# Patient Record
Sex: Male | Born: 1950 | Race: White | Hispanic: No | Marital: Married | Smoking: Former smoker
Health system: Southern US, Community
[De-identification: ages and names within clinical notes are randomized; demographics above are authoritative.]

## PROBLEM LIST (undated history)

## (undated) DIAGNOSIS — I10 Essential (primary) hypertension: Secondary | ICD-10-CM

## (undated) DIAGNOSIS — D509 Iron deficiency anemia, unspecified: Secondary | ICD-10-CM

## (undated) DIAGNOSIS — E039 Hypothyroidism, unspecified: Secondary | ICD-10-CM

## (undated) DIAGNOSIS — M545 Low back pain, unspecified: Secondary | ICD-10-CM

## (undated) DIAGNOSIS — H269 Unspecified cataract: Secondary | ICD-10-CM

## (undated) DIAGNOSIS — M810 Age-related osteoporosis without current pathological fracture: Secondary | ICD-10-CM

## (undated) DIAGNOSIS — G4733 Obstructive sleep apnea (adult) (pediatric): Secondary | ICD-10-CM

## (undated) DIAGNOSIS — E079 Disorder of thyroid, unspecified: Secondary | ICD-10-CM

## (undated) DIAGNOSIS — E669 Obesity, unspecified: Secondary | ICD-10-CM

## (undated) DIAGNOSIS — G43909 Migraine, unspecified, not intractable, without status migrainosus: Secondary | ICD-10-CM

## (undated) DIAGNOSIS — T7840XA Allergy, unspecified, initial encounter: Secondary | ICD-10-CM

## (undated) DIAGNOSIS — E559 Vitamin D deficiency, unspecified: Secondary | ICD-10-CM

## (undated) DIAGNOSIS — E785 Hyperlipidemia, unspecified: Secondary | ICD-10-CM

## (undated) HISTORY — DX: Migraine, unspecified, not intractable, without status migrainosus: G43.909

## (undated) HISTORY — DX: Obesity, unspecified: E66.9

## (undated) HISTORY — DX: Disorder of thyroid, unspecified: E07.9

## (undated) HISTORY — DX: Low back pain, unspecified: M54.50

## (undated) HISTORY — PX: BACK SURGERY: SHX140

## (undated) HISTORY — DX: Vitamin D deficiency, unspecified: E55.9

## (undated) HISTORY — DX: Allergy, unspecified, initial encounter: T78.40XA

## (undated) HISTORY — DX: Low back pain: M54.5

## (undated) HISTORY — DX: Age-related osteoporosis without current pathological fracture: M81.0

## (undated) HISTORY — DX: Unspecified cataract: H26.9

## (undated) HISTORY — DX: Essential (primary) hypertension: I10

## (undated) HISTORY — DX: Hyperlipidemia, unspecified: E78.5

## (undated) HISTORY — DX: Iron deficiency anemia, unspecified: D50.9

---

## 1983-06-19 HISTORY — PX: LAMINECTOMY: SHX219

## 1983-06-19 HISTORY — PX: SPINE SURGERY: SHX786

## 2005-06-14 ENCOUNTER — Ambulatory Visit: Payer: Self-pay | Admitting: Specialist

## 2005-06-14 IMAGING — CR DG ABDOMEN 1V
1 series · 1 of 1 positions shown · non-contrast
Comparison: none

REASON FOR EXAM: Renal calculi-lithotripsy
COMMENTS:

[view not recorded]
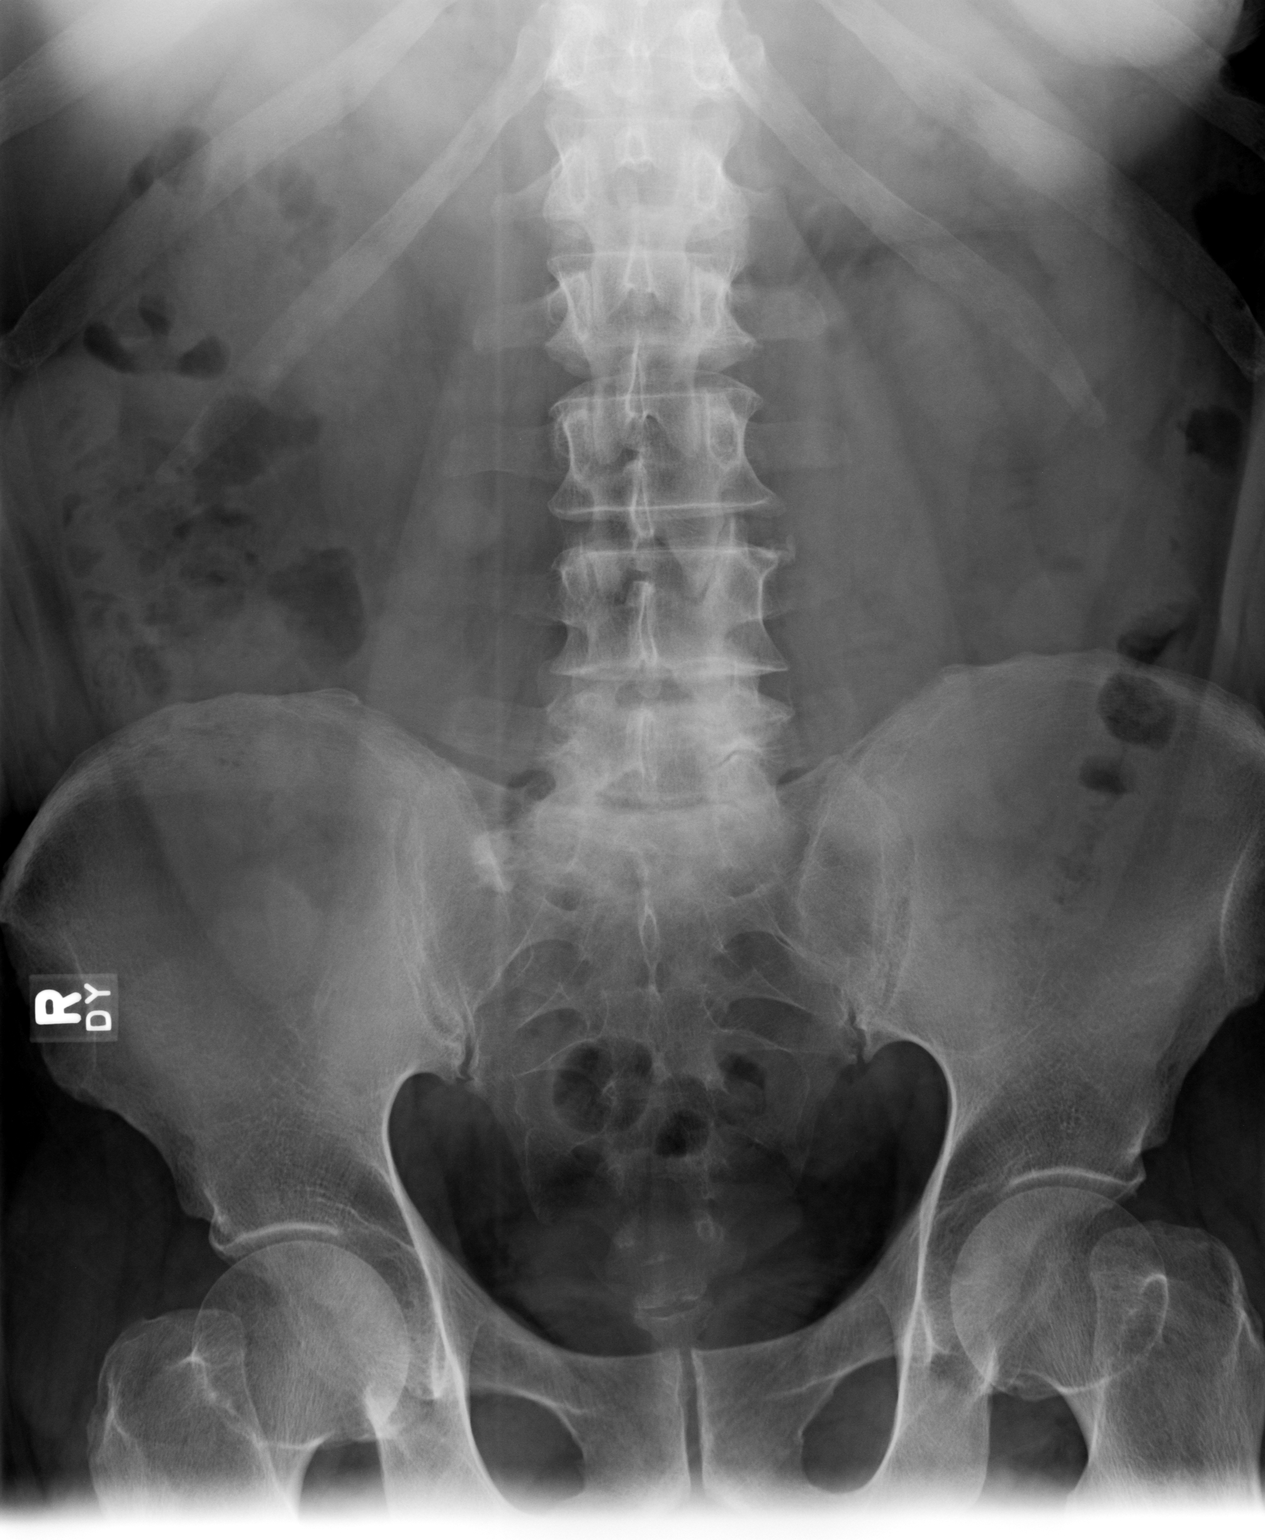

[1 of 1 positions shown; findings below may reference images not displayed]

PROCEDURE:     DXR - DXR KIDNEY URETER BLADDER  - [DATE]  [DATE]

RESULT:     There are no prior radiographs available for comparison.

No definite renal calcifications are seen. There is a 1.6 cm density
projected over the sacrum on the RIGHT. On the basis of this exam alone, it
is uncertain as to whether this represents a distal RIGHT ureteral stone or
a sclerotic density in the sacrum.
IMPRESSION: Possible distal RIGHT ureteral stone versus a sclerotic
density in the RIGHT sacrum.

## 2006-04-12 ENCOUNTER — Ambulatory Visit: Payer: Self-pay | Admitting: Gastroenterology

## 2006-07-22 ENCOUNTER — Ambulatory Visit: Payer: Self-pay | Admitting: Specialist

## 2006-07-22 IMAGING — CT CT STONE STUDY
1 of 2 series · 15 of 32 positions shown, 19 images · non-contrast
Comparison: none

REASON FOR EXAM: history kidney stones f/u lithotripsy  send copy of disc
with patient
COMMENTS:

[Series 2: stone · axial · 0.77mm/px · z∈[-418,+5]mm · 15 of 159 slices shown, 19 images]
[im 12/159  soft-tissue]
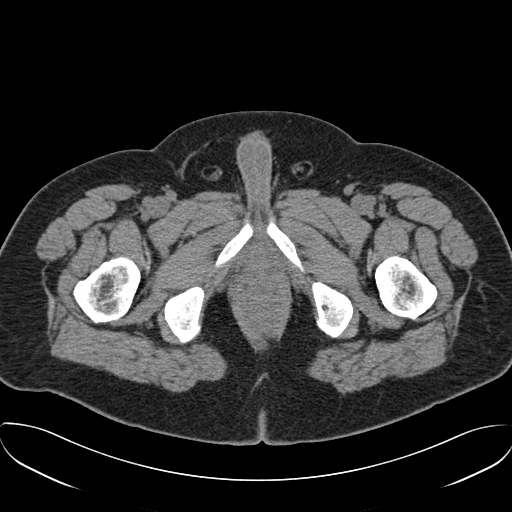
[im 12/159  bone]
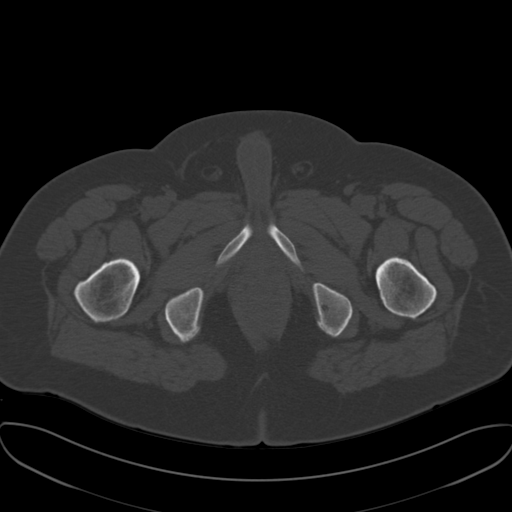
[im 23/159  soft-tissue]
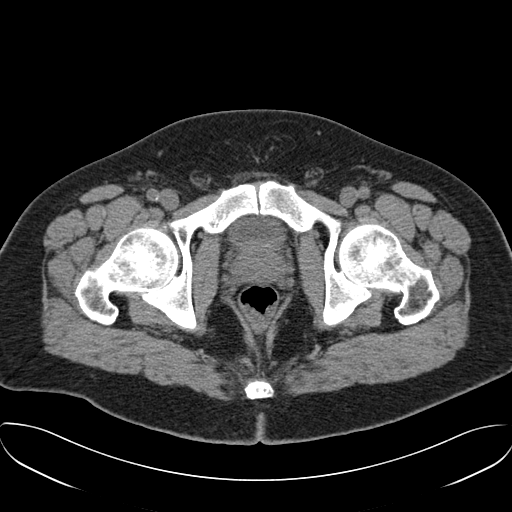
[im 34/159  soft-tissue]
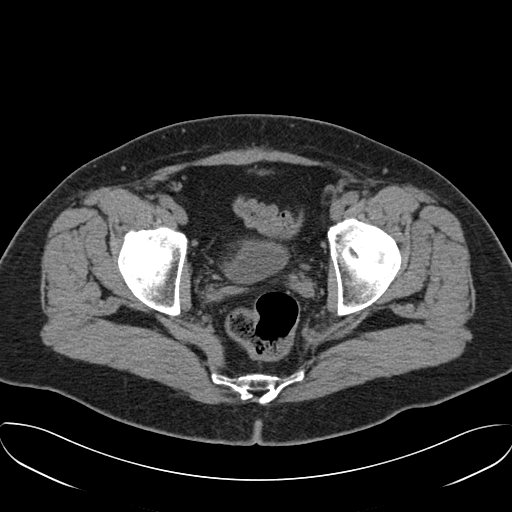
[im 46/159  soft-tissue]
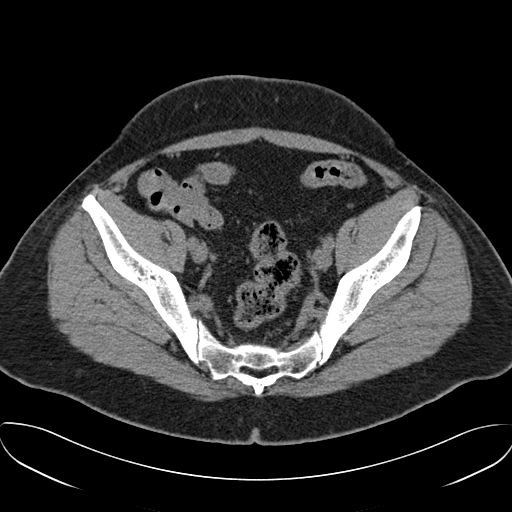
[im 57/159  soft-tissue]
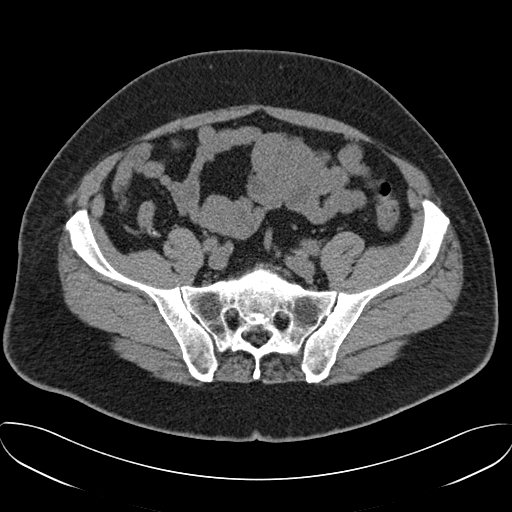
[im 68/159  soft-tissue]
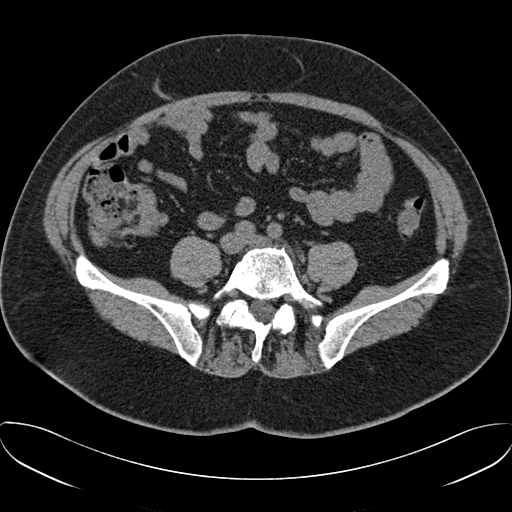
[im 80/159  soft-tissue]
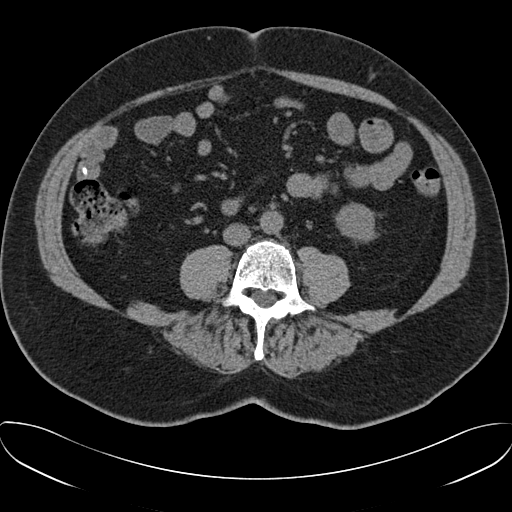
[im 91/159  soft-tissue]
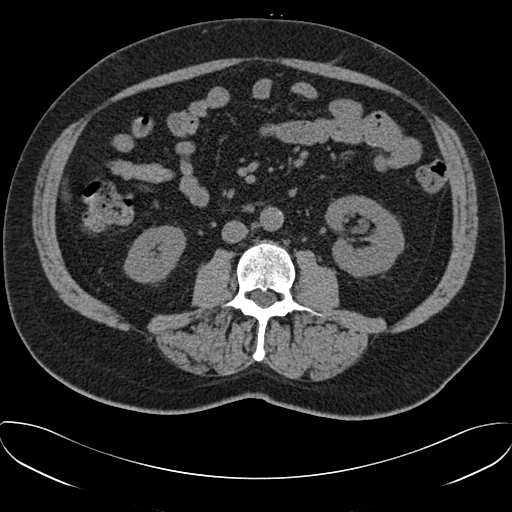
[im 102/159  soft-tissue]
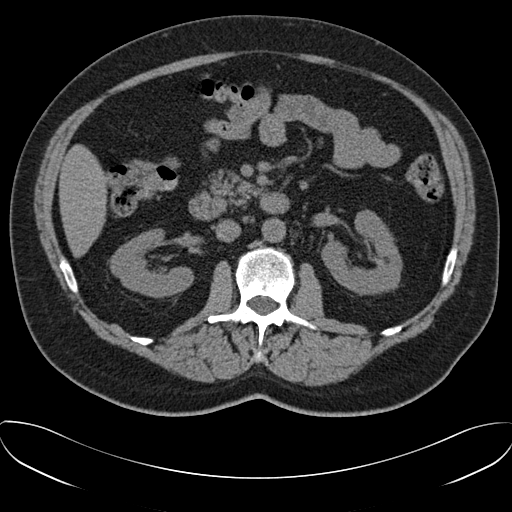
[im 102/159  bone]
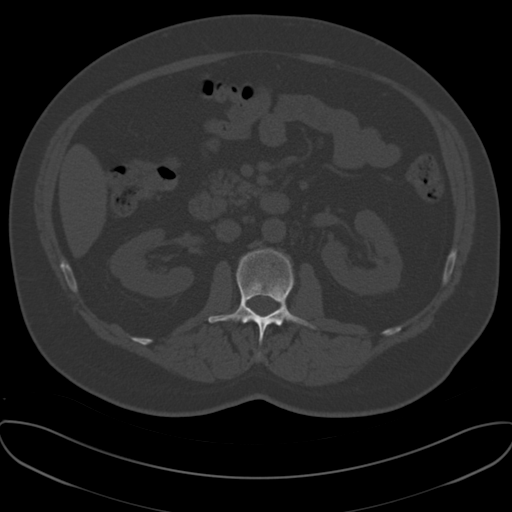
[im 113/159  soft-tissue]
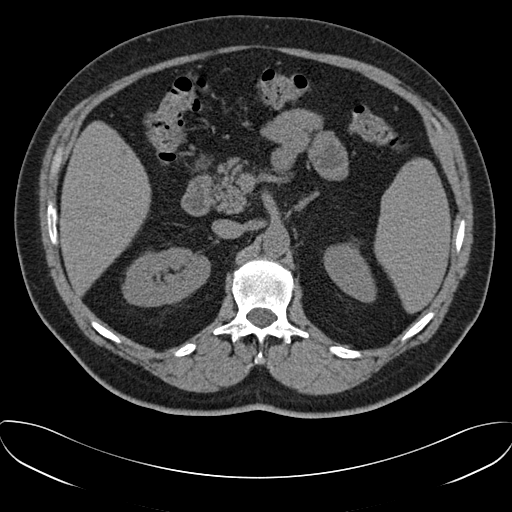
[im 125/159  soft-tissue]
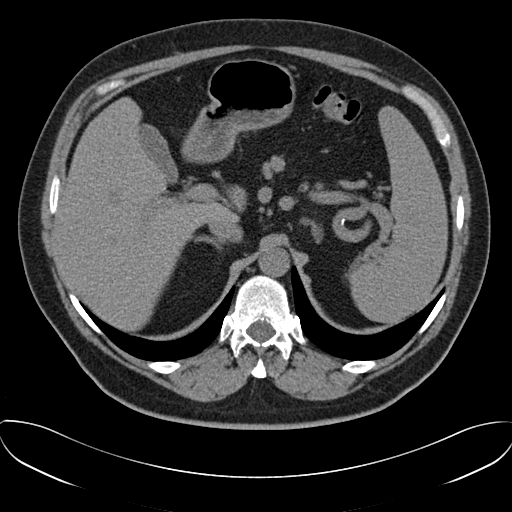
[im 136/159  soft-tissue]
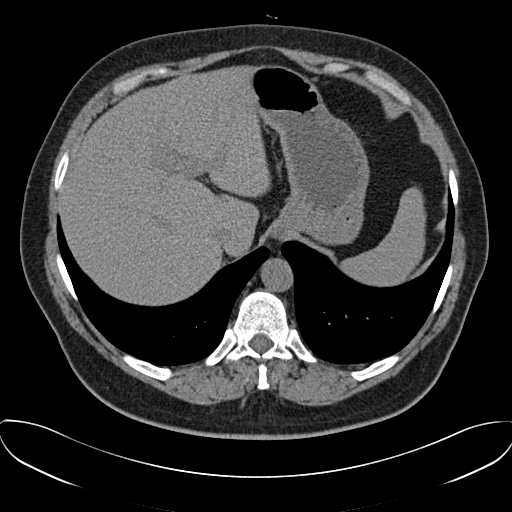
[im 136/159  lung]
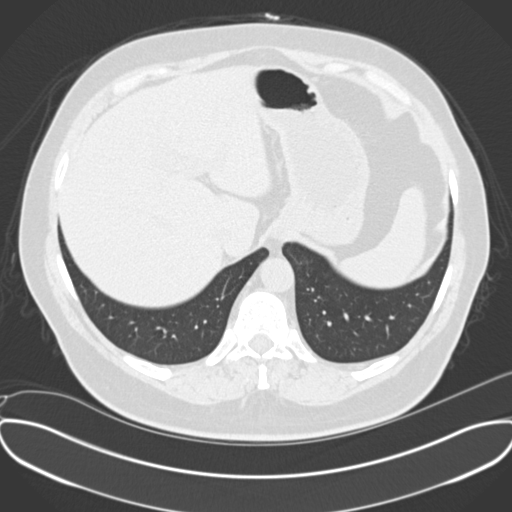
[im 142/159  lung]
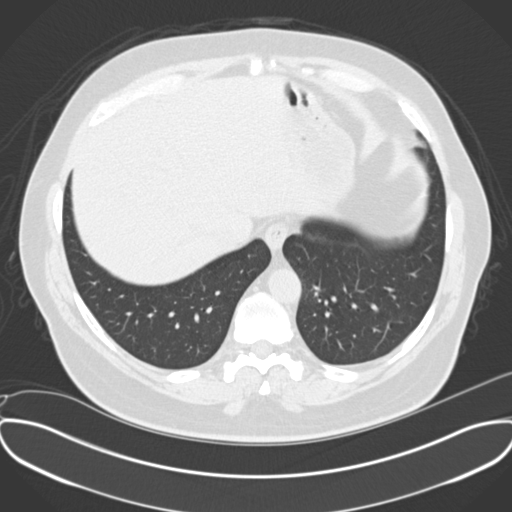
[im 147/159  soft-tissue]
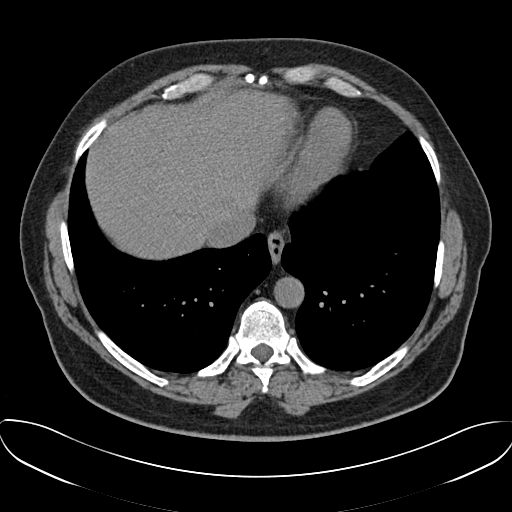
[im 147/159  lung]
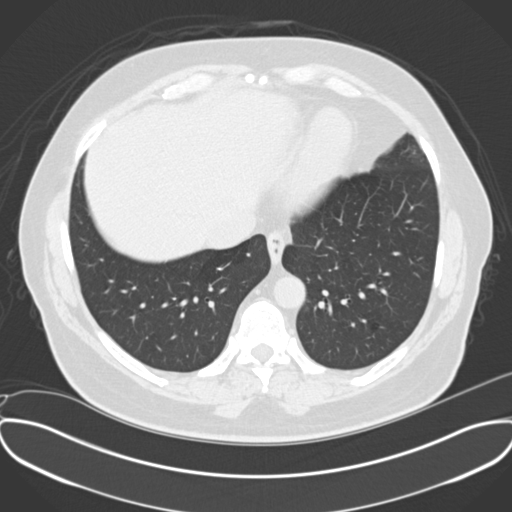
[im 153/159  lung]
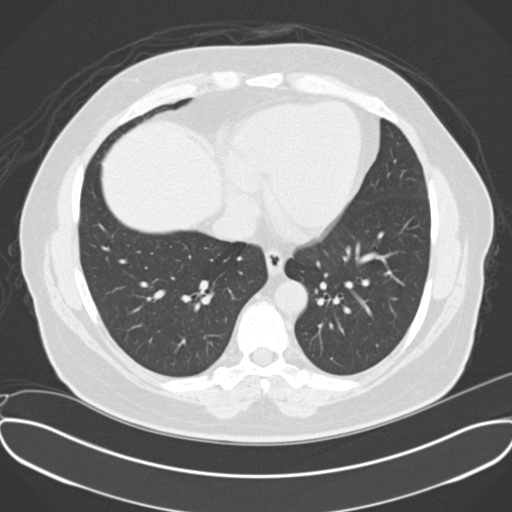

[15 of 32 positions shown; findings below may reference images not displayed]

PROCEDURE:     CT  - CT ABDOMEN /PELVIS WO (STONE)  - [DATE]  [DATE]

RESULT:     The patient has a history of urinary tract stones and has
undergone lithotripsy.

This non-contrast study reveals the kidneys to be normal in density and
contour.  I see no calcified stones.  On the LEFT in the mid pole there is
an approximately 7 mm diameter hypodensity with Hounsfield measurement of
-89 most compatible with an angiomyolipoma.  The perinephric fat is normal
in appearance. I see no calcified stones within either kidney and no
evidence of hydronephrosis.  There is no hydroureter either.  There is a
faint calcification noted at the LEFT ureterovesical junction, which may
reflect Steinstrasse, but no obstruction is identified. The partially
distended urinary bladder is normal in appearance. The RIGHT UVJ appears
normal. There is no free fluid in the pelvis and the sigmoid colon and
seminal vesicles appeared normal.  The prostate gland produces a very
minimal impression upon the urinary bladder base.

The unopacified loops of small and large bowel are normal in appearance. The
caliber of the abdominal aorta is normal.  The adrenal glands, spleen,
stomach, pancreas, and liver are normal in appearance. The gallbladder is
only partially distended.  There is likely a tiny cyst inferoanteriorly in
the RIGHT lobe of the liver measuring approximately 8 mm in diameter.  It
has a Hounsfield measurement of -5.  The lung bases are clear.
IMPRESSION: 1)There is calcification in the region of the LEFT ureterovesical junction
that likely reflects Steinstrasse, or other very tiny sub mm stones.

2)There is no evidence of urinary tract obstruction and I see no stones
within either kidney currently.

3)There is likely a benign fatty containing lesion in the mid pole of the
LEFT kidney consistent with an angiomyolipoma.

4)I see no acute abnormality elsewhere within the abdomen or pelvis.

## 2007-05-05 DIAGNOSIS — M810 Age-related osteoporosis without current pathological fracture: Secondary | ICD-10-CM

## 2007-08-08 ENCOUNTER — Ambulatory Visit: Payer: Self-pay | Admitting: Specialist

## 2007-08-08 IMAGING — CT CT STONE STUDY
1 of 2 series · 16 of 32 positions shown, 20 images · non-contrast
Comparison: none

REASON FOR EXAM: f/u for kidney stones
COMMENTS:

PROCEDURE:     CT  - CT ABDOMEN /PELVIS WO (STONE)  - [DATE]  [DATE]
RESULT:
HISTORY: Stone disease.

[Series 2: stone · axial · 0.77mm/px · z∈[-974,-526]mm · 16 of 161 slices shown, 20 images]
[im 6/161  soft-tissue]
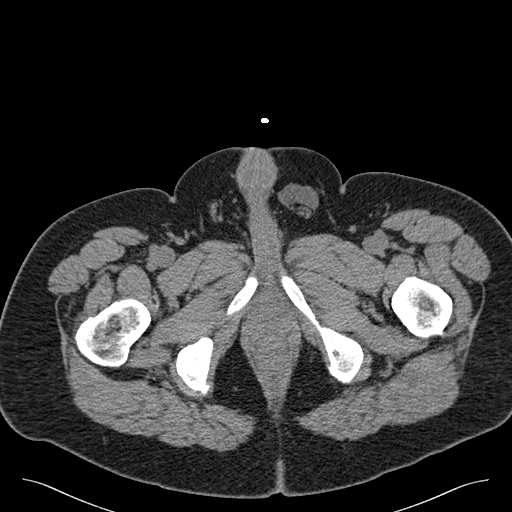
[im 6/161  bone]
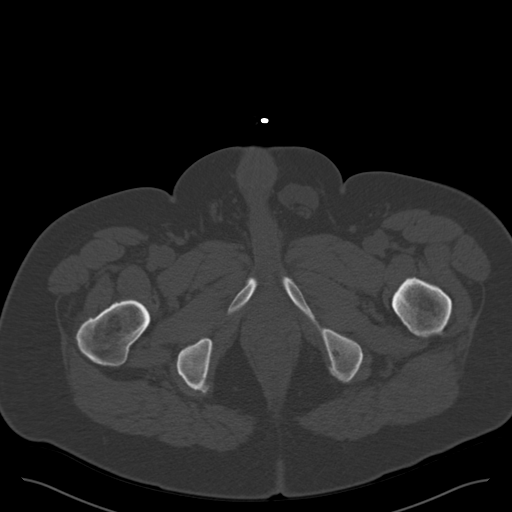
[im 18/161  soft-tissue]
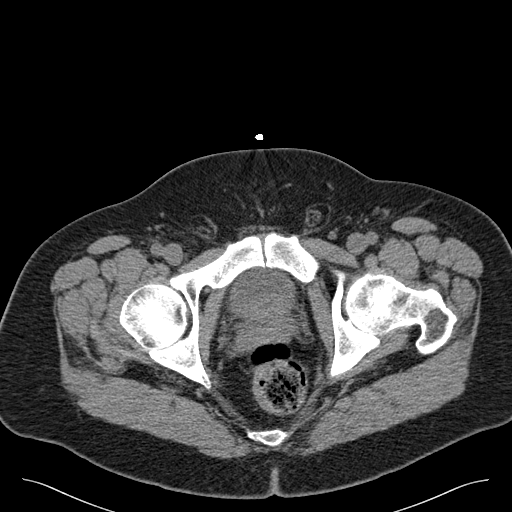
[im 29/161  soft-tissue]
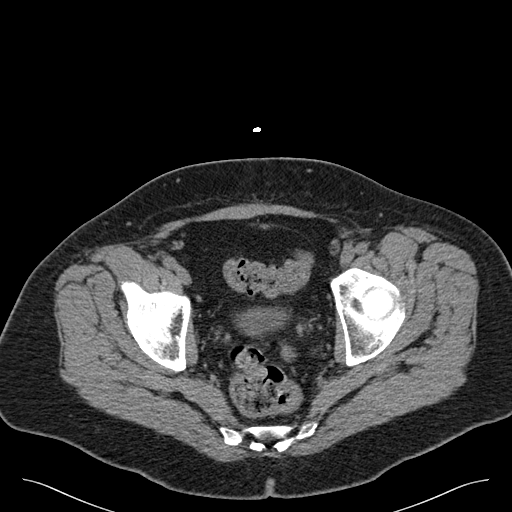
[im 41/161  soft-tissue]
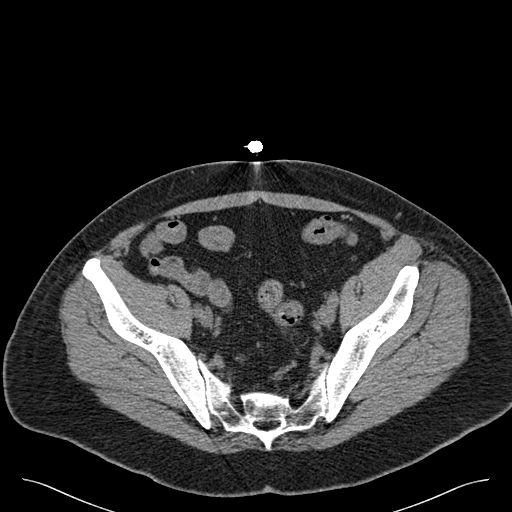
[im 52/161  soft-tissue]
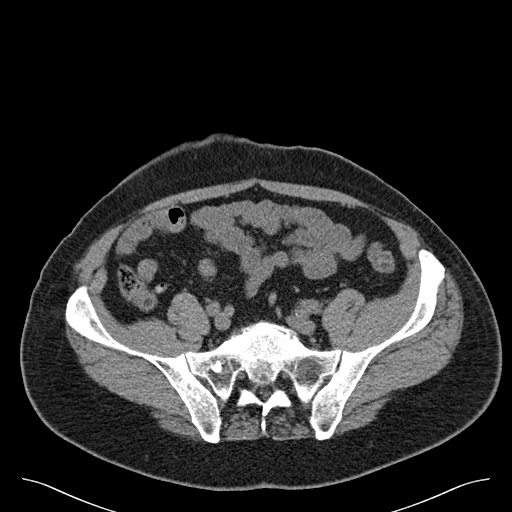
[im 63/161  soft-tissue]
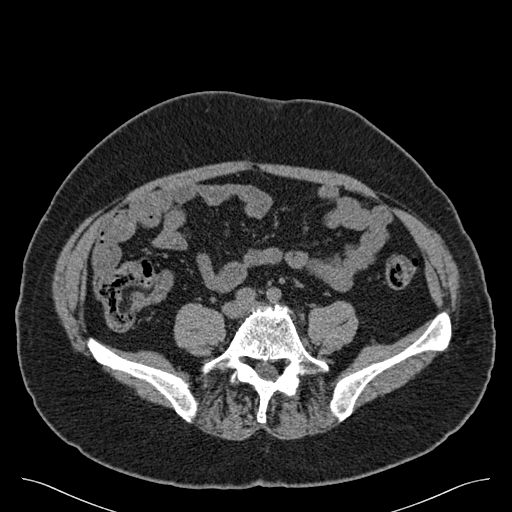
[im 75/161  soft-tissue]
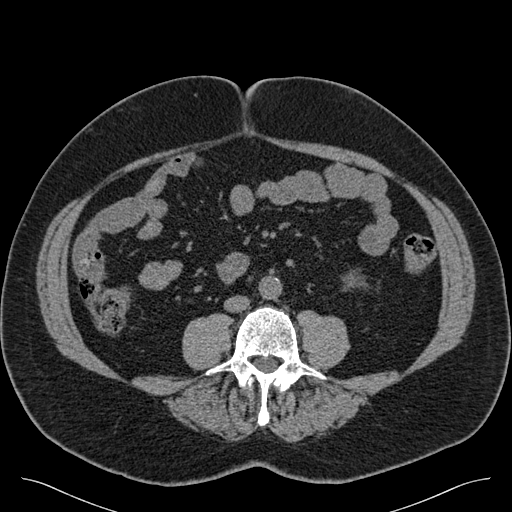
[im 86/161  soft-tissue]
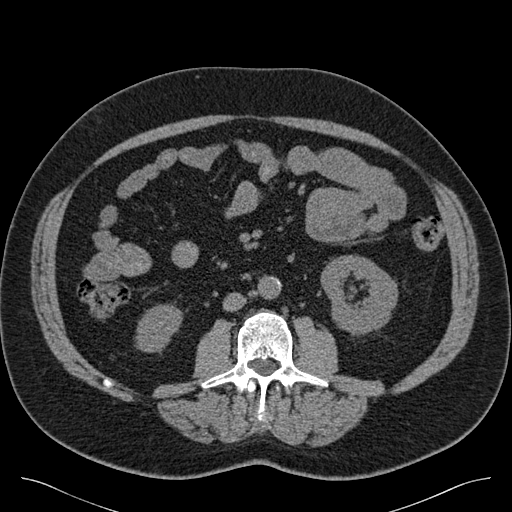
[im 98/161  soft-tissue]
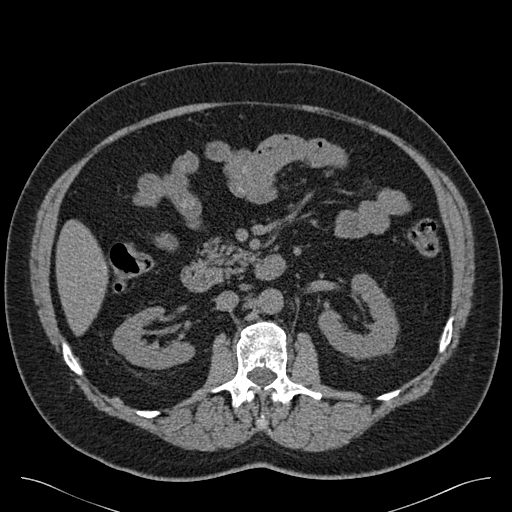
[im 98/161  bone]
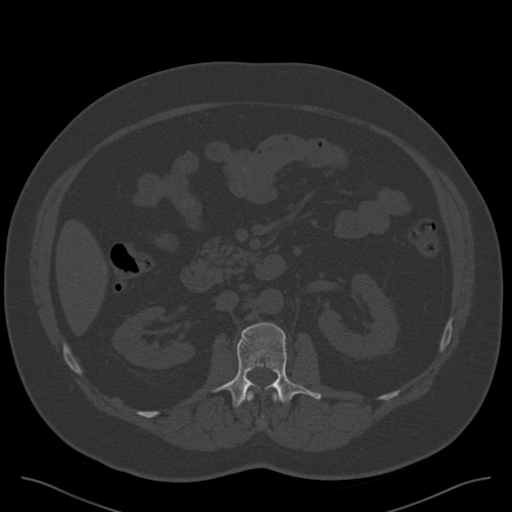
[im 109/161  soft-tissue]
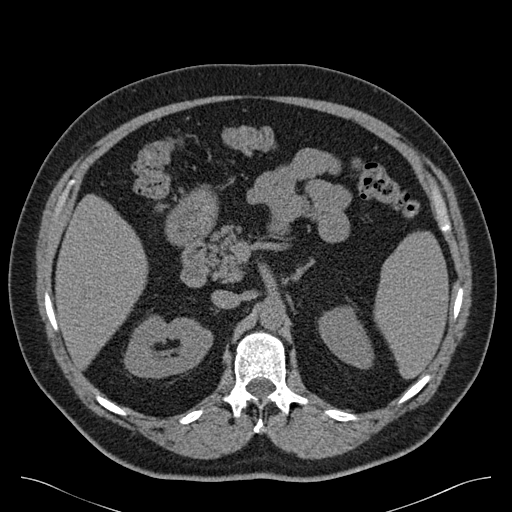
[im 121/161  soft-tissue]
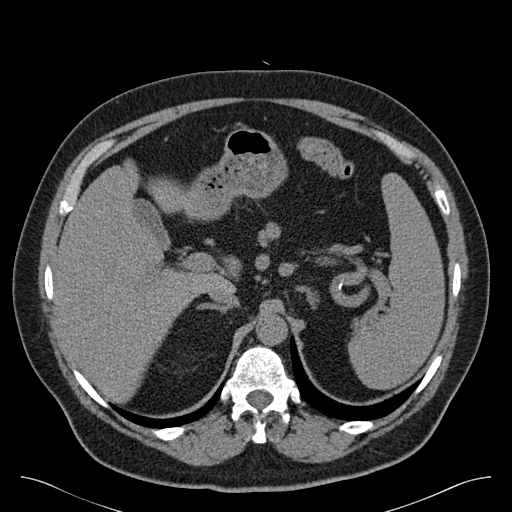
[im 132/161  soft-tissue]
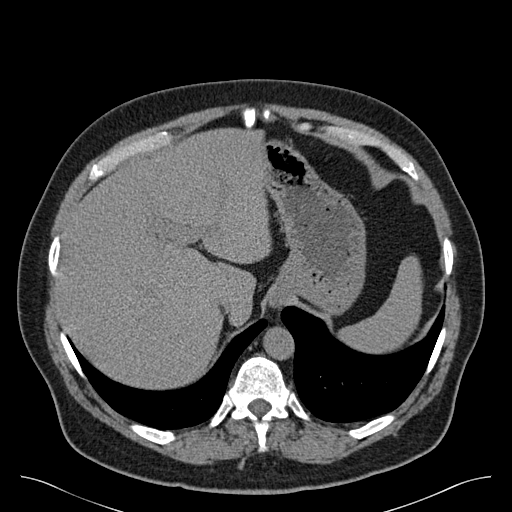
[im 138/161  lung]
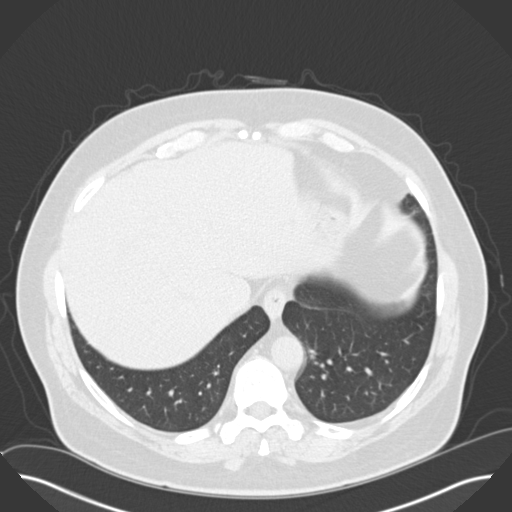
[im 143/161  soft-tissue]
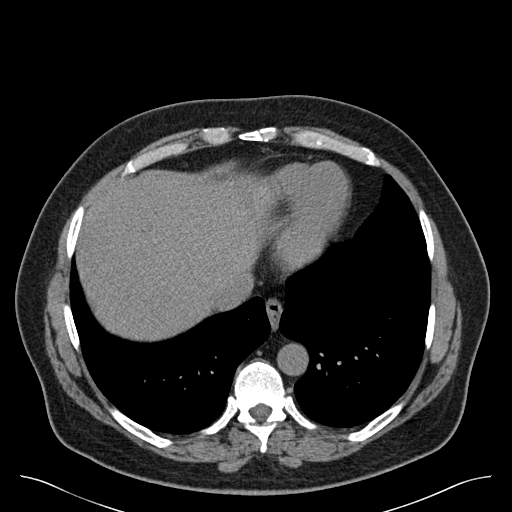
[im 143/161  lung]
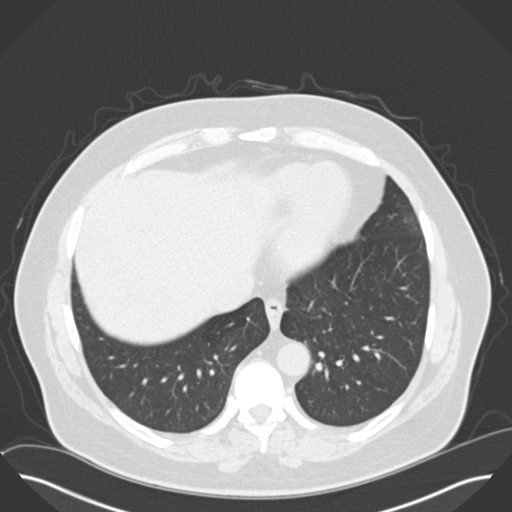
[im 149/161  lung]
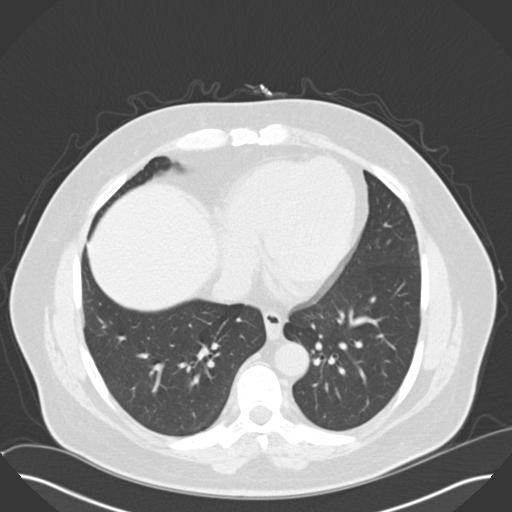
[im 155/161  soft-tissue]
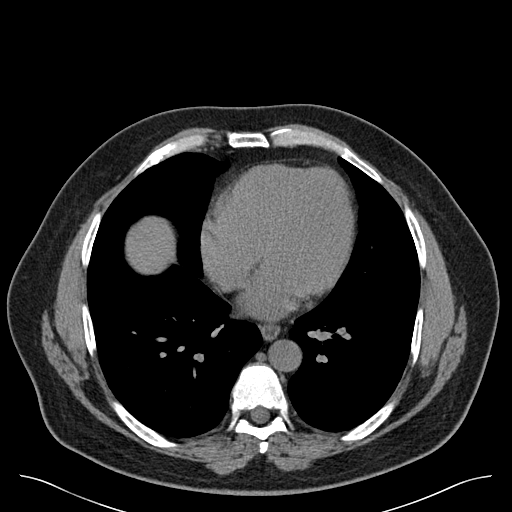
[im 155/161  lung]
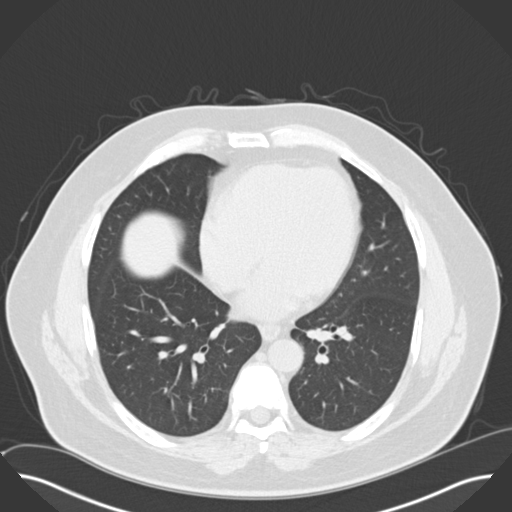

[16 of 32 positions shown; findings below may reference images not displayed]

COMPARISON STUDIES:  CT of [DATE].

Liver and spleen are unremarkable.  A tiny cyst is noted in the LEFT lobe of
the liver.  Pancreas is normal.  The adrenals are normal.  Tiny
angiomyolipoma is noted in the LEFT kidney.  Previously-questioned
steinstrasse in the LEFT distal ureter/ureterovesical junction is less
evident on today's examination.  There is no evidence of ureteral
distention.  Bladder is normal.  No inguinal adenopathy noted.  Prostate is
mildly prominent.
IMPRESSION: 1.     Previously-identified steinstrasse in the distal LEFT ureter is less
evident on today's examination.  Stone fragments may have passed.  There is
no evidence of ureteral obstruction.
2.     Tiny angiomyolipoma, LEFT kidney.

## 2007-09-12 DIAGNOSIS — R809 Proteinuria, unspecified: Secondary | ICD-10-CM | POA: Insufficient documentation

## 2009-04-14 DIAGNOSIS — E559 Vitamin D deficiency, unspecified: Secondary | ICD-10-CM | POA: Insufficient documentation

## 2010-04-03 DIAGNOSIS — M545 Low back pain, unspecified: Secondary | ICD-10-CM | POA: Insufficient documentation

## 2010-04-24 DIAGNOSIS — D239 Other benign neoplasm of skin, unspecified: Secondary | ICD-10-CM

## 2010-04-24 HISTORY — DX: Other benign neoplasm of skin, unspecified: D23.9

## 2011-08-15 ENCOUNTER — Ambulatory Visit: Payer: Self-pay | Admitting: Gastroenterology

## 2011-08-16 LAB — HM COLONOSCOPY: HM Colonoscopy: NORMAL

## 2013-09-14 LAB — HM DEXA SCAN

## 2014-04-06 LAB — LIPID PANEL
CHOLESTEROL: 145 mg/dL (ref 0–200)
HDL: 45 mg/dL (ref 35–70)
LDL Cholesterol: 87 mg/dL
TRIGLYCERIDES: 64 mg/dL (ref 40–160)

## 2014-04-06 LAB — HEMOGLOBIN A1C: Hgb A1c MFr Bld: 5.1 % (ref 4.0–6.0)

## 2014-05-03 LAB — PSA: PSA: 0.7

## 2014-06-18 HISTORY — PX: EYE SURGERY: SHX253

## 2014-12-03 ENCOUNTER — Encounter: Payer: Self-pay | Admitting: Family Medicine

## 2014-12-03 DIAGNOSIS — M222X9 Patellofemoral disorders, unspecified knee: Secondary | ICD-10-CM | POA: Insufficient documentation

## 2014-12-04 ENCOUNTER — Other Ambulatory Visit: Payer: Self-pay | Admitting: Family Medicine

## 2014-12-21 ENCOUNTER — Other Ambulatory Visit: Payer: Self-pay | Admitting: Family Medicine

## 2014-12-30 ENCOUNTER — Encounter: Payer: Self-pay | Admitting: Family Medicine

## 2014-12-30 DIAGNOSIS — G43909 Migraine, unspecified, not intractable, without status migrainosus: Secondary | ICD-10-CM | POA: Insufficient documentation

## 2014-12-30 DIAGNOSIS — R7989 Other specified abnormal findings of blood chemistry: Secondary | ICD-10-CM | POA: Insufficient documentation

## 2014-12-30 DIAGNOSIS — R945 Abnormal results of liver function studies: Secondary | ICD-10-CM | POA: Insufficient documentation

## 2014-12-30 DIAGNOSIS — E291 Testicular hypofunction: Secondary | ICD-10-CM | POA: Insufficient documentation

## 2014-12-30 DIAGNOSIS — J3089 Other allergic rhinitis: Secondary | ICD-10-CM

## 2014-12-30 DIAGNOSIS — E785 Hyperlipidemia, unspecified: Secondary | ICD-10-CM | POA: Insufficient documentation

## 2014-12-30 DIAGNOSIS — I10 Essential (primary) hypertension: Secondary | ICD-10-CM | POA: Insufficient documentation

## 2014-12-30 DIAGNOSIS — M1990 Unspecified osteoarthritis, unspecified site: Secondary | ICD-10-CM | POA: Insufficient documentation

## 2014-12-30 DIAGNOSIS — E039 Hypothyroidism, unspecified: Secondary | ICD-10-CM | POA: Insufficient documentation

## 2014-12-30 DIAGNOSIS — J302 Other seasonal allergic rhinitis: Secondary | ICD-10-CM | POA: Insufficient documentation

## 2014-12-30 DIAGNOSIS — E669 Obesity, unspecified: Secondary | ICD-10-CM | POA: Insufficient documentation

## 2014-12-30 DIAGNOSIS — E8881 Metabolic syndrome: Secondary | ICD-10-CM | POA: Insufficient documentation

## 2014-12-31 ENCOUNTER — Encounter: Payer: Self-pay | Admitting: Family Medicine

## 2014-12-31 ENCOUNTER — Ambulatory Visit (INDEPENDENT_AMBULATORY_CARE_PROVIDER_SITE_OTHER): Payer: Managed Care, Other (non HMO) | Admitting: Family Medicine

## 2014-12-31 VITALS — BP 128/62 | HR 73 | Temp 98.6°F | Resp 18 | Ht 70.5 in | Wt 237.1 lb

## 2014-12-31 DIAGNOSIS — E8881 Metabolic syndrome: Secondary | ICD-10-CM | POA: Diagnosis not present

## 2014-12-31 DIAGNOSIS — E039 Hypothyroidism, unspecified: Secondary | ICD-10-CM | POA: Diagnosis not present

## 2014-12-31 DIAGNOSIS — E291 Testicular hypofunction: Secondary | ICD-10-CM

## 2014-12-31 DIAGNOSIS — E785 Hyperlipidemia, unspecified: Secondary | ICD-10-CM | POA: Diagnosis not present

## 2014-12-31 DIAGNOSIS — I10 Essential (primary) hypertension: Secondary | ICD-10-CM

## 2014-12-31 MED ORDER — METFORMIN HCL 500 MG PO TABS: 500.0000 mg | ORAL_TABLET | Freq: Every day | ORAL | Status: DC

## 2014-12-31 MED ORDER — TESTOSTERONE 20.25 MG/ACT (1.62%) TD GEL: 2.0000 | Freq: Every day | TRANSDERMAL | Status: DC

## 2014-12-31 MED ORDER — TRIAMTERENE-HCTZ 37.5-25 MG PO TABS: 1.0000 | ORAL_TABLET | Freq: Every day | ORAL | Status: DC

## 2014-12-31 MED ORDER — VERAPAMIL HCL ER 300 MG PO CP24: 1.0000 | ORAL_CAPSULE | Freq: Every day | ORAL | Status: DC

## 2014-12-31 MED ORDER — ROSUVASTATIN CALCIUM 20 MG PO TABS: 20.0000 mg | ORAL_TABLET | Freq: Every day | ORAL | Status: DC

## 2014-12-31 MED ORDER — AMLODIPINE-OLMESARTAN 5-40 MG PO TABS: 1.0000 | ORAL_TABLET | Freq: Every day | ORAL | Status: DC

## 2014-12-31 NOTE — Progress Notes (Signed)
Name: Alan Campbell   [name]: [id number]    DOB: 13-Dec-1950   Date:12/31/2014       Progress Note  Subjective  Chief Complaint  Chief Complaint  Patient presents with  . Medication Refill    6 month F/U  . Hypertension  . Hyperlipidemia  . Hypothyroidism  . Metabolic Syndrome    Trying to watch what patient eats    HPI  Hypothyroidism: patient has been taking Synthroid 200 mcg one daily Monday through Saturdays and taking half pill on Sunday. Denies palpitation, constipation, or dry skin  HTN: he is compliant with medication, he continues to have some lower extremity edema.  BP has been at goal, denies chest pain or SOB  Hyperlipidemia: taking Crestor and denies side effects.   Metabolic Syndrome: HYQM5H is at goal, taking only one Metformin daily , had diarrhea with two daily medication  Patient Active Problem List   Diagnosis Date Noted  . Benign essential HTN 12/30/2014  . Dyslipidemia 12/30/2014  . Adult hypothyroidism 12/30/2014  . Dysmetabolic syndrome 84/69/6295  . Headache, migraine 12/30/2014  . Obesity (BMI 30-39.9) 12/30/2014  . Arthritis, degenerative 12/30/2014  . Allergic rhinitis 12/30/2014  . 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  . Patella-femoral syndrome 12/03/2014  . Low back pain 04/03/2010  . Vitamin D deficiency 04/14/2009  . Abnormal presence of protein in urine 09/12/2007  . OP (osteoporosis) 05/05/2007    Past Surgical History  Procedure Laterality Date  . Laminectomy  1985    L-5    Family History  Problem Relation Age of Onset  . Heart disease Father   . Hypothyroidism Sister   . Hypothyroidism Sister   . Migraines Sister     History   Social History  . Marital Status: Married    Spouse Name: N/A  . Number of Children: N/A  . Years of Education: N/A   Occupational History  . Not on file.   Social History Main Topics  . Smoking status: Never Smoker   . Smokeless tobacco: Never Used  . Alcohol Use: 2.4 oz/week    4 Standard  drinks or equivalent per week  . Drug Use: No  . Sexual Activity:    Partners: Female   Other Topics Concern  . Not on file   Social History Narrative     Current outpatient prescriptions:  .  amLODipine-olmesartan (AZOR) 5-40 MG per tablet, Take 1 tablet by mouth daily., Disp: 30 tablet, Rfl: 5 .  aspirin 81 MG chewable tablet, Chew 1 tablet by mouth daily., Disp: , Rfl:  .  metFORMIN (GLUCOPHAGE) 500 MG tablet, Take 1 tablet (500 mg total) by mouth daily., Disp: 30 tablet, Rfl: 5 .  Multiple Vitamins-Iron (MULTIPLE VITAMIN/IRON PO), Take 1 tablet by mouth daily., Disp: , Rfl:  .  OMEGA-3 FATTY ACIDS PO, Take 1 tablet by mouth daily., Disp: , Rfl:  .  rosuvastatin (CRESTOR) 20 MG tablet, Take 1 tablet (20 mg total) by mouth daily., Disp: 30 tablet, Rfl: 5 .  SYNTHROID 200 MCG tablet, TAKE 1 AND 1/2 TABLETS BY MOUTH ON SUNDAY, AND 1 TABLET DAILY ON ALL OTHER DAYS., Disp: 32 tablet, Rfl: 0 .  Testosterone ([name] PUMP) 20.25 MG/ACT (1.62%) GEL, Apply 2 Squirts topically daily., Disp: 150 g, Rfl: 5 .  triamterene-hydrochlorothiazide (MAXZIDE-25) 37.5-25 MG per tablet, Take 1 tablet by mouth daily., Disp: 30 tablet, Rfl: 5 .  Verapamil HCl CR 300 MG CP24, Take 1 capsule (300 mg total) by mouth daily., Disp: 30 each,  Rfl: 5  No Known Allergies   ROS  Constitutional: Negative for fever or significant weight change.  Respiratory: Negative for cough and shortness of breath.   Cardiovascular: Negative for chest pain or palpitations.  Gastrointestinal: Negative for abdominal pain, no bowel changes.  Musculoskeletal: Negative for gait problem or joint swelling.  Skin: Negative for rash.  Neurological: Negative for dizziness or headache.  No other specific complaints in a complete review of systems (except as listed in HPI above).  Objective  Filed Vitals:   12/31/14 1639  BP: 128/62  Pulse: 73  Temp: 98.6 F (37 C)  TempSrc: Oral  Resp: 18  Height: 5' 10.5" (1.791 m)  Weight:  237 lb 1.6 oz (107.548 kg)  SpO2: 96%    Body mass index is 33.53 kg/(m^2).  Physical Exam  Constitutional: Patient appears well-developed and well-nourished. Obese Yes No distress.  Eyes:  No scleral icterus. PERL Neck: Normal range of motion. Neck supple. Cardiovascular: Normal rate, regular rhythm and normal heart sounds.  No murmur heard. He has  BLE edema. Pulmonary/Chest: Effort normal and breath sounds normal. No respiratory distress. Abdominal: Soft.  There is no tenderness. Psychiatric: Patient has a normal mood and affect. behavior is normal. Judgment and thought content normal.    PHQ2/9: Depression screen PHQ 2/9 12/31/2014  Decreased Interest 0  Down, Depressed, Hopeless 0  PHQ - 2 Score 0    Fall Risk: Fall Risk  12/31/2014  Falls in the past year? No     Assessment & Plan  1. Hypothyroidism, adult  - TSH  2. Benign essential HTN  - Verapamil HCl CR 300 MG CP24; Take 1 capsule (300 mg total) by mouth daily.  Dispense: 30 each; Refill: 5 - triamterene-hydrochlorothiazide (MAXZIDE-25) 37.5-25 MG per tablet; Take 1 tablet by mouth daily.  Dispense: 30 tablet; Refill: 5 - amLODipine-olmesartan (AZOR) 5-40 MG per tablet; Take 1 tablet by mouth daily.  Dispense: 30 tablet; Refill: 5  3. Dyslipidemia  - rosuvastatin (CRESTOR) 20 MG tablet; Take 1 tablet (20 mg total) by mouth daily.  Dispense: 30 tablet; Refill: 5  4. Dysmetabolic syndrome  - metFORMIN (GLUCOPHAGE) 500 MG tablet; Take 1 tablet (500 mg total) by mouth daily.  Dispense: 30 tablet; Refill: 5  5. Testicular hypofunction  - Testosterone ([name] PUMP) 20.25 MG/ACT (1.62%) GEL; Apply 2 Squirts topically daily.  Dispense: 150 g; Refill: 5

## 2015-01-14 LAB — TSH: TSH: 1.55 u[IU]/mL (ref 0.450–4.500)

## 2015-01-16 ENCOUNTER — Other Ambulatory Visit: Payer: Self-pay | Admitting: Family Medicine

## 2015-01-17 ENCOUNTER — Other Ambulatory Visit: Payer: Self-pay | Admitting: Family Medicine

## 2015-01-17 MED ORDER — SYNTHROID 200 MCG PO TABS: 200.0000 ug | ORAL_TABLET | Freq: Every day | ORAL | Status: DC

## 2015-04-07 ENCOUNTER — Encounter: Payer: Self-pay | Admitting: Family Medicine

## 2015-05-06 ENCOUNTER — Ambulatory Visit (INDEPENDENT_AMBULATORY_CARE_PROVIDER_SITE_OTHER): Payer: Managed Care, Other (non HMO) | Admitting: Family Medicine

## 2015-05-06 ENCOUNTER — Encounter: Payer: Self-pay | Admitting: Family Medicine

## 2015-05-06 VITALS — BP 126/84 | HR 68 | Temp 97.9°F | Resp 18 | Ht 70.5 in | Wt 233.0 lb

## 2015-05-06 DIAGNOSIS — Z79899 Other long term (current) drug therapy: Secondary | ICD-10-CM

## 2015-05-06 DIAGNOSIS — E039 Hypothyroidism, unspecified: Secondary | ICD-10-CM

## 2015-05-06 DIAGNOSIS — E8881 Metabolic syndrome: Secondary | ICD-10-CM | POA: Diagnosis not present

## 2015-05-06 DIAGNOSIS — E291 Testicular hypofunction: Secondary | ICD-10-CM | POA: Diagnosis not present

## 2015-05-06 DIAGNOSIS — I10 Essential (primary) hypertension: Secondary | ICD-10-CM | POA: Diagnosis not present

## 2015-05-06 DIAGNOSIS — Z23 Encounter for immunization: Secondary | ICD-10-CM

## 2015-05-06 DIAGNOSIS — Z125 Encounter for screening for malignant neoplasm of prostate: Secondary | ICD-10-CM | POA: Diagnosis not present

## 2015-05-06 DIAGNOSIS — Z0000 Encounter for general adult medical examination without abnormal findings: Secondary | ICD-10-CM

## 2015-05-06 DIAGNOSIS — M810 Age-related osteoporosis without current pathological fracture: Secondary | ICD-10-CM | POA: Diagnosis not present

## 2015-05-06 DIAGNOSIS — E785 Hyperlipidemia, unspecified: Secondary | ICD-10-CM | POA: Diagnosis not present

## 2015-05-06 MED ORDER — TESTOSTERONE 20.25 MG/ACT (1.62%) TD GEL: 2.0000 | Freq: Every day | TRANSDERMAL | Status: DC

## 2015-05-06 MED ORDER — ROSUVASTATIN CALCIUM 20 MG PO TABS: 20.0000 mg | ORAL_TABLET | Freq: Every day | ORAL | Status: DC

## 2015-05-06 MED ORDER — AMLODIPINE-OLMESARTAN 5-40 MG PO TABS: 1.0000 | ORAL_TABLET | Freq: Every day | ORAL | Status: DC

## 2015-05-06 MED ORDER — METFORMIN HCL 500 MG PO TABS: 500.0000 mg | ORAL_TABLET | Freq: Every day | ORAL | Status: DC

## 2015-05-06 MED ORDER — SYNTHROID 200 MCG PO TABS: 200.0000 ug | ORAL_TABLET | Freq: Every day | ORAL | Status: DC

## 2015-05-06 MED ORDER — VERAPAMIL HCL ER 300 MG PO CP24: 1.0000 | ORAL_CAPSULE | Freq: Every day | ORAL | Status: DC

## 2015-05-06 MED ORDER — TRIAMTERENE-HCTZ 37.5-25 MG PO TABS: 1.0000 | ORAL_TABLET | Freq: Every day | ORAL | Status: DC

## 2015-05-06 NOTE — Progress Notes (Signed)
Name: Alan Campbell   [name]: [id number]    DOB: Aug 25, 1950   Date:05/06/2015       Progress Note  Subjective  Chief Complaint  Chief Complaint  Patient presents with  . Annual Exam    HPI  Well male exam: he has been feeling well, still has some right knee pain but seen by Ortho and normal x-rays, taking Advil prn.   Hypogonadism: he is on testosterone, he has not been sexually active for a while - since wife had chemo. He is taking for bone and also improves his energy level  HTN: taking medication, no side effects, no chest pain, no palpitation  Hypothyroidism: still has dry skin, no constipation, taking medication as prescribed  Dyslipidemia: taking Crestor daily, no myalgias  Osteoporosis: took Forteo for 2 years, not on any medication, advised to take Vitamin D otc, high dairy intake per day.    Patient Active Problem List   Diagnosis Date Noted  . Benign essential HTN 12/30/2014  . Dyslipidemia 12/30/2014  . Adult hypothyroidism 12/30/2014  . Dysmetabolic syndrome [id number]  . Headache, migraine 12/30/2014  . Obesity (BMI 30-39.9) 12/30/2014  . Arthritis, degenerative 12/30/2014  . Allergic rhinitis 12/30/2014  . 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  . Patella-femoral syndrome 12/03/2014  . Low back pain 04/03/2010  . Vitamin D deficiency 04/14/2009  . Abnormal presence of protein in urine 09/12/2007  . OP (osteoporosis) 05/05/2007    Past Surgical History  Procedure Laterality Date  . Laminectomy  1985    L-5    Family History  Problem Relation Age of Onset  . Heart disease Father   . Hypothyroidism Sister   . Hypothyroidism Sister   . Migraines Sister     Social History   Social History  . Marital Status: Married    Spouse Name: N/A  . Number of Children: N/A  . Years of Education: N/A   Occupational History  . Not on file.   Social History Main Topics  . Smoking status: Never Smoker   . Smokeless tobacco: Never Used  . Alcohol Use: 2.4  oz/week    4 Standard drinks or equivalent per week  . Drug Use: No  . Sexual Activity:    Partners: Female   Other Topics Concern  . Not on file   Social History Narrative     Current outpatient prescriptions:  .  amLODipine-olmesartan (AZOR) 5-40 MG tablet, Take 1 tablet by mouth daily., Disp: 30 tablet, Rfl: 5 .  aspirin 81 MG chewable tablet, Chew 1 tablet by mouth daily., Disp: , Rfl:  .  metFORMIN (GLUCOPHAGE) 500 MG tablet, Take 1 tablet (500 mg total) by mouth daily., Disp: 30 tablet, Rfl: 5 .  Multiple Vitamins-Iron (MULTIPLE VITAMIN/IRON PO), Take 1 tablet by mouth daily., Disp: , Rfl:  .  OMEGA-3 FATTY ACIDS PO, Take 1 tablet by mouth daily., Disp: , Rfl:  .  rosuvastatin (CRESTOR) 20 MG tablet, Take 1 tablet (20 mg total) by mouth daily., Disp: 30 tablet, Rfl: 5 .  SYNTHROID 200 MCG tablet, Take 1 tablet (200 mcg total) by mouth daily before breakfast. And one and a half pill on Sunday, Disp: 32 tablet, Rfl: 5 .  Testosterone ([name] PUMP) 20.25 MG/ACT (1.62%) GEL, Apply 2 Squirts topically daily., Disp: 150 g, Rfl: 5 .  triamterene-hydrochlorothiazide (MAXZIDE-25) 37.5-25 MG tablet, Take 1 tablet by mouth daily., Disp: 30 tablet, Rfl: 5 .  Verapamil HCl CR 300 MG CP24, Take 1 capsule (300 mg total) by  mouth daily., Disp: 30 each, Rfl: 5  No Known Allergies   ROS  Constitutional: Negative for fever or weight change.  Respiratory: Negative for cough and shortness of breath.   Cardiovascular: Negative for chest pain or palpitations.  Gastrointestinal: Negative for abdominal pain, no bowel changes.  Musculoskeletal: Negative for gait problem or joint swelling.  Skin: Negative for rash.  Neurological: Negative for dizziness or headache.  No other specific complaints in a complete review of systems (except as listed in HPI above).  Objective  Filed Vitals:   05/06/15 1209  BP: 126/84  Pulse: 68  Temp: 97.9 F (36.6 C)  TempSrc: Oral  Resp: 18  Height: 5'  10.5" (1.791 m)  Weight: 233 lb (105.688 kg)  SpO2: 98%    Body mass index is 32.95 kg/(m^2).  Physical Exam  Constitutional: Patient appears well-developed and obese. No distress.  HENT: Head: Normocephalic and atraumatic. Ears: B TMs ok, no erythema or effusion; Nose: Nose normal. Mouth/Throat: Oropharynx is clear and moist. No oropharyngeal exudate.  Eyes: Conjunctivae and EOM are normal. Pupils are equal, round, and reactive to light. No scleral icterus.  Neck: Normal range of motion. Neck supple. No JVD present. No thyromegaly present.  Cardiovascular: Normal rate, regular rhythm and normal heart sounds.  No murmur heard.  BLE edema 1 plus. Pulmonary/Chest: Effort normal and breath sounds normal. No respiratory distress. Abdominal: Soft. Bowel sounds are normal, no distension. There is no tenderness. no masses MALE GENITALIA: Normal testes bilaterally - but has but goes up inguinal cannal, no masses palpated, no hernias, no lesions, no discharge RECTAL: normal prostate, some external hemorrhoid Musculoskeletal: Normal range of motion, no joint effusions. No gross deformities Neurological: he is alert and oriented to person, place, and time. No cranial nerve deficit. Coordination, balance, strength, speech and gait are normal.  Skin: Skin is warm and dry. No rash noted. No erythema.  Psychiatric: Patient has a normal mood and affect. behavior is normal. Judgment and thought content normal.  PHQ2/9: Depression screen [facility] 2/9 05/06/2015 12/31/2014  Decreased Interest 0 0  Down, Depressed, Hopeless 0 0  PHQ - 2 Score 0 0    Fall Risk: Fall Risk  05/06/2015 12/31/2014  Falls in the past year? No No     Functional Status Survey: Is the patient deaf or have difficulty hearing?: No Does the patient have difficulty seeing, even when wearing glasses/contacts?: Yes (glasses) Does the patient have difficulty concentrating, remembering, or making decisions?: No Does the patient have  difficulty walking or climbing stairs?: No Does the patient have difficulty dressing or bathing?: No Does the patient have difficulty doing errands alone such as visiting a doctor's office or shopping?: No    Assessment & Plan  1. Encounter for routine history and physical exam for male  Discussed importance of 150 minutes of physical activity weekly, eat two servings of fish weekly, eat one serving of tree nuts ( cashews, pistachios, pecans, almonds.Marland Kitchen) every other day, eat 6 servings of fruit/vegetables daily and drink plenty of water and avoid sweet beverages.  He already had a flu shot and will send me a copy for our records  2. Prostate cancer screening  - PSA  3. Hypothyroidism, adult  - SYNTHROID 200 MCG tablet; Take 1 tablet (200 mcg total) by mouth daily before breakfast. And one and a half pill on Sunday  Dispense: 32 tablet; Refill: 5 - TSH  4. Benign essential HTN  - Verapamil HCl CR 300 MG CP24; Take  1 capsule (300 mg total) by mouth daily.  Dispense: 30 each; Refill: 5 - triamterene-hydrochlorothiazide (MAXZIDE-25) 37.5-25 MG tablet; Take 1 tablet by mouth daily.  Dispense: 30 tablet; Refill: 5 - amLODipine-olmesartan (AZOR) 5-40 MG tablet; Take 1 tablet by mouth daily.  Dispense: 30 tablet; Refill: 5 - Comprehensive metabolic panel  5. Dyslipidemia  - rosuvastatin (CRESTOR) 20 MG tablet; Take 1 tablet (20 mg total) by mouth daily.  Dispense: 30 tablet; Refill: 5 - Lipid panel  6. Need for pneumococcal vaccination  - Pneumococcal polysaccharide vaccine 23-valent greater than or equal to 2yo subcutaneous/IM  7. Testicular hypofunction  - Testosterone ([name] PUMP) 20.25 MG/ACT (1.62%) GEL; Apply 2 Squirts topically daily.  Dispense: 150 g; Refill: 5 - Testosterone  8. Dysmetabolic syndrome  - metFORMIN (GLUCOPHAGE) 500 MG tablet; Take 1 tablet (500 mg total) by mouth daily.  Dispense: 30 tablet; Refill: 5 - Hemoglobin A1c  9. Osteoporosis  - VITAMIN D  25 Hydroxy (Vit-D Deficiency, Fractures)  10. Long-term use of high-risk medication  - CBC with Differential/Platelet

## 2015-06-10 ENCOUNTER — Other Ambulatory Visit: Payer: Self-pay | Admitting: Family Medicine

## 2015-06-10 NOTE — Telephone Encounter (Signed)
Patient requesting refill. 

## 2015-06-17 LAB — COMPREHENSIVE METABOLIC PANEL
A/G RATIO: 2.3 (ref 1.1–2.5)
ALBUMIN: 4.4 g/dL (ref 3.6–4.8)
ALT: 58 IU/L — ABNORMAL HIGH (ref 0–44)
AST: 27 IU/L (ref 0–40)
Alkaline Phosphatase: 94 IU/L (ref 39–117)
BUN / CREAT RATIO: 20 (ref 10–22)
BUN: 20 mg/dL (ref 8–27)
Bilirubin Total: 0.6 mg/dL (ref 0.0–1.2)
CALCIUM: 9.4 mg/dL (ref 8.6–10.2)
CO2: 24 mmol/L (ref 18–29)
CREATININE: 1 mg/dL (ref 0.76–1.27)
Chloride: 104 mmol/L (ref 96–106)
GFR calc Af Amer: 91 mL/min/{1.73_m2} (ref >59)
GFR, EST NON AFRICAN AMERICAN: 79 mL/min/{1.73_m2} (ref >59)
GLOBULIN, TOTAL: 1.9 g/dL (ref 1.5–4.5)
Glucose: 119 mg/dL — ABNORMAL HIGH (ref 65–99)
POTASSIUM: 4.3 mmol/L (ref 3.5–5.2)
SODIUM: 143 mmol/L (ref 134–144)
Total Protein: 6.3 g/dL (ref 6.0–8.5)

## 2015-06-17 LAB — LIPID PANEL
Chol/HDL Ratio: 3.4 ratio units (ref 0.0–5.0)
Cholesterol, Total: 165 mg/dL (ref 100–199)
HDL: 48 mg/dL (ref >39)
LDL Calculated: 96 mg/dL (ref 0–99)
Triglycerides: 106 mg/dL (ref 0–149)
VLDL Cholesterol Cal: 21 mg/dL (ref 5–40)

## 2015-06-17 LAB — CBC WITH DIFFERENTIAL/PLATELET
Basophils Absolute: 0.1 10*3/uL (ref 0.0–0.2)
Basos: 1 %
EOS (ABSOLUTE): 0.2 10*3/uL (ref 0.0–0.4)
Eos: 2 %
Hematocrit: 36.9 % — ABNORMAL LOW (ref 37.5–51.0)
Hemoglobin: 12.9 g/dL (ref 12.6–17.7)
Immature Grans (Abs): 0 10*3/uL (ref 0.0–0.1)
Immature Granulocytes: 0 %
Lymphocytes Absolute: 1.4 10*3/uL (ref 0.7–3.1)
Lymphs: 16 %
MCH: 31.7 pg (ref 26.6–33.0)
MCHC: 35 g/dL (ref 31.5–35.7)
MCV: 91 fL (ref 79–97)
Monocytes Absolute: 0.5 10*3/uL (ref 0.1–0.9)
Monocytes: 6 %
Neutrophils Absolute: 7 10*3/uL (ref 1.4–7.0)
Neutrophils: 75 %
Platelets: 211 10*3/uL (ref 150–379)
RBC: 4.07 x10E6/uL — ABNORMAL LOW (ref 4.14–5.80)
RDW: 14.5 % (ref 12.3–15.4)
WBC: 9.1 10*3/uL (ref 3.4–10.8)

## 2015-06-17 LAB — TESTOSTERONE: Testosterone: 206 ng/dL — ABNORMAL LOW (ref 348–1197)

## 2015-06-17 LAB — HEMOGLOBIN A1C
Est. average glucose Bld gHb Est-mCnc: 105 mg/dL
Hgb A1c MFr Bld: 5.3 % (ref 4.8–5.6)

## 2015-06-17 LAB — TSH: TSH: 3.02 u[IU]/mL (ref 0.450–4.500)

## 2015-06-17 LAB — VITAMIN D 25 HYDROXY (VIT D DEFICIENCY, FRACTURES): Vit D, 25-Hydroxy: 26.6 ng/mL — ABNORMAL LOW (ref 30.0–100.0)

## 2015-06-17 LAB — PSA: Prostate Specific Ag, Serum: 1.2 ng/mL (ref 0.0–4.0)

## 2015-06-22 ENCOUNTER — Telehealth: Payer: Self-pay

## 2015-06-22 NOTE — Telephone Encounter (Signed)
-----   Message from [name], MD sent at 06/22/2015  9:13 AM EST ----- Lipid panel is at goal  Fasting glucose is elevated but he has normal hgbA1C ( still does not have DM), normal kidney function, liver function ALT slightly bumped Vitamin D is okay, keep it above 20 with otc supplementation  Normal PSA CBC : normal WBC and no anemia Testosterone is low. We will discuss it with him during his next visit in one week

## 2015-06-22 NOTE — Telephone Encounter (Signed)
Left vm for patient to return my call for lab results. 

## 2015-07-05 ENCOUNTER — Ambulatory Visit: Payer: Managed Care, Other (non HMO) | Admitting: Family Medicine

## 2015-07-07 ENCOUNTER — Telehealth: Payer: Self-pay

## 2015-07-07 ENCOUNTER — Encounter: Payer: Self-pay | Admitting: Family Medicine

## 2015-07-07 ENCOUNTER — Ambulatory Visit
Admission: RE | Admit: 2015-07-07 | Discharge: 2015-07-07 | Disposition: A | Discharge status: Home / Self Care | Payer: Managed Care, Other (non HMO) | Source: Ambulatory Visit | Attending: Family Medicine | Admitting: Family Medicine

## 2015-07-07 ENCOUNTER — Ambulatory Visit (INDEPENDENT_AMBULATORY_CARE_PROVIDER_SITE_OTHER): Payer: Managed Care, Other (non HMO) | Admitting: Family Medicine

## 2015-07-07 VITALS — BP 122/80 | HR 77 | Temp 98.5°F | Resp 16 | Ht 71.0 in | Wt 230.0 lb

## 2015-07-07 DIAGNOSIS — E785 Hyperlipidemia, unspecified: Secondary | ICD-10-CM | POA: Diagnosis not present

## 2015-07-07 DIAGNOSIS — R05 Cough: Secondary | ICD-10-CM

## 2015-07-07 DIAGNOSIS — E039 Hypothyroidism, unspecified: Secondary | ICD-10-CM

## 2015-07-07 DIAGNOSIS — L821 Other seborrheic keratosis: Secondary | ICD-10-CM | POA: Diagnosis not present

## 2015-07-07 DIAGNOSIS — E291 Testicular hypofunction: Secondary | ICD-10-CM

## 2015-07-07 DIAGNOSIS — R062 Wheezing: Secondary | ICD-10-CM | POA: Insufficient documentation

## 2015-07-07 DIAGNOSIS — J309 Allergic rhinitis, unspecified: Secondary | ICD-10-CM

## 2015-07-07 DIAGNOSIS — I10 Essential (primary) hypertension: Secondary | ICD-10-CM | POA: Diagnosis not present

## 2015-07-07 DIAGNOSIS — R059 Cough, unspecified: Secondary | ICD-10-CM

## 2015-07-07 DIAGNOSIS — E8881 Metabolic syndrome: Secondary | ICD-10-CM

## 2015-07-07 DIAGNOSIS — E559 Vitamin D deficiency, unspecified: Secondary | ICD-10-CM | POA: Diagnosis not present

## 2015-07-07 DIAGNOSIS — J3089 Other allergic rhinitis: Secondary | ICD-10-CM

## 2015-07-07 DIAGNOSIS — J302 Other seasonal allergic rhinitis: Secondary | ICD-10-CM

## 2015-07-07 IMAGING — CR DG CHEST 2V
1 series · 2 of 2 positions shown · non-contrast
Comparison: None.

CLINICAL DATA: Wheezing and nonproductive cough for 3 weeks.

EXAM:
CHEST  2 VIEW

[Series 1: dg chest 2 view · 0.14mm/px · 2 of 2 slices shown]
[im 1/2]
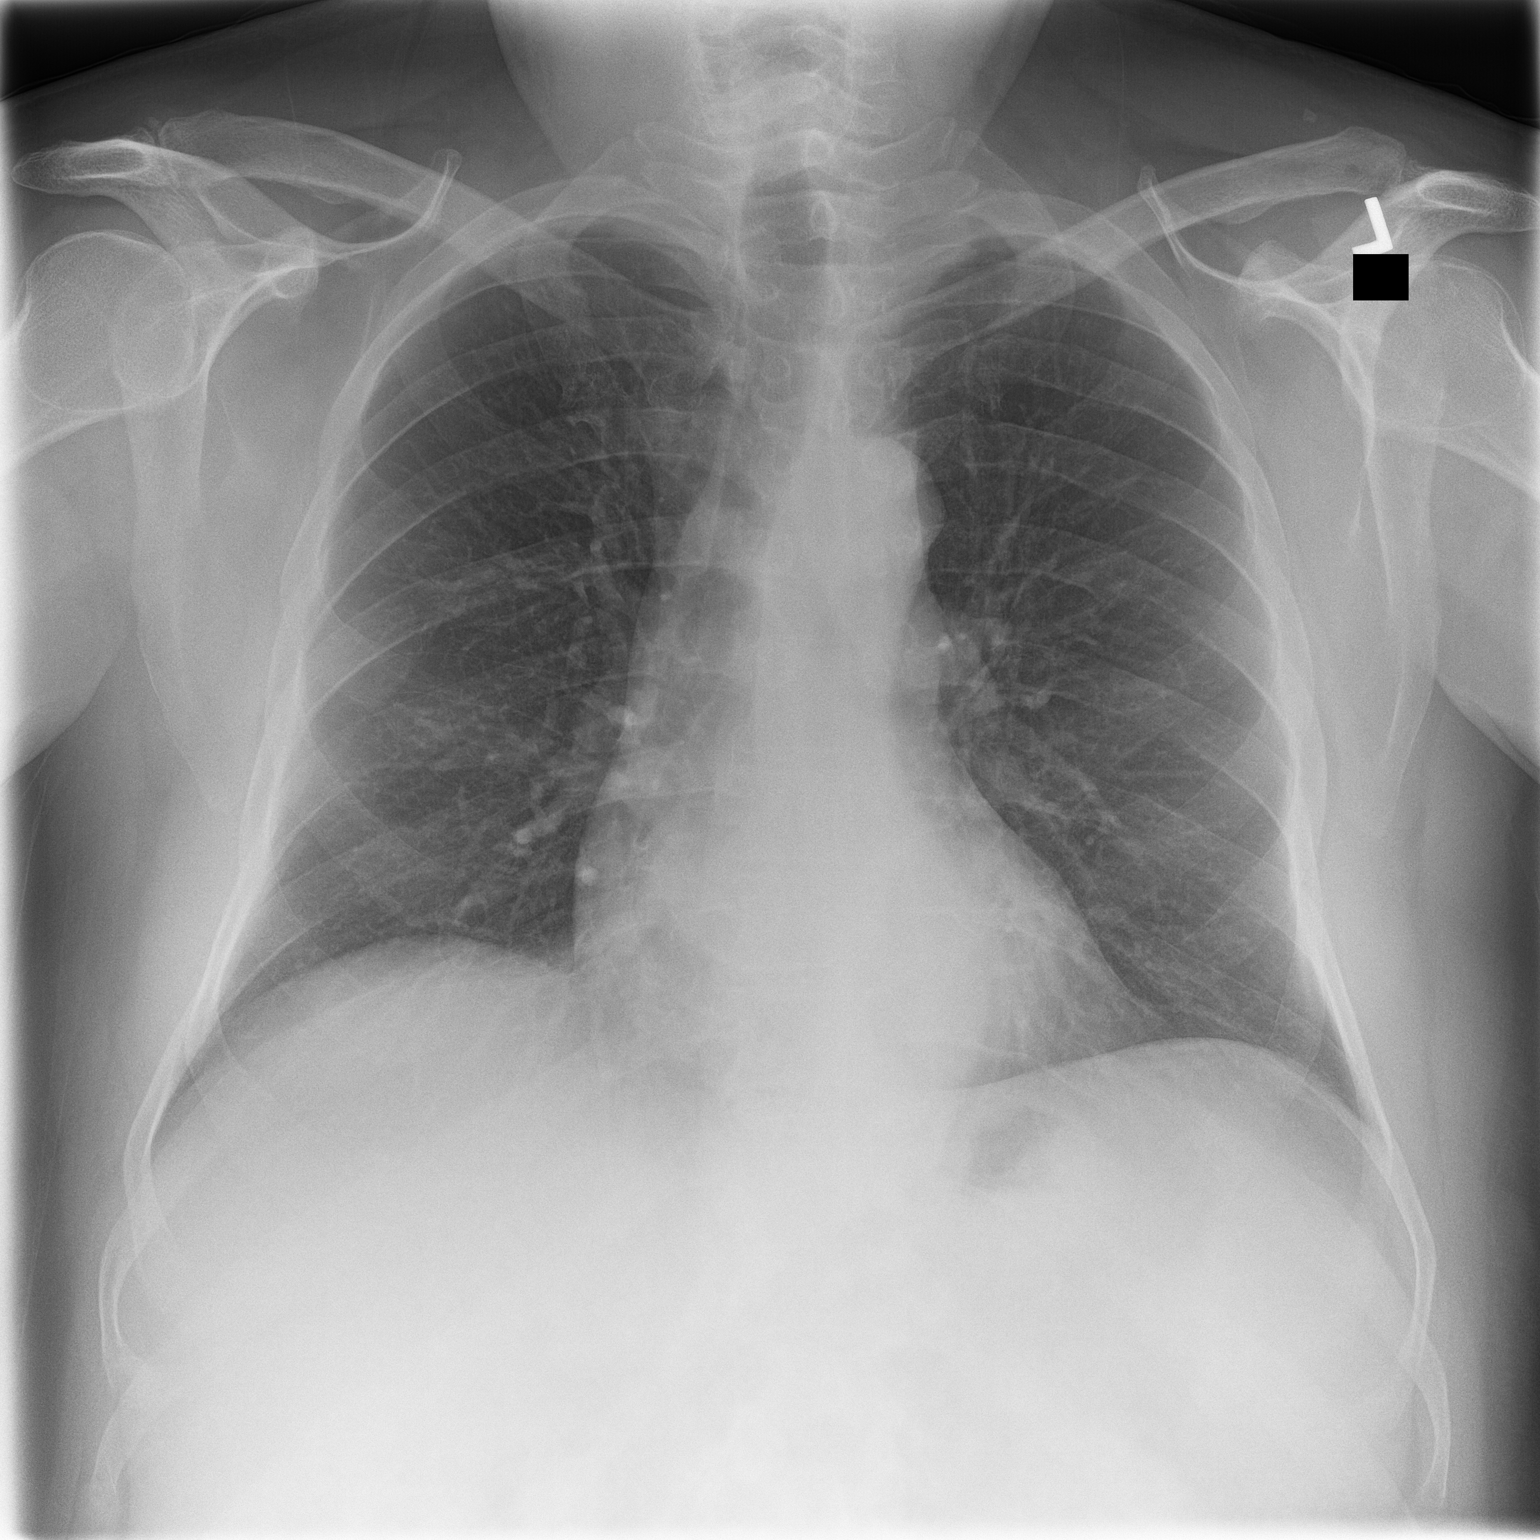
[im 2/2]
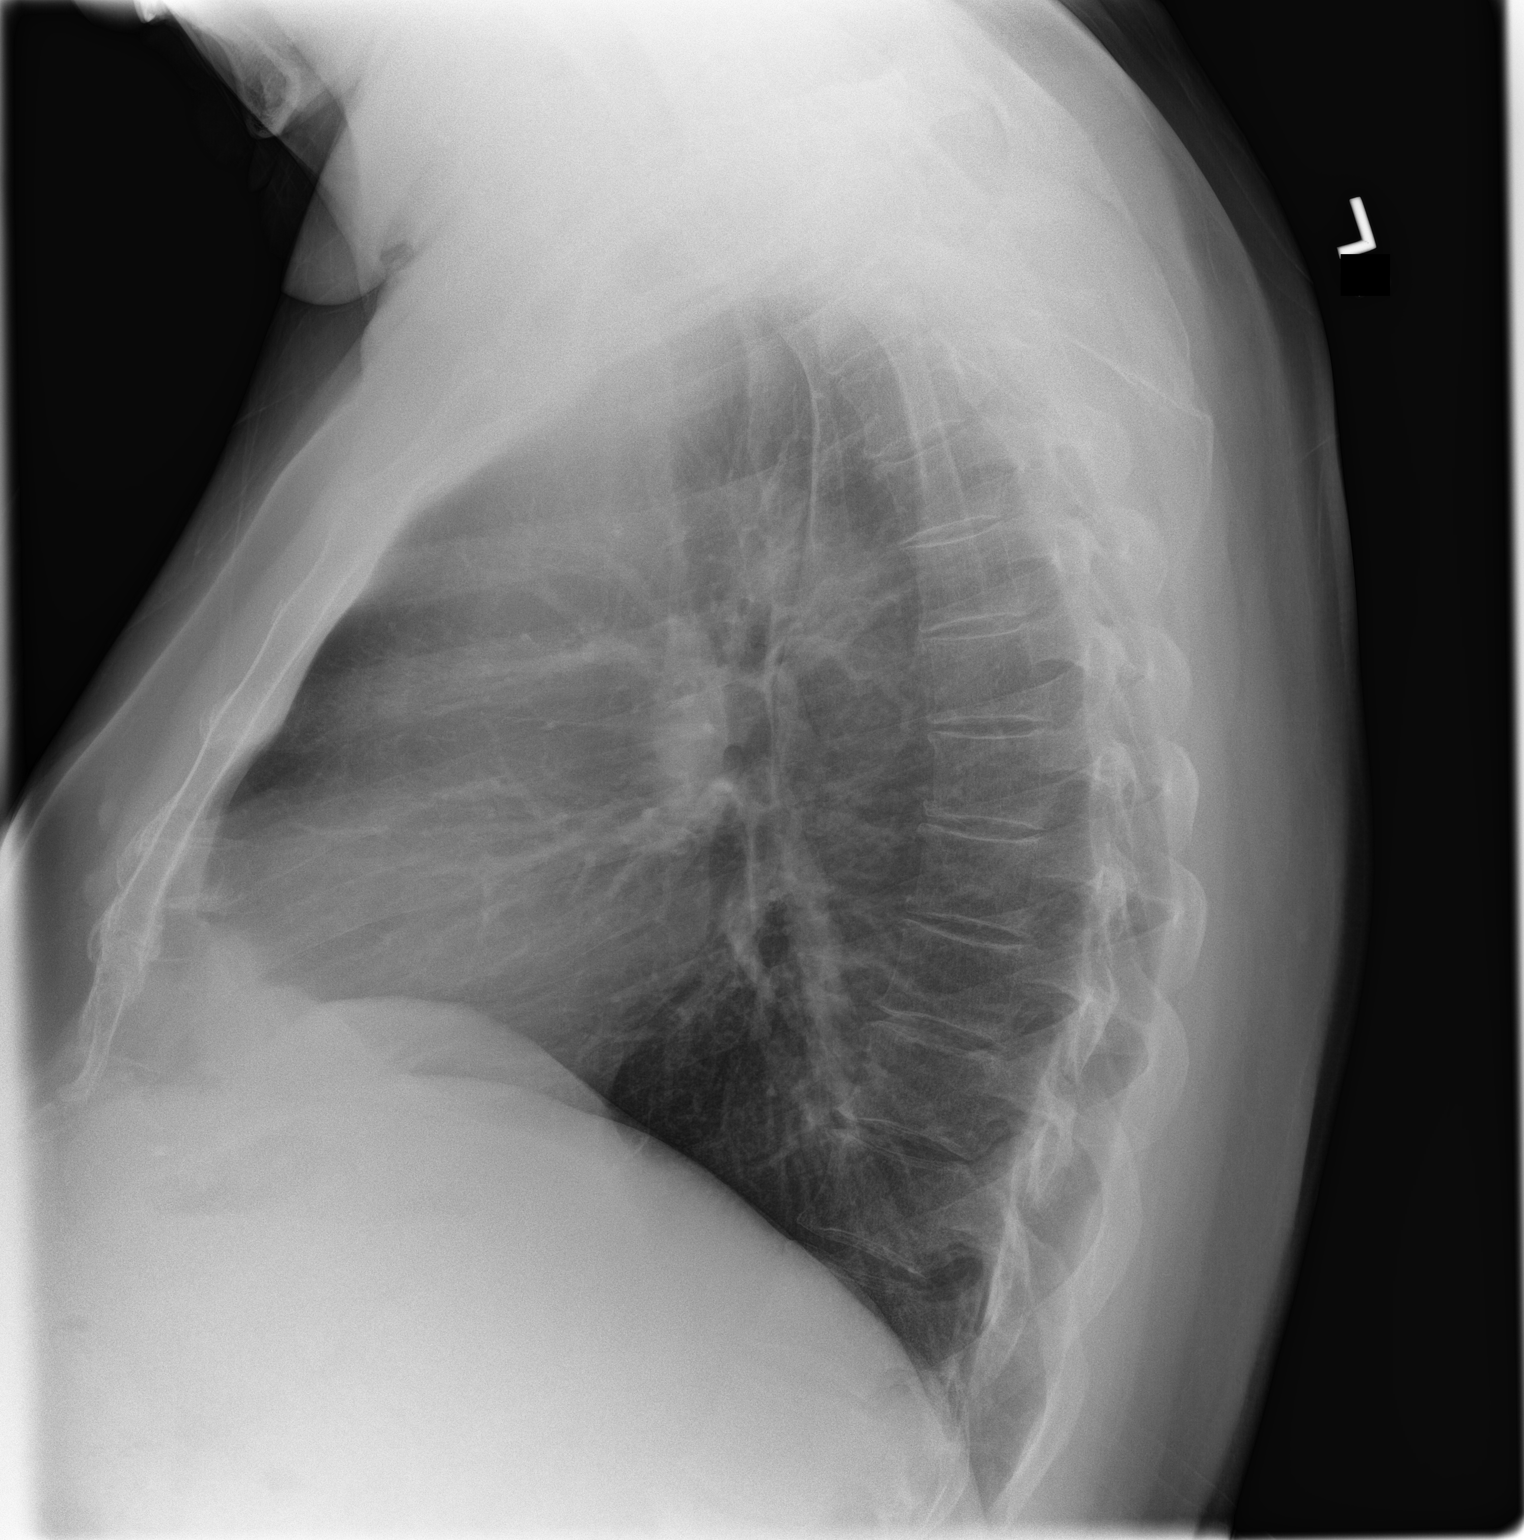

[2 of 2 positions shown; findings below may reference images not displayed]

FINDINGS: Normal heart size and mediastinal contours. No acute infiltrate or
edema. Aeration is symmetric. No effusion or pneumothorax. No acute
osseous findings.
IMPRESSION: No active cardiopulmonary disease.

## 2015-07-07 MED ORDER — VITAMIN D 50 MCG (2000 UT) PO CAPS: 1.0000 | ORAL_CAPSULE | Freq: Every day | ORAL | Status: AC

## 2015-07-07 MED ORDER — TESTOSTERONE 20.25 MG/ACT (1.62%) TD GEL: 50.0000 mg | Freq: Every day | TRANSDERMAL | Status: DC

## 2015-07-07 MED ORDER — FLUTICASONE PROPIONATE 50 MCG/ACT NA SUSP: 2.0000 | Freq: Every day | NASAL | Status: DC

## 2015-07-07 MED ORDER — FLUTICASONE FUROATE-VILANTEROL 100-25 MCG/INH IN AEPB: 1.0000 | INHALATION_SPRAY | Freq: Every day | RESPIRATORY_TRACT | Status: DC

## 2015-07-07 NOTE — Addendum Note (Signed)
Addended by: [name] F on: 07/07/2015 10:15 AM   Modules accepted: Orders

## 2015-07-07 NOTE — Telephone Encounter (Signed)
-----   Message from [name], MD sent at 07/07/2015  1:29 PM EST ----- Normal CXR, he can start inhaler today and should improve his symptoms of coughing and wheezing, likely from recent URI

## 2015-07-07 NOTE — Progress Notes (Addendum)
Name: Alan Campbell   [name]: [id number]    DOB: 08-05-1950   Date:07/07/2015       Progress Note  Subjective  Chief Complaint  Chief Complaint  Patient presents with  . Medication Refill    6 month F/U  . Hypertension    Well controlled  . Hypothyroidism  . Hyperlipidemia  . Metabolic Syndrome    Is going to join the gym, weight is stable  . Testicular hypofunction  . Cough    Hanging on since Christmas, worst at night, clear mucus. Nasal Congestion. Has been taking Delsym DM with some relief.     HPI  Hypogonadism: he is on testosterone but even on two pumps daily his testosterone level was low in December 2016, he has not been sexually active for a while - since wife had chemo. He is taking for bone mass and also improves his energy level  HTN: taking medication, no side effects, no chest pain, no palpitation  Hypothyroidism: still has dry skin, no constipation, taking medication as prescribed  Dyslipidemia: taking Crestor daily, no myalgias  Osteoporosis: took Forteo for 2 years, not on any medication, advised to take Vitamin D otc, high dairy intake per day.   Metabolic Syndrome: fasting glucose was up, but hgbA1C,was normal. Discussed again importance of weight loss, and a lean diet  Vitamin D with a history of osteoporosis: advised him to resume vitamin D otc   URI: he states he had a cold a few weeks ago, doing better on Delsym for a dry cough, he also has noticed nasal congestion, mild rhinorrhea in am  Spot on left inguinal area: not growing, no redness or irritation.      Patient Active Problem List   Diagnosis Date Noted  . Benign essential HTN 12/30/2014  . Dyslipidemia 12/30/2014  . Adult hypothyroidism 12/30/2014  . Dysmetabolic syndrome 123456  . Headache, migraine 12/30/2014  . Obesity (BMI 30-39.9) 12/30/2014  . Arthritis, degenerative 12/30/2014  . Allergic rhinitis 12/30/2014  . 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  . Patella-femoral syndrome  12/03/2014  . Low back pain 04/03/2010  . Vitamin D deficiency 04/14/2009  . Abnormal presence of protein in urine 09/12/2007  . OP (osteoporosis) 05/05/2007    Past Surgical History  Procedure Laterality Date  . Laminectomy  1985    L-5    Family History  Problem Relation Age of Onset  . Heart disease Father   . Hypothyroidism Sister   . Hypothyroidism Sister   . Migraines Sister     Social History   Social History  . Marital Status: Married    Spouse Name: N/A  . Number of Children: N/A  . Years of Education: N/A   Occupational History  . Not on file.   Social History Main Topics  . Smoking status: Never Smoker   . Smokeless tobacco: Never Used  . Alcohol Use: 2.4 oz/week    4 Standard drinks or equivalent per week  . Drug Use: No  . Sexual Activity:    Partners: Female   Other Topics Concern  . Not on file   Social History Narrative     Current outpatient prescriptions:  .  amLODipine-olmesartan (AZOR) 5-40 MG tablet, Take 1 tablet by mouth daily., Disp: 30 tablet, Rfl: 5 .  [name] PUMP 20.25 MG/ACT (1.62%) GEL, APPLY 4 PUMPS TOPICALLY TO UPPER ARMS/ SHOULDERS ONCE DAILY., Disp: 150 g, Rfl: 2 .  aspirin 81 MG chewable tablet, Chew 1 tablet by mouth daily., Disp: ,  Rfl:  .  metFORMIN (GLUCOPHAGE) 500 MG tablet, Take 1 tablet (500 mg total) by mouth daily., Disp: 30 tablet, Rfl: 5 .  Multiple Vitamins-Iron (MULTIPLE VITAMIN/IRON PO), Take 1 tablet by mouth daily., Disp: , Rfl:  .  OMEGA-3 FATTY ACIDS PO, Take 1 tablet by mouth daily., Disp: , Rfl:  .  rosuvastatin (CRESTOR) 20 MG tablet, Take 1 tablet (20 mg total) by mouth daily., Disp: 30 tablet, Rfl: 5 .  SYNTHROID 200 MCG tablet, Take 1 tablet (200 mcg total) by mouth daily before breakfast. And one and a half pill on Sunday, Disp: 32 tablet, Rfl: 5 .  triamterene-hydrochlorothiazide (MAXZIDE-25) 37.5-25 MG tablet, Take 1 tablet by mouth daily., Disp: 30 tablet, Rfl: 5 .  Verapamil HCl CR 300 MG CP24,  Take 1 capsule (300 mg total) by mouth daily., Disp: 30 each, Rfl: 5  No Known Allergies   ROS  Constitutional: Negative for fever or weight change.  Respiratory: Negative for cough and shortness of breath.   Cardiovascular: Negative for chest pain or palpitations.  Gastrointestinal: Negative for abdominal pain, no bowel changes.  Musculoskeletal: Negative for gait problem or joint swelling.  Skin: Negative for rash.  Neurological: Negative for dizziness or headache.  No other specific complaints in a complete review of systems (except as listed in HPI above).  Objective  Filed Vitals:   07/07/15 0923  BP: 122/80  Pulse: 77  Temp: 98.5 F (36.9 C)  TempSrc: Oral  Resp: 16  Height: 5\' 11"  (1.803 m)  Weight: 230 lb (104.327 kg)  SpO2: 95%    Body mass index is 32.09 kg/(m^2).  Physical Exam  Constitutional: Patient appears well-developed and well-nourished. Obese  No distress.  HEENT: head atraumatic, normocephalic, pupils equal and reactive to light, neck supple, throat within normal limits Cardiovascular: Normal rate, regular rhythm and normal heart sounds.  No murmur heard. No BLE edema. Pulmonary/Chest: Effort normal , wheezing on right lung only, no other abnormal sounds. No respiratory distress. Abdominal: Soft.  There is no tenderness. Psychiatric: Patient has a normal mood and affect. behavior is normal. Judgment and thought content normal. Skin: SK on left inguinal area.   Recent Results (from the past 2160 hour(s))  Lipid panel     Status: None   Collection Time: 06/16/15  8:42 AM  Result Value Ref Range   Cholesterol, Total 165 100 - 199 mg/dL   Triglycerides 106 0 - 149 mg/dL   HDL 48 >39 mg/dL   VLDL Cholesterol Cal 21 5 - 40 mg/dL   LDL Calculated 96 0 - 99 mg/dL   Chol/HDL Ratio 3.4 0.0 - 5.0 ratio units    Comment:                                   T. Chol/HDL Ratio                                             Men  Women                                1/2 Avg.Risk  3.4    3.3  Avg.Risk  5.0    4.4                                2X Avg.Risk  9.6    7.1                                3X Avg.Risk 23.4   11.0   Comprehensive metabolic panel     Status: Abnormal   Collection Time: 06/16/15  8:42 AM  Result Value Ref Range   Glucose 119 (H) 65 - 99 mg/dL   BUN 20 8 - 27 mg/dL   Creatinine, Ser 1.00 0.76 - 1.27 mg/dL   GFR calc non Af Amer 79 >59 mL/min/1.73   GFR calc Af Amer 91 >59 mL/min/1.73   BUN/Creatinine Ratio 20 10 - 22   Sodium 143 134 - 144 mmol/L   Potassium 4.3 3.5 - 5.2 mmol/L   Chloride 104 96 - 106 mmol/L   CO2 24 18 - 29 mmol/L   Calcium 9.4 8.6 - 10.2 mg/dL   Total Protein 6.3 6.0 - 8.5 g/dL   Albumin 4.4 3.6 - 4.8 g/dL   Globulin, Total 1.9 1.5 - 4.5 g/dL   Albumin/Globulin Ratio 2.3 1.1 - 2.5   Bilirubin Total 0.6 0.0 - 1.2 mg/dL   Alkaline Phosphatase 94 39 - 117 IU/L   AST 27 0 - 40 IU/L   ALT 58 (H) 0 - 44 IU/L  Hemoglobin A1c     Status: None   Collection Time: 06/16/15  8:42 AM  Result Value Ref Range   Hgb A1c MFr Bld 5.3 4.8 - 5.6 %    Comment:          Pre-diabetes: 5.7 - 6.4          Diabetes: >6.4          Glycemic control for adults with diabetes: <7.0    Est. average glucose Bld gHb Est-mCnc 105 mg/dL  TSH     Status: None   Collection Time: 06/16/15  8:42 AM  Result Value Ref Range   TSH 3.020 0.450 - 4.500 uIU/mL  VITAMIN D 25 Hydroxy (Vit-D Deficiency, Fractures)     Status: Abnormal   Collection Time: 06/16/15  8:42 AM  Result Value Ref Range   Vit D, 25-Hydroxy 26.6 (L) 30.0 - 100.0 ng/mL    Comment: Vitamin D deficiency has been defined by the Institute of Medicine and an Endocrine Society practice guideline as a level of serum 25-OH vitamin D less than 20 ng/mL (1,2). The Endocrine Society went on to further define vitamin D insufficiency as a level between 21 and 29 ng/mL (2). 1. IOM (Institute of Medicine). 2010. Dietary reference    intakes for  calcium and D. [place]: The    [employer]. 2. Holick MF, Binkley [place], Bischoff-Ferrari HA, et al.    Evaluation, treatment, and prevention of vitamin D    deficiency: an Endocrine Society clinical practice    guideline. JCEM. 2011 Jul; 96(7):1911-30.   PSA     Status: None   Collection Time: 06/16/15  8:42 AM  Result Value Ref Range   Prostate Specific Ag, Serum 1.2 0.0 - 4.0 ng/mL    Comment: Roche ECLIA methodology. According to the American Urological Association, Serum PSA should decrease and remain at undetectable levels after radical prostatectomy. The AUA  defines biochemical recurrence as an initial PSA value 0.2 ng/mL or greater followed by a subsequent confirmatory PSA value 0.2 ng/mL or greater. Values obtained with different assay methods or kits cannot be used interchangeably. Results cannot be interpreted as absolute evidence of the presence or absence of malignant disease.   CBC with Differential/Platelet     Status: Abnormal   Collection Time: 06/16/15  8:42 AM  Result Value Ref Range   WBC 9.1 3.4 - 10.8 x10E3/uL   RBC 4.07 (L) 4.14 - 5.80 x10E6/uL   Hemoglobin 12.9 12.6 - 17.7 g/dL   Hematocrit 36.9 (L) 37.5 - 51.0 %   MCV 91 79 - 97 fL   MCH 31.7 26.6 - 33.0 pg   MCHC 35.0 31.5 - 35.7 g/dL   RDW 14.5 12.3 - 15.4 %   Platelets 211 150 - 379 x10E3/uL   Neutrophils 75 %   Lymphs 16 %   Monocytes 6 %   Eos 2 %   Basos 1 %   Neutrophils Absolute 7.0 1.4 - 7.0 x10E3/uL   Lymphocytes Absolute 1.4 0.7 - 3.1 x10E3/uL   Monocytes Absolute 0.5 0.1 - 0.9 x10E3/uL   EOS (ABSOLUTE) 0.2 0.0 - 0.4 x10E3/uL   Basophils Absolute 0.1 0.0 - 0.2 x10E3/uL   Immature Granulocytes 0 %   Immature Grans (Abs) 0.0 0.0 - 0.1 x10E3/uL  Testosterone     Status: Abnormal   Collection Time: 06/16/15  8:42 AM  Result Value Ref Range   Testosterone 206 (L) 348 - 1197 ng/dL   Comment, Testosterone Comment     Comment: Adult male reference interval is based on a  population of lean males up to 66 years old.     PHQ2/9: Depression screen [facility] 2/9 07/07/2015 05/06/2015 12/31/2014  Decreased Interest 0 0 0  Down, Depressed, Hopeless 0 0 0  PHQ - 2 Score 0 0 0     Fall Risk: Fall Risk  07/07/2015 05/06/2015 12/31/2014  Falls in the past year? No No No     Functional Status Survey: Is the patient deaf or have difficulty hearing?: No Does the patient have difficulty seeing, even when wearing glasses/contacts?: Yes (glasses) Does the patient have difficulty concentrating, remembering, or making decisions?: No Does the patient have difficulty walking or climbing stairs?: No Does the patient have difficulty dressing or bathing?: No Does the patient have difficulty doing errands alone such as visiting a doctor's office or shopping?: No    Assessment & Plan  1. Hypothyroidism, adult  TSH was at goal  2. Benign essential HTN  bp is at goal, continue current medication   3. Dyslipidemia  Discussed diet and also continue medication   4. Dysmetabolic syndrome  Discussed life style modification   5. Vitamin D deficiency  Resume otc vitamin D   6. Perennial allergic rhinitis with seasonal variation  - fluticasone (FLONASE) 50 MCG/ACT nasal spray; Place 2 sprays into both nostrils daily.  Dispense: 16 g; Refill: 6  7. Testicular hypofunction  - Testosterone ([name] PUMP) 20.25 MG/ACT (1.62%) GEL; Place 50 mg onto the skin daily.  Dispense: 150 g; Refill: 2   8. Seborrheic keratoses  Reassurance for now  9. Wheezing  - DG Chest 2 View; Future  10. Cough  - DG Chest 2 View; Future - Fluticasone Furoate-Vilanterol (BREO ELLIPTA) 100-25 MCG/INH AEPB; Inhale 1 puff into the lungs daily.  Dispense: 60 each; Refill: 0 - hold to get it until CXR report is back

## 2015-07-07 NOTE — Telephone Encounter (Signed)
Called and left a message.

## 2015-07-08 ENCOUNTER — Telehealth: Payer: Self-pay | Admitting: Family Medicine

## 2015-07-08 DIAGNOSIS — E291 Testicular hypofunction: Secondary | ICD-10-CM

## 2015-07-08 MED ORDER — TESTOSTERONE 20.25 MG/ACT (1.62%) TD GEL: 60.0000 mg | Freq: Every day | TRANSDERMAL | Status: DC

## 2015-07-08 NOTE — Telephone Encounter (Signed)
[name] said it has to be 60.75 to be exact, but he took care of it.

## 2015-07-08 NOTE — Telephone Encounter (Signed)
Alan Campbell from [facility] has concern about [name] pump. States that you want him to have 50mg  per day, however they can only do 20, 40, or 60mg . [phone]

## 2015-07-08 NOTE — Telephone Encounter (Signed)
Sent for 60 mg. Thank you

## 2015-07-09 ENCOUNTER — Other Ambulatory Visit: Payer: Self-pay | Admitting: Family Medicine

## 2015-07-14 ENCOUNTER — Telehealth: Payer: Self-pay

## 2015-07-14 DIAGNOSIS — E291 Testicular hypofunction: Secondary | ICD-10-CM

## 2015-07-14 MED ORDER — TESTOSTERONE 20.25 MG/ACT (1.62%) TD GEL: TRANSDERMAL | Status: DC

## 2015-07-14 NOTE — Telephone Encounter (Signed)
Patient states for Insurance to approve his increase of dose of [name] [employer] company needs the prescription to state 3 pumps a day instead of dosage by mgs per day. Patient would prescription only comes in 1 size bottle, so would need 1 bottle a month and the next month 2 bottles. Each bottle only comes in 60 pumps a bottle.

## 2015-07-25 ENCOUNTER — Other Ambulatory Visit: Payer: Self-pay | Admitting: Family Medicine

## 2015-07-26 NOTE — Telephone Encounter (Signed)
Patient requesting refill. 

## 2015-07-31 ENCOUNTER — Other Ambulatory Visit: Payer: Self-pay | Admitting: Family Medicine

## 2015-08-11 ENCOUNTER — Telehealth: Payer: Self-pay | Admitting: Family Medicine

## 2015-08-11 MED ORDER — OSELTAMIVIR PHOSPHATE 75 MG PO CAPS: 75.0000 mg | ORAL_CAPSULE | Freq: Every day | ORAL | Status: DC

## 2015-08-11 NOTE — Telephone Encounter (Signed)
Wife was dx with flu and patient would like to know if you would prescribe him tamiflu as a preventative. At this present moment he does not have any symptoms. Please send to [facility]

## 2015-08-11 NOTE — Telephone Encounter (Signed)
Sent prescription to his pharmacy. He needs to take one daily for 10 days if he does not have symptoms

## 2015-08-11 NOTE — Telephone Encounter (Signed)
Pt would like a call back about a few prescriptions.

## 2015-08-12 NOTE — Telephone Encounter (Signed)
LVM to inform pt.

## 2015-09-04 ENCOUNTER — Other Ambulatory Visit: Payer: Self-pay | Admitting: Family Medicine

## 2015-10-13 ENCOUNTER — Other Ambulatory Visit: Payer: Self-pay | Admitting: Family Medicine

## 2015-11-04 ENCOUNTER — Ambulatory Visit (INDEPENDENT_AMBULATORY_CARE_PROVIDER_SITE_OTHER): Payer: Managed Care, Other (non HMO) | Admitting: Family Medicine

## 2015-11-04 ENCOUNTER — Encounter: Payer: Self-pay | Admitting: Family Medicine

## 2015-11-04 VITALS — BP 142/86 | HR 74 | Temp 98.2°F | Resp 18 | Ht 71.0 in | Wt 236.0 lb

## 2015-11-04 DIAGNOSIS — J309 Allergic rhinitis, unspecified: Secondary | ICD-10-CM

## 2015-11-04 DIAGNOSIS — I10 Essential (primary) hypertension: Secondary | ICD-10-CM | POA: Diagnosis not present

## 2015-11-04 DIAGNOSIS — Z79899 Other long term (current) drug therapy: Secondary | ICD-10-CM | POA: Diagnosis not present

## 2015-11-04 DIAGNOSIS — E559 Vitamin D deficiency, unspecified: Secondary | ICD-10-CM | POA: Diagnosis not present

## 2015-11-04 DIAGNOSIS — J302 Other seasonal allergic rhinitis: Secondary | ICD-10-CM

## 2015-11-04 DIAGNOSIS — E785 Hyperlipidemia, unspecified: Secondary | ICD-10-CM

## 2015-11-04 DIAGNOSIS — E291 Testicular hypofunction: Secondary | ICD-10-CM

## 2015-11-04 DIAGNOSIS — E8881 Metabolic syndrome: Secondary | ICD-10-CM | POA: Diagnosis not present

## 2015-11-04 DIAGNOSIS — E039 Hypothyroidism, unspecified: Secondary | ICD-10-CM | POA: Diagnosis not present

## 2015-11-04 DIAGNOSIS — J3089 Other allergic rhinitis: Secondary | ICD-10-CM

## 2015-11-04 MED ORDER — METFORMIN HCL 500 MG PO TABS: 500.0000 mg | ORAL_TABLET | Freq: Every day | ORAL | Status: DC

## 2015-11-04 MED ORDER — AMLODIPINE BESYLATE-VALSARTAN 10-320 MG PO TABS: 1.0000 | ORAL_TABLET | Freq: Every day | ORAL | Status: DC

## 2015-11-04 MED ORDER — TRIAMTERENE-HCTZ 37.5-25 MG PO TABS: 1.0000 | ORAL_TABLET | Freq: Every day | ORAL | Status: DC

## 2015-11-04 MED ORDER — ROSUVASTATIN CALCIUM 20 MG PO TABS: 20.0000 mg | ORAL_TABLET | Freq: Every day | ORAL | Status: DC

## 2015-11-04 MED ORDER — METOPROLOL SUCCINATE ER 50 MG PO TB24: 50.0000 mg | ORAL_TABLET | Freq: Every day | ORAL | Status: DC

## 2015-11-04 NOTE — Progress Notes (Signed)
Name: Alan Campbell   [name]: [id number]    DOB: 03-10-1951   Date:11/04/2015       Progress Note  Subjective  Chief Complaint  Chief Complaint  Patient presents with  . Medication Refill    4 month F/U  . Hypothyroidism  . Hypertension  . Hyperlipidemia  . Metabolic Syndrome    Patient is taking one tablet a day due to side effects.  . Testicular Hypofunction    Patient insurance will cover 2 bottles a month with 4 pumps per day.   . Allergic Rhinitis     Doing well on Flonase as needed due to pollen    HPI  Hypogonadism: he is on testosterone but even on two pumps daily his testosterone level was low in December 2016, he has not been sexually active for a while - since wife had chemo. He is taking for bone mass and also improves his energy level. He is now on 3 pumps daily and we will recheck labs today  HTN: taking medication, no side effects, no chest pain, no palpitation  Hypothyroidism: still has dry skin, no constipation, taking medication as prescribed, last TSH at goal in December  Dyslipidemia: taking Crestor daily, no myalgias, last labs was reviewed  Osteoporosis: took Forteo for 2 years, not on any medication, advised to take Vitamin D otc, high dairy intake per day.   Metabolic Syndrome: fasting glucose was up, but hgbA1C,was normal. Discussed again importance of weight loss, and a lean diet  Vitamin D with a history of osteoporosis: advised him to resume vitamin D otc    Patient Active Problem List   Diagnosis Date Noted  . Benign essential HTN 12/30/2014  . Dyslipidemia 12/30/2014  . Adult hypothyroidism 12/30/2014  . Dysmetabolic syndrome 123456  . Headache, migraine 12/30/2014  . Obesity (BMI 30-39.9) 12/30/2014  . Arthritis, degenerative 12/30/2014  . Perennial allergic rhinitis with seasonal variation 12/30/2014  . 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  . Patella-femoral syndrome 12/03/2014  . Low back pain 04/03/2010  . Vitamin D deficiency  04/14/2009  . Abnormal presence of protein in urine 09/12/2007  . OP (osteoporosis) 05/05/2007    Past Surgical History  Procedure Laterality Date  . Laminectomy  1985    L-5    Family History  Problem Relation Age of Onset  . Heart disease Father   . Hypothyroidism Sister   . Hypothyroidism Sister   . Migraines Sister     Social History   Social History  . Marital Status: Married    Spouse Name: N/A  . Number of Children: N/A  . Years of Education: N/A   Occupational History  . Not on file.   Social History Main Topics  . Smoking status: Never Smoker   . Smokeless tobacco: Never Used  . Alcohol Use: 2.4 oz/week    4 Standard drinks or equivalent per week  . Drug Use: No  . Sexual Activity:    Partners: Female   Other Topics Concern  . Not on file   Social History Narrative     Current outpatient prescriptions:  .  [name] PUMP 20.25 MG/ACT (1.62%) GEL, APPLY 4 PUMPS TOPICALLY TO SKIN DAILY ASDIRECTED., Disp: 150 g, Rfl: 2 .  aspirin 81 MG chewable tablet, Chew 1 tablet by mouth daily., Disp: , Rfl:  .  AZOR 5-40 MG tablet, TAKE 1 TABLET BY MOUTH ONCE DAILY., Disp: 30 tablet, Rfl: 5 .  Cholecalciferol (VITAMIN D) 2000 units CAPS, Take 1 capsule (2,000 Units  total) by mouth daily., Disp: 30 capsule, Rfl: 0 .  fluticasone (FLONASE) 50 MCG/ACT nasal spray, Place 2 sprays into both nostrils daily., Disp: 16 g, Rfl: 6 .  metFORMIN (GLUCOPHAGE) 500 MG tablet, TAKE 1 TABLET BY MOUTH ONCE DAILY, Disp: 30 tablet, Rfl: 5 .  Multiple Vitamins-Iron (MULTIPLE VITAMIN/IRON PO), Take 1 tablet by mouth daily., Disp: , Rfl:  .  OMEGA-3 FATTY ACIDS PO, Take 1 tablet by mouth daily., Disp: , Rfl:  .  rosuvastatin (CRESTOR) 20 MG tablet, TAKE 1 TABLET BY MOUTH ONCE DAILY., Disp: 30 tablet, Rfl: 5 .  SYNTHROID 200 MCG tablet, TAKE 1 AND 1/2 TABLETS BY MOUTH ON SUNDAY, AND 1 TABLET DAILY ON ALL OTHER DAYS. (Patient taking differently: 1/2 TABLETS BY MOUTH ON SUNDAY, AND 1 TABLET  DAILY ON ALL OTHER DAYS.), Disp: 32 tablet, Rfl: 1 .  triamterene-hydrochlorothiazide (MAXZIDE-25) 37.5-25 MG tablet, TAKE 1 TABLET BY MOUTH ONCE DAILY., Disp: 30 tablet, Rfl: 5 .  Verapamil HCl CR 300 MG CP24, TAKE 1 CAPSULE BY MOUTH EVERY EVENING., Disp: 30 each, Rfl: 5  No Known Allergies   ROS  Ten systems reviewed and is negative except as mentioned in HPI   Objective  Filed Vitals:   11/04/15 0855  BP: 142/86  Pulse: 74  Temp: 98.2 F (36.8 C)  TempSrc: Oral  Resp: 18  Height: 5\' 11"  (1.803 m)  Weight: 236 lb (107.049 kg)  SpO2: 97%    Body mass index is 32.93 kg/(m^2).  Physical Exam  Constitutional: Patient appears well-developed and well-nourished. Obese  No distress.  HEENT: head atraumatic, normocephalic, pupils equal and reactive to light,  neck supple, throat within normal limits Cardiovascular: Normal rate, regular rhythm and normal heart sounds.  No murmur heard. 1 plus  BLE edema on ankles Pulmonary/Chest: Effort normal and breath sounds normal. No respiratory distress. Abdominal: Soft.  There is no tenderness. Psychiatric: Patient has a normal mood and affect. behavior is normal. Judgment and thought content normal.  PHQ2/9: Depression screen [facility] 2/9 11/04/2015 07/07/2015 05/06/2015 12/31/2014  Decreased Interest 0 0 0 0  Down, Depressed, Hopeless 0 0 0 0  PHQ - 2 Score 0 0 0 0    Fall Risk: Fall Risk  11/04/2015 07/07/2015 05/06/2015 12/31/2014  Falls in the past year? No No No No    Functional Status Survey: Is the patient deaf or have difficulty hearing?: No Does the patient have difficulty seeing, even when wearing glasses/contacts?: No Does the patient have difficulty concentrating, remembering, or making decisions?: No Does the patient have difficulty walking or climbing stairs?: No Does the patient have difficulty dressing or bathing?: No Does the patient have difficulty doing errands alone such as visiting a doctor's office or shopping?:  No   Assessment & Plan  1. Hypothyroidism, adult  - TSH  2. Benign essential HTN  We will switch from Cardizem to Toprol and increase amlodipine - triamterene-hydrochlorothiazide (MAXZIDE-25) 37.5-25 MG tablet; Take 1 tablet by mouth daily.  Dispense: 30 tablet; Refill: 3 - metoprolol succinate (TOPROL-XL) 50 MG 24 hr tablet; Take 1-2 tablets (50-100 mg total) by mouth daily. Take with or immediately following a meal. In place of Verapamil  Dispense: 60 tablet; Refill: 0 - amLODipine-valsartan (EXFORGE) 10-320 MG tablet; Take 1 tablet by mouth daily.  Dispense: 30 tablet; Refill: 0  3. Dyslipidemia  - rosuvastatin (CRESTOR) 20 MG tablet; Take 1 tablet (20 mg total) by mouth daily.  Dispense: 30 tablet; Refill: 3 - Lipid panel  4.  Dysmetabolic syndrome  - metFORMIN (GLUCOPHAGE) 500 MG tablet; Take 1 tablet (500 mg total) by mouth daily.  Dispense: 30 tablet; Refill: 3 - Hemoglobin A1c  5. Perennial allergic rhinitis with seasonal variation  stable  6. Testicular hypofunction  Using 3 pumps daily, but insurance only pays for 2 or 4 pumps - Testosterone Total,Free,Bio, Males  7. Long-term use of high-risk medication  - Comprehensive metabolic panel - CBC with Differential/Platelet  8. Vitamin D deficiency  - VITAMIN D 25 Hydroxy (Vit-D Deficiency, Fractures)

## 2015-11-05 ENCOUNTER — Other Ambulatory Visit: Payer: Self-pay | Admitting: Family Medicine

## 2015-11-07 NOTE — Telephone Encounter (Signed)
Patient requesting refill. 

## 2015-11-30 LAB — CBC WITH DIFFERENTIAL/PLATELET
BASOS ABS: 0 10*3/uL (ref 0.0–0.2)
Basos: 1 %
EOS (ABSOLUTE): 0.2 10*3/uL (ref 0.0–0.4)
Eos: 3 %
HEMATOCRIT: 36.5 % — AB (ref 37.5–51.0)
Hemoglobin: 12.4 g/dL — ABNORMAL LOW (ref 12.6–17.7)
IMMATURE GRANULOCYTES: 0 %
Immature Grans (Abs): 0 10*3/uL (ref 0.0–0.1)
LYMPHS ABS: 1.6 10*3/uL (ref 0.7–3.1)
Lymphs: 29 %
MCH: 31.2 pg (ref 26.6–33.0)
MCHC: 34 g/dL (ref 31.5–35.7)
MCV: 92 fL (ref 79–97)
MONOS ABS: 0.3 10*3/uL (ref 0.1–0.9)
Monocytes: 5 %
NEUTROS PCT: 62 %
Neutrophils Absolute: 3.5 10*3/uL (ref 1.4–7.0)
PLATELETS: 240 10*3/uL (ref 150–379)
RBC: 3.97 x10E6/uL — AB (ref 4.14–5.80)
RDW: 14 % (ref 12.3–15.4)
WBC: 5.6 10*3/uL (ref 3.4–10.8)

## 2015-11-30 LAB — COMPREHENSIVE METABOLIC PANEL
A/G RATIO: 2.1 (ref 1.2–2.2)
ALBUMIN: 4.6 g/dL (ref 3.6–4.8)
ALK PHOS: 72 IU/L (ref 39–117)
ALT: 63 IU/L — ABNORMAL HIGH (ref 0–44)
AST: 36 IU/L (ref 0–40)
BILIRUBIN TOTAL: 0.5 mg/dL (ref 0.0–1.2)
BUN / CREAT RATIO: 22 (ref 10–24)
BUN: 23 mg/dL (ref 8–27)
CHLORIDE: 104 mmol/L (ref 96–106)
CO2: 23 mmol/L (ref 18–29)
CREATININE: 1.05 mg/dL (ref 0.76–1.27)
Calcium: 9.5 mg/dL (ref 8.6–10.2)
GFR calc Af Amer: 86 mL/min/{1.73_m2} (ref >59)
GFR calc non Af Amer: 74 mL/min/{1.73_m2} (ref >59)
GLOBULIN, TOTAL: 2.2 g/dL (ref 1.5–4.5)
Glucose: 109 mg/dL — ABNORMAL HIGH (ref 65–99)
POTASSIUM: 4.3 mmol/L (ref 3.5–5.2)
SODIUM: 146 mmol/L — AB (ref 134–144)
Total Protein: 6.8 g/dL (ref 6.0–8.5)

## 2015-11-30 LAB — LIPID PANEL
CHOL/HDL RATIO: 3.1 ratio (ref 0.0–5.0)
Cholesterol, Total: 141 mg/dL (ref 100–199)
HDL: 46 mg/dL (ref >39)
LDL CALC: 79 mg/dL (ref 0–99)
TRIGLYCERIDES: 80 mg/dL (ref 0–149)
VLDL Cholesterol Cal: 16 mg/dL (ref 5–40)

## 2015-11-30 LAB — HEMOGLOBIN A1C
Est. average glucose Bld gHb Est-mCnc: 91 mg/dL
HEMOGLOBIN A1C: 4.8 % (ref 4.8–5.6)

## 2015-11-30 LAB — TSH: TSH: 1.36 u[IU]/mL (ref 0.450–4.500)

## 2015-11-30 LAB — VITAMIN D 25 HYDROXY (VIT D DEFICIENCY, FRACTURES): VIT D 25 HYDROXY: 39.4 ng/mL (ref 30.0–100.0)

## 2015-12-01 ENCOUNTER — Ambulatory Visit (INDEPENDENT_AMBULATORY_CARE_PROVIDER_SITE_OTHER): Payer: Managed Care, Other (non HMO) | Admitting: Family Medicine

## 2015-12-01 ENCOUNTER — Encounter: Payer: Self-pay | Admitting: Family Medicine

## 2015-12-01 VITALS — BP 122/70 | HR 68 | Temp 97.7°F | Resp 18 | Ht 71.0 in | Wt 238.0 lb

## 2015-12-01 DIAGNOSIS — E291 Testicular hypofunction: Secondary | ICD-10-CM

## 2015-12-01 DIAGNOSIS — I10 Essential (primary) hypertension: Secondary | ICD-10-CM

## 2015-12-01 DIAGNOSIS — D649 Anemia, unspecified: Secondary | ICD-10-CM

## 2015-12-01 DIAGNOSIS — E669 Obesity, unspecified: Secondary | ICD-10-CM | POA: Diagnosis not present

## 2015-12-01 DIAGNOSIS — E039 Hypothyroidism, unspecified: Secondary | ICD-10-CM

## 2015-12-01 NOTE — Progress Notes (Signed)
Name: Alan Campbell   [name]: [id number]    DOB: 10-28-1950   Date:12/01/2015       Progress Note  Subjective  Chief Complaint  Chief Complaint  Patient presents with  . Follow-up    1 month F/U  . Hypertension    Patient was still using his old BP Medication until he finished it due to just filling it on his last visit and has not switched over to the new medication yet. But last visit changed Azor to Amlodipine and Verapamil to Metoprolol.  Patient is still having edema in bilateral ankles.   . Testicular Hypofunction    Since patient turned 56 he is unable to use coupons cards anymore even though he has commercial insurance     HPI  Hypogonadism: he is on testosterone but even on two pumps daily his testosterone level was low in December 2016, he has not been sexually active for a while - since wife had chemo. He is taking for bone mass and also improves his energy level. He is now on 3 pumps daily and we rechecked labs, labs is still pending  HTN: taking medication - still Cardizem and Lotrel, he had too many of the old medications. 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 No longer drinking sweet beverages, also cutting down on beer - at most one glass with dinner when travelling.   Anemia unspecified: he donates blood , also avoid iron rich food and supplements because of history of kidney stones. He donated blood in January and again 2 weeks, no SOB or fatigue  Patient Active Problem List   Diagnosis Date Noted  . Benign essential HTN 12/30/2014  . Dyslipidemia  12/30/2014  . Adult hypothyroidism 12/30/2014  . Dysmetabolic syndrome 123456  . Headache, migraine 12/30/2014  . Obesity (BMI 30-39.9) 12/30/2014  . Arthritis, degenerative 12/30/2014  . Perennial allergic rhinitis with seasonal variation 12/30/2014  . 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  . Patella-femoral syndrome 12/03/2014  . Low back pain 04/03/2010  . Vitamin D deficiency 04/14/2009  . Abnormal presence of protein in urine 09/12/2007  . OP (osteoporosis) 05/05/2007    Past Surgical History  Procedure Laterality Date  . Laminectomy  1985    L-5    Family History  Problem Relation Age of Onset  . Heart disease Father   . Hypothyroidism Sister   . Hypothyroidism Sister   . Migraines Sister     Social History   Social History  . Marital Status: Married    Spouse Name: N/A  . Number of Children: N/A  . Years of Education: N/A   Occupational History  . Not on file.   Social History Main Topics  . Smoking status: Never Smoker   . Smokeless tobacco: Never Used  . Alcohol Use: 2.4 oz/week    4 Standard drinks or equivalent per week  . Drug Use: No  . Sexual Activity:    Partners: Female   Other Topics Concern  . Not on file   Social History Narrative  Current outpatient prescriptions:  .  amLODipine-valsartan (EXFORGE) 10-320 MG tablet, Take 1 tablet by mouth daily., Disp: 30 tablet, Rfl: 0 .  [name] PUMP 20.25 MG/ACT (1.62%) GEL, APPLY 4 PUMPS TOPICALLY TO SKIN DAILY ASDIRECTED., Disp: 150 g, Rfl: 2 .  aspirin 81 MG chewable tablet, Chew 1 tablet by mouth daily., Disp: , Rfl:  .  Cholecalciferol (VITAMIN D) 2000 units CAPS, Take 1 capsule (2,000 Units total) by mouth daily., Disp: 30 capsule, Rfl: 0 .  fluticasone (FLONASE) 50 MCG/ACT nasal spray, Place 2 sprays into both nostrils daily., Disp: 16 g, Rfl: 6 .  metFORMIN (GLUCOPHAGE) 500 MG tablet, Take 1 tablet (500 mg total) by mouth daily., Disp: 30 tablet, Rfl: 3 .  metoprolol succinate  (TOPROL-XL) 50 MG 24 hr tablet, Take 1-2 tablets (50-100 mg total) by mouth daily. Take with or immediately following a meal. In place of Verapamil, Disp: 60 tablet, Rfl: 0 .  Multiple Vitamins-Minerals (CENTRUM SILVER 50+MEN) TABS, Take 1 tablet by mouth daily., Disp: , Rfl:  .  OMEGA-3 FATTY ACIDS PO, Take 1 tablet by mouth daily., Disp: , Rfl:  .  rosuvastatin (CRESTOR) 20 MG tablet, Take 1 tablet (20 mg total) by mouth daily., Disp: 30 tablet, Rfl: 3 .  SYNTHROID 200 MCG tablet, TAKE 1 AND 1/2 TABLETS BY MOUTH ON SUNDAY, AND 1 TABLET DAILY ON ALL OTHER DAYS. (Patient taking differently: 1/2 TABLETS BY MOUTH ON SUNDAY, AND 1 TABLET DAILY ON ALL OTHER DAYS.), Disp: 32 tablet, Rfl: 1 .  triamterene-hydrochlorothiazide (MAXZIDE-25) 37.5-25 MG tablet, Take 1 tablet by mouth daily., Disp: 30 tablet, Rfl: 3  No Known Allergies   ROS  Ten systems reviewed and is negative except as mentioned in HPI  Objective  Filed Vitals:   12/01/15 1125  BP: 122/70  Pulse: 68  Temp: 97.7 F (36.5 C)  TempSrc: Oral  Resp: 18  Height: 5\' 11"  (1.803 m)  Weight: 238 lb (107.956 kg)  SpO2: 97%    Body mass index is 33.21 kg/(m^2).  Physical Exam  Constitutional: Patient appears well-developed and well-nourished. Obese  No distress.  HEENT: head atraumatic, normocephalic, pupils equal and reactive to light,  neck supple, throat within normal limits Cardiovascular: Normal rate, regular rhythm and normal heart sounds.  No murmur heard. No BLE edema. Pulmonary/Chest: Effort normal and breath sounds normal. No respiratory distress. Abdominal: Soft.  There is no tenderness. Psychiatric: Patient has a normal mood and affect. behavior is normal. Judgment and thought content normal.  Recent Results (from the past 2160 hour(s))  VITAMIN D 25 Hydroxy (Vit-D Deficiency, Fractures)     Status: None   Collection Time: 11/29/15  8:42 AM  Result Value Ref Range   Vit D, 25-Hydroxy 39.4 30.0 - 100.0 ng/mL     Comment: Vitamin D deficiency has been defined by the [place] practice guideline as a level of serum 25-OH vitamin D less than 20 ng/mL (1,2). The Endocrine Society went on to further define vitamin D insufficiency as a level between 21 and 29 ng/mL (2). 1. IOM (Institute of Medicine). 2010. Dietary reference    intakes for calcium and D. [place]: The    [employer]. 2. Holick MF, Binkley [place], Bischoff-Ferrari HA, et al.    Evaluation, treatment, and prevention of vitamin D    deficiency: an Endocrine Society clinical practice    guideline. JCEM. 2011 Jul; 96(7):1911-30.   Hemoglobin A1c     Status: None  Collection Time: 11/29/15  8:42 AM  Result Value Ref Range   Hgb A1c MFr Bld 4.8 4.8 - 5.6 %    Comment:          Pre-diabetes: 5.7 - 6.4          Diabetes: >6.4          Glycemic control for adults with diabetes: <7.0    Est. average glucose Bld gHb Est-mCnc 91 mg/dL  Comprehensive metabolic panel     Status: Abnormal   Collection Time: 11/29/15  8:42 AM  Result Value Ref Range   Glucose 109 (H) 65 - 99 mg/dL   BUN 23 8 - 27 mg/dL   Creatinine, Ser 1.05 0.76 - 1.27 mg/dL   GFR calc non Af Amer 74 >59 mL/min/1.73   GFR calc Af Amer 86 >59 mL/min/1.73   BUN/Creatinine Ratio 22 10 - 24   Sodium 146 (H) 134 - 144 mmol/L   Potassium 4.3 3.5 - 5.2 mmol/L   Chloride 104 96 - 106 mmol/L   CO2 23 18 - 29 mmol/L   Calcium 9.5 8.6 - 10.2 mg/dL   Total Protein 6.8 6.0 - 8.5 g/dL   Albumin 4.6 3.6 - 4.8 g/dL   Globulin, Total 2.2 1.5 - 4.5 g/dL   Albumin/Globulin Ratio 2.1 1.2 - 2.2   Bilirubin Total 0.5 0.0 - 1.2 mg/dL   Alkaline Phosphatase 72 39 - 117 IU/L   AST 36 0 - 40 IU/L   ALT 63 (H) 0 - 44 IU/L  CBC with Differential/Platelet     Status: Abnormal   Collection Time: 11/29/15  8:42 AM  Result Value Ref Range   WBC 5.6 3.4 - 10.8 x10E3/uL   RBC 3.97 (L) 4.14 - 5.80 x10E6/uL   Hemoglobin 12.4 (L) 12.6 - 17.7 g/dL    Hematocrit 36.5 (L) 37.5 - 51.0 %   MCV 92 79 - 97 fL   MCH 31.2 26.6 - 33.0 pg   MCHC 34.0 31.5 - 35.7 g/dL   RDW 14.0 12.3 - 15.4 %   Platelets 240 150 - 379 x10E3/uL   Neutrophils 62 %   Lymphs 29 %   Monocytes 5 %   Eos 3 %   Basos 1 %   Neutrophils Absolute 3.5 1.4 - 7.0 x10E3/uL   Lymphocytes Absolute 1.6 0.7 - 3.1 x10E3/uL   Monocytes Absolute 0.3 0.1 - 0.9 x10E3/uL   EOS (ABSOLUTE) 0.2 0.0 - 0.4 x10E3/uL   Basophils Absolute 0.0 0.0 - 0.2 x10E3/uL   Immature Granulocytes 0 %   Immature Grans (Abs) 0.0 0.0 - 0.1 x10E3/uL  Lipid panel     Status: None   Collection Time: 11/29/15  8:42 AM  Result Value Ref Range   Cholesterol, Total 141 100 - 199 mg/dL   Triglycerides 80 0 - 149 mg/dL   HDL 46 >39 mg/dL   VLDL Cholesterol Cal 16 5 - 40 mg/dL   LDL Calculated 79 0 - 99 mg/dL   Chol/HDL Ratio 3.1 0.0 - 5.0 ratio units    Comment:                                   T. Chol/HDL Ratio  Men  Women                               1/2 Avg.Risk  3.4    3.3                                   Avg.Risk  5.0    4.4                                2X Avg.Risk  9.6    7.1                                3X Avg.Risk 23.4   11.0   TSH     Status: None   Collection Time: 11/29/15  8:45 AM  Result Value Ref Range   TSH 1.360 0.450 - 4.500 uIU/mL      PHQ2/9: Depression screen [facility] 2/9 11/04/2015 07/07/2015 05/06/2015 12/31/2014  Decreased Interest 0 0 0 0  Down, Depressed, Hopeless 0 0 0 0  PHQ - 2 Score 0 0 0 0    Fall Risk: Fall Risk  11/04/2015 07/07/2015 05/06/2015 12/31/2014  Falls in the past year? No No No No      Assessment & Plan  1. Hypothyroidism, adult  Continue current dose   2. Benign essential HTN  At goal   3. Anemia, unspecified anemia type  Check labs, try taking iron supplements the week he donates blood or a few times weekly as tolerated - Iron and TIBC - Ferritin  4. Testicular hypofunction  Labs results  pending  5. Obesity (BMI 30.0-34.9)  Discussed with the patient the risk posed by an increased BMI. Discussed importance of portion control, calorie counting and at least 150 minutes of physical activity weekly. Avoid sweet beverages and drink more water. Eat at least 6 servings of fruit and vegetables daily

## 2015-12-01 NOTE — Patient Instructions (Addendum)
Myfitness pal ap for your phone Try to stay between 1800-2000 calorie Exercise - 30 minutes daily Pack lunch when possible   Discussed medications for weight loss:  -Belviq -Saxenda -Contrave  Follow up in 6 weeks with a goal 1 lbs per week.

## 2015-12-02 ENCOUNTER — Ambulatory Visit: Payer: Managed Care, Other (non HMO) | Admitting: Family Medicine

## 2015-12-05 ENCOUNTER — Ambulatory Visit: Payer: Managed Care, Other (non HMO) | Admitting: Family Medicine

## 2015-12-07 LAB — IRON AND TIBC
IRON: 70 ug/dL (ref 38–169)
Iron Saturation: 18 % (ref 15–55)
TIBC: 386 ug/dL (ref 250–450)
UIBC: 316 ug/dL (ref 111–343)

## 2015-12-07 LAB — SPECIMEN STATUS REPORT

## 2015-12-07 LAB — FERRITIN: Ferritin: 36 ng/mL (ref 30–400)

## 2015-12-09 ENCOUNTER — Other Ambulatory Visit: Payer: Self-pay | Admitting: Emergency Medicine

## 2015-12-09 ENCOUNTER — Other Ambulatory Visit: Payer: Self-pay | Admitting: Family Medicine

## 2015-12-09 NOTE — Telephone Encounter (Signed)
Patient requesting refill. 

## 2015-12-17 ENCOUNTER — Other Ambulatory Visit: Payer: Self-pay | Admitting: Family Medicine

## 2016-01-16 ENCOUNTER — Ambulatory Visit (INDEPENDENT_AMBULATORY_CARE_PROVIDER_SITE_OTHER): Payer: Managed Care, Other (non HMO) | Admitting: Family Medicine

## 2016-01-16 ENCOUNTER — Encounter: Payer: Self-pay | Admitting: Family Medicine

## 2016-01-16 VITALS — BP 146/88 | HR 60 | Temp 97.9°F | Resp 16 | Ht 71.0 in | Wt 252.6 lb

## 2016-01-16 DIAGNOSIS — I10 Essential (primary) hypertension: Secondary | ICD-10-CM | POA: Diagnosis not present

## 2016-01-16 DIAGNOSIS — D509 Iron deficiency anemia, unspecified: Secondary | ICD-10-CM

## 2016-01-16 DIAGNOSIS — E669 Obesity, unspecified: Secondary | ICD-10-CM | POA: Diagnosis not present

## 2016-01-16 MED ORDER — VERAPAMIL HCL ER 300 MG PO CP24: 1.0000 | ORAL_CAPSULE | Freq: Every day | ORAL | 0 refills | Status: DC

## 2016-01-16 NOTE — Progress Notes (Signed)
Name: Alan Campbell   [name]: [id number]    DOB: December 17, 1950   Date:01/16/2016       Progress Note  Subjective  Chief Complaint  Chief Complaint  Patient presents with  . Follow-up    6 week F/U  . Hypertension    Last visit changed BP Medication due to edema in bilateral ankles and does not have a BP cuff to check his BP at home. Patient states with change of BP he can not tell a difference in a edema   . Obesity    Patient was wrong about weight, thinks his last scale was wrong and bought a new one but feels like he has lost 6 pounds due to being able to fit in clothes he was unable to before hand.     HPI  HTN: taking medication , he was on Cardizem and Azor for many years, but because of insurance coverage and swelling we changed to Metoprolol and Exforge, the swelling is going down, but bp today is very high, he also states Metoprolol caused him diarrhea and wants to go back to previous regiment. No chest pain or palpitation  Obesity: he states the scale at home was broken and the weight in June was not correct. He has actually lost 5 lbs since last visit ( based on his scale). Using Myfitness pal and logging his calories, trying to exercise - walking daily. 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    Patient Active Problem List   Diagnosis Date Noted  . Anemia, iron deficiency 01/16/2016  . Benign essential HTN 12/30/2014  . Dyslipidemia 12/30/2014  . Adult hypothyroidism 12/30/2014  . Dysmetabolic syndrome 123456  . Headache, migraine 12/30/2014  . Obesity (BMI 30-39.9) 12/30/2014  . Arthritis, degenerative 12/30/2014  . Perennial allergic rhinitis with seasonal variation 12/30/2014  . 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  . Patella-femoral syndrome 12/03/2014  . Low back pain 04/03/2010  . Vitamin D deficiency 04/14/2009  . Abnormal presence of protein in urine 09/12/2007  . OP (osteoporosis) 05/05/2007    Past Surgical History:   Procedure Laterality Date  . LAMINECTOMY  1985   L-5    Family History  Problem Relation Age of Onset  . Heart disease Father   . Hypothyroidism Sister   . Hypothyroidism Sister   . Migraines Sister     Social History   Social History  . Marital status: Married    Spouse name: N/A  . Number of children: N/A  . Years of education: N/A   Occupational History  . Not on file.   Social History Main Topics  . Smoking status: Never Smoker  . Smokeless tobacco: Never Used  . Alcohol use 2.4 oz/week    4 Standard drinks or equivalent per week  . Drug use: No  . Sexual activity: Yes    Partners: Female   Other Topics Concern  . Not on file   Social History Narrative  . No narrative on file     Current Outpatient Prescriptions:  .  amLODipine-valsartan (EXFORGE) 10-320 MG tablet, Take 1 tablet by mouth daily., Disp: 30 tablet, Rfl: 0 .  [name] PUMP 20.25 MG/ACT (1.62%) GEL, APPLY 4 PUMPS TOPICALLY TO SKIN DAILY ASDIRECTED., Disp: 150 g, Rfl: 2 .  aspirin 81 MG chewable tablet, Chew 1 tablet by mouth daily., Disp: , Rfl:  .  Cholecalciferol (VITAMIN D) 2000 units CAPS, Take 1 capsule (2,000 Units total) by mouth daily., Disp: 30  capsule, Rfl: 0 .  fluticasone (FLONASE) 50 MCG/ACT nasal spray, Place 2 sprays into both nostrils daily., Disp: 16 g, Rfl: 6 .  metFORMIN (GLUCOPHAGE) 500 MG tablet, Take 1 tablet (500 mg total) by mouth daily., Disp: 30 tablet, Rfl: 3 .  Multiple Vitamins-Minerals (CENTRUM SILVER 50+MEN) TABS, Take 1 tablet by mouth daily., Disp: , Rfl:  .  OMEGA-3 FATTY ACIDS PO, Take 1 tablet by mouth daily., Disp: , Rfl:  .  rosuvastatin (CRESTOR) 20 MG tablet, Take 1 tablet (20 mg total) by mouth daily., Disp: 30 tablet, Rfl: 3 .  SYNTHROID 200 MCG tablet, TAKE 1 AND 1/2 TABLETS BY MOUTH ON SUNDAY, AND 1 TABLET DAILY ON ALL OTHER DAYS., Disp: 32 tablet, Rfl: 0 .  triamterene-hydrochlorothiazide (MAXZIDE-25) 37.5-25 MG tablet, Take 1 tablet by mouth daily.,  Disp: 30 tablet, Rfl: 3 .  Verapamil HCl CR 300 MG CP24, Take 1 capsule (300 mg total) by mouth daily., Disp: 30 each, Rfl: 0  No Known Allergies   ROS  Ten systems reviewed and is negative except as mentioned in HPI   Objective  Vitals:   01/16/16 0850 01/16/16 0955  BP: (!) 162/94 (!) 146/88  Pulse: 60   Resp: 16   Temp: 97.9 F (36.6 C)   TempSrc: Oral   SpO2: 96%   Weight: 252 lb 9.6 oz (114.6 kg)   Height: 5\' 11"  (1.803 m)     Body mass index is 35.23 kg/m.  Physical Exam  Constitutional: Patient appears well-developed and well-nourished. Obese  No distress.  HEENT: head atraumatic, normocephalic, pupils equal and reactive to light,  neck supple, throat within normal limits Cardiovascular: Normal rate, regular rhythm and normal heart sounds.  No murmur heard. Trace BLE edema. Pulmonary/Chest: Effort normal and breath sounds normal. No respiratory distress. Abdominal: Soft.  There is no tenderness. Psychiatric: Patient has a normal mood and affect. behavior is normal. Judgment and thought content normal.  Recent Results (from the past 2160 hour(s))  VITAMIN D 25 Hydroxy (Vit-D Deficiency, Fractures)     Status: None   Collection Time: 11/29/15  8:42 AM  Result Value Ref Range   Vit D, 25-Hydroxy 39.4 30.0 - 100.0 ng/mL    Comment: Vitamin D deficiency has been defined by the [place] practice guideline as a level of serum 25-OH vitamin D less than 20 ng/mL (1,2). The Endocrine Society went on to further define vitamin D insufficiency as a level between 21 and 29 ng/mL (2). 1. IOM (Institute of Medicine). 2010. Dietary reference    intakes for calcium and D. [place]: The    [employer]. 2. Holick MF, Binkley [place], Bischoff-Ferrari HA, et al.    Evaluation, treatment, and prevention of vitamin D    deficiency: an Endocrine Society clinical practice    guideline. JCEM. 2011 Jul; 96(7):1911-30.   Hemoglobin  A1c     Status: None   Collection Time: 11/29/15  8:42 AM  Result Value Ref Range   Hgb A1c MFr Bld 4.8 4.8 - 5.6 %    Comment:          Pre-diabetes: 5.7 - 6.4          Diabetes: >6.4          Glycemic control for adults with diabetes: <7.0    Est. average glucose Bld gHb Est-mCnc 91 mg/dL  Comprehensive metabolic panel     Status: Abnormal   Collection Time: 11/29/15  8:42  AM  Result Value Ref Range   Glucose 109 (H) 65 - 99 mg/dL   BUN 23 8 - 27 mg/dL   Creatinine, Ser 1.05 0.76 - 1.27 mg/dL   GFR calc non Af Amer 74 >59 mL/min/1.73   GFR calc Af Amer 86 >59 mL/min/1.73   BUN/Creatinine Ratio 22 10 - 24   Sodium 146 (H) 134 - 144 mmol/L   Potassium 4.3 3.5 - 5.2 mmol/L   Chloride 104 96 - 106 mmol/L   CO2 23 18 - 29 mmol/L   Calcium 9.5 8.6 - 10.2 mg/dL   Total Protein 6.8 6.0 - 8.5 g/dL   Albumin 4.6 3.6 - 4.8 g/dL   Globulin, Total 2.2 1.5 - 4.5 g/dL   Albumin/Globulin Ratio 2.1 1.2 - 2.2   Bilirubin Total 0.5 0.0 - 1.2 mg/dL   Alkaline Phosphatase 72 39 - 117 IU/L   AST 36 0 - 40 IU/L   ALT 63 (H) 0 - 44 IU/L  CBC with Differential/Platelet     Status: Abnormal   Collection Time: 11/29/15  8:42 AM  Result Value Ref Range   WBC 5.6 3.4 - 10.8 x10E3/uL   RBC 3.97 (L) 4.14 - 5.80 x10E6/uL   Hemoglobin 12.4 (L) 12.6 - 17.7 g/dL   Hematocrit 36.5 (L) 37.5 - 51.0 %   MCV 92 79 - 97 fL   MCH 31.2 26.6 - 33.0 pg   MCHC 34.0 31.5 - 35.7 g/dL   RDW 14.0 12.3 - 15.4 %   Platelets 240 150 - 379 x10E3/uL   Neutrophils 62 %   Lymphs 29 %   Monocytes 5 %   Eos 3 %   Basos 1 %   Neutrophils Absolute 3.5 1.4 - 7.0 x10E3/uL   Lymphocytes Absolute 1.6 0.7 - 3.1 x10E3/uL   Monocytes Absolute 0.3 0.1 - 0.9 x10E3/uL   EOS (ABSOLUTE) 0.2 0.0 - 0.4 x10E3/uL   Basophils Absolute 0.0 0.0 - 0.2 x10E3/uL   Immature Granulocytes 0 %   Immature Grans (Abs) 0.0 0.0 - 0.1 x10E3/uL  Lipid panel     Status: None   Collection Time: 11/29/15  8:42 AM  Result Value Ref Range   Cholesterol,  Total 141 100 - 199 mg/dL   Triglycerides 80 0 - 149 mg/dL   HDL 46 >39 mg/dL   VLDL Cholesterol Cal 16 5 - 40 mg/dL   LDL Calculated 79 0 - 99 mg/dL   Chol/HDL Ratio 3.1 0.0 - 5.0 ratio units    Comment:                                   T. Chol/HDL Ratio                                             Men  Women                               1/2 Avg.Risk  3.4    3.3                                   Avg.Risk  5.0    4.4  2X Avg.Risk  9.6    7.1                                3X Avg.Risk 23.4   11.0   TSH     Status: None   Collection Time: 11/29/15  8:45 AM  Result Value Ref Range   TSH 1.360 0.450 - 4.500 uIU/mL  Iron and TIBC     Status: None   Collection Time: 11/29/15  8:45 AM  Result Value Ref Range   Total Iron Binding Capacity 386 250 - 450 ug/dL   UIBC 316 111 - 343 ug/dL   Iron 70 38 - 169 ug/dL   Iron Saturation 18 15 - 55 %  Ferritin     Status: None   Collection Time: 11/29/15  8:45 AM  Result Value Ref Range   Ferritin 36 30 - 400 ng/mL  Specimen status report     Status: None   Collection Time: 11/29/15  8:45 AM  Result Value Ref Range   specimen status report Comment     Comment: Written Authorization Written Authorization Written Authorization Received. Authorization received from [place] 12-07-2015 Logged by [facility]       PHQ2/9: Depression screen [facility] 2/9 01/16/2016 11/04/2015 07/07/2015 05/06/2015 12/31/2014  Decreased Interest 0 0 0 0 0  Down, Depressed, Hopeless 0 0 0 0 0  PHQ - 2 Score 0 0 0 0 0     Fall Risk: Fall Risk  01/16/2016 11/04/2015 07/07/2015 05/06/2015 12/31/2014  Falls in the past year? No No No No No     Assessment & Plan  1. Benign essential HTN  - Verapamil HCl CR 300 MG CP24; Take 1 capsule (300 mg total) by mouth daily.  Dispense: 30 each; Refill: 0  2. Obesity (BMI 30-39.9)  Discussed with the patient the risk posed by an increased BMI. Discussed importance of portion control,  calorie counting and at least 150 minutes of physical activity weekly. Avoid sweet beverages and drink more water. Eat at least 6 servings of fruit and vegetables daily   3. Anemia, iron deficiency  He donates blood every 3 months, but sometimes they don't take him, colonoscopy is up to date, advised iron supplementation, but he is worried about kidney stones.

## 2016-01-27 ENCOUNTER — Other Ambulatory Visit: Payer: Self-pay | Admitting: Family Medicine

## 2016-02-18 ENCOUNTER — Other Ambulatory Visit: Payer: Self-pay | Admitting: Family Medicine

## 2016-02-18 DIAGNOSIS — E8881 Metabolic syndrome: Secondary | ICD-10-CM

## 2016-02-27 ENCOUNTER — Encounter: Payer: Self-pay | Admitting: Family Medicine

## 2016-02-27 ENCOUNTER — Ambulatory Visit (INDEPENDENT_AMBULATORY_CARE_PROVIDER_SITE_OTHER): Payer: Managed Care, Other (non HMO) | Admitting: Family Medicine

## 2016-02-27 VITALS — BP 132/78 | HR 76 | Temp 97.7°F | Resp 18 | Ht 71.0 in | Wt 255.0 lb

## 2016-02-27 DIAGNOSIS — E039 Hypothyroidism, unspecified: Secondary | ICD-10-CM

## 2016-02-27 DIAGNOSIS — E8881 Metabolic syndrome: Secondary | ICD-10-CM

## 2016-02-27 DIAGNOSIS — Z125 Encounter for screening for malignant neoplasm of prostate: Secondary | ICD-10-CM

## 2016-02-27 DIAGNOSIS — E291 Testicular hypofunction: Secondary | ICD-10-CM | POA: Diagnosis not present

## 2016-02-27 DIAGNOSIS — I10 Essential (primary) hypertension: Secondary | ICD-10-CM

## 2016-02-27 DIAGNOSIS — E785 Hyperlipidemia, unspecified: Secondary | ICD-10-CM | POA: Diagnosis not present

## 2016-02-27 DIAGNOSIS — E669 Obesity, unspecified: Secondary | ICD-10-CM

## 2016-02-27 DIAGNOSIS — D509 Iron deficiency anemia, unspecified: Secondary | ICD-10-CM

## 2016-02-27 DIAGNOSIS — E66811 Obesity, class 1: Secondary | ICD-10-CM

## 2016-02-27 MED ORDER — ROSUVASTATIN CALCIUM 20 MG PO TABS: 20.0000 mg | ORAL_TABLET | Freq: Every day | ORAL | 3 refills | Status: DC

## 2016-02-27 MED ORDER — VERAPAMIL HCL ER 300 MG PO CP24: 1.0000 | ORAL_CAPSULE | Freq: Every day | ORAL | 1 refills | Status: DC

## 2016-02-27 MED ORDER — METFORMIN HCL 500 MG PO TABS: 500.0000 mg | ORAL_TABLET | Freq: Every day | ORAL | 1 refills | Status: DC

## 2016-02-27 MED ORDER — TRIAMTERENE-HCTZ 37.5-25 MG PO TABS: 1.0000 | ORAL_TABLET | Freq: Every day | ORAL | 1 refills | Status: DC

## 2016-02-27 MED ORDER — AMLODIPINE BESYLATE-VALSARTAN 10-320 MG PO TABS: 1.0000 | ORAL_TABLET | Freq: Every day | ORAL | 1 refills | Status: DC

## 2016-02-27 NOTE — Progress Notes (Signed)
Name: Alan Campbell   [name]: [id number]    DOB: 1951/05/10   Date:02/27/2016       Progress Note  Subjective  Chief Complaint  Chief Complaint  Patient presents with  . Hypertension    6 week follow up    HPI  Hypogonadism: he is on testosterone but even on two pumps daily his testosterone level was low in December 2016. He is on 3 pumps daily now and has no side effects.  He has not been sexually active for a while - since wife had chemo. He is taking for bone mass and also improves his energy level. He is now on 3 pumps daily and we rechecked it before next visit   HTN: taking medication - still Cardizem and Exforge, since he could not tolerate Metoprolol. 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 No longer drinking sweet beverages, also cutting down on beer - at most one glass with dinner when travelling. He still gained 2 lbs since last visit   Anemia unspecified: he donates blood , also avoid iron rich food and supplements because of history of kidney stones. Iron studies were normal, likely from chronic disease.   Metabolic syndrome: he denies polyphagia, polyuria or polydipsia. Last hgbA1C was normal    Patient Active Problem List   Diagnosis Date Noted  . Anemia, iron deficiency 01/16/2016  . Benign essential HTN 12/30/2014  . Dyslipidemia 12/30/2014  . Adult hypothyroidism 12/30/2014  . Dysmetabolic syndrome 123456  . Headache, migraine 12/30/2014  . Obesity (BMI 30-39.9) 12/30/2014  . Arthritis, degenerative 12/30/2014  . Perennial allergic rhinitis with seasonal variation  12/30/2014  . 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  . Patella-femoral syndrome 12/03/2014  . Low back pain 04/03/2010  . Vitamin D deficiency 04/14/2009  . Abnormal presence of protein in urine 09/12/2007  . OP (osteoporosis) 05/05/2007    Past Surgical History:  Procedure Laterality Date  . LAMINECTOMY  1985   L-5    Family History  Problem Relation Age of Onset  . Heart disease Father   . Hypothyroidism Sister   . Hypothyroidism Sister   . Migraines Sister     Social History   Social History  . Marital status: Married    Spouse name: N/A  . Number of children: N/A  . Years of education: N/A   Occupational History  . Not on file.   Social History Main Topics  . Smoking status: Never Smoker  . Smokeless tobacco: Never Used  . Alcohol use 2.4 oz/week    4 Standard drinks or equivalent per week  . Drug use: No  . Sexual activity: Yes    Partners: Female   Other Topics Concern  . Not on file   Social History Narrative  . No narrative on file     Current Outpatient Prescriptions:  .  amLODipine-valsartan (EXFORGE) 10-320 MG tablet, Take 1 tablet by mouth daily., Disp: 90 tablet, Rfl: 1 .  [name] PUMP 20.25 MG/ACT (1.62%) GEL, APPLY 4 PUMPS TOPICALLY TO SKIN DAILY ASDIRECTED., Disp: 150 g, Rfl: 2 .  aspirin  81 MG chewable tablet, Chew 1 tablet by mouth daily., Disp: , Rfl:  .  Cholecalciferol (VITAMIN D) 2000 units CAPS, Take 1 capsule (2,000 Units total) by mouth daily., Disp: 30 capsule, Rfl: 0 .  fluticasone (FLONASE) 50 MCG/ACT nasal spray, Place 2 sprays into both nostrils daily., Disp: 16 g, Rfl: 6 .  metFORMIN (GLUCOPHAGE) 500 MG tablet, Take 1 tablet (500 mg total) by mouth daily., Disp: 90 tablet, Rfl: 1 .  Multiple Vitamins-Minerals (CENTRUM SILVER 50+MEN) TABS, Take 1 tablet by mouth daily., Disp: , Rfl:  .  OMEGA-3 FATTY ACIDS PO, Take 1 tablet by mouth daily., Disp: , Rfl:  .  rosuvastatin (CRESTOR) 20 MG tablet, Take 1 tablet (20 mg total) by mouth  daily., Disp: 30 tablet, Rfl: 3 .  SYNTHROID 200 MCG tablet, TAKE 1 & 1/2 BY MOUTH ON SUNDAY AND 1 TABLET DAILY ON ALL OTHER DAYS, Disp: 32 tablet, Rfl: 2 .  triamterene-hydrochlorothiazide (MAXZIDE-25) 37.5-25 MG tablet, Take 1 tablet by mouth daily., Disp: 90 tablet, Rfl: 1 .  Verapamil HCl CR 300 MG CP24, Take 1 capsule (300 mg total) by mouth daily., Disp: 90 each, Rfl: 1  No Known Allergies   ROS  Constitutional: Negative for fever or significant weight change.  Respiratory: Negative for cough and shortness of breath.   Cardiovascular: Negative for chest pain or palpitations.  Gastrointestinal: Negative for abdominal pain, no bowel changes.  Musculoskeletal: Negative for gait problem or joint swelling.  Skin: Negative for rash.  Neurological: Negative for dizziness or headache.  No other specific complaints in a complete review of systems (except as listed in HPI above).  Objective  Vitals:   02/27/16 0932  BP: 132/78  Pulse: 76  Resp: 18  Temp: 97.7 F (36.5 C)  SpO2: 96%  Weight: 255 lb (115.7 kg)  Height: 5\' 11"  (1.803 m)    Body mass index is 35.57 kg/m.  Physical Exam  Constitutional: Patient appears well-developed and well-nourished. Obese  No distress.  HEENT: head atraumatic, normocephalic, pupils equal and reactive to light, neck supple, throat within normal limits Cardiovascular: Normal rate, regular rhythm and normal heart sounds.  No murmur heard. 1 plus  BLE edema. Pulmonary/Chest: Effort normal and breath sounds normal. No respiratory distress. Abdominal: Soft.  There is no tenderness. Psychiatric: Patient has a normal mood and affect. behavior is normal. Judgment and thought content normal.  PHQ2/9: Depression screen [facility] 2/9 02/27/2016 01/16/2016 11/04/2015 07/07/2015 05/06/2015  Decreased Interest 0 0 0 0 0  Down, Depressed, Hopeless 0 0 0 0 0  PHQ - 2 Score 0 0 0 0 0    Fall Risk: Fall Risk  02/27/2016 01/16/2016 11/04/2015 07/07/2015 05/06/2015   Falls in the past year? No No No No No      Assessment & Plan  1. Benign essential HTN  - Verapamil HCl CR 300 MG CP24; Take 1 capsule (300 mg total) by mouth daily.  Dispense: 90 each; Refill: 1 - triamterene-hydrochlorothiazide (MAXZIDE-25) 37.5-25 MG tablet; Take 1 tablet by mouth daily.  Dispense: 90 tablet; Refill: 1 - amLODipine-valsartan (EXFORGE) 10-320 MG tablet; Take 1 tablet by mouth daily.  Dispense: 90 tablet; Refill: 1 - COMPLETE METABOLIC PANEL WITH GFR  2. Anemia, iron deficiency  Donating blood, last iron studies normal   3. Hypothyroidism, adult  - TSH  4. Obesity (BMI 30.0-34.9)  Discussed with the patient the risk posed by an increased BMI. Discussed importance of portion control, calorie counting and at least 150 minutes  of physical activity weekly. Avoid sweet beverages and drink more water. Eat at least 6 servings of fruit and vegetables daily   5. Dyslipidemia  - rosuvastatin (CRESTOR) 20 MG tablet; Take 1 tablet (20 mg total) by mouth daily.  Dispense: 30 tablet; Refill: 3  6. Dysmetabolic syndrome  - metFORMIN (GLUCOPHAGE) 500 MG tablet; Take 1 tablet (500 mg total) by mouth daily.  Dispense: 90 tablet; Refill: 1  7. Prostate cancer screening  - PSA

## 2016-03-31 ENCOUNTER — Other Ambulatory Visit: Payer: Self-pay | Admitting: Family Medicine

## 2016-05-01 ENCOUNTER — Other Ambulatory Visit: Payer: Self-pay

## 2016-05-01 MED ORDER — ANDROGEL PUMP 20.25 MG/ACT (1.62%) TD GEL: TRANSDERMAL | 2 refills | Status: DC

## 2016-05-01 NOTE — Telephone Encounter (Signed)
Patient requesting refill of [name] be sent to [facility].

## 2016-05-21 ENCOUNTER — Other Ambulatory Visit: Payer: Self-pay

## 2016-05-21 DIAGNOSIS — Z0000 Encounter for general adult medical examination without abnormal findings: Secondary | ICD-10-CM

## 2016-05-30 ENCOUNTER — Encounter: Payer: Self-pay | Admitting: Family Medicine

## 2016-05-30 ENCOUNTER — Ambulatory Visit (INDEPENDENT_AMBULATORY_CARE_PROVIDER_SITE_OTHER): Payer: Managed Care, Other (non HMO) | Admitting: Family Medicine

## 2016-05-30 VITALS — BP 138/78 | HR 73 | Temp 98.2°F | Resp 16 | Ht 70.0 in | Wt 257.1 lb

## 2016-05-30 DIAGNOSIS — Z23 Encounter for immunization: Secondary | ICD-10-CM

## 2016-05-30 DIAGNOSIS — Z0000 Encounter for general adult medical examination without abnormal findings: Secondary | ICD-10-CM

## 2016-05-30 DIAGNOSIS — E8881 Metabolic syndrome: Secondary | ICD-10-CM

## 2016-05-30 DIAGNOSIS — E291 Testicular hypofunction: Secondary | ICD-10-CM

## 2016-05-30 DIAGNOSIS — Z0001 Encounter for general adult medical examination with abnormal findings: Secondary | ICD-10-CM | POA: Diagnosis not present

## 2016-05-30 DIAGNOSIS — Q5310 Unspecified undescended testicle, unilateral: Secondary | ICD-10-CM

## 2016-05-30 NOTE — Progress Notes (Signed)
Name: Alan Campbell   [name]: [id number]    DOB: [birth year]   Date:05/30/2016       Progress Note  Subjective  Chief Complaint  Chief Complaint  Patient presents with  . Annual Exam    HPI  Male exam: he is not sexually active at this time, wife is not interested since she had breast cancer. He denies bowel changes. He has good exercise tolerance, able to go up a flight of stairs without sob. He has been more active, walking more.   Metabolic syndrome: his fasting glucose was 115, we will try to add insulin level to his next  labs, he denies polyphagia, polyuria or polydipsia . He has gained a few pounds since last visit. He is compliant with Metformin    IPSS Questionnaire (AUA-7): Over the past month.   1)  How often have you had a sensation of not emptying your bladder completely after you finish urinating?  1 - Less than 1 time in 5  2)  How often have you had to urinate again less than two hours after you finished urinating? 1 - Less than 1 time in 5  3)  How often have you found you stopped and started again several times when you urinated?  0 - Not at all  4) How difficult have you found it to postpone urination?  0 - Not at all  5) How often have you had a weak urinary stream?  0 - Not at all  6) How often have you had to push or strain to begin urination?  0 - Not at all  7) How many times did you most typically get up to urinate from the time you went to bed until the time you got up in the morning?  1 - 1 time  Total score:  0-7 mildly symptomatic   8-19 moderately symptomatic   20-35 severely symptomatic    Patient Active Problem List   Diagnosis Date Noted  . Anemia, iron deficiency 01/16/2016  . Benign essential HTN 12/30/2014  . Dyslipidemia 12/30/2014  . Adult hypothyroidism 12/30/2014  . Dysmetabolic syndrome 123456  . Headache, migraine 12/30/2014  . Obesity (BMI 30-39.9) 12/30/2014  . Arthritis, degenerative 12/30/2014  . Perennial allergic rhinitis with  seasonal variation 12/30/2014  . 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  . Patella-femoral syndrome 12/03/2014  . Low back pain 04/03/2010  . Vitamin D deficiency 04/14/2009  . Abnormal presence of protein in urine 09/12/2007  . OP (osteoporosis) 05/05/2007    Past Surgical History:  Procedure Laterality Date  . LAMINECTOMY  1985   L-5    Family History  Problem Relation Age of Onset  . Heart disease Father   . Hypothyroidism Sister   . Hypothyroidism Sister   . Migraines Sister     Social History   Social History  . Marital status: Married    Spouse name: N/A  . Number of children: N/A  . Years of education: N/A   Occupational History  . Not on file.   Social History Main Topics  . Smoking status: Never Smoker  . Smokeless tobacco: Never Used  . Alcohol use 2.4 oz/week    4 Standard drinks or equivalent per week  . Drug use: No  . Sexual activity: Yes    Partners: Female   Other Topics Concern  . Not on file   Social History Narrative  . No narrative on file     Current Outpatient Prescriptions:  .  amLODipine-valsartan (  EXFORGE) 10-320 MG tablet, Take 1 tablet by mouth daily., Disp: 90 tablet, Rfl: 1 .  [name] PUMP 20.25 MG/ACT (1.62%) GEL, APPLY 4 PUMPS TOPICALLY TO SKIN DAILY ASDIRECTED., Disp: 150 g, Rfl: 2 .  aspirin 81 MG chewable tablet, Chew 1 tablet by mouth daily., Disp: , Rfl:  .  BESIVANCE 0.6 % SUSP, , Disp: , Rfl:  .  Cholecalciferol (VITAMIN D) 2000 units CAPS, Take 1 capsule (2,000 Units total) by mouth daily., Disp: 30 capsule, Rfl: 0 .  DUREZOL 0.05 % EMUL, , Disp: , Rfl:  .  fluticasone (FLONASE) 50 MCG/ACT nasal spray, Place 2 sprays into both nostrils daily., Disp: 16 g, Rfl: 6 .  metFORMIN (GLUCOPHAGE) 500 MG tablet, Take 1 tablet (500 mg total) by mouth daily., Disp: 90 tablet, Rfl: 1 .  Multiple Vitamins-Minerals (CENTRUM SILVER 50+MEN) TABS, Take 1 tablet by mouth daily., Disp: , Rfl:  .  OMEGA-3 FATTY ACIDS PO, Take 1 tablet by  mouth daily., Disp: , Rfl:  .  PROLENSA 0.07 % SOLN, , Disp: , Rfl:  .  rosuvastatin (CRESTOR) 20 MG tablet, Take 1 tablet (20 mg total) by mouth daily., Disp: 30 tablet, Rfl: 3 .  SYNTHROID 200 MCG tablet, TAKE 1 AND 1/2 TABLET BY MOUTH ON SUNDAYAND TAKE 1 TABLET ONCE DAILY ON OTHER DAYS, Disp: 32 tablet, Rfl: 2 .  triamterene-hydrochlorothiazide (MAXZIDE-25) 37.5-25 MG tablet, Take 1 tablet by mouth daily., Disp: 90 tablet, Rfl: 1 .  Verapamil HCl CR 300 MG CP24, Take 1 capsule (300 mg total) by mouth daily., Disp: 90 each, Rfl: 1  No Known Allergies   ROS  Constitutional: Negative for fever or weight change.  Respiratory: Negative for cough and shortness of breath.   Cardiovascular: Negative for chest pain or palpitations.  Gastrointestinal: Negative for abdominal pain, no bowel changes.  Musculoskeletal: Negative for gait problem or joint swelling.  Skin: Negative for rash.   Neurological: Negative for dizziness or headache.  No other specific complaints in a complete review of systems (except as listed in HPI above).  Objective  Vitals:   05/30/16 0831  BP: 138/78  Pulse: 73  Resp: 16  Temp: 98.2 F (36.8 C)  TempSrc: Oral  SpO2: 96%  Weight: 257 lb 2 oz (116.6 kg)  Height: 5\' 10"  (1.778 m)    Body mass index is 36.89 kg/m.  Physical Exam  Constitutional: Patient appears well-developed and well-nourished.Obese.  No distress.  HENT: Head: Normocephalic and atraumatic. Ears: B TMs ok, no erythema or effusion; Nose: Nose normal. Mouth/Throat: Oropharynx is clear and moist. No oropharyngeal exudate.  Eyes: Conjunctivae and EOM are normal. Pupils are equal, round, and reactive to light. No scleral icterus.  Neck: Normal range of motion. Neck supple. No JVD present. No thyromegaly present.  Cardiovascular: Normal rate, regular rhythm and normal heart sounds.  No murmur heard. Trace non pitting BLE edema. Pulmonary/Chest: Effort normal and breath sounds normal. No  respiratory distress. Abdominal: Soft. Bowel sounds are normal, no distension. There is no tenderness. no masses, he has lipomas on abdominal wall-present for years and non-tender MALE GENITALIA: right testicle is undescended no masses palpated, no hernias, no lesions, no discharge RECTAL: Prostate normal size and consistency, no rectal masses or hemorrhoids Musculoskeletal: Normal range of motion, no joint effusions. No gross deformities Neurological: he is alert and oriented to person, place, and time. No cranial nerve deficit. Coordination, balance, strength, speech and gait are normal.  Skin: Skin is warm and dry. No rash  noted. No erythema.  Psychiatric: Patient has a normal mood and affect. behavior is normal. Judgment and thought content normal.  PHQ2/9: Depression screen [facility] 2/9 05/30/2016 02/27/2016 01/16/2016 11/04/2015 07/07/2015  Decreased Interest 0 0 0 0 0  Down, Depressed, Hopeless 0 0 0 0 0  PHQ - 2 Score 0 0 0 0 0     Fall Risk: Fall Risk  05/30/2016 02/27/2016 01/16/2016 11/04/2015 07/07/2015  Falls in the past year? No No No No No     Functional Status Survey: Is the patient deaf or have difficulty hearing?: No Does the patient have difficulty seeing, even when wearing glasses/contacts?: Yes (having surgery on 06/19/16 on right eye) Does the patient have difficulty concentrating, remembering, or making decisions?: No Does the patient have difficulty walking or climbing stairs?: No Does the patient have difficulty dressing or bathing?: No Does the patient have difficulty doing errands alone such as visiting a doctor's office or shopping?: No    Assessment & Plan  1. Encounter for routine history and physical exam for male  Discussed importance of 150 minutes of physical activity weekly, eat two servings of fish weekly, eat one serving of tree nuts ( cashews, pistachios, pecans, almonds.Marland Kitchen) every other day, eat 6 servings of fruit/vegetables daily and drink plenty of water  and avoid sweet beverages. Continue aspirin 81 mg daily   2. Need for pneumococcal vaccination  - Pneumococcal conjugate vaccine 13-valent IM  3. Testicular hypofunction  Reviewed labs, Testosterone is at goal, continue current dose of supplementation  4. Dysmetabolic syndrome  Needs to follow a strict diet, we will order insulin on his next visit  5. Undescended right testicle  - Ambulatory referral to Urology

## 2016-06-01 ENCOUNTER — Encounter: Payer: Self-pay | Admitting: Family Medicine

## 2016-06-22 ENCOUNTER — Encounter: Payer: Self-pay | Admitting: Urology

## 2016-06-22 ENCOUNTER — Ambulatory Visit (INDEPENDENT_AMBULATORY_CARE_PROVIDER_SITE_OTHER): Payer: Managed Care, Other (non HMO) | Admitting: Urology

## 2016-06-22 VITALS — BP 153/84 | HR 65 | Ht 70.0 in

## 2016-06-22 DIAGNOSIS — N503 Cyst of epididymis: Secondary | ICD-10-CM

## 2016-06-22 DIAGNOSIS — Q5522 Retractile testis: Secondary | ICD-10-CM | POA: Diagnosis not present

## 2016-06-22 DIAGNOSIS — E291 Testicular hypofunction: Secondary | ICD-10-CM | POA: Diagnosis not present

## 2016-06-22 NOTE — Progress Notes (Signed)
06/22/2016 3:52 PM   Alan Campbell Jun 27, 1950 [id number]  Referring provider: [name], MD [address] [place], [place] [address]  Chief Complaint  Patient presents with  . New Patient (Initial Visit)    testicle    HPI: 66 year old male referred for further evaluation of possible right undescended testicle. He notes that on his most recent physical exam by Dr. Ancil Boozer, his right testicle was nonpalpable. He does not ever recall having this issue in the past. He's never been told that he has an undescended testicle. He has some difficulty with self palpation exam.  He denies any scrotal pain, discomfort, or trauma.  He denies any urinary symptoms including urinary frequency, urgency, gross hematuria, urinary tract infections.   Most recent PSA 1.2 on 05/23/16.  Rectal exam has not performed this year.  He does also have a history of hypogonadism and has been on testosterone replacement therapy in the form of [name] for many years. His most recent testosterone levels have been therapeutic.      PMH: Past Medical History:  Diagnosis Date  . Allergy   . Hyperlipidemia   . Hypertension   . Low back pain   . Migraine   . Obesity   . Thyroid disease   . Vitamin D deficiency     Surgical History: Past Surgical History:  Procedure Laterality Date  . LAMINECTOMY  1985   L-5    Home Medications:  Allergies as of 06/22/2016   No Known Allergies     Medication List       Accurate as of 06/22/16  3:52 PM. Always use your most recent med list.          amLODipine-valsartan 10-320 MG tablet Commonly known as:  EXFORGE Take 1 tablet by mouth daily.   [name] PUMP 20.25 MG/ACT (1.62%) Gel Generic drug:  Testosterone APPLY 4 PUMPS TOPICALLY TO SKIN DAILY ASDIRECTED.   aspirin 81 MG chewable tablet Chew 1 tablet by mouth daily.   BESIVANCE 0.6 % Susp Generic drug:  Besifloxacin HCl   CENTRUM SILVER 50+MEN Tabs Take 1 tablet by mouth daily.   DUREZOL 0.05 % Emul Generic drug:  Difluprednate   fluticasone 50 MCG/ACT nasal spray Commonly known as:  FLONASE Place 2 sprays into both nostrils daily.   metFORMIN 500 MG tablet Commonly known as:  GLUCOPHAGE Take 1 tablet (500 mg total) by mouth daily.   OMEGA-3 FATTY ACIDS PO Take 1 tablet by mouth daily.   PROLENSA 0.07 % Soln Generic drug:  Bromfenac Sodium   rosuvastatin 20 MG tablet Commonly known as:  CRESTOR Take 1 tablet (20 mg total) by mouth daily.   SYNTHROID 200 MCG tablet Generic drug:  levothyroxine TAKE 1 AND 1/2 TABLET BY MOUTH ON SUNDAYAND TAKE 1 TABLET ONCE DAILY ON OTHER DAYS   triamterene-hydrochlorothiazide 37.5-25 MG tablet Commonly known as:  MAXZIDE-25 Take 1 tablet by mouth daily.   Verapamil HCl CR 300 MG Cp24 Take 1 capsule (300 mg total) by mouth daily.   Vitamin D 2000 units Caps Take 1 capsule (2,000 Units total) by mouth daily.       Allergies: No Known Allergies  Family History: Family History  Problem Relation Age of Onset  . Heart disease Father   . Hypothyroidism Sister   . Hypothyroidism Sister   . Migraines Sister   . Prostate cancer Neg Hx   . Bladder Cancer Neg Hx   . Kidney cancer Neg Hx  Social History:  reports that he has never smoked. He has never used smokeless tobacco. He reports that he drinks about 2.4 oz of alcohol per week . He reports that he does not use drugs.  ROS: UROLOGY Frequent Urination?: No Hard to postpone urination?: No Burning/pain with urination?: No Get up at night to urinate?: No Leakage of urine?: No Urine stream starts and stops?: No Trouble starting stream?: No Do you have to strain to urinate?: No Blood in urine?: No Urinary tract infection?: No Sexually transmitted disease?: No Injury to kidneys or bladder?: No Painful intercourse?: No Weak stream?: No Erection problems?: No Penile pain?: No  Gastrointestinal Nausea?: No Vomiting?: No Indigestion/heartburn?:  No Diarrhea?: No Constipation?: No  Constitutional Fever: No Night sweats?: No Weight loss?: No Fatigue?: No  Skin Skin rash/lesions?: No Itching?: No  Eyes Blurred vision?: No Double vision?: No  Ears/Nose/Throat Sore throat?: No Sinus problems?: No  Hematologic/Lymphatic Swollen glands?: No Easy bruising?: No  Cardiovascular Leg swelling?: No Chest pain?: No  Respiratory Cough?: No Shortness of breath?: No  Endocrine Excessive thirst?: No  Musculoskeletal Back pain?: No Joint pain?: No  Neurological Headaches?: No Dizziness?: No  Psychologic Depression?: No Anxiety?: No  Physical Exam: BP (!) 153/84 (BP Location: Right Arm, Patient Position: Sitting, Cuff Size: Large)   Pulse 65   Ht 5\' 10"  (1.778 m)   Constitutional:  Alert and oriented, No acute distress. HEENT: [place] AT, moist mucus membranes.  Trachea midline, no masses. Cardiovascular: No clubbing, cyanosis, or edema. Respiratory: Normal respiratory effort, no increased work of breathing. GI: Abdomen is soft, nontender, nondistended, no abdominal masses.  Obese, sizable suprapubic fat pad. GU: Buried phallus. Bilateral descended testicles, palpable on each side. No testicular tenderness or tumors. 1.5 cm right epididymal cyst appreciated, nontender. Otherwise scrotal exam is unremarkable today. Rectal exam: Normal sphincter tone. Mildly enlarged prostate, nontender, no nodules. Skin: No rashes, bruises or suspicious lesions. Lymph: No cervical or inguinal adenopathy. Neurologic: Grossly intact, no focal deficits, moving all 4 extremities. Psychiatric: Normal mood and affect.  Laboratory Data: Lab Results  Component Value Date   WBC 5.6 11/29/2015   HCT 36.5 (L) 11/29/2015   MCV 92 11/29/2015   PLT 240 11/29/2015   Most recent PSA as above.  Pertinent Imaging: n/a  Assessment & Plan:    1. Retractile testis Testicle easily palpable today within the dependent scrotum, suspect possible  retractile testes. This is non-bothersome to him. This does not increase his risk for testicular cancer therefore no indication for orchiectomy.  2. Hypogonadism in male Appropriately managed by Dr. Ancil Boozer. Recommend annual rectal exam given testosterone replacement therapy PSA stable, low  3. Epididymal cyst Right asymptomatic epididymal cyst on exam, nontender, no intervention recommended.  F/u prn  Hollice Espy, MD  [facility] [address], [place], [place] [address] [phone]

## 2016-06-24 ENCOUNTER — Encounter: Payer: Self-pay | Admitting: Family Medicine

## 2016-06-24 DIAGNOSIS — N503 Cyst of epididymis: Secondary | ICD-10-CM | POA: Insufficient documentation

## 2016-07-21 ENCOUNTER — Other Ambulatory Visit: Payer: Self-pay | Admitting: Family Medicine

## 2016-07-23 NOTE — Telephone Encounter (Signed)
Patient requesting refill of Synthroid to [facility].  

## 2016-08-22 ENCOUNTER — Other Ambulatory Visit: Payer: Self-pay | Admitting: Family Medicine

## 2016-08-22 DIAGNOSIS — E785 Hyperlipidemia, unspecified: Secondary | ICD-10-CM

## 2016-08-29 ENCOUNTER — Ambulatory Visit: Payer: Managed Care, Other (non HMO) | Admitting: Family Medicine

## 2016-09-07 ENCOUNTER — Encounter: Payer: Self-pay | Admitting: Family Medicine

## 2016-09-07 ENCOUNTER — Ambulatory Visit (INDEPENDENT_AMBULATORY_CARE_PROVIDER_SITE_OTHER): Payer: Managed Care, Other (non HMO) | Admitting: Family Medicine

## 2016-09-07 VITALS — BP 118/74 | HR 63 | Temp 97.5°F | Resp 18 | Ht 70.0 in | Wt 256.0 lb

## 2016-09-07 DIAGNOSIS — E8881 Metabolic syndrome: Secondary | ICD-10-CM | POA: Diagnosis not present

## 2016-09-07 DIAGNOSIS — E785 Hyperlipidemia, unspecified: Secondary | ICD-10-CM | POA: Diagnosis not present

## 2016-09-07 DIAGNOSIS — J302 Other seasonal allergic rhinitis: Secondary | ICD-10-CM

## 2016-09-07 DIAGNOSIS — D509 Iron deficiency anemia, unspecified: Secondary | ICD-10-CM

## 2016-09-07 DIAGNOSIS — E039 Hypothyroidism, unspecified: Secondary | ICD-10-CM

## 2016-09-07 DIAGNOSIS — E669 Obesity, unspecified: Secondary | ICD-10-CM

## 2016-09-07 DIAGNOSIS — I10 Essential (primary) hypertension: Secondary | ICD-10-CM | POA: Diagnosis not present

## 2016-09-07 DIAGNOSIS — J3089 Other allergic rhinitis: Secondary | ICD-10-CM

## 2016-09-07 MED ORDER — VERAPAMIL HCL ER 300 MG PO CP24: 1.0000 | ORAL_CAPSULE | Freq: Every day | ORAL | 1 refills | Status: DC

## 2016-09-07 MED ORDER — SEMAGLUTIDE (1 MG/DOSE) 2 MG/1.5ML ~~LOC~~ SOPN: 1.0000 mg | PEN_INJECTOR | SUBCUTANEOUS | 2 refills | Status: DC

## 2016-09-07 MED ORDER — AMLODIPINE BESYLATE-VALSARTAN 10-320 MG PO TABS: 1.0000 | ORAL_TABLET | Freq: Every day | ORAL | 1 refills | Status: DC

## 2016-09-07 MED ORDER — TRIAMTERENE-HCTZ 37.5-25 MG PO TABS: 1.0000 | ORAL_TABLET | Freq: Every day | ORAL | 1 refills | Status: DC

## 2016-09-07 NOTE — Progress Notes (Signed)
Name: Alan Campbell   [name]: [id number]    DOB: [birth year]   Date:09/07/2016       Progress Note  Subjective  Chief Complaint  Chief Complaint  Patient presents with  . Medication Refill    3 month F/U  . Hypertension    Denies any symptoms  . Hypothyroidism  . Obesity    Stable  . Anemia    Has not had donate blood recently    HPI   HTN: taking medication, no side effects, no chest pain, no palpitation. BP is slightly low , but no dizziness.   Hypothyroidism: still has dry skin, no constipation, taking medication as prescribed, last TSH was at goal  Dyslipidemia: taking Crestor daily, no myalgias chest pain or palpitation   Osteoporosis: took Forteo for 2 years, not on any medication, advised to take Vitamin D otc, high dairy intake per day.   Metabolic Syndrome: he has been taking Metformin for years, but has diarrhea and is down to one pill daily, we will recheck labs, discussed changing therapy, we will try Ozempic   Obesity: problems started around his 87's. He avoid sweet beverages, decreased beer intake, avoid carbohydrates but is getting frustrated because he gained almost 26 lbs in the past year. Discussed options and we will try GLP-1 agonist.   Anemia: he donates blood and sometimes not allowed to donate because of low HCT, we will recheck labs.   Patient Active Problem List   Diagnosis Date Noted  . Epididymal cyst 06/24/2016  . Anemia, iron deficiency 01/16/2016  . Benign essential HTN 12/30/2014  . Dyslipidemia 12/30/2014  . Adult hypothyroidism 12/30/2014  . Dysmetabolic syndrome 46/27/0350  . Headache, migraine 12/30/2014  . Obesity (BMI 30-39.9) 12/30/2014  . Arthritis, degenerative 12/30/2014  . Perennial allergic rhinitis with seasonal variation 12/30/2014  . 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  . Patella-femoral syndrome 12/03/2014  . Low back pain 04/03/2010  . Vitamin D deficiency 04/14/2009  . Abnormal presence of protein in urine 09/12/2007   . OP (osteoporosis) 05/05/2007    Past Surgical History:  Procedure Laterality Date  . LAMINECTOMY  1985   L-5    Family History  Problem Relation Age of Onset  . Heart disease Father   . Hypothyroidism Sister   . Hypothyroidism Sister   . Migraines Sister   . Prostate cancer Neg Hx   . Bladder Cancer Neg Hx   . Kidney cancer Neg Hx     Social History   Social History  . Marital status: Married    Spouse name: N/A  . Number of children: N/A  . Years of education: N/A   Occupational History  . Not on file.   Social History Main Topics  . Smoking status: Never Smoker  . Smokeless tobacco: Never Used  . Alcohol use 2.4 oz/week    4 Standard drinks or equivalent per week  . Drug use: No  . Sexual activity: Yes    Partners: Female   Other Topics Concern  . Not on file   Social History Narrative  . No narrative on file     Current Outpatient Prescriptions:  .  amLODipine-valsartan (EXFORGE) 10-320 MG tablet, Take 1 tablet by mouth daily., Disp: 90 tablet, Rfl: 1 .  [name] PUMP 20.25 MG/ACT (1.62%) GEL, APPLY 4 PUMPS TOPICALLY TO SKIN DAILY ASDIRECTED., Disp: 150 g, Rfl: 2 .  aspirin 81 MG chewable tablet, Chew 1 tablet by mouth daily., Disp: , Rfl:  .  Cholecalciferol (VITAMIN D)  2000 units CAPS, Take 1 capsule (2,000 Units total) by mouth daily., Disp: 30 capsule, Rfl: 0 .  Multiple Vitamins-Minerals (CENTRUM SILVER 50+MEN) TABS, Take 1 tablet by mouth daily., Disp: , Rfl:  .  OMEGA-3 FATTY ACIDS PO, Take 1 tablet by mouth daily., Disp: , Rfl:  .  rosuvastatin (CRESTOR) 20 MG tablet, TAKE 1 TABLET BY MOUTH DAILY., Disp: 30 tablet, Rfl: 5 .  SYNTHROID 200 MCG tablet, TAKE 1 AND 1/2 TABLET BY MOUTH ON SUNDAYAND TAKE 1 TABLET ONCE DAILY ON OTHER DAYS, Disp: 32 tablet, Rfl: 2 .  triamterene-hydrochlorothiazide (MAXZIDE-25) 37.5-25 MG tablet, Take 1 tablet by mouth daily., Disp: 90 tablet, Rfl: 1 .  Verapamil HCl CR 300 MG CP24, Take 1 capsule (300 mg total) by  mouth daily., Disp: 90 each, Rfl: 1 .  Semaglutide (OZEMPIC) 1 MG/DOSE SOPN, Inject 1 mg into the skin once a week., Disp: 3 mL, Rfl: 2  No Known Allergies   ROS  Constitutional: Negative for fever or weight change.  Respiratory: Negative for cough and shortness of breath.   Cardiovascular: Negative for chest pain or palpitations.  Gastrointestinal: Negative for abdominal pain, no bowel changes.  Musculoskeletal: Negative for gait problem or joint swelling.  Skin: Negative for rash.  Neurological: Negative for dizziness or headache.  No other specific complaints in a complete review of systems (except as listed in HPI above).  Objective  Vitals:   09/07/16 1532  BP: 118/74  Pulse: 63  Resp: 18  Temp: 97.5 F (36.4 C)  TempSrc: Oral  SpO2: 97%  Weight: 256 lb (116.1 kg)  Height: 5\' 10"  (1.778 m)    Body mass index is 36.73 kg/m.  Physical Exam  Constitutional: Patient appears well-developed and well-nourished. Obese  No distress.  HEENT: head atraumatic, normocephalic, pupils equal and reactive to light,  neck supple, throat within normal limits Cardiovascular: Normal rate, regular rhythm and normal heart sounds.  No murmur heard. Trace BLE edema. Pulmonary/Chest: Effort normal and breath sounds normal. No respiratory distress. Abdominal: Soft.  There is no tenderness. Psychiatric: Patient has a normal mood and affect. behavior is normal. Judgment and thought content normal.  PHQ2/9: Depression screen [facility] 2/9 09/07/2016 05/30/2016 02/27/2016 01/16/2016 11/04/2015  Decreased Interest 0 0 0 0 0  Down, Depressed, Hopeless 0 0 0 0 0  PHQ - 2 Score 0 0 0 0 0     Fall Risk: Fall Risk  09/07/2016 05/30/2016 02/27/2016 01/16/2016 11/04/2015  Falls in the past year? No No No No No     Functional Status Survey: Is the patient deaf or have difficulty hearing?: No Does the patient have difficulty seeing, even when wearing glasses/contacts?: No Does the patient have difficulty  concentrating, remembering, or making decisions?: No Does the patient have difficulty walking or climbing stairs?: No Does the patient have difficulty dressing or bathing?: No Does the patient have difficulty doing errands alone such as visiting a doctor's office or shopping?: No    Assessment & Plan  1. Benign essential HTN  bp is towards low end of normal , he denies dizziness.  - amLODipine-valsartan (EXFORGE) 10-320 MG tablet; Take 1 tablet by mouth daily.  Dispense: 90 tablet; Refill: 1 - Verapamil HCl CR 300 MG CP24; Take 1 capsule (300 mg total) by mouth daily.  Dispense: 90 each; Refill: 1 - triamterene-hydrochlorothiazide (MAXZIDE-25) 37.5-25 MG tablet; Take 1 tablet by mouth daily.  Dispense: 90 tablet; Refill: 1  2. Dysmetabolic syndrome  - Hemoglobin A1c - Insulin,  fasting - COMPLETE METABOLIC PANEL WITH GFR - Semaglutide (OZEMPIC) 1 MG/DOSE SOPN; Inject 1 mg into the skin once a week.  Dispense: 3 mL; Refill: 2 He has diarrhea with Metformin we will try Ozempic, discussed possible side effects. Negative family history for thyroid cancer  3. Hypothyroidism, adult  Continue supplementation   4. Dyslipidemia  Last labs 11/2015  5. Perennial allergic rhinitis with seasonal variation  Stable  6. Obesity (BMI 30-39.9)  We will try Ozempic - Insulin, fasting

## 2016-09-14 ENCOUNTER — Telehealth: Payer: Self-pay

## 2016-09-14 LAB — CBC WITH DIFFERENTIAL/PLATELET
BASOS ABS: 55 {cells}/uL (ref 0–200)
Basophils Relative: 1 %
EOS PCT: 3 %
Eosinophils Absolute: 165 cells/uL (ref 15–500)
HCT: 38.8 % (ref 38.5–50.0)
Hemoglobin: 13.4 g/dL (ref 13.2–17.1)
Lymphocytes Relative: 22 %
Lymphs Abs: 1210 cells/uL (ref 850–3900)
MCH: 31.2 pg (ref 27.0–33.0)
MCHC: 34.5 g/dL (ref 32.0–36.0)
MCV: 90.4 fL (ref 80.0–100.0)
MONOS PCT: 7 %
MPV: 8.9 fL (ref 7.5–12.5)
Monocytes Absolute: 385 cells/uL (ref 200–950)
NEUTROS PCT: 67 %
Neutro Abs: 3685 cells/uL (ref 1500–7800)
PLATELETS: 203 10*3/uL (ref 140–400)
RBC: 4.29 MIL/uL (ref 4.20–5.80)
RDW: 14.1 % (ref 11.0–15.0)
WBC: 5.5 10*3/uL (ref 3.8–10.8)

## 2016-09-14 NOTE — Telephone Encounter (Signed)
Patient came in during his labs to inform us Ozempic is not covered by his Insurance. Kristeen Miss performed a prior authorization but this medication was denied due to the patient having to have a trial and failure of Bydureon, Byetta and Trulicity. Patient has already started the Ozempic sample given while in the office and his wife agreed this would be a good medication for him. But due to coverage please switch to preferred medication.

## 2016-09-15 LAB — COMPLETE METABOLIC PANEL WITHOUT GFR
ALT: 60 U/L — ABNORMAL HIGH (ref 9–46)
AST: 32 U/L (ref 10–35)
Albumin: 4.1 g/dL (ref 3.6–5.1)
Alkaline Phosphatase: 65 U/L (ref 40–115)
BUN: 28 mg/dL — ABNORMAL HIGH (ref 7–25)
CO2: 26 mmol/L (ref 20–31)
Calcium: 9.7 mg/dL (ref 8.6–10.3)
Chloride: 107 mmol/L (ref 98–110)
Creat: 1.04 mg/dL (ref 0.70–1.25)
GFR, Est African American: 86 mL/min (ref >=60)
GFR, Est Non African American: 74 mL/min (ref >=60)
Glucose, Bld: 126 mg/dL — ABNORMAL HIGH (ref 65–99)
Potassium: 4.2 mmol/L (ref 3.5–5.3)
Sodium: 143 mmol/L (ref 135–146)
Total Bilirubin: 0.8 mg/dL (ref 0.2–1.2)
Total Protein: 6.1 g/dL (ref 6.1–8.1)

## 2016-09-15 LAB — HEMOGLOBIN A1C
Hgb A1c MFr Bld: 4.7 % (ref <5.7)
MEAN PLASMA GLUCOSE: 88 mg/dL

## 2016-09-15 LAB — IRON,TIBC AND FERRITIN PANEL
%SAT: 23 % (ref 15–60)
Ferritin: 50 ng/mL (ref 20–380)
Iron: 85 ug/dL (ref 50–180)
TIBC: 371 ug/dL (ref 250–425)

## 2016-09-17 LAB — INSULIN, FASTING: Insulin fasting, serum: 30.5 u[IU]/mL — ABNORMAL HIGH (ref 2.0–19.6)

## 2016-09-22 ENCOUNTER — Other Ambulatory Visit: Payer: Self-pay | Admitting: Family Medicine

## 2016-09-24 ENCOUNTER — Other Ambulatory Visit: Payer: Self-pay

## 2016-09-24 MED ORDER — EXENATIDE ER 2 MG ~~LOC~~ PEN: 2.0000 mg | PEN_INJECTOR | SUBCUTANEOUS | 3 refills | Status: DC

## 2016-09-24 NOTE — Telephone Encounter (Signed)
Patient requesting refill of Synthroid to [facility].

## 2016-11-22 ENCOUNTER — Telehealth: Payer: Self-pay

## 2016-11-22 DIAGNOSIS — E785 Hyperlipidemia, unspecified: Secondary | ICD-10-CM

## 2016-11-22 NOTE — Telephone Encounter (Signed)
Patient wife states he was prescribed Ozempic for weight loss and loss 10 pounds. But since Insurance would not cover this medication and Dr. Ancil Boozer changed to [facility]. He has gained 3 pounds back and would like to know since his Insurance covers Trulicity could you switch to this medication.

## 2016-11-23 NOTE — Telephone Encounter (Signed)
Please let pt know that I want to have him come in for a fasting lipid panel prior to ordering Trulicity to ensure he does not have hypertriglyceridemia - this combination has the potential to cause pancreatitis, and I want to avoid this.  Thank you!

## 2016-11-23 NOTE — Telephone Encounter (Signed)
Patient notified and will be here Monday to have labs done.

## 2016-11-27 LAB — LIPID PANEL
CHOL/HDL RATIO: 3.7 ratio (ref <5.0)
Cholesterol: 123 mg/dL (ref <200)
HDL: 33 mg/dL — AB (ref >40)
LDL CALC: 73 mg/dL (ref <100)
TRIGLYCERIDES: 85 mg/dL (ref <150)
VLDL: 17 mg/dL (ref <30)

## 2016-11-28 ENCOUNTER — Telehealth: Payer: Self-pay | Admitting: Family Medicine

## 2016-11-28 DIAGNOSIS — E669 Obesity, unspecified: Secondary | ICD-10-CM

## 2016-11-28 DIAGNOSIS — E8881 Metabolic syndrome: Secondary | ICD-10-CM

## 2016-11-28 MED ORDER — DULAGLUTIDE 0.75 MG/0.5ML ~~LOC~~ SOAJ: 0.7500 mg | SUBCUTANEOUS | 0 refills | Status: DC

## 2016-11-28 NOTE — Telephone Encounter (Signed)
Hello Mr. Elenes, I am writing to let you know that I have reviewed your lab results: Your cholesterol panel looks excellent including your triglycerides. Your HDL ("Good" cholesterol) is a little low, and this can be helped by eating tree nuts a few times a week, eating fish 2-3 times a week, and increasing your exercise. I will place the order for Trulicity for you to pick up at your pharmacy. Thank you!  Please feel free to message me back or call the clinic with any questions you may have. Thank you so much, Raelyn Ensign NP-C

## 2016-11-28 NOTE — Telephone Encounter (Signed)
Please call patient and let him know I have ordered Trulicity to [facility]. I am starting him at the lower dose of 0.75.   He has an appointment on June 29th, so I will order enough to get him to this appointment, and then Dr. Ancil Boozer can increase the dose if he is tolerating it.  Thank you!

## 2016-11-29 NOTE — Telephone Encounter (Signed)
Patient notified

## 2016-12-14 ENCOUNTER — Other Ambulatory Visit: Payer: Self-pay | Admitting: Family Medicine

## 2016-12-14 ENCOUNTER — Encounter: Payer: Self-pay | Admitting: Family Medicine

## 2016-12-14 ENCOUNTER — Ambulatory Visit (INDEPENDENT_AMBULATORY_CARE_PROVIDER_SITE_OTHER): Payer: Managed Care, Other (non HMO) | Admitting: Family Medicine

## 2016-12-14 VITALS — BP 126/68 | HR 71 | Temp 98.1°F | Resp 18 | Ht 70.0 in | Wt 250.0 lb

## 2016-12-14 DIAGNOSIS — I10 Essential (primary) hypertension: Secondary | ICD-10-CM

## 2016-12-14 DIAGNOSIS — E8881 Metabolic syndrome: Secondary | ICD-10-CM | POA: Diagnosis not present

## 2016-12-14 DIAGNOSIS — E785 Hyperlipidemia, unspecified: Secondary | ICD-10-CM | POA: Diagnosis not present

## 2016-12-14 DIAGNOSIS — E669 Obesity, unspecified: Secondary | ICD-10-CM

## 2016-12-14 DIAGNOSIS — E039 Hypothyroidism, unspecified: Secondary | ICD-10-CM

## 2016-12-14 LAB — TSH: TSH: 0.22 mIU/L — ABNORMAL LOW (ref 0.40–4.50)

## 2016-12-14 MED ORDER — DULAGLUTIDE 1.5 MG/0.5ML ~~LOC~~ SOAJ: 1.5000 mg | SUBCUTANEOUS | 2 refills | Status: DC

## 2016-12-14 MED ORDER — ROSUVASTATIN CALCIUM 20 MG PO TABS: 20.0000 mg | ORAL_TABLET | Freq: Every day | ORAL | 1 refills | Status: DC

## 2016-12-14 NOTE — Progress Notes (Signed)
Name: Alan Campbell   [name]: [id number]    DOB: June 08, 1951   Date:12/14/2016       Progress Note  Subjective  Chief Complaint  Chief Complaint  Patient presents with  . Hypertension    3 month follow up  . Hyperlipidemia  . Obesity    has been on the Trulicity for 2 weeks stated that the ozempic worked well but insurance would not cover it  . Hypothyroidism    HPI  HTN: taking medication, no side effects, no chest pain, no palpitation. BP is at goal, denies dizziness  Hypothyroidism: still has dry skin, no constipation, taking medication as prescribed, last TSH was at goal, we will recheck TSH  Dyslipidemia: taking Crestor daily, no myalgias chest pain or palpitation   Osteoporosis: took Forteo for 2 years, not on any medication, advised to take Vitamin D otc, high dairy intake per day.   Metabolic Syndrome: he has been taking Metformin for years, but has diarrhea and is down to one pill daily, fasting insulin was high in March, he is now on Trulicity 5.17 mg but is not as efficacious as Ozempic, but he will try higher dose of Trulicity first. He denies nausea or vomiting.   Obesity: problems started around his 51's. He avoid sweet beverages, decreased beer intake, avoid carbohydrates but he was getting frustrated because he gained almost 26 lbs in the past year. He has been responding to GLP-1 and is doing well. Lost 5 lbs since last visit.     Patient Active Problem List   Diagnosis Date Noted  . Epididymal cyst 06/24/2016  . Anemia, iron deficiency 01/16/2016  . Benign essential HTN 12/30/2014  . Dyslipidemia 12/30/2014  . Adult hypothyroidism 12/30/2014  . Dysmetabolic syndrome 00/17/4944  . Headache, migraine 12/30/2014  . Obesity (BMI 30-39.9) 12/30/2014  . Arthritis, degenerative 12/30/2014  . Perennial allergic rhinitis with seasonal variation 12/30/2014  . 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  . Patella-femoral syndrome 12/03/2014  . Low back pain 04/03/2010  .  Vitamin D deficiency 04/14/2009  . Abnormal presence of protein in urine 09/12/2007  . OP (osteoporosis) 05/05/2007    Past Surgical History:  Procedure Laterality Date  . LAMINECTOMY  1985   L-5    Family History  Problem Relation Age of Onset  . Heart disease Father   . Hypothyroidism Sister   . Hypothyroidism Sister   . Migraines Sister   . Prostate cancer Neg Hx   . Bladder Cancer Neg Hx   . Kidney cancer Neg Hx     Social History   Social History  . Marital status: Married    Spouse name: N/A  . Number of children: N/A  . Years of education: N/A   Occupational History  . Not on file.   Social History Main Topics  . Smoking status: Never Smoker  . Smokeless tobacco: Never Used  . Alcohol use 2.4 oz/week    4 Standard drinks or equivalent per week  . Drug use: No  . Sexual activity: Yes    Partners: Female   Other Topics Concern  . Not on file   Social History Narrative  . No narrative on file     Current Outpatient Prescriptions:  .  amLODipine-valsartan (EXFORGE) 10-320 MG tablet, Take 1 tablet by mouth daily., Disp: 90 tablet, Rfl: 1 .  [name] PUMP 20.25 MG/ACT (1.62%) GEL, APPLY 4 PUMPS TOPICALLY TO SKIN DAILY ASDIRECTED., Disp: 150 g, Rfl: 2 .  aspirin 81 MG chewable tablet,  Chew 1 tablet by mouth daily., Disp: , Rfl:  .  Cholecalciferol (VITAMIN D) 2000 units CAPS, Take 1 capsule (2,000 Units total) by mouth daily., Disp: 30 capsule, Rfl: 0 .  Dulaglutide (TRULICITY) 1.5 YJ/8.5UD SOPN, Inject 1.5 mg into the skin once a week., Disp: 4 pen, Rfl: 2 .  Multiple Vitamins-Minerals (CENTRUM SILVER 50+MEN) TABS, Take 1 tablet by mouth daily., Disp: , Rfl:  .  OMEGA-3 FATTY ACIDS PO, Take 1 tablet by mouth daily., Disp: , Rfl:  .  rosuvastatin (CRESTOR) 20 MG tablet, Take 1 tablet (20 mg total) by mouth daily., Disp: 90 tablet, Rfl: 1 .  SYNTHROID 200 MCG tablet, TAKE 1 1/2 TABLET BY MOUTH ON SUNDAY THEN TAKE 1 TABLET ONCE DAILY ONOTHER DAYS, Disp: 32  tablet, Rfl: 2 .  triamterene-hydrochlorothiazide (MAXZIDE-25) 37.5-25 MG tablet, Take 1 tablet by mouth daily., Disp: 90 tablet, Rfl: 1 .  Verapamil HCl CR 300 MG CP24, Take 1 capsule (300 mg total) by mouth daily., Disp: 90 each, Rfl: 1  No Known Allergies   ROS  Constitutional: Negative for fever, positive for  weight change.  Respiratory: Negative for cough and shortness of breath.   Cardiovascular: Negative for chest pain or palpitations.  Gastrointestinal: Negative for abdominal pain, no bowel changes.  Musculoskeletal: Negative for gait problem or joint swelling.  Skin: Negative for rash.  Neurological: Negative for dizziness or headache.  No other specific complaints in a complete review of systems (except as listed in HPI above).  Objective  Vitals:   12/14/16 0853  BP: 126/68  Pulse: 71  Resp: 18  Temp: 98.1 F (36.7 C)  SpO2: 98%  Weight: 250 lb (113.4 kg)  Height: 5\' 10"  (1.778 m)    Body mass index is 35.87 kg/m.  Physical Exam  Constitutional: Patient appears well-developed and well-nourished. Obese  No distress.  HEENT: head atraumatic, normocephalic, pupils equal and reactive to light,  neck supple, throat within normal limits Cardiovascular: Normal rate, regular rhythm and normal heart sounds.  No murmur heard. No BLE edema. Pulmonary/Chest: Effort normal and breath sounds normal. No respiratory distress. Abdominal: Soft.  There is no tenderness. Psychiatric: Patient has a normal mood and affect. behavior is normal. Judgment and thought content normal.  Recent Results (from the past 2160 hour(s))  Lipid panel     Status: Abnormal   Collection Time: 11/27/16  8:32 AM  Result Value Ref Range   Cholesterol 123 <200 mg/dL   Triglycerides 85 <150 mg/dL   HDL 33 (L) >40 mg/dL   Total CHOL/HDL Ratio 3.7 <5.0 Ratio   VLDL 17 <30 mg/dL   LDL Cholesterol 73 <100 mg/dL      PHQ2/9: Depression screen [facility] 2/9 09/07/2016 05/30/2016 02/27/2016 01/16/2016  11/04/2015  Decreased Interest 0 0 0 0 0  Down, Depressed, Hopeless 0 0 0 0 0  PHQ - 2 Score 0 0 0 0 0     Fall Risk: Fall Risk  09/07/2016 05/30/2016 02/27/2016 01/16/2016 11/04/2015  Falls in the past year? No No No No No    Assessment & Plan  1. Obesity (BMI 30-39.9)  He did well on Ozempic, lost 12 lbs, but gained some back when he had to switch to Bydureon and is now on Trulicity, and we will increase to 1.5, the needle bothers him a little and he prefers Ozempic but he will give a trial on higher dose of Trulicity  Abdominal girth 46.5 inch  2. Dysmetabolic syndrome  - Dulaglutide (TRULICITY)  1.5 MG/0.5ML SOPN; Inject 1.5 mg into the skin once a week.  Dispense: 4 pen; Refill: 2  3. Dyslipidemia  - rosuvastatin (CRESTOR) 20 MG tablet; Take 1 tablet (20 mg total) by mouth daily.  Dispense: 90 tablet; Refill: 1  4. Benign essential HTN  Well controlled  5. Hypothyroidism, adult  - TSH

## 2017-01-26 ENCOUNTER — Other Ambulatory Visit: Payer: Self-pay | Admitting: Family Medicine

## 2017-01-28 NOTE — Telephone Encounter (Signed)
Patient requesting refill of Synthroid to [facility].

## 2017-01-29 ENCOUNTER — Other Ambulatory Visit: Payer: Self-pay

## 2017-01-29 DIAGNOSIS — E039 Hypothyroidism, unspecified: Secondary | ICD-10-CM

## 2017-01-29 LAB — TSH: TSH: 0.16 m[IU]/L — AB (ref 0.40–4.50)

## 2017-01-30 ENCOUNTER — Other Ambulatory Visit: Payer: Self-pay | Admitting: Family Medicine

## 2017-01-30 MED ORDER — SYNTHROID 200 MCG PO TABS: 200.0000 ug | ORAL_TABLET | Freq: Every day | ORAL | 1 refills | Status: DC

## 2017-03-18 ENCOUNTER — Ambulatory Visit (INDEPENDENT_AMBULATORY_CARE_PROVIDER_SITE_OTHER): Payer: Managed Care, Other (non HMO) | Admitting: Family Medicine

## 2017-03-18 ENCOUNTER — Encounter: Payer: Self-pay | Admitting: Family Medicine

## 2017-03-18 VITALS — BP 130/70 | HR 67 | Resp 12 | Wt 255.2 lb

## 2017-03-18 DIAGNOSIS — E669 Obesity, unspecified: Secondary | ICD-10-CM

## 2017-03-18 DIAGNOSIS — E785 Hyperlipidemia, unspecified: Secondary | ICD-10-CM | POA: Diagnosis not present

## 2017-03-18 DIAGNOSIS — I10 Essential (primary) hypertension: Secondary | ICD-10-CM

## 2017-03-18 DIAGNOSIS — E039 Hypothyroidism, unspecified: Secondary | ICD-10-CM

## 2017-03-18 DIAGNOSIS — Z23 Encounter for immunization: Secondary | ICD-10-CM | POA: Diagnosis not present

## 2017-03-18 DIAGNOSIS — E8881 Metabolic syndrome: Secondary | ICD-10-CM | POA: Diagnosis not present

## 2017-03-18 DIAGNOSIS — Z862 Personal history of diseases of the blood and blood-forming organs and certain disorders involving the immune mechanism: Secondary | ICD-10-CM | POA: Diagnosis not present

## 2017-03-18 DIAGNOSIS — E559 Vitamin D deficiency, unspecified: Secondary | ICD-10-CM | POA: Diagnosis not present

## 2017-03-18 DIAGNOSIS — E291 Testicular hypofunction: Secondary | ICD-10-CM | POA: Diagnosis not present

## 2017-03-18 MED ORDER — AMLODIPINE BESYLATE-VALSARTAN 10-320 MG PO TABS: 1.0000 | ORAL_TABLET | Freq: Every day | ORAL | 1 refills | Status: DC

## 2017-03-18 MED ORDER — TRIAMTERENE-HCTZ 37.5-25 MG PO TABS: 1.0000 | ORAL_TABLET | Freq: Every day | ORAL | 1 refills | Status: DC

## 2017-03-18 MED ORDER — SEMAGLUTIDE(0.25 OR 0.5MG/DOS) 2 MG/1.5ML ~~LOC~~ SOPN: 0.5000 mg | PEN_INJECTOR | SUBCUTANEOUS | 2 refills | Status: DC

## 2017-03-18 MED ORDER — VERAPAMIL HCL ER 300 MG PO CP24: 1.0000 | ORAL_CAPSULE | Freq: Every day | ORAL | 1 refills | Status: DC

## 2017-03-18 NOTE — Progress Notes (Signed)
Name: Alan Campbell   [name]: [id number]    DOB: 1950/10/18   Date:03/18/2017       Progress Note  Subjective  Chief Complaint  No chief complaint on file.   HPI  HTN: taking medication, no side effects, no chest pain, no palpitation. BP is at goal, denies dizziness, bp is at goal today  Hypothyroidism: still has dry skin, no constipation, taking medication as prescribed, last TSH was suppressed last visit,  we will recheck TSH  Dyslipidemia: taking Crestor daily, no myalgias chest pain or palpitation Recheck labs today  Osteoporosis: took Forteo for 2 years, not on any medication, advised to take Vitamin D otc, high dairy intake per day.   Metabolic Syndrome: he has been taking Metformin for years, but has diarrhea and is down to one pill daily, fasting insulin was high in March, he is now on Trulicity 1.5  mg but is not as efficacious as Ozempic, he would like to see if we can try PA for Ozempic since it worked best for him.   Obesity: problems started around his 62's. He avoid sweet beverages, decreased beer intake, avoid carbohydrates but he was getting frustrated because he gained almost 26 lbs in 2017 He has been responding to GLP-1 he had lost weight on Ozempic but weight is going up again. He denies nausea , no previous history of pancreatitis, no family history of thyroid cancer   Patient Active Problem List   Diagnosis Date Noted  . Epididymal cyst 06/24/2016  . Anemia, iron deficiency 01/16/2016  . Benign essential HTN 12/30/2014  . Dyslipidemia 12/30/2014  . Adult hypothyroidism 12/30/2014  . Dysmetabolic syndrome 50/02/3817  . Headache, migraine 12/30/2014  . Obesity (BMI 30-39.9) 12/30/2014  . Arthritis, degenerative 12/30/2014  . Perennial allergic rhinitis with seasonal variation 12/30/2014  . 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  . Patella-femoral syndrome 12/03/2014  . Low back pain 04/03/2010  . Vitamin D deficiency 04/14/2009  . Abnormal presence of protein  in urine 09/12/2007  . OP (osteoporosis) 05/05/2007    Past Surgical History:  Procedure Laterality Date  . LAMINECTOMY  1985   L-5    Family History  Problem Relation Age of Onset  . Heart disease Father   . Hypothyroidism Sister   . Hypothyroidism Sister   . Migraines Sister   . Prostate cancer Neg Hx   . Bladder Cancer Neg Hx   . Kidney cancer Neg Hx     Social History   Social History  . Marital status: Married    Spouse name: N/A  . Number of children: N/A  . Years of education: N/A   Occupational History  . Not on file.   Social History Main Topics  . Smoking status: Never Smoker  . Smokeless tobacco: Never Used  . Alcohol use 2.4 oz/week    4 Standard drinks or equivalent per week  . Drug use: No  . Sexual activity: Yes    Partners: Female   Other Topics Concern  . Not on file   Social History Narrative  . No narrative on file     Current Outpatient Prescriptions:  .  amLODipine-valsartan (EXFORGE) 10-320 MG tablet, Take 1 tablet by mouth daily., Disp: 90 tablet, Rfl: 1 .  [name] PUMP 20.25 MG/ACT (1.62%) GEL, APPLY 4 PUMPS TOPICALLY TO SKIN DAILY ASDIRECTED., Disp: 150 g, Rfl: 2 .  aspirin 81 MG chewable tablet, Chew 1 tablet by mouth daily., Disp: , Rfl:  .  Cholecalciferol (VITAMIN D) 2000 units  CAPS, Take 1 capsule (2,000 Units total) by mouth daily., Disp: 30 capsule, Rfl: 0 .  Multiple Vitamins-Minerals (CENTRUM SILVER 50+MEN) TABS, Take 1 tablet by mouth daily., Disp: , Rfl:  .  OMEGA-3 FATTY ACIDS PO, Take 1 tablet by mouth daily., Disp: , Rfl:  .  rosuvastatin (CRESTOR) 20 MG tablet, Take 1 tablet (20 mg total) by mouth daily., Disp: 90 tablet, Rfl: 1 .  SYNTHROID 200 MCG tablet, Take 1 tablet (200 mcg total) by mouth daily before breakfast., Disp: 30 tablet, Rfl: 1 .  Semaglutide (OZEMPIC) 0.25 or 0.5 MG/DOSE SOPN, Inject 0.5 mg into the skin once a week., Disp: 2 pen, Rfl: 2 .  triamterene-hydrochlorothiazide (MAXZIDE-25) 37.5-25 MG  tablet, Take 1 tablet by mouth daily., Disp: 90 tablet, Rfl: 1 .  Verapamil HCl CR 300 MG CP24, Take 1 capsule (300 mg total) by mouth daily., Disp: 90 each, Rfl: 1  No Known Allergies   ROS  Constitutional: Patient appears well-developed and well-nourished. Obese  No distress.  HEENT: head atraumatic, normocephalic, pupils equal and reactive to light, neck supple, throat within normal limits Cardiovascular: Normal rate, regular rhythm and normal heart sounds.  No murmur heard. No BLE edema. Pulmonary/Chest: Effort normal and breath sounds normal. No respiratory distress. Abdominal: Soft.  There is no tenderness. Psychiatric: Patient has a normal mood and affect. behavior is normal. Judgment and thought content normal.  Objective  Vitals:   03/18/17 0935  BP: 130/70  Pulse: 67  Resp: 12  SpO2: 98%  Weight: 255 lb 3.2 oz (115.8 kg)    Body mass index is 36.62 kg/m.  Physical Exam  Constitutional: Patient appears well-developed and well-nourished. Obese  No distress.  HEENT: head atraumatic, normocephalic, pupils equal and reactive to light,  neck supple, throat within normal limits Cardiovascular: Normal rate, regular rhythm and normal heart sounds.  No murmur heard. 1 plus  BLE edema. Pulmonary/Chest: Effort normal and breath sounds normal. No respiratory distress. Abdominal: Soft.  There is no tenderness. Psychiatric: Patient has a normal mood and affect. behavior is normal. Judgment and thought content normal.  Recent Results (from the past 2160 hour(s))  TSH     Status: Abnormal   Collection Time: 01/29/17  9:00 AM  Result Value Ref Range   TSH 0.16 (L) 0.40 - 4.50 mIU/L     PHQ2/9: Depression screen [facility] 2/9 09/07/2016 05/30/2016 02/27/2016 01/16/2016 11/04/2015  Decreased Interest 0 0 0 0 0  Down, Depressed, Hopeless 0 0 0 0 0  PHQ - 2 Score 0 0 0 0 0     Fall Risk: Fall Risk  09/07/2016 05/30/2016 02/27/2016 01/16/2016 11/04/2015  Falls in the past year? No No No  No No      Assessment & Plan  1. Benign essential HTN  - triamterene-hydrochlorothiazide (MAXZIDE-25) 37.5-25 MG tablet; Take 1 tablet by mouth daily.  Dispense: 90 tablet; Refill: 1 - Verapamil HCl CR 300 MG CP24; Take 1 capsule (300 mg total) by mouth daily.  Dispense: 90 each; Refill: 1 - COMPLETE METABOLIC PANEL WITH GFR - amLODipine-valsartan (EXFORGE) 10-320 MG tablet; Take 1 tablet by mouth daily.  Dispense: 90 tablet; Refill: 1  2. Obesity (BMI 30-39.9)  Trulicity is not curbing his appetite, he has been gaining weight again, he really liked Ozempic and would like to go back on it - Hemoglobin A1c - Insulin, fasting - Semaglutide (OZEMPIC) 0.25 or 0.5 MG/DOSE SOPN; Inject 0.5 mg into the skin once a week.  Dispense: 2 pen;  Refill: 2  3. Dysmetabolic syndrome  - Hemoglobin A1c - Semaglutide (OZEMPIC) 0.25 or 0.5 MG/DOSE SOPN; Inject 0.5 mg into the skin once a week.  Dispense: 2 pen; Refill: 2  4. Dyslipidemia  - Lipid panel  5. Hypothyroidism, adult  Last level was suppressed, we will recheck TSH today  Taking one pill daily 200 mcg   6. History of iron deficiency anemia  - CBC with Differential/Platelet  7. Testicular hypofunction   he would like to hold off on getting labs, not using pump lately  8. Vitamin D deficiency  - VITAMIN D 25 Hydroxy (Vit-D Deficiency, Fractures)  9. Needs flu shot  - Flu Vaccine QUAD 36+ mos IM

## 2017-03-19 LAB — COMPLETE METABOLIC PANEL WITH GFR
AG RATIO: 2 (calc) (ref 1.0–2.5)
ALT: 50 U/L — ABNORMAL HIGH (ref 9–46)
AST: 30 U/L (ref 10–35)
Albumin: 4.3 g/dL (ref 3.6–5.1)
Alkaline phosphatase (APISO): 75 U/L (ref 40–115)
BUN: 17 mg/dL (ref 7–25)
CALCIUM: 9 mg/dL (ref 8.6–10.3)
CO2: 29 mmol/L (ref 20–32)
CREATININE: 0.95 mg/dL (ref 0.70–1.25)
Chloride: 108 mmol/L (ref 98–110)
GFR, EST AFRICAN AMERICAN: 96 mL/min/{1.73_m2} (ref >=60)
GFR, EST NON AFRICAN AMERICAN: 83 mL/min/{1.73_m2} (ref >=60)
GLOBULIN: 2.1 g/dL (ref 1.9–3.7)
Glucose, Bld: 99 mg/dL (ref 65–99)
POTASSIUM: 4.1 mmol/L (ref 3.5–5.3)
SODIUM: 143 mmol/L (ref 135–146)
TOTAL PROTEIN: 6.4 g/dL (ref 6.1–8.1)
Total Bilirubin: 0.6 mg/dL (ref 0.2–1.2)

## 2017-03-19 LAB — HEMOGLOBIN A1C
EAG (MMOL/L): 4.7 (calc)
HEMOGLOBIN A1C: 4.6 %{Hb} (ref <5.7)
Mean Plasma Glucose: 85 (calc)

## 2017-03-19 LAB — LIPID PANEL
CHOLESTEROL: 157 mg/dL (ref <200)
HDL: 51 mg/dL (ref >40)
LDL Cholesterol (Calc): 84 mg/dL (calc)
Non-HDL Cholesterol (Calc): 106 mg/dL (calc) (ref <130)
Total CHOL/HDL Ratio: 3.1 (calc) (ref <5.0)
Triglycerides: 120 mg/dL (ref <150)

## 2017-03-19 LAB — VITAMIN D 25 HYDROXY (VIT D DEFICIENCY, FRACTURES): VIT D 25 HYDROXY: 41 ng/mL (ref 30–100)

## 2017-03-19 LAB — CBC WITH DIFFERENTIAL/PLATELET
BASOS ABS: 50 {cells}/uL (ref 0–200)
BASOS PCT: 0.8 %
EOS ABS: 130 {cells}/uL (ref 15–500)
Eosinophils Relative: 2.1 %
HCT: 38.4 % — ABNORMAL LOW (ref 38.5–50.0)
HEMOGLOBIN: 13.2 g/dL (ref 13.2–17.1)
Lymphs Abs: 1370 cells/uL (ref 850–3900)
MCH: 30.6 pg (ref 27.0–33.0)
MCHC: 34.4 g/dL (ref 32.0–36.0)
MCV: 89.1 fL (ref 80.0–100.0)
MONOS PCT: 5.6 %
MPV: 9.3 fL (ref 7.5–12.5)
NEUTROS ABS: 4303 {cells}/uL (ref 1500–7800)
Neutrophils Relative %: 69.4 %
Platelets: 233 10*3/uL (ref 140–400)
RBC: 4.31 10*6/uL (ref 4.20–5.80)
RDW: 13.3 % (ref 11.0–15.0)
TOTAL LYMPHOCYTE: 22.1 %
WBC: 6.2 10*3/uL (ref 3.8–10.8)
WBCMIX: 347 {cells}/uL (ref 200–950)

## 2017-03-19 LAB — EXTRA LAV TOP TUBE

## 2017-03-19 LAB — INSULIN, RANDOM: INSULIN: 16.2 u[IU]/mL (ref 2.0–19.6)

## 2017-03-19 LAB — TSH: TSH: 2.54 mIU/L (ref 0.40–4.50)

## 2017-04-08 ENCOUNTER — Other Ambulatory Visit: Payer: Self-pay | Admitting: Family Medicine

## 2017-04-08 NOTE — Telephone Encounter (Signed)
Refill request for thyroid medication to [facility].   Last Visit: 03/18/2017  Lab Results  Component Value Date   TSH 2.54 03/18/2017    Next visit: 06/19/17.

## 2017-05-30 ENCOUNTER — Other Ambulatory Visit: Payer: Self-pay | Admitting: Family Medicine

## 2017-06-01 ENCOUNTER — Other Ambulatory Visit: Payer: Self-pay | Admitting: Family Medicine

## 2017-06-03 ENCOUNTER — Ambulatory Visit (INDEPENDENT_AMBULATORY_CARE_PROVIDER_SITE_OTHER): Payer: Managed Care, Other (non HMO) | Admitting: Family Medicine

## 2017-06-03 ENCOUNTER — Encounter: Payer: Self-pay | Admitting: Family Medicine

## 2017-06-03 VITALS — BP 130/80 | HR 76 | Temp 98.6°F | Resp 14 | Ht 70.47 in | Wt 253.0 lb

## 2017-06-03 DIAGNOSIS — I10 Essential (primary) hypertension: Secondary | ICD-10-CM | POA: Diagnosis not present

## 2017-06-03 DIAGNOSIS — E039 Hypothyroidism, unspecified: Secondary | ICD-10-CM | POA: Diagnosis not present

## 2017-06-03 DIAGNOSIS — I4510 Unspecified right bundle-branch block: Secondary | ICD-10-CM

## 2017-06-03 DIAGNOSIS — E785 Hyperlipidemia, unspecified: Secondary | ICD-10-CM

## 2017-06-03 DIAGNOSIS — Z79899 Other long term (current) drug therapy: Secondary | ICD-10-CM

## 2017-06-03 DIAGNOSIS — Z0000 Encounter for general adult medical examination without abnormal findings: Secondary | ICD-10-CM

## 2017-06-03 DIAGNOSIS — E8881 Metabolic syndrome: Secondary | ICD-10-CM | POA: Diagnosis not present

## 2017-06-03 DIAGNOSIS — Z125 Encounter for screening for malignant neoplasm of prostate: Secondary | ICD-10-CM

## 2017-06-03 DIAGNOSIS — E291 Testicular hypofunction: Secondary | ICD-10-CM

## 2017-06-03 MED ORDER — TESTOSTERONE 20.25 MG/ACT (1.62%) TD GEL: 4.0000 | Freq: Every day | TRANSDERMAL | 1 refills | Status: DC

## 2017-06-03 MED ORDER — ROSUVASTATIN CALCIUM 20 MG PO TABS: 20.0000 mg | ORAL_TABLET | Freq: Every day | ORAL | 1 refills | Status: DC

## 2017-06-03 MED ORDER — SEMAGLUTIDE (1 MG/DOSE) 2 MG/1.5ML ~~LOC~~ SOPN: 1.0000 mg | PEN_INJECTOR | SUBCUTANEOUS | 2 refills | Status: DC

## 2017-06-03 NOTE — Progress Notes (Signed)
Name: Alan Campbell   [name]: [id number]    DOB: October 12, 1950   Date:06/03/2017       Progress Note  Subjective  Chief Complaint  Chief Complaint  Patient presents with  . Annual Exam    HPI  Patient presents for annual CPE and follow up  Metabolic syndrome: he was on Metformin for many years, but still had insulin resistance and weight was trending up, he has been on GLP agonists this past Spring. His weight has not changed much, but he prefers than taking Metformin because of side effect bloating and loose stools  HTN: taking medication daily and denies side effects, due for EKG today. No chest pain or palpitatoin  Dyslipidemia: reviewed labs . HDL improved and LDL is at goal   Hypogonadism history of osteoporosis: he takes testosterone supplementation, he denies side effects. He states it seems to help with libido and energy level  Hypothyroidism: taking synthroid once daily and last TSH was normal we will recheck level and monitor   USPSTF grade A and B recommendations:  Diet: not enough calcium intake Exercise: he will increase, not enough at this time  IPSS Questionnaire (AUA-7): Over the past month.   1)  How often have you had a sensation of not emptying your bladder completely after you finish urinating?  0 - Not at all  2)  How often have you had to urinate again less than two hours after you finished urinating? 1 - Less than 1 time in 5  3)  How often have you found you stopped and started again several times when you urinated?  0 - Not at all  4) How difficult have you found it to postpone urination?  0 - Not at all  5) How often have you had a weak urinary stream?  0 - Not at all  6) How often have you had to push or strain to begin urination?  0 - Not at all  7) How many times did you most typically get up to urinate from the time you went to bed until the time you got up in the morning?  1 - 1 time  Total score:  0-7 mildly symptomatic   8-19 moderately symptomatic    20-35 severely symptomatic   Depression:  Depression screen [facility] 2/9 06/03/2017 09/07/2016 05/30/2016 02/27/2016 01/16/2016  Decreased Interest 0 0 0 0 0  Down, Depressed, Hopeless 0 0 0 0 0  PHQ - 2 Score 0 0 0 0 0    Hypertension: BP Readings from Last 3 Encounters:  06/03/17 130/80  03/18/17 130/70  12/14/16 126/68    Obesity: Wt Readings from Last 3 Encounters:  06/03/17 253 lb (114.8 kg)  03/18/17 255 lb 3.2 oz (115.8 kg)  12/14/16 250 lb (113.4 kg)   BMI Readings from Last 3 Encounters:  06/03/17 35.82 kg/m  03/18/17 36.62 kg/m  12/14/16 35.87 kg/m     Lipids:  Lab Results  Component Value Date   CHOL 157 03/18/2017   CHOL 123 11/27/2016   CHOL 141 11/29/2015   Lab Results  Component Value Date   HDL 51 03/18/2017   HDL 33 (L) 11/27/2016   HDL 46 11/29/2015   Lab Results  Component Value Date   LDLCALC 73 11/27/2016   LDLCALC 79 11/29/2015   LDLCALC 96 06/16/2015   Lab Results  Component Value Date   TRIG 120 03/18/2017   TRIG 85 11/27/2016   TRIG 80 11/29/2015   Lab Results  Component Value Date   CHOLHDL 3.1 03/18/2017   CHOLHDL 3.7 11/27/2016   CHOLHDL 3.1 11/29/2015   No results found for: LDLDIRECT Glucose:  Glucose  Date Value Ref Range Status  11/29/2015 109 (H) 65 - 99 mg/dL Final  06/16/2015 119 (H) 65 - 99 mg/dL Final   Glucose, Bld  Date Value Ref Range Status  03/18/2017 99 65 - 99 mg/dL Final    Comment:    .            Fasting reference interval .   09/14/2016 126 (H) 65 - 99 mg/dL Final    Alcohol:  Tobacco use: quit at age 70  Married STD testing and prevention (chl/gon/syphilis): not interested HIV, hep C: up to date  Skin cancer: no changes - still seeing Dr. Nehemiah Massed  Colorectal cancer: up to date Prostate cancer:  Lab Results  Component Value Date   PSA 0.7 05/03/2014   Lung cancer:  Low Dose CT Chest recommended if Age 12-80 years, 30 pack-year currently smoking OR have quit w/in 15years. Patient  does not qualify.   AAA:  The USPSTF recommends one-time screening with ultrasonography in men ages 64 to 16 years who have ever smoked He will wait until he enrolls into Medicare  Aspirin: daily  ECG:  Today   Advanced Care Planning:  Patient does have a living will at present time. If patient does have living will, I have requested they bring this to the clinic to be scanned in to their chart.  Patient Active Problem List   Diagnosis Date Noted  . Epididymal cyst 06/24/2016  . Anemia, iron deficiency 01/16/2016  . Benign essential HTN 12/30/2014  . Dyslipidemia 12/30/2014  . Adult hypothyroidism 12/30/2014  . Dysmetabolic syndrome 04/03/5101  . Headache, migraine 12/30/2014  . Obesity (BMI 30-39.9) 12/30/2014  . Arthritis, degenerative 12/30/2014  . Perennial allergic rhinitis with seasonal variation 12/30/2014  . 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  . Patella-femoral syndrome 12/03/2014  . Low back pain 04/03/2010  . Vitamin D deficiency 04/14/2009  . Abnormal presence of protein in urine 09/12/2007  . OP (osteoporosis) 05/05/2007    Past Surgical History:  Procedure Laterality Date  . LAMINECTOMY  1985   L-5    Family History  Problem Relation Age of Onset  . Heart disease Father   . Hypothyroidism Sister   . Thyroid disease Sister   . Hypothyroidism Sister   . Migraines Sister   . Prostate cancer Neg Hx   . Bladder Cancer Neg Hx   . Kidney cancer Neg Hx     Social History   Socioeconomic History  . Marital status: Married    Spouse name: donna  . Number of children: 0  . Years of education: Not on file  . Highest education level: Not on file  Social Needs  . Financial resource strain: Not hard at all  . Food insecurity - worry: Never true  . Food insecurity - inability: Never true  . Transportation needs - medical: No  . Transportation needs - non-medical: No  Occupational History  . Occupation: managing/training   Tobacco Use  . Smoking status:  Former Smoker    Packs/day: 0.50    Years: 5.00    Pack years: 2.50    Types: Cigarettes    Last attempt to quit: 10/03/1970    Years since quitting: 46.6  . Smokeless tobacco: Never Used  Substance and Sexual Activity  . Alcohol use: Yes    Alcohol/week: 2.4 oz  Types: 4 Standard drinks or equivalent per week  . Drug use: No  . Sexual activity: Yes    Partners: Female    Birth control/protection: Post-menopausal  Other Topics Concern  . Not on file  Social History Narrative   Married since 1974, no children but they helped raise Tretha Sciara ( wife's niece), also helped raise their God-daughter Lyndee Leo     Current Outpatient Medications:  .  amLODipine-valsartan (EXFORGE) 10-320 MG tablet, Take 1 tablet by mouth daily., Disp: 90 tablet, Rfl: 1 .  [name] PUMP 20.25 MG/ACT (1.62%) GEL, APPLY 4 PUMPS TOPICALLY TO SKIN ONCE DAILY AS DIRECTED, Disp: 150 g, Rfl: 1 .  aspirin 81 MG chewable tablet, Chew 1 tablet by mouth daily., Disp: , Rfl:  .  Cholecalciferol (VITAMIN D) 2000 units CAPS, Take 1 capsule (2,000 Units total) by mouth daily., Disp: 30 capsule, Rfl: 0 .  Multiple Vitamins-Minerals (CENTRUM SILVER 50+MEN) TABS, Take 1 tablet by mouth daily., Disp: , Rfl:  .  OMEGA-3 FATTY ACIDS PO, Take 1 tablet by mouth daily., Disp: , Rfl:  .  rosuvastatin (CRESTOR) 20 MG tablet, Take 1 tablet (20 mg total) by mouth daily., Disp: 90 tablet, Rfl: 1 .  Semaglutide (OZEMPIC) 0.25 or 0.5 MG/DOSE SOPN, Inject 0.5 mg into the skin once a week., Disp: 2 pen, Rfl: 2 .  SYNTHROID 200 MCG tablet, TAKE 1 TABLET BY MOUTH ONCE DAILY BEFOREBREAKFAST, Disp: 90 tablet, Rfl: 0 .  triamterene-hydrochlorothiazide (MAXZIDE-25) 37.5-25 MG tablet, Take 1 tablet by mouth daily., Disp: 90 tablet, Rfl: 1 .  Verapamil HCl CR 300 MG CP24, Take 1 capsule (300 mg total) by mouth daily., Disp: 90 each, Rfl: 1  No Known Allergies   ROS   Constitutional: Negative for fever or weight change.  Respiratory: Negative  for cough and shortness of breath.   Cardiovascular: Negative for chest pain or palpitations.  Gastrointestinal: Negative for abdominal pain, no bowel changes.  Musculoskeletal: Negative for gait problem or joint swelling.  Skin: Negative for rash.  Neurological: Negative for dizziness or headache.  No other specific complaints in a complete review of systems (except as listed in HPI above).   Objective  Vitals:   06/03/17 1029  BP: 130/80  Pulse: 76  Resp: 14  Temp: 98.6 F (37 C)  TempSrc: Oral  SpO2: 99%  Weight: 253 lb (114.8 kg)  Height: 5' 10.47" (1.79 m)    Body mass index is 35.82 kg/m.  Physical Exam   Constitutional: Patient appears well-developed and well-nourished. No distress.  HENT: Head: Normocephalic and atraumatic. Ears: B TMs ok, no erythema or effusion; Nose: Nose normal. Mouth/Throat: Oropharynx is clear and moist. No oropharyngeal exudate.  Eyes: Conjunctivae and EOM are normal. Pupils are equal, round, and reactive to light. No scleral icterus.  Neck: Normal range of motion. Neck supple. No JVD present. No thyromegaly present.  Cardiovascular: Normal rate, regular rhythm and normal heart sounds.  No murmur heard. No BLE edema. Pulmonary/Chest: Effort normal and breath sounds normal. No respiratory distress. Abdominal: Soft. Bowel sounds are normal, no distension. There is no tenderness. no masses MALE GENITALIA: Normal descended testes bilaterally, no masses palpated, no hernias, no lesions, no discharge RECTAL: Prostate normal size and consistency, no rectal masses or hemorrhoids Musculoskeletal: Normal range of motion, no joint effusions. No gross deformities Neurological: he is alert and oriented to person, place, and time. No cranial nerve deficit. Coordination, balance, strength, speech and gait are normal.  Skin: Skin is warm and dry.  No rash noted. No erythema.  Psychiatric: Patient has a normal mood and affect. behavior is normal. Judgment and  thought content normal.  Recent Results (from the past 2160 hour(s))  TSH     Status: None   Collection Time: 03/18/17 10:28 AM  Result Value Ref Range   TSH 2.54 0.40 - 4.50 mIU/L  EXTRA LAV TOP TUBE     Status: None   Collection Time: 03/18/17 10:28 AM  Result Value Ref Range   EXTRA LAVENDER-TOP TUBE      Comment: We received an extra specimen with no test requested. If any test is desired for this specimen please call client services and advise.   COMPLETE METABOLIC PANEL WITH GFR     Status: Abnormal   Collection Time: 03/18/17 12:32 PM  Result Value Ref Range   Glucose, Bld 99 65 - 99 mg/dL    Comment: .            Fasting reference interval .    BUN 17 7 - 25 mg/dL   Creat 0.95 0.70 - 1.25 mg/dL    Comment: For patients >110 years of age, the reference limit for Creatinine is approximately 13% higher for people identified as African-American. .    GFR, Est Non African American 83 > OR = 60 mL/min/1.61m2   GFR, Est African American 96 > OR = 60 mL/min/1.34m2   BUN/Creatinine Ratio NOT APPLICABLE 6 - 22 (calc)   Sodium 143 135 - 146 mmol/L   Potassium 4.1 3.5 - 5.3 mmol/L   Chloride 108 98 - 110 mmol/L   CO2 29 20 - 32 mmol/L   Calcium 9.0 8.6 - 10.3 mg/dL   Total Protein 6.4 6.1 - 8.1 g/dL   Albumin 4.3 3.6 - 5.1 g/dL   Globulin 2.1 1.9 - 3.7 g/dL (calc)   AG Ratio 2.0 1.0 - 2.5 (calc)   Total Bilirubin 0.6 0.2 - 1.2 mg/dL   Alkaline phosphatase (APISO) 75 40 - 115 U/L   AST 30 10 - 35 U/L   ALT 50 (H) 9 - 46 U/L  CBC with Differential/Platelet     Status: Abnormal   Collection Time: 03/18/17 12:32 PM  Result Value Ref Range   WBC 6.2 3.8 - 10.8 Thousand/uL   RBC 4.31 4.20 - 5.80 Million/uL   Hemoglobin 13.2 13.2 - 17.1 g/dL   HCT 38.4 (L) 38.5 - 50.0 %   MCV 89.1 80.0 - 100.0 fL   MCH 30.6 27.0 - 33.0 pg   MCHC 34.4 32.0 - 36.0 g/dL   RDW 13.3 11.0 - 15.0 %   Platelets 233 140 - 400 Thousand/uL   MPV 9.3 7.5 - 12.5 fL   Neutro Abs 4,303 1,500 - 7,800  cells/uL   Lymphs Abs 1,370 850 - 3,900 cells/uL   WBC mixed population 347 200 - 950 cells/uL   Eosinophils Absolute 130 15 - 500 cells/uL   Basophils Absolute 50 0 - 200 cells/uL   Neutrophils Relative % 69.4 %   Total Lymphocyte 22.1 %   Monocytes Relative 5.6 %   Eosinophils Relative 2.1 %   Basophils Relative 0.8 %  Lipid panel     Status: None   Collection Time: 03/18/17 12:32 PM  Result Value Ref Range   Cholesterol 157 <200 mg/dL   HDL 51 >40 mg/dL   Triglycerides 120 <150 mg/dL   LDL Cholesterol (Calc) 84 mg/dL (calc)    Comment: Reference range: <100 . Desirable range <100 mg/dL for  primary prevention;   <70 mg/dL for patients with CHD or diabetic patients  with > or = 2 CHD risk factors. Marland Kitchen LDL-C is now calculated using the Martin-Hopkins  calculation, which is a validated novel method providing  better accuracy than the Friedewald equation in the  estimation of LDL-C.  Cresenciano Genre et al. Annamaria Helling. 7322;025(42): 2061-2068  (http://education.[facility].com/faq/FAQ164)    Total CHOL/HDL Ratio 3.1 <5.0 (calc)   Non-HDL Cholesterol (Calc) 106 <130 mg/dL (calc)    Comment: For patients with diabetes plus 1 major ASCVD risk  factor, treating to a non-HDL-C goal of <100 mg/dL  (LDL-C of <70 mg/dL) is considered a therapeutic  option.   Hemoglobin A1c     Status: None   Collection Time: 03/18/17 12:32 PM  Result Value Ref Range   Hgb A1c MFr Bld 4.6 <5.7 % of total Hgb    Comment: For the purpose of screening for the presence of diabetes: . <5.7%       Consistent with the absence of diabetes 5.7-6.4%    Consistent with increased risk for diabetes             (prediabetes) > or =6.5%  Consistent with diabetes . This assay result is consistent with a decreased risk of diabetes. . Currently, no consensus exists regarding use of hemoglobin A1c for diagnosis of diabetes in children. . According to American Diabetes Association (ADA) guidelines, hemoglobin A1c  <7.0% represents optimal control in non-pregnant diabetic patients. Different metrics may apply to specific patient populations.  Standards of Medical Care in Diabetes(ADA). .    Mean Plasma Glucose 85 (calc)   eAG (mmol/L) 4.7 (calc)  VITAMIN D 25 Hydroxy (Vit-D Deficiency, Fractures)     Status: None   Collection Time: 03/18/17 12:32 PM  Result Value Ref Range   Vit D, 25-Hydroxy 41 30 - 100 ng/mL    Comment: Vitamin D Status         25-OH Vitamin D: . Deficiency:                    <20 ng/mL Insufficiency:             20 - 29 ng/mL Optimal:                 > or = 30 ng/mL . For 25-OH Vitamin D testing on patients on  D2-supplementation and patients for whom quantitation  of D2 and D3 fractions is required, the [facility](TM) 25-OH VIT D, (D2,D3), LC/MS/MS is recommended: order  code [id number] (patients >23yrs). . For more information on this test, go to: http://education.[facility].com/faq/FAQ163 (This link is being provided for  informational/educational purposes only.)   Insulin, random     Status: None   Collection Time: 03/18/17 12:32 PM  Result Value Ref Range   Insulin 16.2 2.0 - 19.6 uIU/mL    Comment: This insulin assay shows strong cross-reactivity for some insulin analogs (lispro, aspart, and glargine) and much lower cross-reactivity with others (detemir, glulisine).       PHQ2/9: Depression screen [facility] 2/9 06/03/2017 09/07/2016 05/30/2016 02/27/2016 01/16/2016  Decreased Interest 0 0 0 0 0  Down, Depressed, Hopeless 0 0 0 0 0  PHQ - 2 Score 0 0 0 0 0     Fall Risk: Fall Risk  06/03/2017 09/07/2016 05/30/2016 02/27/2016 01/16/2016  Falls in the past year? No No No No No    Functional Status Survey: Is the patient deaf or have difficulty hearing?: No Does the patient  have difficulty seeing, even when wearing glasses/contacts?: No Does the patient have difficulty concentrating, remembering, or making decisions?: No Does the patient have difficulty  walking or climbing stairs?: No Does the patient have difficulty dressing or bathing?: No Does the patient have difficulty doing errands alone such as visiting a doctor's office or shopping?: No 6CIT Screen 06/03/2017  What Year? 0 points  What month? 0 points  What time? 0 points  Count back from 20 0 points  Months in reverse 0 points  Repeat phrase 2 points  Total Score 2       Assessment & Plan   1. Encounter for routine history and physical exam for male  Discussed importance of 150 minutes of physical activity weekly, eat two servings of fish weekly, eat one serving of tree nuts ( cashews, pistachios, pecans, almonds.Marland Kitchen) every other day, eat 6 servings of fruit/vegetables daily and drink plenty of water and avoid sweet beverages.   2. Screening for prostate cancer  - PSA  3. Testicular hypofunction  - Testosterone ([name] PUMP) 20.25 MG/ACT (1.62%) GEL; Apply 4 Pump topically daily.  Dispense: 150 g; Refill: 1  4. Hypothyroidism, adult  - TSH  5. Long-term use of high-risk medication  - PSA  6. Dyslipidemia  - rosuvastatin (CRESTOR) 20 MG tablet; Take 1 tablet (20 mg total) by mouth daily.  Dispense: 90 tablet; Refill: 1  7. Insulin resistance syndrome  - Semaglutide (OZEMPIC) 1 MG/DOSE SOPN; Inject 1 mg into the skin once a week.  Dispense: 3 mL; Refill: 2  8. Benign essential HTN  - EKG 12-Lead   9. Right bundle branch block (RBBB)  On EKG - monitor, he does not want to see cardiologist at this time

## 2017-06-04 LAB — PSA: PSA: 1.3 ng/mL (ref <=4.0)

## 2017-06-04 LAB — TSH: TSH: 0.72 m[IU]/L (ref 0.40–4.50)

## 2017-06-19 ENCOUNTER — Ambulatory Visit: Payer: Managed Care, Other (non HMO) | Admitting: Family Medicine

## 2017-06-19 ENCOUNTER — Encounter: Payer: Self-pay | Admitting: Family Medicine

## 2017-06-19 VITALS — BP 128/74 | HR 82 | Temp 98.8°F | Resp 16 | Ht 70.0 in | Wt 248.9 lb

## 2017-06-19 DIAGNOSIS — E8881 Metabolic syndrome: Secondary | ICD-10-CM

## 2017-06-19 DIAGNOSIS — E291 Testicular hypofunction: Secondary | ICD-10-CM | POA: Diagnosis not present

## 2017-06-19 DIAGNOSIS — E039 Hypothyroidism, unspecified: Secondary | ICD-10-CM

## 2017-06-19 DIAGNOSIS — I4510 Unspecified right bundle-branch block: Secondary | ICD-10-CM | POA: Diagnosis not present

## 2017-06-19 DIAGNOSIS — Z8739 Personal history of other diseases of the musculoskeletal system and connective tissue: Secondary | ICD-10-CM | POA: Diagnosis not present

## 2017-06-19 NOTE — Progress Notes (Signed)
Name: Alan Campbell   [name]: [id number]    DOB: 04-24-1951   Date:06/19/2017       Progress Note  Subjective  Chief Complaint  Chief Complaint  Patient presents with  . Medication Refill  . Hypertension    Denies any symptoms  . Hypothyroidism    Stable  . Dyslipidemia  . Hypogonadism history of osteoporosis  . Metabolic Syndrome  . Obesity    Patient has losted 5 pounds since last visit    HPI  Metabolic syndrome: he was on Metformin for many years, but still had insulin resistance and weight was trending up, he has been on GLP agonists since Spring 2018. He is on higher dose of Ozempic 1 mg since Dec 2018 and has lost weight since.   HTN: taking medication daily and denies side effects, EKG was abnormal and patient would like to see cardiologist, seen by DR. Callwood in the past . . No chest pain or palpitatoin  Dyslipidemia: reviewed labs . HDL improved and LDL is at goal   Hypogonadism history of osteoporosis: he takes testosterone supplementation, he denies side effects. He states it seems to help with libido and energy level. He is due for bone density test.   Hypothyroidism: taking synthroid once daily and last TSH was normal. Denies change in bowel movements or dysphagia.   Patient Active Problem List   Diagnosis Date Noted  . Epididymal cyst 06/24/2016  . Anemia, iron deficiency 01/16/2016  . Benign essential HTN 12/30/2014  . Dyslipidemia 12/30/2014  . Adult hypothyroidism 12/30/2014  . Dysmetabolic syndrome 85/88/5027  . Headache, migraine 12/30/2014  . Obesity (BMI 30-39.9) 12/30/2014  . Arthritis, degenerative 12/30/2014  . Perennial allergic rhinitis with seasonal variation 12/30/2014  . 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  . Patella-femoral syndrome 12/03/2014  . Low back pain 04/03/2010  . Vitamin D deficiency 04/14/2009  . Abnormal presence of protein in urine 09/12/2007  . OP (osteoporosis) 05/05/2007    Past Surgical History:  Procedure  Laterality Date  . LAMINECTOMY  1985   L-5    Family History  Problem Relation Age of Onset  . Heart disease Father   . Hypothyroidism Sister   . Thyroid disease Sister   . Hypothyroidism Sister   . Migraines Sister   . Prostate cancer Neg Hx   . Bladder Cancer Neg Hx   . Kidney cancer Neg Hx     Social History   Socioeconomic History  . Marital status: Married    Spouse name: donna  . Number of children: 0  . Years of education: Not on file  . Highest education level: Not on file  Social Needs  . Financial resource strain: Not hard at all  . Food insecurity - worry: Never true  . Food insecurity - inability: Never true  . Transportation needs - medical: No  . Transportation needs - non-medical: No  Occupational History  . Occupation: managing/training   Tobacco Use  . Smoking status: Former Smoker    Packs/day: 0.50    Years: 5.00    Pack years: 2.50    Types: Cigarettes    Last attempt to quit: 10/03/1970    Years since quitting: 46.7  . Smokeless tobacco: Never Used  Substance and Sexual Activity  . Alcohol use: Yes    Alcohol/week: 2.4 oz    Types: 4 Standard drinks or equivalent per week  . Drug use: No  . Sexual activity: Yes    Partners: Female    Birth  control/protection: Post-menopausal  Other Topics Concern  . Not on file  Social History Narrative   Married since 1974, no children but they helped raise Tretha Sciara ( wife's niece), also helped raise their God-daughter Lyndee Leo     Current Outpatient Medications:  .  amLODipine-valsartan (EXFORGE) 10-320 MG tablet, Take 1 tablet by mouth daily., Disp: 90 tablet, Rfl: 1 .  aspirin 81 MG chewable tablet, Chew 1 tablet by mouth daily., Disp: , Rfl:  .  Cholecalciferol (VITAMIN D) 2000 units CAPS, Take 1 capsule (2,000 Units total) by mouth daily., Disp: 30 capsule, Rfl: 0 .  Multiple Vitamins-Minerals (CENTRUM SILVER 50+MEN) TABS, Take 1 tablet by mouth daily., Disp: , Rfl:  .  OMEGA-3 FATTY ACIDS PO, Take  1 tablet by mouth daily., Disp: , Rfl:  .  rosuvastatin (CRESTOR) 20 MG tablet, Take 1 tablet (20 mg total) by mouth daily., Disp: 90 tablet, Rfl: 1 .  Semaglutide (OZEMPIC) 1 MG/DOSE SOPN, Inject 1 mg into the skin once a week., Disp: 3 mL, Rfl: 2 .  SYNTHROID 200 MCG tablet, TAKE 1 TABLET BY MOUTH ONCE DAILY BEFOREBREAKFAST, Disp: 90 tablet, Rfl: 0 .  Testosterone ([name] PUMP) 20.25 MG/ACT (1.62%) GEL, Apply 4 Pump topically daily., Disp: 150 g, Rfl: 1 .  triamterene-hydrochlorothiazide (MAXZIDE-25) 37.5-25 MG tablet, Take 1 tablet by mouth daily., Disp: 90 tablet, Rfl: 1 .  Verapamil HCl CR 300 MG CP24, Take 1 capsule (300 mg total) by mouth daily., Disp: 90 each, Rfl: 1  No Known Allergies   ROS  Constitutional: Negative for fever or significant  weight change.  Respiratory: Negative for cough and shortness of breath.   Cardiovascular: Negative for chest pain or palpitations.  Gastrointestinal: Negative for abdominal pain, no bowel changes.  Musculoskeletal: Negative for gait problem or joint swelling.  Skin: Negative for rash.  Neurological: Negative for dizziness or headache.  No other specific complaints in a complete review of systems (except as listed in HPI above).  Objective  Vitals:   06/19/17 0858  BP: 128/74  Pulse: 82  Resp: 16  Temp: 98.8 F (37.1 C)  TempSrc: Oral  SpO2: 95%  Weight: 248 lb 14.4 oz (112.9 kg)  Height: 5\' 10"  (1.778 m)    Body mass index is 35.71 kg/m.  Physical Exam  Constitutional: Patient appears well-developed and well-nourished. Obese No distress.  HEENT: head atraumatic, normocephalic, pupils equal and reactive to light,  neck supple, throat within normal limits Cardiovascular: Normal rate, regular rhythm and normal heart sounds.  No murmur heard. No  BLE edema. Pulmonary/Chest: Effort normal and breath sounds normal. No respiratory distress. Abdominal: Soft.  There is no tenderness. Psychiatric: Patient has a normal mood and  affect. behavior is normal. Judgment and thought content normal.  Recent Results (from the past 2160 hour(s))  TSH     Status: None   Collection Time: 06/03/17 12:17 PM  Result Value Ref Range   TSH 0.72 0.40 - 4.50 mIU/L  PSA     Status: None   Collection Time: 06/03/17 12:17 PM  Result Value Ref Range   PSA 1.3 < OR = 4.0 ng/mL    Comment: The total PSA value from this assay system is  standardized against the WHO standard. The test  result will be approximately 20% lower when compared  to the equimolar-standardized total PSA (Beckman  Coulter). Comparison of serial PSA results should be  interpreted with this fact in mind. . This test was performed using the Siemens  chemiluminescent  method. Values obtained from  different assay methods cannot be used interchangeably. PSA levels, regardless of value, should not be interpreted as absolute evidence of the presence or absence of disease.      PHQ2/9: Depression screen [facility] 2/9 06/03/2017 09/07/2016 05/30/2016 02/27/2016 01/16/2016  Decreased Interest 0 0 0 0 0  Down, Depressed, Hopeless 0 0 0 0 0  PHQ - 2 Score 0 0 0 0 0     Fall Risk: Fall Risk  06/03/2017 09/07/2016 05/30/2016 02/27/2016 01/16/2016  Falls in the past year? No No No No No     Assessment & Plan  1. Testicular hypofunction  - DG Bone Density; Future  2. Hypothyroidism, adult  - DG Bone Density; Future  3. Right bundle branch block (RBBB)  - Ambulatory referral to Cardiology  4. History of osteoporosis  - DG Bone Density; Future  5. Dysmetabolic syndrome  Doing well on Ozempic

## 2017-07-01 ENCOUNTER — Ambulatory Visit
Admission: RE | Admit: 2017-07-01 | Discharge: 2017-07-01 | Disposition: A | Discharge status: Home / Self Care | Payer: Managed Care, Other (non HMO) | Source: Ambulatory Visit | Attending: Family Medicine | Admitting: Family Medicine

## 2017-07-01 DIAGNOSIS — Z8739 Personal history of other diseases of the musculoskeletal system and connective tissue: Secondary | ICD-10-CM | POA: Insufficient documentation

## 2017-07-01 DIAGNOSIS — E039 Hypothyroidism, unspecified: Secondary | ICD-10-CM | POA: Insufficient documentation

## 2017-07-01 DIAGNOSIS — E291 Testicular hypofunction: Secondary | ICD-10-CM | POA: Insufficient documentation

## 2017-07-12 ENCOUNTER — Other Ambulatory Visit: Payer: Self-pay | Admitting: Family Medicine

## 2017-07-12 NOTE — Telephone Encounter (Signed)
Refill request for thyroid medication. Synthroid   Last visit: 06/19/17   Lab Results  Component Value Date   TSH 0.72 06/03/2017     Follow up on 10/18/2017

## 2017-08-03 ENCOUNTER — Other Ambulatory Visit: Payer: Self-pay | Admitting: Family Medicine

## 2017-08-03 DIAGNOSIS — E291 Testicular hypofunction: Secondary | ICD-10-CM

## 2017-09-15 ENCOUNTER — Other Ambulatory Visit: Payer: Self-pay | Admitting: Family Medicine

## 2017-09-15 DIAGNOSIS — E8881 Metabolic syndrome: Secondary | ICD-10-CM

## 2017-10-14 ENCOUNTER — Other Ambulatory Visit: Payer: Self-pay | Admitting: Family Medicine

## 2017-10-14 DIAGNOSIS — E8881 Metabolic syndrome: Secondary | ICD-10-CM

## 2017-10-14 NOTE — Telephone Encounter (Signed)
Refill request for general medication. Ozempic to [facility]  Last office visit: 06/19/2017   Follow up on 10/18/2017

## 2017-10-18 ENCOUNTER — Ambulatory Visit: Payer: Managed Care, Other (non HMO) | Admitting: Family Medicine

## 2017-10-18 ENCOUNTER — Encounter: Payer: Self-pay | Admitting: Family Medicine

## 2017-10-18 VITALS — BP 126/70 | HR 67 | Resp 14 | Ht 70.0 in | Wt 242.1 lb

## 2017-10-18 DIAGNOSIS — E559 Vitamin D deficiency, unspecified: Secondary | ICD-10-CM

## 2017-10-18 DIAGNOSIS — E039 Hypothyroidism, unspecified: Secondary | ICD-10-CM

## 2017-10-18 DIAGNOSIS — E291 Testicular hypofunction: Secondary | ICD-10-CM | POA: Diagnosis not present

## 2017-10-18 DIAGNOSIS — I517 Cardiomegaly: Secondary | ICD-10-CM | POA: Diagnosis not present

## 2017-10-18 DIAGNOSIS — Z862 Personal history of diseases of the blood and blood-forming organs and certain disorders involving the immune mechanism: Secondary | ICD-10-CM | POA: Diagnosis not present

## 2017-10-18 DIAGNOSIS — E8881 Metabolic syndrome: Secondary | ICD-10-CM

## 2017-10-18 DIAGNOSIS — E785 Hyperlipidemia, unspecified: Secondary | ICD-10-CM

## 2017-10-18 DIAGNOSIS — I10 Essential (primary) hypertension: Secondary | ICD-10-CM | POA: Diagnosis not present

## 2017-10-18 DIAGNOSIS — E669 Obesity, unspecified: Secondary | ICD-10-CM

## 2017-10-18 MED ORDER — TESTOSTERONE 20.25 MG/ACT (1.62%) TD GEL: TRANSDERMAL | 2 refills | Status: DC

## 2017-10-18 MED ORDER — TRIAMTERENE-HCTZ 37.5-25 MG PO TABS: 1.0000 | ORAL_TABLET | Freq: Every day | ORAL | 1 refills | Status: DC

## 2017-10-18 MED ORDER — SEMAGLUTIDE (1 MG/DOSE) 2 MG/1.5ML ~~LOC~~ SOPN: 1.0000 mg | PEN_INJECTOR | SUBCUTANEOUS | 1 refills | Status: DC

## 2017-10-18 MED ORDER — ROSUVASTATIN CALCIUM 20 MG PO TABS: 20.0000 mg | ORAL_TABLET | Freq: Every day | ORAL | 1 refills | Status: DC

## 2017-10-18 MED ORDER — VERAPAMIL HCL ER 300 MG PO CP24: 1.0000 | ORAL_CAPSULE | Freq: Every day | ORAL | 1 refills | Status: DC

## 2017-10-18 MED ORDER — SYNTHROID 200 MCG PO TABS: ORAL_TABLET | ORAL | 1 refills | Status: DC

## 2017-10-18 NOTE — Progress Notes (Signed)
Name: Alan Campbell   [name]: [id number]    DOB: 1951-05-20   Date:10/18/2017       Progress Note  Subjective  Chief Complaint  Chief Complaint  Patient presents with  . Medication Refill  . Hypertension  . Hypothyroidism    HPI   Metabolic syndrome: he was on Metformin for many years, but still had insulin resistance and weight was trending up, he has been on GLP agonists since Spring 2018. He is on higher dose of Ozempic 1 mg since Dec 2018 and has lost weight since, weight was 253 lbs and is down 11 lbs since   HTN: taking medication daily and denies side effects, EKG was abnormal seen by Dr. Clayborn Bigness and had stress test and echo done 07/2017, mild right ventricular enlargement otherwise normal, he occasionally snores, denies feeling tired during the day, only takes naps on Sundays  Dyslipidemia: reviewed labs . HDL improved and LDL is at goal , recheck labs  Hypogonadism history of osteoporosis: he takes testosterone supplementation, he denies side effects. He states it seems to help with libido and energy level. He took [place] and is on testosterone supplementation, last bone density 06/2017 normal  Hypothyroidism: taking synthroid once daily and last TSH was normal, we will recheck level. Denies change in bowel movements or dysphagia. No dry skin     Patient Active Problem List   Diagnosis Date Noted  . Epididymal cyst 06/24/2016  . Anemia, iron deficiency 01/16/2016  . Benign essential HTN 12/30/2014  . Dyslipidemia 12/30/2014  . Adult hypothyroidism 12/30/2014  . Dysmetabolic syndrome 06/26/3233  . Headache, migraine 12/30/2014  . Obesity (BMI 30-39.9) 12/30/2014  . Arthritis, degenerative 12/30/2014  . Perennial allergic rhinitis with seasonal variation 12/30/2014  . 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  . Patella-femoral syndrome 12/03/2014  . Low back pain 04/03/2010  . Vitamin D deficiency 04/14/2009  . Abnormal presence of protein in urine 09/12/2007  . OP  (osteoporosis) 05/05/2007    Past Surgical History:  Procedure Laterality Date  . LAMINECTOMY  1985   L-5    Family History  Problem Relation Age of Onset  . Heart disease Father   . Hypothyroidism Sister   . Thyroid disease Sister   . Hypothyroidism Sister   . Migraines Sister   . Prostate cancer Neg Hx   . Bladder Cancer Neg Hx   . Kidney cancer Neg Hx     Social History   Socioeconomic History  . Marital status: Married    Spouse name: donna  . Number of children: 0  . Years of education: Not on file  . Highest education level: Not on file  Occupational History  . Occupation: Investment banker, corporate   Social Needs  . Financial resource strain: Not hard at all  . Food insecurity:    Worry: Never true    Inability: Never true  . Transportation needs:    Medical: No    Non-medical: No  Tobacco Use  . Smoking status: Former Smoker    Packs/day: 0.50    Years: 5.00    Pack years: 2.50    Types: Cigarettes    Last attempt to quit: 10/03/1970    Years since quitting: 47.0  . Smokeless tobacco: Never Used  Substance and Sexual Activity  . Alcohol use: Yes    Alcohol/week: 2.4 oz    Types: 4 Standard drinks or equivalent per week  . Drug use: No  . Sexual activity: Yes    Partners: Female  Birth control/protection: Post-menopausal  Lifestyle  . Physical activity:    Days per week: 2 days    Minutes per session: 30 min  . Stress: Not at all  Relationships  . Social connections:    Talks on phone: More than three times a week    Gets together: Once a week    Attends religious service: More than 4 times per year    Active member of club or organization: Yes    Attends meetings of clubs or organizations: More than 4 times per year    Relationship status: Married  . Intimate partner violence:    Fear of current or ex partner: No    Emotionally abused: No    Physically abused: No    Forced sexual activity: No  Other Topics Concern  . Not on file  Social  History Narrative   Married since 1974, no children but they helped raise Tretha Sciara ( wife's niece), also helped raise their God-daughter Lyndee Leo     Current Outpatient Medications:  .  amLODipine-valsartan (EXFORGE) 10-320 MG tablet, Take 1 tablet by mouth daily., Disp: 90 tablet, Rfl: 1 .  Cholecalciferol (VITAMIN D) 2000 units CAPS, Take 1 capsule (2,000 Units total) by mouth daily., Disp: 30 capsule, Rfl: 0 .  Multiple Vitamins-Minerals (CENTRUM SILVER 50+MEN) TABS, Take 1 tablet by mouth daily., Disp: , Rfl:  .  OMEGA-3 FATTY ACIDS PO, Take 1 tablet by mouth daily., Disp: , Rfl:  .  rosuvastatin (CRESTOR) 20 MG tablet, Take 1 tablet (20 mg total) by mouth daily., Disp: 90 tablet, Rfl: 1 .  Semaglutide (OZEMPIC) 1 MG/DOSE SOPN, Inject 1 mg into the skin once a week., Disp: 9 mL, Rfl: 1 .  SYNTHROID 200 MCG tablet, TAKE 1 TABLET BY MOUTH ONCE DAILY ON AN EMPTY STOMACH. WAIT 30 MINUTES BEFORE TAKING OTHER MEDS., Disp: 90 tablet, Rfl: 1 .  Testosterone 20.25 MG/ACT (1.62%) GEL, APPLY 4 PUMPS AS DIRECTED DAILY, Disp: 150 g, Rfl: 2 .  triamterene-hydrochlorothiazide (MAXZIDE-25) 37.5-25 MG tablet, Take 1 tablet by mouth daily., Disp: 90 tablet, Rfl: 1 .  Verapamil HCl CR 300 MG CP24, Take 1 capsule (300 mg total) by mouth daily., Disp: 90 each, Rfl: 1  No Known Allergies   ROS  Constitutional: Negative for fever or significant  weight change.  Respiratory: Negative for cough and shortness of breath.   Cardiovascular: Negative for chest pain or palpitations.  Gastrointestinal: Negative for abdominal pain, no bowel changes.  Musculoskeletal: Negative for gait problem or joint swelling.  Skin: Negative for rash.  Neurological: Negative for dizziness or headache.  No other specific complaints in a complete review of systems (except as listed in HPI above).  Objective  Vitals:   10/18/17 0751  BP: 126/70  Pulse: 67  Resp: 14  SpO2: 96%  Weight: 242 lb 1.6 oz (109.8 kg)  Height: 5\' 10"   (1.778 m)    Body mass index is 34.74 kg/m.  Physical Exam  Constitutional: Patient appears well-developed and well-nourished. Obese  No distress.  HEENT: head atraumatic, normocephalic, pupils equal and reactive to light,  neck supple, throat within normal limits Cardiovascular: Normal rate, regular rhythm and normal heart sounds.  No murmur heard. Trace  BLE edema. Pulmonary/Chest: Effort normal and breath sounds normal. No respiratory distress. Abdominal: Soft.  There is no tenderness. Psychiatric: Patient has a normal mood and affect. behavior is normal. Judgment and thought content normal.  PHQ2/9: Depression screen [facility] 2/9 06/03/2017 09/07/2016 05/30/2016 02/27/2016 01/16/2016  Decreased Interest 0 0 0 0 0  Down, Depressed, Hopeless 0 0 0 0 0  PHQ - 2 Score 0 0 0 0 0    Fall Risk: Fall Risk  10/18/2017 06/03/2017 09/07/2016 05/30/2016 02/27/2016  Falls in the past year? No No No No No    Functional Status Survey: Is the patient deaf or have difficulty hearing?: No Does the patient have difficulty seeing, even when wearing glasses/contacts?: No Does the patient have difficulty concentrating, remembering, or making decisions?: No Does the patient have difficulty walking or climbing stairs?: No Does the patient have difficulty dressing or bathing?: No Does the patient have difficulty doing errands alone such as visiting a doctor's office or shopping?: No   Assessment & Plan  1. Benign essential HTN  - Verapamil HCl CR 300 MG CP24; Take 1 capsule (300 mg total) by mouth daily.  Dispense: 90 each; Refill: 1 - triamterene-hydrochlorothiazide (MAXZIDE-25) 37.5-25 MG tablet; Take 1 tablet by mouth daily.  Dispense: 90 tablet; Refill: 1 - COMPLETE METABOLIC PANEL WITH GFR  2. Hypothyroidism, adult  - SYNTHROID 200 MCG tablet; TAKE 1 TABLET BY MOUTH ONCE DAILY ON AN EMPTY STOMACH. WAIT 30 MINUTES BEFORE TAKING OTHER MEDS.  Dispense: 90 tablet; Refill: 1 - TSH  3. Dysmetabolic  syndrome  - Insulin, random  4. Testicular hypofunction  - Testosterone 20.25 MG/ACT (1.62%) GEL; APPLY 4 PUMPS AS DIRECTED DAILY  Dispense: 150 g; Refill: 2  5. Dyslipidemia  - rosuvastatin (CRESTOR) 20 MG tablet; Take 1 tablet (20 mg total) by mouth daily.  Dispense: 90 tablet; Refill: 1 - Lipid panel  6. Insulin resistance syndrome  - Semaglutide (OZEMPIC) 1 MG/DOSE SOPN; Inject 1 mg into the skin once a week.  Dispense: 9 mL; Refill: 1  7. History of iron deficiency anemia  Last labs were normal   8. Vitamin D deficiency  Continue Vitamin D otc   9. Obesity (BMI 30-39.9)  - Insulin, random   10. Mild right ventricular hypertrophy  ESS of 4 We will hold off on sleep study at this time

## 2017-10-21 LAB — COMPLETE METABOLIC PANEL WITH GFR
AG Ratio: 2.3 (calc) (ref 1.0–2.5)
ALBUMIN MSPROF: 4.5 g/dL (ref 3.6–5.1)
ALKALINE PHOSPHATASE (APISO): 76 U/L (ref 40–115)
ALT: 35 U/L (ref 9–46)
AST: 22 U/L (ref 10–35)
BUN: 24 mg/dL (ref 7–25)
CALCIUM: 9.1 mg/dL (ref 8.6–10.3)
CHLORIDE: 109 mmol/L (ref 98–110)
CO2: 27 mmol/L (ref 20–32)
Creat: 1.01 mg/dL (ref 0.70–1.25)
GFR, Est African American: 89 mL/min/{1.73_m2} (ref >=60)
GFR, Est Non African American: 77 mL/min/{1.73_m2} (ref >=60)
GLOBULIN: 2 g/dL (ref 1.9–3.7)
Glucose, Bld: 102 mg/dL — ABNORMAL HIGH (ref 65–99)
POTASSIUM: 3.9 mmol/L (ref 3.5–5.3)
Sodium: 144 mmol/L (ref 135–146)
Total Bilirubin: 0.7 mg/dL (ref 0.2–1.2)
Total Protein: 6.5 g/dL (ref 6.1–8.1)

## 2017-10-21 LAB — LIPID PANEL
CHOLESTEROL: 150 mg/dL (ref <200)
HDL: 43 mg/dL (ref >40)
LDL Cholesterol (Calc): 89 mg/dL (calc)
Non-HDL Cholesterol (Calc): 107 mg/dL (calc) (ref <130)
Total CHOL/HDL Ratio: 3.5 (calc) (ref <5.0)
Triglycerides: 86 mg/dL (ref <150)

## 2017-10-21 LAB — TSH: TSH: 0.09 mIU/L — ABNORMAL LOW (ref 0.40–4.50)

## 2017-10-21 LAB — INSULIN, RANDOM: Insulin: 20.6 u[IU]/mL — ABNORMAL HIGH (ref 2.0–19.6)

## 2017-11-14 ENCOUNTER — Other Ambulatory Visit: Payer: Self-pay | Admitting: Family Medicine

## 2017-11-14 DIAGNOSIS — I10 Essential (primary) hypertension: Secondary | ICD-10-CM

## 2018-02-21 ENCOUNTER — Encounter: Payer: Self-pay | Admitting: Family Medicine

## 2018-02-21 ENCOUNTER — Ambulatory Visit: Payer: Managed Care, Other (non HMO) | Admitting: Family Medicine

## 2018-02-21 VITALS — BP 118/70 | HR 73 | Temp 98.6°F | Resp 16 | Ht 70.0 in | Wt 244.4 lb

## 2018-02-21 DIAGNOSIS — E785 Hyperlipidemia, unspecified: Secondary | ICD-10-CM

## 2018-02-21 DIAGNOSIS — Z8739 Personal history of other diseases of the musculoskeletal system and connective tissue: Secondary | ICD-10-CM

## 2018-02-21 DIAGNOSIS — I10 Essential (primary) hypertension: Secondary | ICD-10-CM

## 2018-02-21 DIAGNOSIS — E039 Hypothyroidism, unspecified: Secondary | ICD-10-CM

## 2018-02-21 DIAGNOSIS — E559 Vitamin D deficiency, unspecified: Secondary | ICD-10-CM

## 2018-02-21 DIAGNOSIS — E8881 Metabolic syndrome: Secondary | ICD-10-CM | POA: Diagnosis not present

## 2018-02-21 DIAGNOSIS — E291 Testicular hypofunction: Secondary | ICD-10-CM

## 2018-02-21 DIAGNOSIS — Z23 Encounter for immunization: Secondary | ICD-10-CM | POA: Diagnosis not present

## 2018-02-21 LAB — TSH: TSH: 0.19 mIU/L — ABNORMAL LOW (ref 0.40–4.50)

## 2018-02-21 MED ORDER — TRIAMTERENE-HCTZ 37.5-25 MG PO TABS: 1.0000 | ORAL_TABLET | Freq: Every day | ORAL | 1 refills | Status: DC

## 2018-02-21 MED ORDER — VERAPAMIL HCL ER 300 MG PO CP24: 1.0000 | ORAL_CAPSULE | Freq: Every day | ORAL | 1 refills | Status: DC

## 2018-02-21 MED ORDER — AMLODIPINE BESYLATE-VALSARTAN 10-320 MG PO TABS: 1.0000 | ORAL_TABLET | Freq: Every day | ORAL | 1 refills | Status: DC

## 2018-02-21 MED ORDER — SEMAGLUTIDE (1 MG/DOSE) 2 MG/1.5ML ~~LOC~~ SOPN: 1.0000 mg | PEN_INJECTOR | SUBCUTANEOUS | 1 refills | Status: DC

## 2018-02-21 MED ORDER — ROSUVASTATIN CALCIUM 20 MG PO TABS: 20.0000 mg | ORAL_TABLET | Freq: Every day | ORAL | 1 refills | Status: DC

## 2018-02-21 MED ORDER — TESTOSTERONE 20.25 MG/ACT (1.62%) TD GEL: TRANSDERMAL | 2 refills | Status: DC

## 2018-02-21 NOTE — Progress Notes (Signed)
Name: Alan Campbell   [name]: [id number]    DOB: 1950/07/30   Date:02/21/2018       Progress Note  Subjective  Chief Complaint  Chief Complaint  Patient presents with  . Follow-up    patient is here for his 4 month f/u  . Hypertension  . Metabolic Syndrome  . Dyslipidemia  . Hypogonadism  . Hypothyroidism    HPI  Metabolic syndrome: he was on Metformin for many years, but still had insulin resistance and weight was trending up, he has been on GLP agonistssince Spring 2018. He is on higher dose of Ozempic 1 mg since Dec 2018 and has lost weight since, weight was 253 lbs and was down 11 lbs , but gained 2 lbs since last visit. He states medication helps control his appetite   HTN: taking medication daily and denies side effects,EKG was abnormal seen by Dr. Clayborn Bigness and had stress test and echo done 07/2017, mild right ventricular enlargement otherwise normal, he occasionally snores, denies feeling tired during the day. BP has been at goal, no dizziness.   Dyslipidemia: reviewed labs . HDL improved and LDL is at goal , reviewed labs and is doing well.   Hypogonadism history of osteoporosis: he takes testosterone supplementation, he denies side effects. He states it seems to help with libido and energy level. He took [place] and is on testosterone supplementation, last bone density 06/2017 normal. We will recheck PSA and testosterone in Dec during his CPE  Hypothyroidism: taking synthroid once daily and last TSHwas suppressed and we adjusted dose to 200 mcg daily and half on Sundays. Weight has been stable. No change in bowel movements   Patient Active Problem List   Diagnosis Date Noted  . Epididymal cyst 06/24/2016  . Anemia, iron deficiency 01/16/2016  . Benign essential HTN 12/30/2014  . Dyslipidemia 12/30/2014  . Adult hypothyroidism 12/30/2014  . Dysmetabolic syndrome 65/78/4696  . Headache, migraine 12/30/2014  . Obesity (BMI 30-39.9) 12/30/2014  . Arthritis, degenerative  12/30/2014  . Perennial allergic rhinitis with seasonal variation 12/30/2014  . 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  . Patella-femoral syndrome 12/03/2014  . Low back pain 04/03/2010  . Vitamin D deficiency 04/14/2009  . Abnormal presence of protein in urine 09/12/2007  . OP (osteoporosis) 05/05/2007    Past Surgical History:  Procedure Laterality Date  . LAMINECTOMY  1985   L-5    Family History  Problem Relation Age of Onset  . Heart disease Father   . Hypothyroidism Sister   . Thyroid disease Sister   . Hypothyroidism Sister   . Migraines Sister   . Prostate cancer Neg Hx   . Bladder Cancer Neg Hx   . Kidney cancer Neg Hx     Social History   Socioeconomic History  . Marital status: Married    Spouse name: donna  . Number of children: 0  . Years of education: Not on file  . Highest education level: Not on file  Occupational History  . Occupation: Investment banker, corporate   Social Needs  . Financial resource strain: Not hard at all  . Food insecurity:    Worry: Never true    Inability: Never true  . Transportation needs:    Medical: No    Non-medical: No  Tobacco Use  . Smoking status: Former Smoker    Packs/day: 0.50    Years: 5.00    Pack years: 2.50    Types: Cigarettes    Last attempt to quit: 10/03/1970    Years  since quitting: 47.4  . Smokeless tobacco: Never Used  Substance and Sexual Activity  . Alcohol use: Yes    Alcohol/week: 4.0 standard drinks    Types: 4 Standard drinks or equivalent per week  . Drug use: No  . Sexual activity: Yes    Partners: Female    Birth control/protection: Post-menopausal  Lifestyle  . Physical activity:    Days per week: 2 days    Minutes per session: 30 min  . Stress: Not at all  Relationships  . Social connections:    Talks on phone: More than three times a week    Gets together: Once a week    Attends religious service: More than 4 times per year    Active member of club or organization: Yes    Attends  meetings of clubs or organizations: More than 4 times per year    Relationship status: Married  . Intimate partner violence:    Fear of current or ex partner: No    Emotionally abused: No    Physically abused: No    Forced sexual activity: No  Other Topics Concern  . Not on file  Social History Narrative   Married since 1974, no children but they helped raise Tretha Sciara ( wife's niece), also helped raise their God-daughter Lyndee Leo     Current Outpatient Medications:  .  amLODipine-valsartan (EXFORGE) 10-320 MG tablet, Take 1 tablet by mouth daily., Disp: 90 tablet, Rfl: 1 .  Cholecalciferol (VITAMIN D) 2000 units CAPS, Take 1 capsule (2,000 Units total) by mouth daily., Disp: 30 capsule, Rfl: 0 .  Multiple Vitamins-Minerals (CENTRUM SILVER 50+MEN) TABS, Take 1 tablet by mouth daily., Disp: , Rfl:  .  OMEGA-3 FATTY ACIDS PO, Take 1 tablet by mouth daily., Disp: , Rfl:  .  rosuvastatin (CRESTOR) 20 MG tablet, Take 1 tablet (20 mg total) by mouth daily., Disp: 90 tablet, Rfl: 1 .  Semaglutide (OZEMPIC) 1 MG/DOSE SOPN, Inject 1 mg into the skin once a week., Disp: 9 mL, Rfl: 1 .  SYNTHROID 200 MCG tablet, TAKE 1 TABLET BY MOUTH ONCE DAILY ON AN EMPTY STOMACH. WAIT 30 MINUTES BEFORE TAKING OTHER MEDS., Disp: 90 tablet, Rfl: 1 .  Testosterone 20.25 MG/ACT (1.62%) GEL, APPLY 4 PUMPS AS DIRECTED DAILY, Disp: 150 g, Rfl: 2 .  triamterene-hydrochlorothiazide (MAXZIDE-25) 37.5-25 MG tablet, Take 1 tablet by mouth daily., Disp: 90 tablet, Rfl: 1 .  Verapamil HCl CR 300 MG CP24, Take 1 capsule (300 mg total) by mouth daily., Disp: 90 each, Rfl: 1  No Known Allergies   ROS  Constitutional: Negative for fever or weight change.  Respiratory: Negative for cough and shortness of breath.   Cardiovascular: Negative for chest pain or palpitations.  Gastrointestinal: Negative for abdominal pain, no bowel changes.  Musculoskeletal: Negative for gait problem or joint swelling.  Skin: Negative for rash.   Neurological: Negative for dizziness or headache.  No other specific complaints in a complete review of systems (except as listed in HPI above).  Objective  Vitals:   02/21/18 0748  BP: 118/70  Pulse: 73  Resp: 16  Temp: 98.6 F (37 C)  TempSrc: Oral  SpO2: 99%  Weight: 244 lb 6.4 oz (110.9 kg)  Height: 5\' 10"  (1.778 m)    Body mass index is 35.07 kg/m.  Physical Exam  Constitutional: Patient appears well-developed and well-nourished. Obese No distress.  HEENT: head atraumatic, normocephalic, pupils equal and reactive to light, neck supple, throat within normal limits Cardiovascular: Normal  rate, regular rhythm and normal heart sounds.  No murmur heard. Trace  BLE edema. Pulmonary/Chest: Effort normal and breath sounds normal. No respiratory distress. Abdominal: Soft.  There is no tenderness. Psychiatric: Patient has a normal mood and affect. behavior is normal. Judgment and thought content normal.  PHQ2/9: Depression screen [facility] 2/9 02/21/2018 06/03/2017 09/07/2016 05/30/2016 02/27/2016  Decreased Interest 0 0 0 0 0  Down, Depressed, Hopeless 0 0 0 0 0  PHQ - 2 Score 0 0 0 0 0  Altered sleeping 0 - - - -  Tired, decreased energy 0 - - - -  Change in appetite 0 - - - -  Feeling bad or failure about yourself  0 - - - -  Trouble concentrating 0 - - - -  Moving slowly or fidgety/restless 0 - - - -  Suicidal thoughts 0 - - - -  PHQ-9 Score 0 - - - -  Difficult doing work/chores Not difficult at all - - - -     Fall Risk: Fall Risk  02/21/2018 10/18/2017 06/03/2017 09/07/2016 05/30/2016  Falls in the past year? No No No No No     Functional Status Survey: Is the patient deaf or have difficulty hearing?: No Does the patient have difficulty seeing, even when wearing glasses/contacts?: No Does the patient have difficulty concentrating, remembering, or making decisions?: No Does the patient have difficulty walking or climbing stairs?: No Does the patient have difficulty  dressing or bathing?: No Does the patient have difficulty doing errands alone such as visiting a doctor's office or shopping?: No    Assessment & Plan  1. Benign essential HTN  - amLODipine-valsartan (EXFORGE) 10-320 MG tablet; Take 1 tablet by mouth daily.  Dispense: 90 tablet; Refill: 1 - triamterene-hydrochlorothiazide (MAXZIDE-25) 37.5-25 MG tablet; Take 1 tablet by mouth daily.  Dispense: 90 tablet; Refill: 1 - Verapamil HCl CR 300 MG CP24; Take 1 capsule (300 mg total) by mouth daily.  Dispense: 90 each; Refill: 1  2. Dyslipidemia  - rosuvastatin (CRESTOR) 20 MG tablet; Take 1 tablet (20 mg total) by mouth daily.  Dispense: 90 tablet; Refill: 1  3. Insulin resistance syndrome  - Semaglutide (OZEMPIC) 1 MG/DOSE SOPN; Inject 1 mg into the skin once a week.  Dispense: 9 mL; Refill: 1  4. Hypothyroidism, adult  - TSH  5. Testicular hypofunction  - Testosterone 20.25 MG/ACT (1.62%) GEL; APPLY 4 PUMPS AS DIRECTED DAILY  Dispense: 150 g; Refill: 2  6. Dysmetabolic syndrome  On Ozempic  7. Morbid obesity ([place])  Discussed with the patient the risk posed by an increased BMI. Discussed importance of portion control, calorie counting and at least 150 minutes of physical activity weekly. Avoid sweet beverages and drink more water. Eat at least 6 servings of fruit and vegetables daily   8. Vitamin D deficiency  Continue vitamin D supplementation   9. History of osteoporosis   10. Needs flu shot  High dose flu today

## 2018-02-23 ENCOUNTER — Other Ambulatory Visit: Payer: Self-pay | Admitting: Family Medicine

## 2018-02-23 MED ORDER — LEVOTHYROXINE SODIUM 175 MCG PO TABS: 175.0000 ug | ORAL_TABLET | Freq: Every day | ORAL | 0 refills | Status: DC

## 2018-02-26 ENCOUNTER — Other Ambulatory Visit: Payer: Self-pay | Admitting: Family Medicine

## 2018-05-28 ENCOUNTER — Encounter: Payer: Self-pay | Admitting: Family Medicine

## 2018-05-28 ENCOUNTER — Ambulatory Visit (INDEPENDENT_AMBULATORY_CARE_PROVIDER_SITE_OTHER): Payer: Commercial Managed Care - PPO | Admitting: Family Medicine

## 2018-05-28 VITALS — BP 130/80 | HR 68 | Temp 98.1°F | Resp 16 | Ht 70.0 in | Wt 245.0 lb

## 2018-05-28 DIAGNOSIS — Z0000 Encounter for general adult medical examination without abnormal findings: Secondary | ICD-10-CM | POA: Diagnosis not present

## 2018-05-28 DIAGNOSIS — E559 Vitamin D deficiency, unspecified: Secondary | ICD-10-CM | POA: Diagnosis not present

## 2018-05-28 DIAGNOSIS — E785 Hyperlipidemia, unspecified: Secondary | ICD-10-CM

## 2018-05-28 DIAGNOSIS — Z125 Encounter for screening for malignant neoplasm of prostate: Secondary | ICD-10-CM

## 2018-05-28 DIAGNOSIS — I10 Essential (primary) hypertension: Secondary | ICD-10-CM

## 2018-05-28 DIAGNOSIS — E8881 Metabolic syndrome: Secondary | ICD-10-CM

## 2018-05-28 DIAGNOSIS — E038 Other specified hypothyroidism: Secondary | ICD-10-CM

## 2018-05-28 DIAGNOSIS — E291 Testicular hypofunction: Secondary | ICD-10-CM

## 2018-05-28 NOTE — Patient Instructions (Signed)
Preventive Care 65 Years and Older, Male Preventive care refers to lifestyle choices and visits with your health care provider that can promote health and wellness. What does preventive care include?  A yearly physical exam. This is also called an annual well check.  Dental exams once or twice a year.  Routine eye exams. Ask your health care provider how often you should have your eyes checked.  Personal lifestyle choices, including: ? Daily care of your teeth and gums. ? Regular physical activity. ? Eating a healthy diet. ? Avoiding tobacco and drug use. ? Limiting alcohol use. ? Practicing safe sex. ? Taking low doses of aspirin every day. ? Taking vitamin and mineral supplements as recommended by your health care provider. What happens during an annual well check? The services and screenings done by your health care provider during your annual well check will depend on your age, overall health, lifestyle risk factors, and family history of disease. Counseling Your health care provider may ask you questions about your:  Alcohol use.  Tobacco use.  Drug use.  Emotional well-being.  Home and relationship well-being.  Sexual activity.  Eating habits.  History of falls.  Memory and ability to understand (cognition).  Work and work environment.  Screening You may have the following tests or measurements:  Height, weight, and BMI.  Blood pressure.  Lipid and cholesterol levels. These may be checked every 5 years, or more frequently if you are over 50 years old.  Skin check.  Lung cancer screening. You may have this screening every year starting at age 55 if you have a 30-pack-year history of smoking and currently smoke or have quit within the past 15 years.  Fecal occult blood test (FOBT) of the stool. You may have this test every year starting at age 50.  Flexible sigmoidoscopy or colonoscopy. You may have a sigmoidoscopy every 5 years or a colonoscopy every 10  years starting at age 50.  Prostate cancer screening. Recommendations will vary depending on your family history and other risks.  Hepatitis C blood test.  Hepatitis B blood test.  Sexually transmitted disease (STD) testing.  Diabetes screening. This is done by checking your blood sugar (glucose) after you have not eaten for a while (fasting). You may have this done every 1-3 years.  Abdominal aortic aneurysm (AAA) screening. You may need this if you are a current or former smoker.  Osteoporosis. You may be screened starting at age 70 if you are at high risk.  Talk with your health care provider about your test results, treatment options, and if necessary, the need for more tests. Vaccines Your health care provider may recommend certain vaccines, such as:  Influenza vaccine. This is recommended every year.  Tetanus, diphtheria, and acellular pertussis (Tdap, Td) vaccine. You may need a Td booster every 10 years.  Varicella vaccine. You may need this if you have not been vaccinated.  Zoster vaccine. You may need this after age 60.  Measles, mumps, and rubella (MMR) vaccine. You may need at least one dose of MMR if you were born in 1957 or later. You may also need a second dose.  Pneumococcal 13-valent conjugate (PCV13) vaccine. One dose is recommended after age 68.  Pneumococcal polysaccharide (PPSV23) vaccine. One dose is recommended after age 68.  Meningococcal vaccine. You may need this if you have certain conditions.  Hepatitis A vaccine. You may need this if you have certain conditions or if you travel or work in places where you   may be exposed to hepatitis A.  Hepatitis B vaccine. You may need this if you have certain conditions or if you travel or work in places where you may be exposed to hepatitis B.  Haemophilus influenzae type b (Hib) vaccine. You may need this if you have certain risk factors.  Talk to your health care provider about which screenings and vaccines  you need and how often you need them. This information is not intended to replace advice given to you by your health care provider. Make sure you discuss any questions you have with your health care provider. Document Released: 07/01/2015 Document Revised: 02/22/2016 Document Reviewed: 04/05/2015 Elsevier Interactive Patient Education  2018 [employer]  

## 2018-05-28 NOTE — Progress Notes (Signed)
Name: Alan Campbell   [name]: [id number]    DOB: June 23, 1950   Date:05/28/2018       Progress Note  Subjective  Chief Complaint  Chief Complaint  Patient presents with  . Annual Exam    HPI  Patient presents for annual CPE and follow up  Hypothyroidism: he is on lower dose of synthroid, now on 175 mcg daily and is due for recheck level, TSH has been suppressed for a little while. No weight changes, no palpitation or heat/cold intolerance.   Hypogonadism: he has been forgetting to use testosterone on a regular basis, but he has used daily for the past week. He does not noticed any difference in symptoms No side effects.   USPSTF grade A and B recommendations:  Diet: she is eating more salads, more vegetables and avoiding a lot of starches and fast food Exercise: about twice a week, for about 30 minutes   Depression:  Depression screen [facility] 2/9 05/28/2018 02/21/2018 06/03/2017 09/07/2016 05/30/2016  Decreased Interest 0 0 0 0 0  Down, Depressed, Hopeless 0 0 0 0 0  PHQ - 2 Score 0 0 0 0 0  Altered sleeping - 0 - - -  Tired, decreased energy - 0 - - -  Change in appetite - 0 - - -  Feeling bad or failure about yourself  - 0 - - -  Trouble concentrating - 0 - - -  Moving slowly or fidgety/restless - 0 - - -  Suicidal thoughts - 0 - - -  PHQ-9 Score - 0 - - -  Difficult doing work/chores - Not difficult at all - - -    Hypertension:  BP Readings from Last 3 Encounters:  05/28/18 130/80  02/21/18 118/70  10/18/17 126/70    Obesity: Wt Readings from Last 3 Encounters:  05/28/18 245 lb (111.1 kg)  02/21/18 244 lb 6.4 oz (110.9 kg)  10/18/17 242 lb 1.6 oz (109.8 kg)   BMI Readings from Last 3 Encounters:  05/28/18 35.15 kg/m  02/21/18 35.07 kg/m  10/18/17 34.74 kg/m     Lipids:  Lab Results  Component Value Date   CHOL 150 10/18/2017   CHOL 157 03/18/2017   CHOL 123 11/27/2016   Lab Results  Component Value Date   HDL 43 10/18/2017   HDL 51 03/18/2017   HDL 33  (L) 11/27/2016   Lab Results  Component Value Date   LDLCALC 89 10/18/2017   LDLCALC 84 03/18/2017   LDLCALC 73 11/27/2016   Lab Results  Component Value Date   TRIG 86 10/18/2017   TRIG 120 03/18/2017   TRIG 85 11/27/2016   Lab Results  Component Value Date   CHOLHDL 3.5 10/18/2017   CHOLHDL 3.1 03/18/2017   CHOLHDL 3.7 11/27/2016   No results found for: LDLDIRECT Glucose:  Glucose, Bld  Date Value Ref Range Status  10/18/2017 102 (H) 65 - 99 mg/dL Final    Comment:    .            Fasting reference interval . For someone without known diabetes, a glucose value between 100 and 125 mg/dL is consistent with prediabetes and should be confirmed with a follow-up test. .   03/18/2017 99 65 - 99 mg/dL Final    Comment:    .            Fasting reference interval .   09/14/2016 126 (H) 65 - 99 mg/dL Final      Office Visit  from 05/28/2018 in [facility]  AUDIT-C Score  4     Married STD testing and prevention (HIV/chl/gon/syphilis): N/A Hep C: negative screen 2016  Skin cancer: discussed atypical lesions  Colorectal cancer: up to date  Prostate cancer: today  Lab Results  Component Value Date   PSA 1.3 06/03/2017   PSA 0.7 05/03/2014    IPSS Questionnaire (AUA-7): Over the past month.   1)  How often have you had a sensation of not emptying your bladder completely after you finish urinating?  0 - Not at all  2)  How often have you had to urinate again less than two hours after you finished urinating? 1 - Less than 1 time in 5  3)  How often have you found you stopped and started again several times when you urinated?  0 - Not at all  4) How difficult have you found it to postpone urination?  0 - Not at all  5) How often have you had a weak urinary stream?  0 - Not at all  6) How often have you had to push or strain to begin urination?  0 - Not at all  7) How many times did you most typically get up to urinate from the time you went to  bed until the time you got up in the morning?  1 - 1 time  Total score:  0-7 mildly symptomatic   8-19 moderately symptomatic   20-35 severely symptomatic    Lung cancer:  Low Dose CT Chest recommended if Age 77-80 years, 30 pack-year currently smoking OR have quit w/in 15years. Patient does not qualify.   AAA:  The USPSTF recommends one-time screening with ultrasonography in men ages 39 to 77 years who have ever smoked  - negative CT back in 2008 ECG:  2018  Advanced Care Planning: A voluntary discussion about advance care planning including the explanation and discussion of advance directives.  Discussed health care proxy and Living will, and the patient was able to identify a health care proxy as wife - Butch Penny.  Patient does have a living will at present time.   Patient Active Problem List   Diagnosis Date Noted  . Epididymal cyst 06/24/2016  . Anemia, iron deficiency 01/16/2016  . Benign essential HTN 12/30/2014  . Dyslipidemia 12/30/2014  . Adult hypothyroidism 12/30/2014  . Dysmetabolic syndrome 70/96/2836  . Headache, migraine 12/30/2014  . Obesity (BMI 30-39.9) 12/30/2014  . Arthritis, degenerative 12/30/2014  . Perennial allergic rhinitis with seasonal variation 12/30/2014  . 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  . Patella-femoral syndrome 12/03/2014  . Low back pain 04/03/2010  . Vitamin D deficiency 04/14/2009  . Abnormal presence of protein in urine 09/12/2007  . OP (osteoporosis) 05/05/2007    Past Surgical History:  Procedure Laterality Date  . LAMINECTOMY  1985   L-5    Family History  Problem Relation Age of Onset  . Heart disease Father   . Hypothyroidism Sister   . Thyroid disease Sister   . Hypothyroidism Sister   . Migraines Sister   . Prostate cancer Neg Hx   . Bladder Cancer Neg Hx   . Kidney cancer Neg Hx     Social History   Socioeconomic History  . Marital status: Married    Spouse name: donna  . Number of children: 0  . Years of  education: Not on file  . Highest education level: Not on file  Occupational History  . Occupation: Customer service manager  Needs  . Financial resource strain: Not hard at all  . Food insecurity:    Worry: Never true    Inability: Never true  . Transportation needs:    Medical: No    Non-medical: No  Tobacco Use  . Smoking status: Former Smoker    Packs/day: 0.50    Years: 5.00    Pack years: 2.50    Types: Cigarettes    Last attempt to quit: 10/03/1970    Years since quitting: 47.6  . Smokeless tobacco: Never Used  Substance and Sexual Activity  . Alcohol use: Yes    Alcohol/week: 4.0 standard drinks    Types: 4 Standard drinks or equivalent per week  . Drug use: No  . Sexual activity: Yes    Partners: Female    Birth control/protection: Post-menopausal  Lifestyle  . Physical activity:    Days per week: 2 days    Minutes per session: 30 min  . Stress: Not at all  Relationships  . Social connections:    Talks on phone: More than three times a week    Gets together: Once a week    Attends religious service: More than 4 times per year    Active member of club or organization: Yes    Attends meetings of clubs or organizations: More than 4 times per year    Relationship status: Married  . Intimate partner violence:    Fear of current or ex partner: No    Emotionally abused: No    Physically abused: No    Forced sexual activity: No  Other Topics Concern  . Not on file  Social History Narrative   Married since 1974, no children but they helped raise Tretha Sciara ( wife's niece), also helped raise their God-daughter Lyndee Leo     Current Outpatient Medications:  .  amLODipine-valsartan (EXFORGE) 10-320 MG tablet, Take 1 tablet by mouth daily., Disp: 90 tablet, Rfl: 1 .  Cholecalciferol (VITAMIN D) 2000 units CAPS, Take 1 capsule (2,000 Units total) by mouth daily., Disp: 30 capsule, Rfl: 0 .  Multiple Vitamins-Minerals (CENTRUM SILVER 50+MEN) TABS, Take 1 tablet by mouth  daily., Disp: , Rfl:  .  OMEGA-3 FATTY ACIDS PO, Take 1 tablet by mouth daily., Disp: , Rfl:  .  rosuvastatin (CRESTOR) 20 MG tablet, Take 1 tablet (20 mg total) by mouth daily., Disp: 90 tablet, Rfl: 1 .  Semaglutide (OZEMPIC) 1 MG/DOSE SOPN, Inject 1 mg into the skin once a week., Disp: 9 mL, Rfl: 1 .  SYNTHROID 175 MCG tablet, TAKE 1 TABLET BY MOUTH ONCE DAILY ON AN EMPTY STOMACH. WAIT 30 MINUTES BEFORE TAKING OTHER MEDS., Disp: 90 tablet, Rfl: 0 .  Testosterone 20.25 MG/ACT (1.62%) GEL, APPLY 4 PUMPS AS DIRECTED DAILY, Disp: 150 g, Rfl: 2 .  triamterene-hydrochlorothiazide (MAXZIDE-25) 37.5-25 MG tablet, Take 1 tablet by mouth daily., Disp: 90 tablet, Rfl: 1 .  Verapamil HCl CR 300 MG CP24, Take 1 capsule (300 mg total) by mouth daily., Disp: 90 each, Rfl: 1  No Known Allergies   ROS  Constitutional: Negative for fever or weight change.  Respiratory: Negative for cough and shortness of breath.   Cardiovascular: Negative for chest pain or palpitations.  Gastrointestinal: Negative for abdominal pain, no bowel changes.  Musculoskeletal: Negative for gait problem or joint swelling.  Skin: Negative for rash.  Neurological: Negative for dizziness or headache.  No other specific complaints in a complete review of systems (except as listed in HPI above).  Objective  Vitals:   05/28/18 0906  BP: 130/80  Pulse: 68  Resp: 16  Temp: 98.1 F (36.7 C)  TempSrc: Oral  SpO2: 98%  Weight: 245 lb (111.1 kg)  Height: 5\' 10"  (1.778 m)    Body mass index is 35.15 kg/m.  Physical Exam  Constitutional: Patient appears well-developed and well-nourished. Obese  No distress.  HENT: Head: Normocephalic and atraumatic. Ears: B TMs ok, no erythema or effusion; Nose: Nose normal. Mouth/Throat: Oropharynx is clear and moist. No oropharyngeal exudate.  Eyes: Conjunctivae and EOM are normal. Pupils are equal, round, and reactive to light. No scleral icterus.  Neck: Normal range of motion. Neck  supple. No JVD present. No thyromegaly present.  Cardiovascular: Normal rate, regular rhythm and normal heart sounds.  No murmur heard. Trace  BLE edema. Pulmonary/Chest: Effort normal and breath sounds normal. No respiratory distress. Abdominal: Soft. Bowel sounds are normal, no distension. There is no tenderness. no masses MALE GENITALIA: Normal descended testes bilaterally, no masses palpated, no hernias, no lesions, no discharge RECTAL: Prostate normal size and consistency, anal skin tags present Musculoskeletal: Normal range of motion, no joint effusions. No gross deformities Neurological: he is alert and oriented to person, place, and time. No cranial nerve deficit. Coordination, balance, strength, speech and gait are normal.  Skin: Skin is warm and dry. No rash noted. No erythema.  Psychiatric: Patient has a normal mood and affect. behavior is normal. Judgment and thought content normal.  PHQ2/9: Depression screen [facility] 2/9 05/28/2018 02/21/2018 06/03/2017 09/07/2016 05/30/2016  Decreased Interest 0 0 0 0 0  Down, Depressed, Hopeless 0 0 0 0 0  PHQ - 2 Score 0 0 0 0 0  Altered sleeping - 0 - - -  Tired, decreased energy - 0 - - -  Change in appetite - 0 - - -  Feeling bad or failure about yourself  - 0 - - -  Trouble concentrating - 0 - - -  Moving slowly or fidgety/restless - 0 - - -  Suicidal thoughts - 0 - - -  PHQ-9 Score - 0 - - -  Difficult doing work/chores - Not difficult at all - - -     Fall Risk: Fall Risk  05/28/2018 02/21/2018 10/18/2017 06/03/2017 09/07/2016  Falls in the past year? 0 No No No No      Functional Status Survey: Is the patient deaf or have difficulty hearing?: No Does the patient have difficulty seeing, even when wearing glasses/contacts?: No Does the patient have difficulty concentrating, remembering, or making decisions?: No Does the patient have difficulty walking or climbing stairs?: No Does the patient have difficulty dressing or bathing?:  No Does the patient have difficulty doing errands alone such as visiting a doctor's office or shopping?: No    Assessment & Plan  1. Encounter for routine history and physical exam for male  - COMPLETE METABOLIC PANEL WITH GFR - CBC with Differential/Platelet - Hemoglobin A1c - Lipid panel - PSA - TSH  2. Morbid obesity ([place])  Discussed with the patient the risk posed by an increased BMI. Discussed importance of portion control, calorie counting and at least 150 minutes of physical activity weekly. Avoid sweet beverages and drink more water. Eat at least 6 servings of fruit and vegetables daily   3. Vitamin D deficiency  Taking vitamin d otc   4. Benign essential HTN  - COMPLETE METABOLIC PANEL WITH GFR - CBC with Differential/Platelet  5. Dyslipidemia  - Lipid panel  6. Other  specified hypothyroidism  - TSH  7. Dysmetabolic syndrome  - Hemoglobin A1c  8. Hypogonadism in male  - PSA  History of osteoporosis, last bone density was normal, previously used Forteo   9. Prostate cancer screening  - PSA    -Prostate cancer screening and PSA options (with potential risks and benefits of testing vs not testing) were discussed along with recent recs/guidelines. -USPSTF grade A and B recommendations reviewed with patient; age-appropriate recommendations, preventive care, screening tests, etc discussed and encouraged; healthy living encouraged; see AVS for patient education given to patient -Discussed importance of 150 minutes of physical activity weekly, eat two servings of fish weekly, eat one serving of tree nuts ( cashews, pistachios, pecans, almonds.Marland Kitchen) every other day, eat 6 servings of fruit/vegetables daily and drink plenty of water and avoid sweet beverages.  -Reviewed Health Maintenance

## 2018-05-29 LAB — CBC WITH DIFFERENTIAL/PLATELET
BASOS ABS: 67 {cells}/uL (ref 0–200)
Basophils Relative: 0.9 %
EOS PCT: 2.3 %
Eosinophils Absolute: 170 cells/uL (ref 15–500)
HEMATOCRIT: 38.3 % — AB (ref 38.5–50.0)
HEMOGLOBIN: 13.4 g/dL (ref 13.2–17.1)
LYMPHS ABS: 1791 {cells}/uL (ref 850–3900)
MCH: 31.2 pg (ref 27.0–33.0)
MCHC: 35 g/dL (ref 32.0–36.0)
MCV: 89.1 fL (ref 80.0–100.0)
MONOS PCT: 6.5 %
MPV: 9.4 fL (ref 7.5–12.5)
NEUTROS PCT: 66.1 %
Neutro Abs: 4891 cells/uL (ref 1500–7800)
PLATELETS: 240 10*3/uL (ref 140–400)
RBC: 4.3 10*6/uL (ref 4.20–5.80)
RDW: 13.3 % (ref 11.0–15.0)
TOTAL LYMPHOCYTE: 24.2 %
WBC mixed population: 481 cells/uL (ref 200–950)
WBC: 7.4 10*3/uL (ref 3.8–10.8)

## 2018-05-29 LAB — COMPLETE METABOLIC PANEL WITH GFR
AG Ratio: 2 (calc) (ref 1.0–2.5)
ALKALINE PHOSPHATASE (APISO): 84 U/L (ref 40–115)
ALT: 36 U/L (ref 9–46)
AST: 24 U/L (ref 10–35)
Albumin: 4.7 g/dL (ref 3.6–5.1)
BUN / CREAT RATIO: 27 (calc) — AB (ref 6–22)
BUN: 31 mg/dL — AB (ref 7–25)
CALCIUM: 9.6 mg/dL (ref 8.6–10.3)
CO2: 29 mmol/L (ref 20–32)
Chloride: 105 mmol/L (ref 98–110)
Creat: 1.16 mg/dL (ref 0.70–1.25)
GFR, EST AFRICAN AMERICAN: 75 mL/min/{1.73_m2} (ref >=60)
GFR, EST NON AFRICAN AMERICAN: 64 mL/min/{1.73_m2} (ref >=60)
GLOBULIN: 2.3 g/dL (ref 1.9–3.7)
Glucose, Bld: 99 mg/dL (ref 65–99)
Potassium: 3.8 mmol/L (ref 3.5–5.3)
SODIUM: 144 mmol/L (ref 135–146)
Total Bilirubin: 0.6 mg/dL (ref 0.2–1.2)
Total Protein: 7 g/dL (ref 6.1–8.1)

## 2018-05-29 LAB — PSA: PSA: 1.9 ng/mL (ref <=4.0)

## 2018-05-29 LAB — LIPID PANEL
CHOLESTEROL: 168 mg/dL (ref <200)
HDL: 56 mg/dL (ref >40)
LDL Cholesterol (Calc): 91 mg/dL (calc)
Non-HDL Cholesterol (Calc): 112 mg/dL (calc) (ref <130)
TRIGLYCERIDES: 111 mg/dL (ref <150)
Total CHOL/HDL Ratio: 3 (calc) (ref <5.0)

## 2018-05-29 LAB — TSH: TSH: 1.23 mIU/L (ref 0.40–4.50)

## 2018-05-29 LAB — HEMOGLOBIN A1C
Hgb A1c MFr Bld: 4.7 % of total Hgb (ref <5.7)
MEAN PLASMA GLUCOSE: 88 (calc)
eAG (mmol/L): 4.9 (calc)

## 2018-06-22 ENCOUNTER — Other Ambulatory Visit: Payer: Self-pay | Admitting: Family Medicine

## 2018-06-22 DIAGNOSIS — I10 Essential (primary) hypertension: Secondary | ICD-10-CM

## 2018-06-27 ENCOUNTER — Other Ambulatory Visit: Payer: Self-pay | Admitting: Family Medicine

## 2018-06-27 DIAGNOSIS — I10 Essential (primary) hypertension: Secondary | ICD-10-CM

## 2018-07-03 ENCOUNTER — Encounter: Payer: Self-pay | Admitting: Family Medicine

## 2018-07-07 ENCOUNTER — Other Ambulatory Visit: Payer: Self-pay

## 2018-07-07 MED ORDER — VALSARTAN 320 MG PO TABS: 320.0000 mg | ORAL_TABLET | Freq: Every day | ORAL | 0 refills | Status: DC

## 2018-07-07 MED ORDER — AMLODIPINE BESYLATE 10 MG PO TABS: 10.0000 mg | ORAL_TABLET | Freq: Every day | ORAL | 0 refills | Status: DC

## 2018-07-07 NOTE — Telephone Encounter (Signed)
Patient came by to inform us there is a Producer, television/film/video of the combination of Amlodipine-Valsartan. [facility] has [facility] to [facility] and [facility] to see if anyone had the combination in stock but was unable to find it. [facility] advised patient to ask his doctor office to split the two medications and resend it a new prescription. Patient ask we send them in as 90 day supplies and to [place].

## 2018-07-09 ENCOUNTER — Telehealth: Payer: Self-pay

## 2018-07-09 NOTE — Telephone Encounter (Signed)
Prior Authorization was initiated for Synthroid and it was denied. The requested medication and or diagnosis are not a covered benefit and excluded from coverage in accordance with the term and conditions of his plan benefit.

## 2018-07-10 NOTE — Telephone Encounter (Signed)
Patient was notified and will call his HR and insurance company to see what he is able to do from his side. Also his BP is not covered at [employer] so he asked that we switch the prescription from [facility] to [facility] (Amlodipine and Valsartan).

## 2018-08-11 ENCOUNTER — Other Ambulatory Visit: Payer: Self-pay | Admitting: Family Medicine

## 2018-08-20 ENCOUNTER — Telehealth: Payer: Self-pay

## 2018-08-20 NOTE — Telephone Encounter (Signed)
Called patient insurance plan [employer]. Appeal telephone number at [phone]. The representative informed me since Synthroid is a plan exclusion we would have to write a letter of medical necessity, written documentation of him trying and failing the generic thyroid medications and why is he is unable to tolerate them versus the brand. Then fax all the documentation along with the letter toe Expedited Appeal Fax# [phone]. Patient was notified and just picked his prescription up for Synthroid that he paid cash for last week. Once he runs out he will call our office to get a refill and get the generic Synthroid for trial for insurance. Patient has always been on brand and has been taking it at least since 2016.

## 2018-08-25 ENCOUNTER — Other Ambulatory Visit: Payer: Self-pay | Admitting: Family Medicine

## 2018-08-25 ENCOUNTER — Telehealth: Payer: Self-pay | Admitting: Family Medicine

## 2018-08-25 NOTE — Telephone Encounter (Signed)
Please advise 

## 2018-08-25 NOTE — Telephone Encounter (Signed)
Copied from [place] [id number]. Topic: Quick Communication - See Telephone Encounter >> Aug 25, 2018  9:40 AM Sheran Luz wrote: CRM for notification. See Telephone encounter for: 08/25/18.  [name], Pharmacist with [facility], calling on behalf of patient stating that insurance will not currently cover SYNTHROID 175 MCG tablet and inquired if generic can be sent instead. Please advise.    [place], [place] - [place]  [phone] (Phone) [phone] (Fax)

## 2018-08-26 MED ORDER — LEVOTHYROXINE SODIUM 175 MCG PO TABS: 175.0000 ug | ORAL_TABLET | Freq: Every day | ORAL | 0 refills | Status: DC

## 2018-08-26 NOTE — Progress Notes (Signed)
Please try again

## 2018-08-27 ENCOUNTER — Ambulatory Visit: Payer: Commercial Managed Care - PPO | Admitting: Family Medicine

## 2018-09-17 ENCOUNTER — Other Ambulatory Visit: Payer: Self-pay | Admitting: Family Medicine

## 2018-09-17 DIAGNOSIS — E038 Other specified hypothyroidism: Secondary | ICD-10-CM

## 2018-09-17 LAB — TSH: TSH: 0.14 mIU/L — ABNORMAL LOW (ref 0.40–4.50)

## 2018-09-18 ENCOUNTER — Encounter: Payer: Self-pay | Admitting: Family Medicine

## 2018-09-18 ENCOUNTER — Ambulatory Visit (INDEPENDENT_AMBULATORY_CARE_PROVIDER_SITE_OTHER): Payer: Commercial Managed Care - PPO | Admitting: Family Medicine

## 2018-09-18 VITALS — Ht 70.0 in

## 2018-09-18 DIAGNOSIS — R972 Elevated prostate specific antigen [PSA]: Secondary | ICD-10-CM

## 2018-09-18 DIAGNOSIS — E291 Testicular hypofunction: Secondary | ICD-10-CM

## 2018-09-18 DIAGNOSIS — E038 Other specified hypothyroidism: Secondary | ICD-10-CM | POA: Diagnosis not present

## 2018-09-18 DIAGNOSIS — E785 Hyperlipidemia, unspecified: Secondary | ICD-10-CM

## 2018-09-18 DIAGNOSIS — E8881 Metabolic syndrome: Secondary | ICD-10-CM

## 2018-09-18 DIAGNOSIS — I10 Essential (primary) hypertension: Secondary | ICD-10-CM

## 2018-09-18 MED ORDER — VALSARTAN 320 MG PO TABS: 320.0000 mg | ORAL_TABLET | Freq: Every day | ORAL | 0 refills | Status: DC

## 2018-09-18 MED ORDER — TRIAMTERENE-HCTZ 37.5-25 MG PO TABS: 1.0000 | ORAL_TABLET | Freq: Every day | ORAL | 1 refills | Status: DC

## 2018-09-18 MED ORDER — ROSUVASTATIN CALCIUM 20 MG PO TABS: 20.0000 mg | ORAL_TABLET | Freq: Every day | ORAL | 1 refills | Status: DC

## 2018-09-18 MED ORDER — SEMAGLUTIDE (1 MG/DOSE) 2 MG/1.5ML ~~LOC~~ SOPN: 1.0000 mg | PEN_INJECTOR | SUBCUTANEOUS | 1 refills | Status: DC

## 2018-09-18 MED ORDER — AMLODIPINE BESYLATE 10 MG PO TABS: 10.0000 mg | ORAL_TABLET | Freq: Every day | ORAL | 0 refills | Status: DC

## 2018-09-18 NOTE — Progress Notes (Signed)
Name: Alan Campbell   [name]: [id number]    DOB: May 24, 1951   Date:09/18/2018       Progress Note  Subjective  Chief Complaint  Chief Complaint  Patient presents with  . Hypertension  . Hypothyroidism  . Dyslipidemia    I connected with@ on 09/18/18 at  8:00 AM EDT by a video enabled telemedicine application and verified that I am speaking with the correct person using two identifiers.  I discussed the limitations of evaluation and management by telemedicine and the availability of in person appointments. The patient expressed understanding and agreed to proceed. Staff also discussed with the patient that there may be a patient responsible charge related to this service. Patient Location: at home  Provider Location: [facility] Additional Individuals present: his wife for a short period of the visit   HPI  Hypothyroidism: he is on lower dose of synthroid, now on 175 mcg generic thyroid medication, TSH is suppressed, but just switched medication  about 3 weeks ago. No weight changes, no palpitation or heat/cold intolerance. We will continue current dose and recheck in 4-6 weeks.   Hypogonadism: he has been forgetting to use testosterone on a regular basis, he states he resumed medication about one month ago and PSA went up a little, we will recheck testosterone and PSA with next labs  Metabolic syndrome: he was on Metformin for many years, but still had insulin resistance and weight was trending up, he has been on GLP agonistssince Spring 2018. He is on higher dose of Ozempic 1 mg since Dec 2018 and has lost weight since, weight was 253 lbs and was down 11 lbs , but he does not have a good scale at home and not sure of his weight right now.   HTN: taking medication daily and denies side effects,EKG was abnormalseen by Dr. Clayborn Bigness and had stress test and echo done 07/2017, mild right ventricular enlargement otherwise normal, he occasionally snores, denies decreased in  exercise tolerance. No chest pain or palpitation   Dyslipidemia: reviewed labs . HDL improved and LDL is at goal, continue medication   Patient Active Problem List   Diagnosis Date Noted  . Epididymal cyst 06/24/2016  . Anemia, iron deficiency 01/16/2016  . Benign essential HTN 12/30/2014  . Dyslipidemia 12/30/2014  . Adult hypothyroidism 12/30/2014  . Dysmetabolic syndrome 01/74/9449  . Headache, migraine 12/30/2014  . Obesity (BMI 30-39.9) 12/30/2014  . Arthritis, degenerative 12/30/2014  . Perennial allergic rhinitis with seasonal variation 12/30/2014  . 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  . Patella-femoral syndrome 12/03/2014  . Low back pain 04/03/2010  . Vitamin D deficiency 04/14/2009  . Abnormal presence of protein in urine 09/12/2007  . OP (osteoporosis) 05/05/2007    Past Surgical History:  Procedure Laterality Date  . LAMINECTOMY  1985   L-5    Family History  Problem Relation Age of Onset  . Heart disease Father   . Hypothyroidism Sister   . Thyroid disease Sister   . Hypothyroidism Sister   . Migraines Sister   . Prostate cancer Neg Hx   . Bladder Cancer Neg Hx   . Kidney cancer Neg Hx     Social History   Socioeconomic History  . Marital status: Married    Spouse name: donna  . Number of children: 0  . Years of education: Not on file  . Highest education level: Not on file  Occupational History  . Occupation: Investment banker, corporate   Social Needs  . Financial resource strain:  Not hard at all  . Food insecurity:    Worry: Never true    Inability: Never true  . Transportation needs:    Medical: No    Non-medical: No  Tobacco Use  . Smoking status: Former Smoker    Packs/day: 0.50    Years: 5.00    Pack years: 2.50    Types: Cigarettes    Last attempt to quit: 10/03/1970    Years since quitting: 47.9  . Smokeless tobacco: Never Used  Substance and Sexual Activity  . Alcohol use: Yes    Alcohol/week: 4.0 standard drinks    Types: 4  Standard drinks or equivalent per week  . Drug use: No  . Sexual activity: Yes    Partners: Female    Birth control/protection: Post-menopausal  Lifestyle  . Physical activity:    Days per week: 2 days    Minutes per session: 30 min  . Stress: Not at all  Relationships  . Social connections:    Talks on phone: More than three times a week    Gets together: Once a week    Attends religious service: More than 4 times per year    Active member of club or organization: Yes    Attends meetings of clubs or organizations: More than 4 times per year    Relationship status: Married  . Intimate partner violence:    Fear of current or ex partner: No    Emotionally abused: No    Physically abused: No    Forced sexual activity: No  Other Topics Concern  . Not on file  Social History Narrative   Married since 1974, no children but they helped raise Tretha Sciara ( wife's niece), also helped raise their God-daughter Lyndee Leo     Current Outpatient Medications:  .  amLODipine (NORVASC) 10 MG tablet, Take 1 tablet (10 mg total) by mouth daily., Disp: 90 tablet, Rfl: 0 .  Cholecalciferol (VITAMIN D) 2000 units CAPS, Take 1 capsule (2,000 Units total) by mouth daily., Disp: 30 capsule, Rfl: 0 .  levothyroxine (SYNTHROID) 175 MCG tablet, Take 1 tablet (175 mcg total) by mouth daily before breakfast., Disp: 90 tablet, Rfl: 0 .  Multiple Vitamins-Minerals (CENTRUM SILVER 50+MEN) TABS, Take 1 tablet by mouth daily., Disp: , Rfl:  .  OMEGA-3 FATTY ACIDS PO, Take 1 tablet by mouth daily., Disp: , Rfl:  .  rosuvastatin (CRESTOR) 20 MG tablet, Take 1 tablet (20 mg total) by mouth daily., Disp: 90 tablet, Rfl: 1 .  Semaglutide (OZEMPIC) 1 MG/DOSE SOPN, Inject 1 mg into the skin once a week., Disp: 9 mL, Rfl: 1 .  Testosterone 20.25 MG/ACT (1.62%) GEL, APPLY 4 PUMPS AS DIRECTED DAILY, Disp: 150 g, Rfl: 2 .  triamterene-hydrochlorothiazide (MAXZIDE-25) 37.5-25 MG tablet, Take 1 tablet by mouth daily., Disp: 90  tablet, Rfl: 1 .  valsartan (DIOVAN) 320 MG tablet, TAKE 1 TABLET BY MOUTH EVERY DAY, Disp: 90 tablet, Rfl: 0 .  Verapamil HCl CR 300 MG CP24, TAKE 1 CAPSULE(300 MG) BY MOUTH DAILY, Disp: 90 capsule, Rfl: 1  No Known Allergies  I personally reviewed active problem list, medication list, allergies, family history, social history with the patient/caregiver today.   ROS  Constitutional: Negative for fever or weight change.  Respiratory: Negative for cough and shortness of breath.   Cardiovascular: Negative for chest pain or palpitations.  Gastrointestinal: Negative for abdominal pain, no bowel changes.  Musculoskeletal: Negative for gait problem or joint swelling.  Skin: Negative for rash.  Neurological: Negative for dizziness or headache.  No other specific complaints in a complete review of systems (except as listed in HPI above).   Objective  Virtual encounter, vitals not obtained.  Body mass index is 35.15 kg/m.  Physical Exam  Awake, alert and oriented , no distress and well groomed   Results for orders placed or performed in visit on 09/17/18 (from the past 6 hour(s))  TSH     Status: Abnormal   Collection Time: 09/17/18  3:24 PM  Result Value Ref Range   TSH 0.14 (L) 0.40 - 4.50 mIU/L     PHQ2/9: Depression screen [facility] 2/9 09/18/2018 05/28/2018 02/21/2018 06/03/2017 09/07/2016  Decreased Interest 0 0 0 0 0  Down, Depressed, Hopeless 0 0 0 0 0  PHQ - 2 Score 0 0 0 0 0  Altered sleeping 0 - 0 - -  Tired, decreased energy 0 - 0 - -  Change in appetite 0 - 0 - -  Feeling bad or failure about yourself  0 - 0 - -  Trouble concentrating 0 - 0 - -  Moving slowly or fidgety/restless 0 - 0 - -  Suicidal thoughts 0 - 0 - -  PHQ-9 Score 0 - 0 - -  Difficult doing work/chores - - Not difficult at all - -   PHQ-2/9 Result is negative.    Fall Risk: Fall Risk  05/28/2018 02/21/2018 10/18/2017 06/03/2017 09/07/2016  Falls in the past year? 0 No No No No    Assessment & Plan   1. Dyslipidemia  - rosuvastatin (CRESTOR) 20 MG tablet; Take 1 tablet (20 mg total) by mouth daily.  Dispense: 90 tablet; Refill: 1  2. Other specified hypothyroidism  - TSH  3. Benign essential HTN  - amLODipine (NORVASC) 10 MG tablet; Take 1 tablet (10 mg total) by mouth daily.  Dispense: 90 tablet; Refill: 0 - triamterene-hydrochlorothiazide (MAXZIDE-25) 37.5-25 MG tablet; Take 1 tablet by mouth daily.  Dispense: 90 tablet; Refill: 1 - valsartan (DIOVAN) 320 MG tablet; Take 1 tablet (320 mg total) by mouth daily.  Dispense: 90 tablet; Refill: 0  4. Hypogonadism in male  - Testosterone,Free and Total  5. Increased prostate specific antigen (PSA) velocity  - PSA  6. Insulin resistance syndrome  - Semaglutide, 1 MG/DOSE, (OZEMPIC, 1 MG/DOSE,) 2 MG/1.5ML SOPN; Inject 1 mg into the skin once a week.  Dispense: 9 mL; Refill: 1  I discussed the assessment and treatment plan with the patient. The patient was provided an opportunity to ask questions and all were answered. The patient agreed with the plan and demonstrated an understanding of the instructions.  The patient was advised to call back or seek an in-person evaluation if the symptoms worsen or if the condition fails to improve as anticipated.  I provided 26  minutes of non-face-to-face time during this encounter.

## 2018-11-06 LAB — PSA: PSA: 1 ng/mL (ref <=4.0)

## 2018-11-06 LAB — TSH: TSH: 0.11 mIU/L — ABNORMAL LOW (ref 0.40–4.50)

## 2018-11-06 LAB — TESTOSTERONE,TOTAL,MALES,(ADULT),IMMUNOASSAY(REFL): Testosterone: 272 ng/dL (ref 250–827)

## 2018-11-09 ENCOUNTER — Encounter: Payer: Self-pay | Admitting: Family Medicine

## 2018-11-21 ENCOUNTER — Other Ambulatory Visit: Payer: Self-pay | Admitting: Family Medicine

## 2018-11-21 NOTE — Telephone Encounter (Signed)
Refill request for thyroid medication. Levothyroxine to [facility]   Last Visit: 09/18/2018  Lab Results  Component Value Date   TSH 0.11 (L) 11/05/2018     Follow up on 12/22/2018

## 2018-12-22 ENCOUNTER — Ambulatory Visit: Payer: Commercial Managed Care - PPO | Admitting: Family Medicine

## 2018-12-23 ENCOUNTER — Other Ambulatory Visit: Payer: Self-pay | Admitting: Family Medicine

## 2018-12-23 DIAGNOSIS — I10 Essential (primary) hypertension: Secondary | ICD-10-CM

## 2018-12-28 ENCOUNTER — Other Ambulatory Visit: Payer: Self-pay | Admitting: Family Medicine

## 2018-12-28 DIAGNOSIS — I10 Essential (primary) hypertension: Secondary | ICD-10-CM

## 2019-01-17 ENCOUNTER — Other Ambulatory Visit: Payer: Self-pay | Admitting: Family Medicine

## 2019-01-17 DIAGNOSIS — E038 Other specified hypothyroidism: Secondary | ICD-10-CM

## 2019-01-19 NOTE — Telephone Encounter (Signed)
Patient notified and states he will be by tomorrow to recheck his TSH. Please sign order and I will put his lab slip upfront.

## 2019-01-21 ENCOUNTER — Other Ambulatory Visit: Payer: Self-pay | Admitting: Family Medicine

## 2019-01-21 LAB — TSH: TSH: 3.12 mIU/L (ref 0.40–4.50)

## 2019-01-21 MED ORDER — LEVOTHYROXINE SODIUM 150 MCG PO TABS: 150.0000 ug | ORAL_TABLET | Freq: Every day | ORAL | 1 refills | Status: DC

## 2019-02-02 ENCOUNTER — Ambulatory Visit: Payer: Commercial Managed Care - PPO | Admitting: Family Medicine

## 2019-02-12 ENCOUNTER — Other Ambulatory Visit: Payer: Self-pay | Admitting: Family Medicine

## 2019-02-12 DIAGNOSIS — I10 Essential (primary) hypertension: Secondary | ICD-10-CM

## 2019-02-12 NOTE — Telephone Encounter (Signed)
Requested medication (s) are due for refill today: yes Requested medication (s) are on the active medication list: yes  Last refill:  09/18/2018  Future visit scheduled: yes  Notes to clinic:  Review for refill  Requested Prescriptions  Pending Prescriptions Disp Refills   triamterene-hydrochlorothiazide (MAXZIDE-25) 37.5-25 MG tablet [Pharmacy Med Name: TRIAMTERENE-HCTZ 37.5-25 MG TAB] 90 tablet 1    Sig: TAKE 1 TABLET BY MOUTH ONCE DAILY     Cardiovascular: Diuretic Combos Passed - 02/12/2019  8:35 AM      Passed - K in normal range and within 360 days    Potassium  Date Value Ref Range Status  05/28/2018 3.8 3.5 - 5.3 mmol/L Final         Passed - Na in normal range and within 360 days    Sodium  Date Value Ref Range Status  05/28/2018 144 135 - 146 mmol/L Final  11/29/2015 146 (H) 134 - 144 mmol/L Final         Passed - Cr in normal range and within 360 days    Creat  Date Value Ref Range Status  05/28/2018 1.16 0.70 - 1.25 mg/dL Final    Comment:    For patients >74 years of age, the reference limit for Creatinine is approximately 13% higher for people identified as African-American. .          Passed - Ca in normal range and within 360 days    Calcium  Date Value Ref Range Status  05/28/2018 9.6 8.6 - 10.3 mg/dL Final         Passed - Last BP in normal range    BP Readings from Last 1 Encounters:  05/28/18 130/80         Passed - Valid encounter within last 6 months    Recent Outpatient Visits          4 months ago Dyslipidemia   [facility] [name], MD   8 months ago Encounter for routine history and physical exam for male   [facility] [name], MD   11 months ago Benign essential HTN   [facility] [name], MD   1 year ago Benign essential HTN   [facility] [name], MD   1 year ago Testicular hypofunction   [facility]  [name], MD      Future Appointments            In 1 week [name], MD [facility], [facility]

## 2019-02-25 ENCOUNTER — Ambulatory Visit: Payer: Commercial Managed Care - PPO | Admitting: Family Medicine

## 2019-02-25 ENCOUNTER — Other Ambulatory Visit: Payer: Self-pay

## 2019-02-25 ENCOUNTER — Encounter: Payer: Self-pay | Admitting: Family Medicine

## 2019-02-25 VITALS — BP 116/64 | HR 70 | Temp 97.5°F | Resp 16 | Ht 70.0 in | Wt 241.3 lb

## 2019-02-25 DIAGNOSIS — E291 Testicular hypofunction: Secondary | ICD-10-CM

## 2019-02-25 DIAGNOSIS — I10 Essential (primary) hypertension: Secondary | ICD-10-CM

## 2019-02-25 DIAGNOSIS — Z23 Encounter for immunization: Secondary | ICD-10-CM

## 2019-02-25 DIAGNOSIS — E8881 Metabolic syndrome: Secondary | ICD-10-CM

## 2019-02-25 DIAGNOSIS — E039 Hypothyroidism, unspecified: Secondary | ICD-10-CM

## 2019-02-25 DIAGNOSIS — E785 Hyperlipidemia, unspecified: Secondary | ICD-10-CM

## 2019-02-25 DIAGNOSIS — Z8739 Personal history of other diseases of the musculoskeletal system and connective tissue: Secondary | ICD-10-CM

## 2019-02-25 MED ORDER — TESTOSTERONE 20.25 MG/ACT (1.62%) TD GEL: TRANSDERMAL | 2 refills | Status: DC

## 2019-02-25 MED ORDER — ROSUVASTATIN CALCIUM 20 MG PO TABS: 20.0000 mg | ORAL_TABLET | Freq: Every day | ORAL | 1 refills | Status: DC

## 2019-02-25 MED ORDER — AMLODIPINE BESYLATE 10 MG PO TABS: 10.0000 mg | ORAL_TABLET | Freq: Every day | ORAL | 0 refills | Status: DC

## 2019-02-25 MED ORDER — OZEMPIC (1 MG/DOSE) 2 MG/1.5ML ~~LOC~~ SOPN: 1.0000 mg | PEN_INJECTOR | SUBCUTANEOUS | 1 refills | Status: DC

## 2019-02-25 MED ORDER — VALSARTAN 320 MG PO TABS: 320.0000 mg | ORAL_TABLET | Freq: Every day | ORAL | 0 refills | Status: DC

## 2019-02-25 NOTE — Progress Notes (Signed)
Name: Alan Campbell   [name]: [id number]    DOB: Nov 11, 1950   Date:02/25/2019       Progress Note  Subjective  Chief Complaint  Chief Complaint  Patient presents with  . Medication Refill  . Hypertension    Denies any symptoms  . Dyslipidemia  . Metabolic Syndrome  . Hypothyroidism  . Hypogonadism    HPI  Hypothyroidism: he is on lower dose of synthroid, currently on 175 mcg daily and skipping Sundays, we will switch to 150 mcg daily , he is doing well and will return to recheck labs. No change in bowel movements or dry skin   Hypogonadism: last PSA was normal, recheck labs before her CPE in December. Last level was at goal   Metabolic syndrome: he was on Metformin for many years, but still had insulin resistance and weight was trending up, he has been on GLP agonistssince Spring 2018. He is on higher dose of Ozempic 1 mg since Dec 2018 and has lost weight since. He is doing well on current regiment. Denies polyphagia, polydipsia or polyuria   HTN: taking medication daily and denies side effects,EKG was abnormalseen by Dr. Clayborn Bigness and had stress test and echo done 07/2017, mild right ventricular enlargement otherwise normal, he occasionally snores, denies decreased in exercise tolerance. He denies dizziness, chest pain or palpitation  Dyslipidemia: reviewed labs . HDL improved and LDL is at goal, recheck labs next visit   Patient Active Problem List   Diagnosis Date Noted  . Epididymal cyst 06/24/2016  . Anemia, iron deficiency 01/16/2016  . Benign essential HTN 12/30/2014  . Dyslipidemia 12/30/2014  . Adult hypothyroidism 12/30/2014  . Dysmetabolic syndrome [id number]  . Headache, migraine 12/30/2014  . Obesity (BMI 30-39.9) 12/30/2014  . Arthritis, degenerative 12/30/2014  . Perennial allergic rhinitis with seasonal variation 12/30/2014  . 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  . Patella-femoral syndrome 12/03/2014  . Low back pain 04/03/2010  . Vitamin D deficiency  04/14/2009  . Abnormal presence of protein in urine 09/12/2007  . OP (osteoporosis) 05/05/2007    Past Surgical History:  Procedure Laterality Date  . LAMINECTOMY  1985   L-5    Family History  Problem Relation Age of Onset  . Heart disease Father   . Hypothyroidism Sister   . Thyroid disease Sister   . Hypothyroidism Sister   . Migraines Sister   . Prostate cancer Neg Hx   . Bladder Cancer Neg Hx   . Kidney cancer Neg Hx     Social History   Socioeconomic History  . Marital status: Married    Spouse name: donna  . Number of children: 0  . Years of education: Not on file  . Highest education level: Not on file  Occupational History  . Occupation: Investment banker, corporate   Social Needs  . Financial resource strain: Not hard at all  . Food insecurity    Worry: Never true    Inability: Never true  . Transportation needs    Medical: No    Non-medical: No  Tobacco Use  . Smoking status: Former Smoker    Packs/day: 0.50    Years: 5.00    Pack years: 2.50    Types: Cigarettes    Quit date: 10/03/1970    Years since quitting: 48.4  . Smokeless tobacco: Never Used  Substance and Sexual Activity  . Alcohol use: Yes    Alcohol/week: 4.0 standard drinks    Types: 4 Standard drinks or equivalent per week  . Drug use:  No  . Sexual activity: Yes    Partners: Female    Birth control/protection: Post-menopausal  Lifestyle  . Physical activity    Days per week: 2 days    Minutes per session: 30 min  . Stress: Not at all  Relationships  . Social connections    Talks on phone: More than three times a week    Gets together: Once a week    Attends religious service: More than 4 times per year    Active member of club or organization: Yes    Attends meetings of clubs or organizations: More than 4 times per year    Relationship status: Married  . Intimate partner violence    Fear of current or ex partner: No    Emotionally abused: No    Physically abused: No    Forced  sexual activity: No  Other Topics Concern  . Not on file  Social History Narrative   Married since 1974, no children but they helped raise Tretha Sciara ( wife's niece), also helped raise their God-daughter Lyndee Leo     Current Outpatient Medications:  .  amLODipine (NORVASC) 10 MG tablet, TAKE 1 TABLET(10 MG) BY MOUTH DAILY, Disp: 90 tablet, Rfl: 0 .  Cholecalciferol (VITAMIN D) 2000 units CAPS, Take 1 capsule (2,000 Units total) by mouth daily., Disp: 30 capsule, Rfl: 0 .  levothyroxine (SYNTHROID) 150 MCG tablet, Take 1 tablet (150 mcg total) by mouth daily., Disp: 90 tablet, Rfl: 1 .  Multiple Vitamins-Minerals (CENTRUM SILVER 50+MEN) TABS, Take 1 tablet by mouth daily., Disp: , Rfl:  .  OMEGA-3 FATTY ACIDS PO, Take 1 tablet by mouth daily., Disp: , Rfl:  .  rosuvastatin (CRESTOR) 20 MG tablet, Take 1 tablet (20 mg total) by mouth daily., Disp: 90 tablet, Rfl: 1 .  Semaglutide, 1 MG/DOSE, (OZEMPIC, 1 MG/DOSE,) 2 MG/1.5ML SOPN, Inject 1 mg into the skin once a week., Disp: 9 mL, Rfl: 1 .  Testosterone 20.25 MG/ACT (1.62%) GEL, APPLY 4 PUMPS AS DIRECTED DAILY, Disp: 150 g, Rfl: 2 .  triamterene-hydrochlorothiazide (MAXZIDE-25) 37.5-25 MG tablet, TAKE 1 TABLET BY MOUTH ONCE DAILY, Disp: 90 tablet, Rfl: 1 .  valsartan (DIOVAN) 320 MG tablet, TAKE 1 TABLET(320 MG) BY MOUTH DAILY, Disp: 90 tablet, Rfl: 0 .  Verapamil HCl CR 300 MG CP24, TAKE 1 CAPSULE(300 MG) BY MOUTH DAILY, Disp: 90 capsule, Rfl: 1  No Known Allergies  I personally reviewed active problem list, medication list, allergies, family history, social history, health maintenance with the patient/caregiver today.   ROS  Constitutional: Negative for fever, positive for mild  weight change.  Respiratory: Negative for cough and shortness of breath.   Cardiovascular: Negative for chest pain or palpitations.  Gastrointestinal: Negative for abdominal pain, no bowel changes.  Musculoskeletal: Negative for gait problem or joint swelling.   Skin: Negative for rash.  Neurological: Negative for dizziness or headache.  No other specific complaints in a complete review of systems (except as listed in HPI above).   Objective  Vitals:   02/25/19 1102  BP: 116/64  Pulse: 70  Resp: 16  Temp: (!) 97.5 F (36.4 C)  TempSrc: Temporal  SpO2: 98%  Weight: 241 lb 4.8 oz (109.5 kg)  Height: 5\' 10"  (1.778 m)    Body mass index is 34.62 kg/m.  Physical Exam  Constitutional: Patient appears well-developed and well-nourished. Obese No distress.  HEENT: head atraumatic, normocephalic, pupils equal and reactive to light Cardiovascular: Normal rate, regular rhythm and normal heart sounds.  No murmur heard. No BLE edema. Pulmonary/Chest: Effort normal and breath sounds normal. No respiratory distress. Abdominal: Soft.  There is no tenderness. Psychiatric: Patient has a normal mood and affect. behavior is normal. Judgment and thought content normal. Muscular Skeletal: no crepitus with extension of both knees  Recent Results (from the past 2160 hour(s))  TSH     Status: None   Collection Time: 01/20/19  8:43 AM  Result Value Ref Range   TSH 3.12 0.40 - 4.50 mIU/L     PHQ2/9: Depression screen [facility] 2/9 02/25/2019 09/18/2018 05/28/2018 02/21/2018 06/03/2017  Decreased Interest 0 0 0 0 0  Down, Depressed, Hopeless 0 0 0 0 0  PHQ - 2 Score 0 0 0 0 0  Altered sleeping 0 0 - 0 -  Tired, decreased energy 0 0 - 0 -  Change in appetite 0 0 - 0 -  Feeling bad or failure about yourself  0 0 - 0 -  Trouble concentrating 0 0 - 0 -  Moving slowly or fidgety/restless 0 0 - 0 -  Suicidal thoughts 0 0 - 0 -  PHQ-9 Score 0 0 - 0 -  Difficult doing work/chores Not difficult at all - - Not difficult at all -    phq 9 is negative   Fall Risk: Fall Risk  02/25/2019 05/28/2018 02/21/2018 10/18/2017 06/03/2017  Falls in the past year? 0 0 No No No  Number falls in past yr: 0 - - - -  Injury with Fall? 0 - - - -    Functional Status Survey: Is  the patient deaf or have difficulty hearing?: No Does the patient have difficulty seeing, even when wearing glasses/contacts?: Yes Does the patient have difficulty concentrating, remembering, or making decisions?: No Does the patient have difficulty walking or climbing stairs?: No Does the patient have difficulty dressing or bathing?: No Does the patient have difficulty doing errands alone such as visiting a doctor's office or shopping?: No   Assessment & Plan  1. Benign essential HTN  - amLODipine (NORVASC) 10 MG tablet; Take 1 tablet (10 mg total) by mouth daily.  Dispense: 90 tablet; Refill: 0 - valsartan (DIOVAN) 320 MG tablet; Take 1 tablet (320 mg total) by mouth daily.  Dispense: 90 tablet; Refill: 0 - COMPLETE METABOLIC PANEL WITH GFR - CBC with Differential/Platelet  2. Needs flu shot  - Flu vaccine HIGH DOSE PF (Fluzone High dose)  3. Hypothyroidism, adult  - TSH  4. Hypogonadism in male  - Testosterone 20.25 MG/ACT (1.62%) GEL; APPLY 4 PUMPS AS DIRECTED DAILY  Dispense: 150 g; Refill: 2  5. Dysmetabolic syndrome  - Hemoglobin A1c  6. History of osteoporosis   7. Dyslipidemia  - rosuvastatin (CRESTOR) 20 MG tablet; Take 1 tablet (20 mg total) by mouth daily.  Dispense: 90 tablet; Refill: 1 - Lipid panel  8. Insulin resistance syndrome  - Semaglutide, 1 MG/DOSE, (OZEMPIC, 1 MG/DOSE,) 2 MG/1.5ML SOPN; Inject 1 mg into the skin once a week.  Dispense: 9 mL; Refill: 1

## 2019-03-31 ENCOUNTER — Other Ambulatory Visit: Payer: Self-pay | Admitting: Family Medicine

## 2019-03-31 DIAGNOSIS — I10 Essential (primary) hypertension: Secondary | ICD-10-CM

## 2019-04-10 ENCOUNTER — Other Ambulatory Visit: Payer: Self-pay

## 2019-04-10 ENCOUNTER — Ambulatory Visit (INDEPENDENT_AMBULATORY_CARE_PROVIDER_SITE_OTHER): Payer: Commercial Managed Care - PPO | Admitting: Family Medicine

## 2019-04-10 ENCOUNTER — Encounter: Payer: Self-pay | Admitting: Family Medicine

## 2019-04-10 DIAGNOSIS — J209 Acute bronchitis, unspecified: Secondary | ICD-10-CM

## 2019-04-10 MED ORDER — BREO ELLIPTA 100-25 MCG/INH IN AEPB: 1.0000 | INHALATION_SPRAY | Freq: Every day | RESPIRATORY_TRACT | 0 refills | Status: DC

## 2019-04-10 NOTE — Progress Notes (Signed)
Name: Alan Campbell   [name]: [id number]    DOB: 1950/08/09   Date:04/10/2019       Progress Note  Subjective  Chief Complaint  Chief Complaint  Patient presents with  . Nasal Congestion    Onset- Monday a week ago, symptoms are gradually improving  . Sore Throat  . Sinus Problem  . Wheezing    worst in the mornings     I connected with  Deotis Boodram Windle  on 04/10/19 at  1:20 PM EDT by a video enabled telemedicine application and verified that I am speaking with the correct person using two identifiers.  I discussed the limitations of evaluation and management by telemedicine and the availability of in person appointments. The patient expressed understanding and agreed to proceed. Staff also discussed with the patient that there may be a patient responsible charge related to this service. Patient Location: at home  Provider Location: [facility]   HPI  Bronchitis: he states symptoms started last week. His wife is a NP and had a cold, she tested negative for COVID-19. He states initially had nasal congestion, sore throat, post-nasal drainage. Most of the symptoms have improved, but continues to have a cough that is worse in am and at night, some sputum that is pale in color and has intermittent wheezing. He denies fever, chills or change in appetite.    Patient Active Problem List   Diagnosis Date Noted  . Epididymal cyst 06/24/2016  . Anemia, iron deficiency 01/16/2016  . Benign essential HTN 12/30/2014  . Dyslipidemia 12/30/2014  . Adult hypothyroidism 12/30/2014  . Dysmetabolic syndrome 123456  . Headache, migraine 12/30/2014  . Obesity (BMI 30-39.9) 12/30/2014  . Arthritis, degenerative 12/30/2014  . Perennial allergic rhinitis with seasonal variation 12/30/2014  . 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  . Patella-femoral syndrome 12/03/2014  . Low back pain 04/03/2010  . Vitamin D deficiency 04/14/2009  . Abnormal presence of protein in urine 09/12/2007  .  OP (osteoporosis) 05/05/2007    Past Surgical History:  Procedure Laterality Date  . LAMINECTOMY  1985   L-5    Family History  Problem Relation Age of Onset  . Heart disease Father   . Hypothyroidism Sister   . Thyroid disease Sister   . Hypothyroidism Sister   . Migraines Sister   . Prostate cancer Neg Hx   . Bladder Cancer Neg Hx   . Kidney cancer Neg Hx     Social History   Socioeconomic History  . Marital status: Married    Spouse name: donna  . Number of children: 0  . Years of education: Not on file  . Highest education level: Not on file  Occupational History  . Occupation: Investment banker, corporate   Social Needs  . Financial resource strain: Not hard at all  . Food insecurity    Worry: Never true    Inability: Never true  . Transportation needs    Medical: No    Non-medical: No  Tobacco Use  . Smoking status: Former Smoker    Packs/day: 0.50    Years: 5.00    Pack years: 2.50    Types: Cigarettes    Quit date: 10/03/1970    Years since quitting: 48.5  . Smokeless tobacco: Never Used  Substance and Sexual Activity  . Alcohol use: Yes    Alcohol/week: 4.0 standard drinks    Types: 4 Standard drinks or equivalent per week  . Drug use: No  . Sexual activity: Yes  Partners: Female    Birth control/protection: Post-menopausal  Lifestyle  . Physical activity    Days per week: 2 days    Minutes per session: 30 min  . Stress: Not at all  Relationships  . Social connections    Talks on phone: More than three times a week    Gets together: Once a week    Attends religious service: More than 4 times per year    Active member of club or organization: Yes    Attends meetings of clubs or organizations: More than 4 times per year    Relationship status: Married  . Intimate partner violence    Fear of current or ex partner: No    Emotionally abused: No    Physically abused: No    Forced sexual activity: No  Other Topics Concern  . Not on file  Social  History Narrative   Married since 1974, no children but they helped raise Tretha Sciara ( wife's niece), also helped raise their God-daughter Lyndee Leo     Current Outpatient Medications:  .  amLODipine (NORVASC) 10 MG tablet, Take 1 tablet (10 mg total) by mouth daily., Disp: 90 tablet, Rfl: 0 .  Cholecalciferol (VITAMIN D) 2000 units CAPS, Take 1 capsule (2,000 Units total) by mouth daily., Disp: 30 capsule, Rfl: 0 .  levothyroxine (SYNTHROID) 150 MCG tablet, Take 1 tablet (150 mcg total) by mouth daily., Disp: 90 tablet, Rfl: 1 .  Multiple Vitamins-Minerals (CENTRUM SILVER 50+MEN) TABS, Take 1 tablet by mouth daily., Disp: , Rfl:  .  OMEGA-3 FATTY ACIDS PO, Take 1 tablet by mouth daily., Disp: , Rfl:  .  rosuvastatin (CRESTOR) 20 MG tablet, Take 1 tablet (20 mg total) by mouth daily., Disp: 90 tablet, Rfl: 1 .  Semaglutide, 1 MG/DOSE, (OZEMPIC, 1 MG/DOSE,) 2 MG/1.5ML SOPN, Inject 1 mg into the skin once a week., Disp: 9 mL, Rfl: 1 .  Testosterone 20.25 MG/ACT (1.62%) GEL, APPLY 4 PUMPS AS DIRECTED DAILY, Disp: 150 g, Rfl: 2 .  triamterene-hydrochlorothiazide (MAXZIDE-25) 37.5-25 MG tablet, TAKE 1 TABLET BY MOUTH ONCE DAILY, Disp: 90 tablet, Rfl: 1 .  valsartan (DIOVAN) 320 MG tablet, TAKE 1 TABLET(320 MG) BY MOUTH DAILY, Disp: 90 tablet, Rfl: 0 .  Verapamil HCl CR 300 MG CP24, TAKE 1 CAPSULE(300 MG) BY MOUTH DAILY, Disp: 90 capsule, Rfl: 1  No Known Allergies  I personally reviewed active problem list, medication list, allergies, family history, social history with the patient/caregiver today.   ROS  Ten systems reviewed and is negative except as mentioned in HPI   Objective  Virtual encounter, vitals not obtained.  There is no height or weight on file to calculate BMI.  Physical Exam  Awake, alert and oriented  PHQ2/9: Depression screen [facility] 2/9 04/10/2019 02/25/2019 09/18/2018 05/28/2018 02/21/2018  Decreased Interest 0 0 0 0 0  Down, Depressed, Hopeless 0 0 0 0 0  PHQ - 2 Score 0 0 0 0  0  Altered sleeping 0 0 0 - 0  Tired, decreased energy 0 0 0 - 0  Change in appetite 0 0 0 - 0  Feeling bad or failure about yourself  0 0 0 - 0  Trouble concentrating 0 0 0 - 0  Moving slowly or fidgety/restless 0 0 0 - 0  Suicidal thoughts 0 0 0 - 0  PHQ-9 Score 0 0 0 - 0  Difficult doing work/chores Not difficult at all Not difficult at all - - Not difficult at all  PHQ-2/9 Result is negative.    Fall Risk: Fall Risk  04/10/2019 02/25/2019 05/28/2018 02/21/2018 10/18/2017  Falls in the past year? 0 0 0 No No  Number falls in past yr: 0 0 - - -  Injury with Fall? 0 0 - - -     Assessment & Plan  1. Acute bronchitis, unspecified organism  - fluticasone furoate-vilanterol (BREO ELLIPTA) 100-25 MCG/INH AEPB; Inhale 1 puff into the lungs daily.  Dispense: 60 each; Refill: 0 Advised mucinex, fluids and rest, call back if no improvement  I discussed the assessment and treatment plan with the patient. The patient was provided an opportunity to ask questions and all were answered. The patient agreed with the plan and demonstrated an understanding of the instructions.  The patient was advised to call back or seek an in-person evaluation if the symptoms worsen or if the condition fails to improve as anticipated.  I provided 15  minutes of non-face-to-face time during this encounter.

## 2019-05-07 ENCOUNTER — Other Ambulatory Visit: Payer: Self-pay | Admitting: Family Medicine

## 2019-05-07 DIAGNOSIS — J209 Acute bronchitis, unspecified: Secondary | ICD-10-CM

## 2019-06-02 ENCOUNTER — Ambulatory Visit (INDEPENDENT_AMBULATORY_CARE_PROVIDER_SITE_OTHER): Payer: Commercial Managed Care - PPO | Admitting: Family Medicine

## 2019-06-02 ENCOUNTER — Other Ambulatory Visit: Payer: Self-pay

## 2019-06-02 ENCOUNTER — Encounter: Payer: Self-pay | Admitting: Family Medicine

## 2019-06-02 VITALS — BP 130/70 | HR 60 | Temp 97.1°F | Resp 16 | Ht 70.0 in | Wt 242.2 lb

## 2019-06-02 DIAGNOSIS — Z0000 Encounter for general adult medical examination without abnormal findings: Secondary | ICD-10-CM | POA: Diagnosis not present

## 2019-06-02 DIAGNOSIS — Z23 Encounter for immunization: Secondary | ICD-10-CM | POA: Diagnosis not present

## 2019-06-02 NOTE — Progress Notes (Signed)
Name: Alan Campbell   [name]: [id number]    DOB: September 25, 1950   Date:06/02/2019       Progress Note  Subjective  Chief Complaint  Chief Complaint  Patient presents with  . Annual Exam    HPI  Patient presents for annual CPE.  USPSTF grade A and B recommendations:  Diet: he is packing lunch for work, avoiding fast food  Exercise: he will retire in January and will try to exercise more often   Depression: phq 9 is negative Depression screen [facility] 2/9 06/02/2019 04/10/2019 02/25/2019 09/18/2018 05/28/2018  Decreased Interest 0 0 0 0 0  Down, Depressed, Hopeless 0 0 0 0 0  PHQ - 2 Score 0 0 0 0 0  Altered sleeping 0 0 0 0 -  Tired, decreased energy 0 0 0 0 -  Change in appetite 0 0 0 0 -  Feeling bad or failure about yourself  0 0 0 0 -  Trouble concentrating 0 0 0 0 -  Moving slowly or fidgety/restless 0 0 0 0 -  Suicidal thoughts 0 0 0 0 -  PHQ-9 Score 0 0 0 0 -  Difficult doing work/chores - Not difficult at all Not difficult at all - -    Hypertension:  BP Readings from Last 3 Encounters:  06/02/19 130/70  02/25/19 116/64  05/28/18 130/80    Obesity: Wt Readings from Last 3 Encounters:  06/02/19 242 lb 3.2 oz (109.9 kg)  02/25/19 241 lb 4.8 oz (109.5 kg)  05/28/18 245 lb (111.1 kg)   BMI Readings from Last 3 Encounters:  06/02/19 34.75 kg/m  02/25/19 34.62 kg/m  09/18/18 35.15 kg/m     Lipids:  Lab Results  Component Value Date   CHOL 168 05/28/2018   CHOL 150 10/18/2017   CHOL 157 03/18/2017   Lab Results  Component Value Date   HDL 56 05/28/2018   HDL 43 10/18/2017   HDL 51 03/18/2017   Lab Results  Component Value Date   LDLCALC 91 05/28/2018   LDLCALC 89 10/18/2017   LDLCALC 84 03/18/2017   Lab Results  Component Value Date   TRIG 111 05/28/2018   TRIG 86 10/18/2017   TRIG 120 03/18/2017   Lab Results  Component Value Date   CHOLHDL 3.0 05/28/2018   CHOLHDL 3.5 10/18/2017   CHOLHDL 3.1 03/18/2017   No results found for:  LDLDIRECT Glucose:  Glucose, Bld  Date Value Ref Range Status  05/28/2018 99 65 - 99 mg/dL Final    Comment:    .            Fasting reference interval .   10/18/2017 102 (H) 65 - 99 mg/dL Final    Comment:    .            Fasting reference interval . For someone without known diabetes, a glucose value between 100 and 125 mg/dL is consistent with prediabetes and should be confirmed with a follow-up test. .   03/18/2017 99 65 - 99 mg/dL Final    Comment:    .            Fasting reference interval .       [facility] Visit from 06/02/2019 in [facility]  AUDIT-C Score  4       Married STD testing and prevention (HIV/chl/gon/syphilis): Not interested  Hep C: negative   Skin cancer: Discussed monitoring for atypical lesions Colorectal cancer: repeat in 2023  Prostate cancer: up  to date  Lab Results  Component Value Date   PSA 1.0 11/05/2018   PSA 1.9 05/28/2018   PSA 1.3 06/03/2017    IPSS Questionnaire (AUA-7): Over the past month.   1)  How often have you had a sensation of not emptying your bladder completely after you finish urinating?  0 - Not at all  2)  How often have you had to urinate again less than two hours after you finished urinating? 1 - Less than 1 time in 5  3)  How often have you found you stopped and started again several times when you urinated?  0 - Not at all  4) How difficult have you found it to postpone urination?  0 - Not at all  5) How often have you had a weak urinary stream?  0 - Not at all  6) How often have you had to push or strain to begin urination?  0 - Not at all  7) How many times did you most typically get up to urinate from the time you went to bed until the time you got up in the morning?  1 - 1 time  Total score:  0-7 mildly symptomatic   8-19 moderately symptomatic   20-35 severely symptomatic    Lung cancer:   Low Dose CT Chest recommended if Age 65-80 years, 30 pack-year currently smoking OR have  quit w/in 15years. Patient does not qualify.   AAA: The USPSTF recommends one-time screening with ultrasonography in men ages 45 to 43 years who have ever smoked - discussed with patient and he will wait until next year  ECG:  05/2017  Advanced Care Planning: A voluntary discussion about advance care planning including the explanation and discussion of advance directives.  Discussed health care proxy and Living will, and the patient was able to identify a health care proxy as wife.  Patient does have a living will at present time. If patient does have living will, I have requested they bring this to the clinic to be scanned in to their chart.  Patient Active Problem List   Diagnosis Date Noted  . Epididymal cyst 06/24/2016  . Anemia, iron deficiency 01/16/2016  . Benign essential HTN 12/30/2014  . Dyslipidemia 12/30/2014  . Adult hypothyroidism 12/30/2014  . Dysmetabolic syndrome 123456  . Obesity (BMI 30-39.9) 12/30/2014  . Arthritis, degenerative 12/30/2014  . Perennial allergic rhinitis with seasonal variation 12/30/2014  . 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  . Patella-femoral syndrome 12/03/2014  . Low back pain 04/03/2010  . Vitamin D deficiency 04/14/2009  . Abnormal presence of protein in urine 09/12/2007  . OP (osteoporosis) 05/05/2007    Past Surgical History:  Procedure Laterality Date  . EYE SURGERY  2016  . LAMINECTOMY  1985   L-5  . SPINE SURGERY  1985    Family History  Problem Relation Age of Onset  . Heart disease Father   . Early death Father   . Hypothyroidism Sister   . Thyroid disease Sister   . Hypothyroidism Sister   . Migraines Sister   . Prostate cancer Neg Hx   . Bladder Cancer Neg Hx   . Kidney cancer Neg Hx     Social History   Socioeconomic History  . Marital status: Married    Spouse name: donna  . Number of children: 0  . Years of education: Not on file  . Highest education level: Not on file  Occupational History  . Occupation:  Investment banker, corporate   Tobacco  Use  . Smoking status: Former Smoker    Packs/day: 0.50    Years: 5.00    Pack years: 2.50    Types: Cigarettes    Quit date: 10/03/1970    Years since quitting: 48.6  . Smokeless tobacco: Never Used  . Tobacco comment: Quit 1972  Substance and Sexual Activity  . Alcohol use: Yes    Alcohol/week: 4.0 standard drinks    Types: 4 Standard drinks or equivalent per week  . Drug use: No  . Sexual activity: Yes    Partners: Female    Birth control/protection: Post-menopausal  Other Topics Concern  . Not on file  Social History Narrative   Married since 1974, no children but they helped raise Tretha Sciara ( wife's niece), also helped raise their God-daughter Lyndee Leo   He is planning on retiring January 2021   Social Determinants of Health   Financial Resource Strain: Low Risk   . Difficulty of Paying Living Expenses: Not hard at all  Food Insecurity: No Food Insecurity  . Worried About Charity fundraiser in the Last Year: Never true  . Ran Out of Food in the Last Year: Never true  Transportation Needs: No Transportation Needs  . Lack of Transportation (Medical): No  . Lack of Transportation (Non-Medical): No  Physical Activity: Insufficiently Active  . Days of Exercise per Week: 1 day  . Minutes of Exercise per Session: 40 min  Stress: No Stress Concern Present  . Feeling of Stress : Not at all  Social Connections: Not Isolated  . Frequency of Communication with Friends and Family: More than three times a week  . Frequency of Social Gatherings with Friends and Family: More than three times a week  . Attends Religious Services: More than 4 times per year  . Active Member of Clubs or Organizations: Yes  . Attends Archivist Meetings: More than 4 times per year  . Marital Status: Married  Human resources officer Violence: Not At Risk  . Fear of Current or Ex-Partner: No  . Emotionally Abused: No  . Physically Abused: No  . Sexually Abused: No      Current Outpatient Medications:  .  amLODipine (NORVASC) 10 MG tablet, Take 1 tablet (10 mg total) by mouth daily., Disp: 90 tablet, Rfl: 0 .  Cholecalciferol (VITAMIN D) 2000 units CAPS, Take 1 capsule (2,000 Units total) by mouth daily., Disp: 30 capsule, Rfl: 0 .  levothyroxine (SYNTHROID) 150 MCG tablet, Take 1 tablet (150 mcg total) by mouth daily., Disp: 90 tablet, Rfl: 1 .  Multiple Vitamins-Minerals (CENTRUM SILVER 50+MEN) TABS, Take 1 tablet by mouth daily., Disp: , Rfl:  .  OMEGA-3 FATTY ACIDS PO, Take 1 tablet by mouth daily., Disp: , Rfl:  .  rosuvastatin (CRESTOR) 20 MG tablet, Take 1 tablet (20 mg total) by mouth daily., Disp: 90 tablet, Rfl: 1 .  Semaglutide, 1 MG/DOSE, (OZEMPIC, 1 MG/DOSE,) 2 MG/1.5ML SOPN, Inject 1 mg into the skin once a week., Disp: 9 mL, Rfl: 1 .  Testosterone 20.25 MG/ACT (1.62%) GEL, APPLY 4 PUMPS AS DIRECTED DAILY, Disp: 150 g, Rfl: 2 .  triamterene-hydrochlorothiazide (MAXZIDE-25) 37.5-25 MG tablet, TAKE 1 TABLET BY MOUTH ONCE DAILY, Disp: 90 tablet, Rfl: 1 .  valsartan (DIOVAN) 320 MG tablet, TAKE 1 TABLET(320 MG) BY MOUTH DAILY, Disp: 90 tablet, Rfl: 0 .  Verapamil HCl CR 300 MG CP24, TAKE 1 CAPSULE(300 MG) BY MOUTH DAILY, Disp: 90 capsule, Rfl: 1  No Known Allergies   ROS  Constitutional: Negative for fever or weight change.  Respiratory: Negative for cough and shortness of breath.   Cardiovascular: Negative for chest pain or palpitations.  Gastrointestinal: Negative for abdominal pain, no bowel changes.  Musculoskeletal: Negative for gait problem or joint swelling.  Skin: Negative for rash.  Neurological: Negative for dizziness or headache.  No other specific complaints in a complete review of systems (except as listed in HPI above).  Objective  Vitals:   06/02/19 0838  BP: 130/70  Pulse: 60  Resp: 16  Temp: (!) 97.1 F (36.2 C)  TempSrc: Temporal  SpO2: 97%  Weight: 242 lb 3.2 oz (109.9 kg)  Height: 5\' 10"  (1.778 m)     Body mass index is 34.75 kg/m.  Physical Exam  Constitutional: Patient appears well-developed and well-nourished. No distress.  HENT: Head: Normocephalic and atraumatic. Ears: B TMs ok, no erythema or effusion; Nose: Nose normal. Mouth/Throat: Oropharynx is clear and moist. No oropharyngeal exudate.  Eyes: Conjunctivae and EOM are normal. Pupils are equal, round, and reactive to light. No scleral icterus.  Neck: Normal range of motion. Neck supple. No JVD present. No thyromegaly present.  Cardiovascular: Normal rate, regular rhythm and normal heart sounds.  No murmur heard. No BLE edema. Pulmonary/Chest: Effort normal and breath sounds normal. No respiratory distress. Abdominal: Soft. Bowel sounds are normal, no distension. There is no tenderness. no masses MALE GENITALIA: testes on inguinal canal , no masses RECTAL: Prostate normal size and consistency, no rectal masses or hemorrhoids , skin tags present on anal area  Musculoskeletal: decrease rom of right knee with small effusion. Neurological: he is alert and oriented to person, place, and time. No cranial nerve deficit. Coordination, balance, strength, speech and gait are normal.  Skin: Skin is warm and dry. No rash noted. No erythema.  Psychiatric: Patient has a normal mood and affect. behavior is normal. Judgment and thought content normal.   PHQ2/9: Depression screen [facility] 2/9 06/02/2019 04/10/2019 02/25/2019 09/18/2018 05/28/2018  Decreased Interest 0 0 0 0 0  Down, Depressed, Hopeless 0 0 0 0 0  PHQ - 2 Score 0 0 0 0 0  Altered sleeping 0 0 0 0 -  Tired, decreased energy 0 0 0 0 -  Change in appetite 0 0 0 0 -  Feeling bad or failure about yourself  0 0 0 0 -  Trouble concentrating 0 0 0 0 -  Moving slowly or fidgety/restless 0 0 0 0 -  Suicidal thoughts 0 0 0 0 -  PHQ-9 Score 0 0 0 0 -  Difficult doing work/chores - Not difficult at all Not difficult at all - -     Fall Risk: Fall Risk  06/02/2019 04/10/2019 02/25/2019  05/28/2018 02/21/2018  Falls in the past year? 0 0 0 0 No  Number falls in past yr: 0 0 0 - -  Injury with Fall? 0 0 0 - -      Functional Status Survey: Is the patient deaf or have difficulty hearing?: No Does the patient have difficulty seeing, even when wearing glasses/contacts?: No Does the patient have difficulty concentrating, remembering, or making decisions?: No Does the patient have difficulty walking or climbing stairs?: No Does the patient have difficulty dressing or bathing?: No Does the patient have difficulty doing errands alone such as visiting a doctor's office or shopping?: No    Assessment & Plan  1. Need for shingles vaccine  - Shingrix  2. Well adult exam  - Shingrix  3. Need  for Tdap vaccination  - Tdap vaccine greater than or equal to 7yo IM  -Prostate cancer screening and PSA options (with potential risks and benefits of testing vs not testing) were discussed along with recent recs/guidelines. -USPSTF grade A and B recommendations reviewed with patient; age-appropriate recommendations, preventive care, screening tests, etc discussed and encouraged; healthy living encouraged; see AVS for patient education given to patient -Discussed importance of 150 minutes of physical activity weekly, eat two servings of fish weekly, eat one serving of tree nuts ( cashews, pistachios, pecans, almonds.Marland Kitchen) every other day, eat 6 servings of fruit/vegetables daily and drink plenty of water and avoid sweet beverages.

## 2019-06-02 NOTE — Patient Instructions (Signed)

## 2019-06-03 ENCOUNTER — Other Ambulatory Visit: Payer: Self-pay | Admitting: Family Medicine

## 2019-06-03 DIAGNOSIS — I10 Essential (primary) hypertension: Secondary | ICD-10-CM

## 2019-06-03 LAB — CBC WITH DIFFERENTIAL/PLATELET
Absolute Monocytes: 360 cells/uL (ref 200–950)
Basophils Absolute: 50 cells/uL (ref 0–200)
Basophils Relative: 0.7 %
Eosinophils Absolute: 187 cells/uL (ref 15–500)
Eosinophils Relative: 2.6 %
HCT: 39 % (ref 38.5–50.0)
Hemoglobin: 13.8 g/dL (ref 13.2–17.1)
Lymphs Abs: 1634 cells/uL (ref 850–3900)
MCH: 32.1 pg (ref 27.0–33.0)
MCHC: 35.4 g/dL (ref 32.0–36.0)
MCV: 90.7 fL (ref 80.0–100.0)
MPV: 9.3 fL (ref 7.5–12.5)
Monocytes Relative: 5 %
Neutro Abs: 4968 cells/uL (ref 1500–7800)
Neutrophils Relative %: 69 %
Platelets: 218 10*3/uL (ref 140–400)
RBC: 4.3 10*6/uL (ref 4.20–5.80)
RDW: 13.1 % (ref 11.0–15.0)
Total Lymphocyte: 22.7 %
WBC: 7.2 10*3/uL (ref 3.8–10.8)

## 2019-06-03 LAB — COMPLETE METABOLIC PANEL WITH GFR
AG Ratio: 2 (calc) (ref 1.0–2.5)
ALT: 40 U/L (ref 9–46)
AST: 25 U/L (ref 10–35)
Albumin: 4.7 g/dL (ref 3.6–5.1)
Alkaline phosphatase (APISO): 73 U/L (ref 35–144)
BUN/Creatinine Ratio: 25 (calc) — ABNORMAL HIGH (ref 6–22)
BUN: 30 mg/dL — ABNORMAL HIGH (ref 7–25)
CO2: 28 mmol/L (ref 20–32)
Calcium: 10 mg/dL (ref 8.6–10.3)
Chloride: 105 mmol/L (ref 98–110)
Creat: 1.2 mg/dL (ref 0.70–1.25)
GFR, Est African American: 71 mL/min/{1.73_m2} (ref >=60)
GFR, Est Non African American: 61 mL/min/{1.73_m2} (ref >=60)
Globulin: 2.3 g/dL (calc) (ref 1.9–3.7)
Glucose, Bld: 102 mg/dL — ABNORMAL HIGH (ref 65–99)
Potassium: 4.2 mmol/L (ref 3.5–5.3)
Sodium: 141 mmol/L (ref 135–146)
Total Bilirubin: 0.8 mg/dL (ref 0.2–1.2)
Total Protein: 7 g/dL (ref 6.1–8.1)

## 2019-06-03 LAB — HEMOGLOBIN A1C
Hgb A1c MFr Bld: 4.6 % of total Hgb (ref <5.7)
Mean Plasma Glucose: 85 (calc)
eAG (mmol/L): 4.7 (calc)

## 2019-06-03 LAB — LIPID PANEL
Cholesterol: 160 mg/dL (ref <200)
HDL: 48 mg/dL (ref >=40)
LDL Cholesterol (Calc): 91 mg/dL (calc)
Non-HDL Cholesterol (Calc): 112 mg/dL (calc) (ref <130)
Total CHOL/HDL Ratio: 3.3 (calc) (ref <5.0)
Triglycerides: 111 mg/dL (ref <150)

## 2019-06-03 LAB — TSH: TSH: 4.58 mIU/L — ABNORMAL HIGH (ref 0.40–4.50)

## 2019-06-06 ENCOUNTER — Other Ambulatory Visit: Payer: Self-pay | Admitting: Family Medicine

## 2019-06-06 DIAGNOSIS — J209 Acute bronchitis, unspecified: Secondary | ICD-10-CM

## 2019-06-08 ENCOUNTER — Telehealth: Payer: Self-pay

## 2019-06-08 NOTE — Telephone Encounter (Signed)
Copied from [place] [id number]. Topic: General - Other >> Jun 08, 2019 10:26 AM Celene Kras wrote: Reason for CRM: Pt called stating his insurance only covered when he has his tetanus shot done through the office. Pt is requesting to be called when some become available. Please advise. >> Jun 08, 2019  1:06 PM Samson Frederic wrote: Does he need an OV appt for this or can this be a nurse visit only    Patient will come in on Wednesday morning for TDAP.

## 2019-06-10 ENCOUNTER — Ambulatory Visit (INDEPENDENT_AMBULATORY_CARE_PROVIDER_SITE_OTHER): Payer: Commercial Managed Care - PPO

## 2019-06-10 ENCOUNTER — Other Ambulatory Visit: Payer: Self-pay

## 2019-06-10 DIAGNOSIS — Z23 Encounter for immunization: Secondary | ICD-10-CM

## 2019-06-17 ENCOUNTER — Ambulatory Visit: Payer: Commercial Managed Care - PPO

## 2019-06-22 ENCOUNTER — Other Ambulatory Visit: Payer: Self-pay

## 2019-06-22 ENCOUNTER — Ambulatory Visit: Payer: Medicare Other

## 2019-06-24 ENCOUNTER — Other Ambulatory Visit: Payer: Self-pay | Admitting: Family Medicine

## 2019-06-24 DIAGNOSIS — I10 Essential (primary) hypertension: Secondary | ICD-10-CM

## 2019-06-25 ENCOUNTER — Telehealth: Payer: Self-pay | Admitting: Family Medicine

## 2019-06-25 NOTE — Telephone Encounter (Signed)
PER PATIENT wife wants to know if he can switch to cardizem instead of his verapamil 300mg . . For that one part D cost so much. Is 300.00 a month. Pharmacy he said send it to [facility] just for this one only. Will call at a later date and let you know what pharm to use from here on out for meds.

## 2019-06-26 ENCOUNTER — Other Ambulatory Visit: Payer: Self-pay | Admitting: Family Medicine

## 2019-06-26 MED ORDER — DILTIAZEM HCL ER 240 MG PO CP24: 240.0000 mg | ORAL_CAPSULE | Freq: Every day | ORAL | 0 refills | Status: DC

## 2019-06-26 NOTE — Telephone Encounter (Signed)
Patient informed new prescription sent to [facility] and verbalized understanding to keep checking BP at home.

## 2019-06-30 ENCOUNTER — Other Ambulatory Visit: Payer: Self-pay | Admitting: Family Medicine

## 2019-06-30 DIAGNOSIS — I10 Essential (primary) hypertension: Secondary | ICD-10-CM

## 2019-07-23 ENCOUNTER — Encounter: Payer: Self-pay | Admitting: Family Medicine

## 2019-07-23 ENCOUNTER — Telehealth: Payer: Self-pay

## 2019-07-23 ENCOUNTER — Other Ambulatory Visit: Payer: Self-pay

## 2019-07-23 ENCOUNTER — Ambulatory Visit (INDEPENDENT_AMBULATORY_CARE_PROVIDER_SITE_OTHER): Payer: Medicare Other | Admitting: Family Medicine

## 2019-07-23 VITALS — BP 116/64 | HR 71 | Temp 97.3°F | Resp 16 | Ht 70.0 in | Wt 240.3 lb

## 2019-07-23 DIAGNOSIS — E291 Testicular hypofunction: Secondary | ICD-10-CM

## 2019-07-23 DIAGNOSIS — I10 Essential (primary) hypertension: Secondary | ICD-10-CM

## 2019-07-23 DIAGNOSIS — Z0000 Encounter for general adult medical examination without abnormal findings: Secondary | ICD-10-CM

## 2019-07-23 DIAGNOSIS — I4510 Unspecified right bundle-branch block: Secondary | ICD-10-CM

## 2019-07-23 DIAGNOSIS — Z1211 Encounter for screening for malignant neoplasm of colon: Secondary | ICD-10-CM

## 2019-07-23 DIAGNOSIS — E785 Hyperlipidemia, unspecified: Secondary | ICD-10-CM | POA: Diagnosis not present

## 2019-07-23 DIAGNOSIS — E039 Hypothyroidism, unspecified: Secondary | ICD-10-CM

## 2019-07-23 DIAGNOSIS — Z87891 Personal history of nicotine dependence: Secondary | ICD-10-CM

## 2019-07-23 MED ORDER — TRIAMTERENE-HCTZ 37.5-25 MG PO TABS: 1.0000 | ORAL_TABLET | Freq: Every day | ORAL | 1 refills | Status: DC

## 2019-07-23 MED ORDER — TESTOSTERONE 20.25 MG/ACT (1.62%) TD GEL: 2.0000 | Freq: Every day | TRANSDERMAL | 5 refills | Status: DC

## 2019-07-23 MED ORDER — ROSUVASTATIN CALCIUM 20 MG PO TABS: 20.0000 mg | ORAL_TABLET | Freq: Every day | ORAL | 1 refills | Status: DC

## 2019-07-23 MED ORDER — VALSARTAN 320 MG PO TABS: 320.0000 mg | ORAL_TABLET | Freq: Every day | ORAL | 1 refills | Status: DC

## 2019-07-23 MED ORDER — LEVOTHYROXINE SODIUM 150 MCG PO TABS: 150.0000 ug | ORAL_TABLET | Freq: Every day | ORAL | 1 refills | Status: DC

## 2019-07-23 MED ORDER — AMLODIPINE BESYLATE 10 MG PO TABS: 10.0000 mg | ORAL_TABLET | Freq: Every day | ORAL | 1 refills | Status: DC

## 2019-07-23 NOTE — Progress Notes (Signed)
Patient: Alan Campbell, Male    DOB: 1950-10-03, 69 y.o.   [name]: [id number]  Visit Date: 07/23/2019  Today's Provider: Loistine Chance, MD   Chief Complaint  Patient presents with  . Medicare Wellness    Subjective:   Alan Campbell is a 69 y.o. male who presents today for his Subsequent Annual Wellness Visit.  Patient/Caregiver input:  Wants to change pharmacy because has medicare and cost  Metabolic syndrome: he will stop Ozempic because of cost and does not want to take metformin  Hypothyroidism: last TSH was high, he is on the same dose of 150 mcg daily for 6 days a week and skips Saturday.  Denies palpitation, change in bowel movements or dry skin   HPI  Review of Systems   Constitutional: Negative for fever or weight change.  Respiratory: Negative for cough and shortness of breath.   Cardiovascular: Negative for chest pain or palpitations.  Gastrointestinal: Negative for abdominal pain, no bowel changes.  Musculoskeletal: Negative for gait problem or joint swelling.  Skin: Negative for rash.  Neurological: Negative for dizziness or headache.  No other specific complaints in a complete review of systems (except as listed in HPI above).  Past Medical History:  Diagnosis Date  . Allergy   . Anemia, iron deficiency   . Cataract   . Hyperlipidemia   . Hypertension   . Low back pain   . Migraine   . Obesity   . OP (osteoporosis)   . Thyroid disease   . Vitamin D deficiency     Past Surgical History:  Procedure Laterality Date  . EYE SURGERY  2016  . LAMINECTOMY  1985   L-5  . SPINE SURGERY  1985    Family History  Problem Relation Age of Onset  . Heart disease Father   . Early death Father   . Hypothyroidism Sister   . Thyroid disease Sister   . Hypothyroidism Sister   . Migraines Sister   . Prostate cancer Neg Hx   . Bladder Cancer Neg Hx   . Kidney cancer Neg Hx     Social History   Socioeconomic History  . Marital status: Married    Spouse  name: donna  . Number of children: 0  . Years of education: Not on file  . Highest education level: Not on file  Occupational History  . Occupation: managing/training   Tobacco Use  . Smoking status: Former Smoker    Packs/day: 0.50    Years: 5.00    Pack years: 2.50    Types: Cigarettes    Quit date: 10/03/1970    Years since quitting: 48.8  . Smokeless tobacco: Never Used  . Tobacco comment: Quit 1972  Substance and Sexual Activity  . Alcohol use: Yes    Alcohol/week: 4.0 standard drinks    Types: 4 Standard drinks or equivalent per week  . Drug use: No  . Sexual activity: Yes    Partners: Female    Birth control/protection: Post-menopausal  Other Topics Concern  . Not on file  Social History Narrative   Married since 1974, no children but they helped raise Tretha Sciara ( wife's niece), also helped raise their God-daughter Lyndee Leo   He is planning on retiring January 2021   Social Determinants of Health   Financial Resource Strain: Low Risk   . Difficulty of Paying Living Expenses: Not hard at all  Food Insecurity: No Food Insecurity  . Worried About Charity fundraiser in the  Last Year: Never true  . Ran Out of Food in the Last Year: Never true  Transportation Needs: No Transportation Needs  . Lack of Transportation (Medical): No  . Lack of Transportation (Non-Medical): No  Physical Activity: Insufficiently Active  . Days of Exercise per Week: 3 days  . Minutes of Exercise per Session: 30 min  Stress: No Stress Concern Present  . Feeling of Stress : Not at all  Social Connections: Not Isolated  . Frequency of Communication with Friends and Family: More than three times a week  . Frequency of Social Gatherings with Friends and Family: More than three times a week  . Attends Religious Services: More than 4 times per year  . Active Member of Clubs or Organizations: Yes  . Attends Archivist Meetings: More than 4 times per year  . Marital Status: Married   Human resources officer Violence: Not At Risk  . Fear of Current or Ex-Partner: No  . Emotionally Abused: No  . Physically Abused: No  . Sexually Abused: No    Outpatient Encounter Medications as of 07/23/2019  Medication Sig Note  . amLODipine (NORVASC) 10 MG tablet Take 1 tablet (10 mg total) by mouth daily.   . Cholecalciferol (VITAMIN D) 2000 units CAPS Take 1 capsule (2,000 Units total) by mouth daily.   Marland Kitchen diltiazem (DILACOR XR) 240 MG 24 hr capsule Take 1 capsule (240 mg total) by mouth daily.   . furosemide (LASIX) 20 MG tablet Take 1 tablet by mouth daily as needed.   Marland Kitchen levothyroxine (SYNTHROID) 150 MCG tablet Take 1 tablet (150 mcg total) by mouth daily.   . Multiple Vitamins-Minerals (CENTRUM SILVER 50+MEN) TABS Take 1 tablet by mouth daily.   . OMEGA-3 FATTY ACIDS PO Take 1 tablet by mouth daily. 12/31/2014: Received from: [facility]:   . rosuvastatin (CRESTOR) 20 MG tablet Take 1 tablet (20 mg total) by mouth daily.   . Testosterone 20.25 MG/ACT (1.62%) GEL Apply 2 each topically daily. 2 pumps daily   . triamterene-hydrochlorothiazide (MAXZIDE-25) 37.5-25 MG tablet Take 1 tablet by mouth daily.   . [DISCONTINUED] amLODipine (NORVASC) 10 MG tablet TAKE 1 TABLET(10 MG) BY MOUTH DAILY   . [DISCONTINUED] levothyroxine (SYNTHROID) 150 MCG tablet Take 1 tablet (150 mcg total) by mouth daily.   . [DISCONTINUED] rosuvastatin (CRESTOR) 20 MG tablet Take 1 tablet (20 mg total) by mouth daily.   . [DISCONTINUED] Semaglutide, 1 MG/DOSE, (OZEMPIC, 1 MG/DOSE,) 2 MG/1.5ML SOPN Inject 1 mg into the skin once a week.   . [DISCONTINUED] Testosterone 20.25 MG/ACT (1.62%) GEL APPLY 4 PUMPS AS DIRECTED DAILY   . [DISCONTINUED] triamterene-hydrochlorothiazide (MAXZIDE-25) 37.5-25 MG tablet TAKE 1 TABLET BY MOUTH ONCE DAILY   . valsartan (DIOVAN) 320 MG tablet Take 1 tablet (320 mg total) by mouth daily.   . [DISCONTINUED] valsartan (DIOVAN) 320 MG tablet TAKE 1 TABLET(320 MG)  BY MOUTH DAILY (Patient not taking: Reported on 07/23/2019)    No facility-administered encounter medications on file as of 07/23/2019.    No Known Allergies  Care Team Updated in EHR: Yes  Last Vision Exam:  Wears corrective lenses: Yes - had exam yesterday Last Dental Exam: goes every 6 months  Last Hearing Exam: today  Wears Hearing Aids: No   Hearing Screening   125Hz  250Hz  500Hz  1000Hz  2000Hz  3000Hz  4000Hz  6000Hz  8000Hz   Right ear:   Pass Pass Pass  Pass    Left ear:   [place]  Visual Acuity Screening   Right eye Left eye Both eyes  Without correction:     With correction: 20 20 20  40 20 20   Functional Ability / Safety Screening 1.  Was the timed Get Up and Go test longer than 30 seconds?  yes 2.  Does the patient need help with the phone, transportation, shopping,      preparing meals, housework, laundry, medications, or managing money?  no 3.  Does the patient's home have:  loose throw rugs in the hallway?   yes      Grab bars in the bathroom? no      Handrails on the stairs?   yes      Poor lighting?   yes 4.  Has the patient noticed any hearing difficulties?   no  Diet Recall and Exercise Regimen: he is skipping breakfast, doing most of the cook since retirement, sandwich for lunch, mostly chicken and vegetables at night, avoiding white potatoes. He drinks mild daily and likes cheese   Fall Risk: See screening under Objective Information  Depression Screen: See screening under Objective Information  Advanced Directives: A voluntary discussion about advance care planning including the explanation and discussion of advance directives was discussed with the patient. Explanation about the health care proxy and living will was reviewed.  During this discussion, the patient was able to identify a health care proxy as wife  and plans/does not plan to fill out the paperwork required and will bring this to our office to keep on file. Does patient have a HCPOA?     yes If yes, name and contact information: Taydem Lapp  Does patient have a living will or MOST form?  yes  Cancer Screenings: Skin: discussed atypical lesions Lung: Low Dose CT Chest recommended if Age 77-80 years, 30 pack-year currently smoking OR have quit w/in 15years. Patient does not qualify.  Lifestyle risk factor issued reviewed: Diet, exercise, weight management, advised patient smoking is not healthy, nutrition/diet.   Prostate: discussed USPTF Colon: we will send a refill   Additional Screenings:  Hepatitis B/HIV/Syphillis: N/A Hepatitis C Screening: up to date  Intimate Partner Violence: negative screen  AAA Screen: Men age 20 to 3 years if ever smoked recommended to get a one time AAA ultrasound screening exam. Patient does qualify.  Objective:   Vitals: BP 116/64 (BP Location: Left Arm, Patient Position: Sitting, Cuff Size: Normal)   Pulse 71   Temp (!) 97.3 F (36.3 C) (Temporal)   Resp 16   Ht 5\' 10"  (1.778 m)   Wt 240 lb 4.8 oz (109 kg)   SpO2 97%   BMI 34.48 kg/m  Body mass index is 34.48 kg/m.  Lab Results  Component Value Date   CHOL 160 06/02/2019   CHOL 168 05/28/2018   CHOL 150 10/18/2017   Lab Results  Component Value Date   HDL 48 06/02/2019   HDL 56 05/28/2018   HDL 43 10/18/2017   Lab Results  Component Value Date   LDLCALC 91 06/02/2019   LDLCALC 91 05/28/2018   LDLCALC 89 10/18/2017   Lab Results  Component Value Date   TRIG 111 06/02/2019   TRIG 111 05/28/2018   TRIG 86 10/18/2017   Lab Results  Component Value Date   CHOLHDL 3.3 06/02/2019   CHOLHDL 3.0 05/28/2018   CHOLHDL 3.5 10/18/2017   No results found for: LDLDIRECT   Hearing Screening   125Hz  250Hz  500Hz  1000Hz  2000Hz  3000Hz  4000Hz  6000Hz  8000Hz   Right  ear:   Pass Pass Pass  Pass    Left ear:   Pass Pass Pass  Pass      Visual Acuity Screening   Right eye Left eye Both eyes  Without correction:     With correction: 20 20 20  40 20 20    Physical  Exam Constitutional: Patient appears well-developed and well-nourished. Obese  No distress.  HEENT: head atraumatic, normocephalic, pupils equal and reactive to light Cardiovascular: Normal rate, regular rhythm and normal heart sounds.  No murmur heard. No BLE edema. Pulmonary/Chest: Effort normal and breath sounds normal. No respiratory distress. Abdominal: Soft.  There is no tenderness. Psychiatric: Patient has a normal mood and affect. behavior is normal. Judgment and thought content normal.   Cognitive Testing - 6-CIT  Correct? Score   What year is it? yes 0 Yes = 0    No = 4  What month is it? yes 0 Yes = 0    No = 3  Remember:     Pia Mau, [place], [place]     What time is it? yes 0 Yes = 0    No = 3  Count backwards from 20 to 1 yes 0 Correct = 0    1 error = 2   More than 1 error = 4  Say the months of the year in reverse. yes 0 Correct = 0    1 error = 2   More than 1 error = 4  What address did I ask you to remember? yes 0 Correct = 0  1 error = 2    2 error = 4    3 error = 6    4 error = 8    All wrong = 10       TOTAL SCORE  0/28   Interpretation:  Normal  Normal (0-7) Abnormal (8-28)   Fall Risk: Fall Risk  07/23/2019 06/02/2019 04/10/2019 02/25/2019 05/28/2018  Falls in the past year? 0 0 0 0 0  Number falls in past yr: 0 0 0 0 -  Injury with Fall? 0 0 0 0 -    Depression Screen Depression screen [facility] 2/9 07/23/2019 06/02/2019 04/10/2019 02/25/2019 09/18/2018  Decreased Interest 0 0 0 0 0  Down, Depressed, Hopeless 0 0 0 0 0  PHQ - 2 Score 0 0 0 0 0  Altered sleeping 0 0 0 0 0  Tired, decreased energy 0 0 0 0 0  Change in appetite 0 0 0 0 0  Feeling bad or failure about yourself  0 0 0 0 0  Trouble concentrating 0 0 0 0 0  Moving slowly or fidgety/restless 0 0 0 0 0  Suicidal thoughts 0 0 0 0 0  PHQ-9 Score 0 0 0 0 0  Difficult doing work/chores - - Not difficult at all Not difficult at all -    Recent Results (from the past 2160 hour(s))  Lipid panel      Status: None   Collection Time: 06/02/19 12:00 AM  Result Value Ref Range   Cholesterol 160 <200 mg/dL   HDL 48 > OR = 40 mg/dL   Triglycerides 111 <150 mg/dL   LDL Cholesterol (Calc) 91 mg/dL (calc)    Comment: Reference range: <100 . Desirable range <100 mg/dL for primary prevention;   <70 mg/dL for patients with CHD or diabetic patients  with > or = 2 CHD risk factors. Marland Kitchen LDL-C is now calculated using the Martin-Hopkins  calculation, which is a validated novel method providing  better accuracy than the Friedewald equation in the  estimation of LDL-C.  Cresenciano Genre et al. Annamaria Helling. [id number]): 2061-2068  (http://education.[facility].com/faq/FAQ164)    Total CHOL/HDL Ratio 3.3 <5.0 (calc)   Non-HDL Cholesterol (Calc) 112 <130 mg/dL (calc)    Comment: For patients with diabetes plus 1 major ASCVD risk  factor, treating to a non-HDL-C goal of <100 mg/dL  (LDL-C of <70 mg/dL) is considered a therapeutic  option.   TSH     Status: Abnormal   Collection Time: 06/02/19 12:00 AM  Result Value Ref Range   TSH 4.58 (H) 0.40 - 4.50 mIU/L  Hemoglobin A1c     Status: None   Collection Time: 06/02/19 12:00 AM  Result Value Ref Range   Hgb A1c MFr Bld 4.6 <5.7 % of total Hgb    Comment: For the purpose of screening for the presence of diabetes: . <5.7%       Consistent with the absence of diabetes 5.7-6.4%    Consistent with increased risk for diabetes             (prediabetes) > or =6.5%  Consistent with diabetes . This assay result is consistent with a decreased risk of diabetes. . Currently, no consensus exists regarding use of hemoglobin A1c for diagnosis of diabetes in children. . According to American Diabetes Association (ADA) guidelines, hemoglobin A1c <7.0% represents optimal control in non-pregnant diabetic patients. Different metrics may apply to specific patient populations.  Standards of Medical Care in Diabetes(ADA). .    Mean Plasma Glucose 85 (calc)   eAG  (mmol/L) 4.7 (calc)  COMPLETE METABOLIC PANEL WITH GFR     Status: Abnormal   Collection Time: 06/02/19 12:00 AM  Result Value Ref Range   Glucose, Bld 102 (H) 65 - 99 mg/dL    Comment: .            Fasting reference interval . For someone without known diabetes, a glucose value between 100 and 125 mg/dL is consistent with prediabetes and should be confirmed with a follow-up test. .    BUN 30 (H) 7 - 25 mg/dL   Creat 1.20 0.70 - 1.25 mg/dL    Comment: For patients >24 years of age, the reference limit for Creatinine is approximately 13% higher for people identified as African-American. .    GFR, Est Non African American 61 > OR = 60 mL/min/1.58m2   GFR, Est African American 71 > OR = 60 mL/min/1.88m2   BUN/Creatinine Ratio 25 (H) 6 - 22 (calc)   Sodium 141 135 - 146 mmol/L   Potassium 4.2 3.5 - 5.3 mmol/L   Chloride 105 98 - 110 mmol/L   CO2 28 20 - 32 mmol/L   Calcium 10.0 8.6 - 10.3 mg/dL   Total Protein 7.0 6.1 - 8.1 g/dL   Albumin 4.7 3.6 - 5.1 g/dL   Globulin 2.3 1.9 - 3.7 g/dL (calc)   AG Ratio 2.0 1.0 - 2.5 (calc)   Total Bilirubin 0.8 0.2 - 1.2 mg/dL   Alkaline phosphatase (APISO) 73 35 - 144 U/L   AST 25 10 - 35 U/L   ALT 40 9 - 46 U/L  CBC with Differential/Platelet     Status: None   Collection Time: 06/02/19 12:00 AM  Result Value Ref Range   WBC 7.2 3.8 - 10.8 Thousand/uL   RBC 4.30 4.20 - 5.80 Million/uL   Hemoglobin 13.8 13.2 - 17.1 g/dL   HCT 39.0  38.5 - 50.0 %   MCV 90.7 80.0 - 100.0 fL   MCH 32.1 27.0 - 33.0 pg   MCHC 35.4 32.0 - 36.0 g/dL   RDW 13.1 11.0 - 15.0 %   Platelets 218 140 - 400 Thousand/uL   MPV 9.3 7.5 - 12.5 fL   Neutro Abs 4,968 1,500 - 7,800 cells/uL   Lymphs Abs 1,634 850 - 3,900 cells/uL   Absolute Monocytes 360 200 - 950 cells/uL   Eosinophils Absolute 187 15 - 500 cells/uL   Basophils Absolute 50 0 - 200 cells/uL   Neutrophils Relative % 69 %   Total Lymphocyte 22.7 %   Monocytes Relative 5.0 %   Eosinophils Relative 2.6  %   Basophils Relative 0.7 %     Assessment & Plan:      Exercise Activities and Dietary recommendations Goals:   He wants to lose weight, he retired and will try to be more active  Discussed health benefits of physical activity, and encouraged him to engage in regular exercise appropriate for his age and condition.   Immunization History  Administered Date(s) Administered  . Influenza, High Dose Seasonal PF 03/18/2017, 02/21/2018, 04/20/2019  . Influenza, Seasonal, Injecte, Preservative Fre 04/21/2011, 03/25/2014  . Influenza-Unspecified 04/16/2015, 03/30/2016  . Pneumococcal Conjugate-13 05/30/2016  . Pneumococcal Polysaccharide-23 12/24/2006, 05/06/2015  . Tdap 12/30/2009, 06/10/2019  . Zoster 03/28/2012  . Zoster Recombinat (Shingrix) 06/02/2019    Health Maintenance  Topic Date Due  . COLONOSCOPY  08/15/2021  . TETANUS/TDAP  06/09/2029  . INFLUENZA VACCINE  Completed  . Hepatitis C Screening  Completed  . PNA vac Low Risk Adult  Completed     Meds ordered this encounter  Medications  . Testosterone 20.25 MG/ACT (1.62%) GEL    Sig: Apply 2 each topically daily. 2 pumps daily    Dispense:  75 g    Refill:  5  . rosuvastatin (CRESTOR) 20 MG tablet    Sig: Take 1 tablet (20 mg total) by mouth daily.    Dispense:  90 tablet    Refill:  1  . triamterene-hydrochlorothiazide (MAXZIDE-25) 37.5-25 MG tablet    Sig: Take 1 tablet by mouth daily.    Dispense:  90 tablet    Refill:  1  . amLODipine (NORVASC) 10 MG tablet    Sig: Take 1 tablet (10 mg total) by mouth daily.    Dispense:  90 tablet    Refill:  1  . levothyroxine (SYNTHROID) 150 MCG tablet    Sig: Take 1 tablet (150 mcg total) by mouth daily.    Dispense:  90 tablet    Refill:  1  . valsartan (DIOVAN) 320 MG tablet    Sig: Take 1 tablet (320 mg total) by mouth daily.    Dispense:  90 tablet    Refill:  1    Current Outpatient Medications:  .  amLODipine (NORVASC) 10 MG tablet, Take 1 tablet (10  mg total) by mouth daily., Disp: 90 tablet, Rfl: 1 .  Cholecalciferol (VITAMIN D) 2000 units CAPS, Take 1 capsule (2,000 Units total) by mouth daily., Disp: 30 capsule, Rfl: 0 .  diltiazem (DILACOR XR) 240 MG 24 hr capsule, Take 1 capsule (240 mg total) by mouth daily., Disp: 90 capsule, Rfl: 0 .  furosemide (LASIX) 20 MG tablet, Take 1 tablet by mouth daily as needed., Disp: , Rfl:  .  levothyroxine (SYNTHROID) 150 MCG tablet, Take 1 tablet (150 mcg total) by mouth daily., Disp: 90 tablet,  Rfl: 1 .  Multiple Vitamins-Minerals (CENTRUM SILVER 50+MEN) TABS, Take 1 tablet by mouth daily., Disp: , Rfl:  .  OMEGA-3 FATTY ACIDS PO, Take 1 tablet by mouth daily., Disp: , Rfl:  .  rosuvastatin (CRESTOR) 20 MG tablet, Take 1 tablet (20 mg total) by mouth daily., Disp: 90 tablet, Rfl: 1 .  Testosterone 20.25 MG/ACT (1.62%) GEL, Apply 2 each topically daily. 2 pumps daily, Disp: 75 g, Rfl: 5 .  triamterene-hydrochlorothiazide (MAXZIDE-25) 37.5-25 MG tablet, Take 1 tablet by mouth daily., Disp: 90 tablet, Rfl: 1 .  valsartan (DIOVAN) 320 MG tablet, Take 1 tablet (320 mg total) by mouth daily., Disp: 90 tablet, Rfl: 1 Medications Discontinued During This Encounter  Medication Reason  . Semaglutide, 1 MG/DOSE, (OZEMPIC, 1 MG/DOSE,) 2 MG/1.5ML SOPN Cost of medication  . levothyroxine (SYNTHROID) 150 MCG tablet Reorder  . triamterene-hydrochlorothiazide (MAXZIDE-25) 37.5-25 MG tablet Reorder  . rosuvastatin (CRESTOR) 20 MG tablet Reorder  . Testosterone 20.25 MG/ACT (1.62%) GEL Reorder  . amLODipine (NORVASC) 10 MG tablet Reorder  . valsartan (DIOVAN) 320 MG tablet Reorder    I have personally reviewed and addressed the Medicare Annual Wellness health risk assessment questionnaire and have noted the following in the patient's chart:  A.         Medical and social history & family history B.         Use of alcohol, tobacco or illicit drugs  C.         Current medications and supplements D.          Functional and Cognitive ability and status E.         Nutritional status F.         Physical activity G.        Advance directives H.         List of other physicians I.          Hospitalizations, surgeries, and ER visits in previous 12 months J.         [place] such as hearing and vision if needed, cognitive and depression L.         Referrals and appointments - gastroenterologist  In addition, I have reviewed and discussed with patient certain preventive protocols, quality metrics, and best practice recommendations. A written personalized care plan for preventive services as well as general preventive health recommendations were provided to patient.   See attached scanned questionnaire for additional information.

## 2019-07-23 NOTE — Patient Instructions (Signed)

## 2019-07-23 NOTE — Telephone Encounter (Signed)
Gastroenterology Pre-Procedure Review  Request Date: 08/14/19 Requesting Physician: Dr. Marius Ditch  PATIENT REVIEW QUESTIONS: The patient responded to the following health history questions as indicated:    1. Are you having any GI issues? no 2. Do you have a personal history of Polyps? no 3. Do you have a family history of Colon Cancer or Polyps? no 4. Diabetes Mellitus? no 5. Joint replacements in the past 12 months?no 6. Major health problems in the past 3 months?no 7. Any artificial heart valves, MVP, or defibrillator?no    MEDICATIONS & ALLERGIES:    Patient reports the following regarding taking any anticoagulation/antiplatelet therapy:   Plavix, Coumadin, Eliquis, Xarelto, Lovenox, Pradaxa, Brilinta, or Effient? no Aspirin? no  Patient confirms/reports the following medications:  Current Outpatient Medications  Medication Sig Dispense Refill  . amLODipine (NORVASC) 10 MG tablet Take 1 tablet (10 mg total) by mouth daily. 90 tablet 1  . Cholecalciferol (VITAMIN D) 2000 units CAPS Take 1 capsule (2,000 Units total) by mouth daily. 30 capsule 0  . diltiazem (DILACOR XR) 240 MG 24 hr capsule Take 1 capsule (240 mg total) by mouth daily. 90 capsule 0  . furosemide (LASIX) 20 MG tablet Take 1 tablet by mouth daily as needed.    Marland Kitchen levothyroxine (SYNTHROID) 150 MCG tablet Take 1 tablet (150 mcg total) by mouth daily. 90 tablet 1  . Multiple Vitamins-Minerals (CENTRUM SILVER 50+MEN) TABS Take 1 tablet by mouth daily.    . OMEGA-3 FATTY ACIDS PO Take 1 tablet by mouth daily.    . rosuvastatin (CRESTOR) 20 MG tablet Take 1 tablet (20 mg total) by mouth daily. 90 tablet 1  . Testosterone 20.25 MG/ACT (1.62%) GEL Apply 2 each topically daily. 2 pumps daily 75 g 5  . triamterene-hydrochlorothiazide (MAXZIDE-25) 37.5-25 MG tablet Take 1 tablet by mouth daily. 90 tablet 1  . valsartan (DIOVAN) 320 MG tablet Take 1 tablet (320 mg total) by mouth daily. 90 tablet 1   No current  facility-administered medications for this visit.    Patient confirms/reports the following allergies:  No Known Allergies  No orders of the defined types were placed in this encounter.   AUTHORIZATION INFORMATION Primary Insurance: 1D#: Group #:  Secondary Insurance: 1D#: Group #:  SCHEDULE INFORMATION: Date: 08/14/19 Time: Location:[facility]

## 2019-07-24 LAB — TSH: TSH: 1.97 mIU/L (ref 0.40–4.50)

## 2019-08-03 ENCOUNTER — Encounter: Payer: Self-pay | Admitting: Family Medicine

## 2019-08-12 ENCOUNTER — Other Ambulatory Visit
Admission: RE | Admit: 2019-08-12 | Discharge: 2019-08-12 | Disposition: A | Discharge status: Home / Self Care | Payer: Medicare Other | Source: Ambulatory Visit | Attending: Gastroenterology | Admitting: Gastroenterology

## 2019-08-12 DIAGNOSIS — Z20822 Contact with and (suspected) exposure to covid-19: Secondary | ICD-10-CM | POA: Diagnosis not present

## 2019-08-12 DIAGNOSIS — Z01812 Encounter for preprocedural laboratory examination: Secondary | ICD-10-CM | POA: Insufficient documentation

## 2019-08-12 LAB — SARS CORONAVIRUS 2 (TAT 6-24 HRS): SARS Coronavirus 2: NEGATIVE

## 2019-08-14 ENCOUNTER — Other Ambulatory Visit: Payer: Self-pay

## 2019-08-14 ENCOUNTER — Encounter
Admission: RE | Disposition: A | Discharge status: Home / Self Care | Payer: Self-pay | Source: Home / Self Care | Attending: Gastroenterology

## 2019-08-14 ENCOUNTER — Ambulatory Visit: Payer: Medicare Other | Admitting: Registered Nurse

## 2019-08-14 ENCOUNTER — Encounter: Payer: Self-pay | Admitting: Gastroenterology

## 2019-08-14 ENCOUNTER — Ambulatory Visit
Admission: RE | Admit: 2019-08-14 | Discharge: 2019-08-14 | Disposition: A | Discharge status: Home / Self Care | Payer: Medicare Other | Attending: Gastroenterology | Admitting: Gastroenterology

## 2019-08-14 DIAGNOSIS — D124 Benign neoplasm of descending colon: Secondary | ICD-10-CM | POA: Insufficient documentation

## 2019-08-14 DIAGNOSIS — Z79899 Other long term (current) drug therapy: Secondary | ICD-10-CM | POA: Diagnosis not present

## 2019-08-14 DIAGNOSIS — D120 Benign neoplasm of cecum: Secondary | ICD-10-CM | POA: Insufficient documentation

## 2019-08-14 DIAGNOSIS — K644 Residual hemorrhoidal skin tags: Secondary | ICD-10-CM | POA: Diagnosis not present

## 2019-08-14 DIAGNOSIS — D123 Benign neoplasm of transverse colon: Secondary | ICD-10-CM | POA: Diagnosis not present

## 2019-08-14 DIAGNOSIS — Z6833 Body mass index (BMI) 33.0-33.9, adult: Secondary | ICD-10-CM | POA: Diagnosis not present

## 2019-08-14 DIAGNOSIS — K635 Polyp of colon: Secondary | ICD-10-CM

## 2019-08-14 DIAGNOSIS — M1990 Unspecified osteoarthritis, unspecified site: Secondary | ICD-10-CM | POA: Insufficient documentation

## 2019-08-14 DIAGNOSIS — Z8349 Family history of other endocrine, nutritional and metabolic diseases: Secondary | ICD-10-CM | POA: Insufficient documentation

## 2019-08-14 DIAGNOSIS — G43909 Migraine, unspecified, not intractable, without status migrainosus: Secondary | ICD-10-CM | POA: Insufficient documentation

## 2019-08-14 DIAGNOSIS — Z87891 Personal history of nicotine dependence: Secondary | ICD-10-CM | POA: Diagnosis not present

## 2019-08-14 DIAGNOSIS — D509 Iron deficiency anemia, unspecified: Secondary | ICD-10-CM | POA: Insufficient documentation

## 2019-08-14 DIAGNOSIS — E669 Obesity, unspecified: Secondary | ICD-10-CM | POA: Insufficient documentation

## 2019-08-14 DIAGNOSIS — E559 Vitamin D deficiency, unspecified: Secondary | ICD-10-CM | POA: Insufficient documentation

## 2019-08-14 DIAGNOSIS — E785 Hyperlipidemia, unspecified: Secondary | ICD-10-CM | POA: Insufficient documentation

## 2019-08-14 DIAGNOSIS — D125 Benign neoplasm of sigmoid colon: Secondary | ICD-10-CM | POA: Diagnosis not present

## 2019-08-14 DIAGNOSIS — K648 Other hemorrhoids: Secondary | ICD-10-CM | POA: Diagnosis not present

## 2019-08-14 DIAGNOSIS — Z1211 Encounter for screening for malignant neoplasm of colon: Secondary | ICD-10-CM | POA: Diagnosis not present

## 2019-08-14 DIAGNOSIS — E039 Hypothyroidism, unspecified: Secondary | ICD-10-CM | POA: Diagnosis not present

## 2019-08-14 DIAGNOSIS — M545 Low back pain: Secondary | ICD-10-CM | POA: Diagnosis not present

## 2019-08-14 DIAGNOSIS — Z8249 Family history of ischemic heart disease and other diseases of the circulatory system: Secondary | ICD-10-CM | POA: Diagnosis not present

## 2019-08-14 DIAGNOSIS — K5730 Diverticulosis of large intestine without perforation or abscess without bleeding: Secondary | ICD-10-CM | POA: Diagnosis not present

## 2019-08-14 DIAGNOSIS — M810 Age-related osteoporosis without current pathological fracture: Secondary | ICD-10-CM | POA: Diagnosis not present

## 2019-08-14 DIAGNOSIS — I10 Essential (primary) hypertension: Secondary | ICD-10-CM | POA: Diagnosis not present

## 2019-08-14 DIAGNOSIS — Z820 Family history of epilepsy and other diseases of the nervous system: Secondary | ICD-10-CM | POA: Insufficient documentation

## 2019-08-14 HISTORY — PX: COLONOSCOPY WITH PROPOFOL: SHX5780

## 2019-08-14 SURGERY — COLONOSCOPY WITH PROPOFOL
Anesthesia: General

## 2019-08-14 MED ORDER — SODIUM CHLORIDE 0.9 % IV SOLN
INTRAVENOUS | Status: DC
  Administered 2019-08-14: 09:00:00 1000 mL via INTRAVENOUS

## 2019-08-14 MED ORDER — PROPOFOL 10 MG/ML IV BOLUS
INTRAVENOUS | Status: DC | PRN
  Administered 2019-08-14: 100 mg via INTRAVENOUS

## 2019-08-14 MED ORDER — LIDOCAINE HCL (CARDIAC) PF 100 MG/5ML IV SOSY
PREFILLED_SYRINGE | INTRAVENOUS | Status: DC | PRN
  Administered 2019-08-14: 40 mg via INTRAVENOUS

## 2019-08-14 MED ORDER — EPHEDRINE SULFATE 50 MG/ML IJ SOLN
INTRAMUSCULAR | Status: DC | PRN
  Administered 2019-08-14: 10 mg via INTRAVENOUS

## 2019-08-14 MED ORDER — LIDOCAINE HCL (PF) 2 % IJ SOLN
INTRAMUSCULAR | Status: AC
  Filled 2019-08-14: qty 5

## 2019-08-14 MED ORDER — PROPOFOL 500 MG/50ML IV EMUL
INTRAVENOUS | Status: DC | PRN
  Administered 2019-08-14: 150 ug/kg/min via INTRAVENOUS

## 2019-08-14 MED ORDER — PROPOFOL 500 MG/50ML IV EMUL
INTRAVENOUS | Status: AC
  Filled 2019-08-14: qty 50

## 2019-08-14 NOTE — H&P (Signed)
Alan Darby, MD [address]  [place]  [place], [place] [address]  Main: [phone]  Fax: [phone] Pager: [phone]  Primary Care Physician:  [name], MD Primary Gastroenterologist:  Dr. Cephas Campbell  Pre-Procedure History & Physical: HPI:  Alan Campbell is a 69 y.o. male is here for an colonoscopy.   Past Medical History:  Diagnosis Date  . Allergy   . Anemia, iron deficiency   . Cataract   . Hyperlipidemia   . Hypertension   . Low back pain   . Migraine   . Obesity   . OP (osteoporosis)   . Thyroid disease   . Vitamin D deficiency     Past Surgical History:  Procedure Laterality Date  . BACK SURGERY    . EYE SURGERY  2016  . LAMINECTOMY  1985   L-5  . [place]    Prior to Admission medications   Medication Sig Start Date End Date Taking? Authorizing Provider  amLODipine (NORVASC) 10 MG tablet Take 1 tablet (10 mg total) by mouth daily. 07/23/19  Yes Sowles, Drue Stager, MD  Cholecalciferol (VITAMIN D) 2000 units CAPS Take 1 capsule (2,000 Units total) by mouth daily. 07/07/15  Yes Sowles, Drue Stager, MD  diltiazem (DILACOR XR) 240 MG 24 hr capsule Take 1 capsule (240 mg total) by mouth daily. 06/26/19  Yes Sowles, Drue Stager, MD  levothyroxine (SYNTHROID) 150 MCG tablet Take 1 tablet (150 mcg total) by mouth daily. 07/23/19  Yes Sowles, Drue Stager, MD  Multiple Vitamins-Minerals (CENTRUM SILVER 50+MEN) TABS Take 1 tablet by mouth daily.   Yes [provider]  OMEGA-3 FATTY ACIDS PO Take 1 tablet by mouth daily. 01/06/07  Yes [provider]  rosuvastatin (CRESTOR) 20 MG tablet Take 1 tablet (20 mg total) by mouth daily. 07/23/19  Yes Sowles, Drue Stager, MD  Testosterone 20.25 MG/ACT (1.62%) GEL Apply 2 each topically daily. 2 pumps daily 07/23/19  Yes Sowles, Drue Stager, MD  triamterene-hydrochlorothiazide (MAXZIDE-25) 37.5-25 MG tablet Take 1 tablet by mouth daily. 07/23/19  Yes Sowles, Drue Stager, MD  valsartan (DIOVAN) 320 MG tablet Take 1  tablet (320 mg total) by mouth daily. 07/23/19  Yes Sowles, Drue Stager, MD  furosemide (LASIX) 20 MG tablet Take 1 tablet by mouth daily as needed. 07/06/19 07/05/20  Lujean Amel D, MD    Allergies as of 07/23/2019  . (No Known Allergies)    Family History  Problem Relation Age of Onset  . Heart disease Father   . Early death Father   . Hypothyroidism Sister   . Thyroid disease Sister   . Hypothyroidism Sister   . Migraines Sister   . Prostate cancer Neg Hx   . Bladder Cancer Neg Hx   . Kidney cancer Neg Hx     Social History   Socioeconomic History  . Marital status: Married    Spouse name: Alan Campbell  . Number of children: 0  . Years of education: Not on file  . Highest education level: Not on file  Occupational History  . Occupation: managing/training   Tobacco Use  . Smoking status: Former Smoker    Packs/day: 0.50    Years: 5.00    Pack years: 2.50    Types: Cigarettes    Quit date: 10/03/1970    Years since quitting: 48.8  . Smokeless tobacco: Never Used  . Tobacco comment: Quit 1972  Substance and Sexual Activity  . Alcohol use: Yes    Alcohol/week: 4.0 standard drinks  Types: 4 Standard drinks or equivalent per week  . Drug use: No  . Sexual activity: Yes    Partners: Female    Birth control/protection: Post-menopausal  Other Topics Concern  . Not on file  Social History Narrative   Married since 1974, no children but they helped raise Alan Campbell ( wife's niece), also helped raise their God-daughter Alan Campbell   He is planning on retiring January 2021   Social Determinants of Health   Financial Resource Strain: Low Risk   . Difficulty of Paying Living Expenses: Not hard at all  Food Insecurity: No Food Insecurity  . Worried About Charity fundraiser in the Last Year: Never true  . Ran Out of Food in the Last Year: Never true  Transportation Needs: No Transportation Needs  . Lack of Transportation (Medical): No  . Lack of Transportation (Non-Medical): No    Physical Activity: Insufficiently Active  . Days of Exercise per Week: 3 days  . Minutes of Exercise per Session: 30 min  Stress: No Stress Concern Present  . Feeling of Stress : Not at all  Social Connections: Not Isolated  . Frequency of Communication with Friends and Family: More than three times a week  . Frequency of Social Gatherings with Friends and Family: More than three times a week  . Attends Religious Services: More than 4 times per year  . Active Member of Clubs or Organizations: Yes  . Attends Archivist Meetings: More than 4 times per year  . Marital Status: Married  Human resources officer Violence: Not At Risk  . Fear of Current or Ex-Partner: No  . Emotionally Abused: No  . Physically Abused: No  . Sexually Abused: No    Review of Systems: See HPI, otherwise negative ROS  Physical Exam: BP 120/75   Pulse 64   Temp (!) 97 F (36.1 C) (Temporal)   Resp 18   Ht 5' 10.5" (1.791 m)   Wt 108.1 kg   SpO2 100%   BMI 33.71 kg/m  General:   Alert,  pleasant and cooperative in NAD Head:  Normocephalic and atraumatic. Neck:  Supple; no masses or thyromegaly. Lungs:  Clear throughout to auscultation.    Heart:  Regular rate and rhythm. Abdomen:  Soft, nontender and nondistended. Normal bowel sounds, without guarding, and without rebound.   Neurologic:  Alert and  oriented x4;  grossly normal neurologically.  Impression/Plan: Alan Campbell is here for an colonoscopy to be performed for colon cancer screening  Risks, benefits, limitations, and alternatives regarding  colonoscopy have been reviewed with the patient.  Questions have been answered.  All parties agreeable.   Sherri Sear, MD  08/14/2019, 9:11 AM

## 2019-08-14 NOTE — Op Note (Signed)
[facility] Gastroenterology Patient Name: Alan Campbell Procedure Date: 08/14/2019 9:16 AM [name]: [id number] Account #: [id number] Date of Birth: 06-Apr-1951 Admit Type: Outpatient Age: 69 Room: [facility] ENDO ROOM 1 Gender: Male Note Status: Finalized Procedure:             Colonoscopy Indications:           Screening for colorectal malignant neoplasm, Last                         colonoscopy: February 2013 Providers:             Lin Landsman MD, MD Medicines:             Monitored Anesthesia Care Complications:         No immediate complications. Estimated blood loss:                         Minimal. Procedure:             Pre-Anesthesia Assessment:                        - Prior to the procedure, a History and Physical was                         performed, and patient medications and allergies were                         reviewed. The patient is competent. The risks and                         benefits of the procedure and the sedation options and                         risks were discussed with the patient. All questions                         were answered and informed consent was obtained.                         Patient identification and proposed procedure were                         verified by the physician, the nurse, the                         anesthesiologist, the anesthetist and the technician                         in the pre-procedure area in the procedure room in the                         endoscopy suite. Mental Status Examination: alert and                         oriented. Airway Examination: normal oropharyngeal                         airway and neck mobility. Respiratory Examination:  clear to auscultation. CV Examination: normal.                         Prophylactic Antibiotics: The patient does not require                         prophylactic antibiotics. Prior Anticoagulants: The   patient has taken no previous anticoagulant or                         antiplatelet agents. ASA Grade Assessment: II - A                         patient with mild systemic disease. After reviewing                         the risks and benefits, the patient was deemed in                         satisfactory condition to undergo the procedure. The                         anesthesia plan was to use monitored anesthesia care                         (MAC). Immediately prior to administration of                         medications, the patient was re-assessed for adequacy                         to receive sedatives. The heart rate, respiratory                         rate, oxygen saturations, blood pressure, adequacy of                         pulmonary ventilation, and response to care were                         monitored throughout the procedure. The physical                         status of the patient was re-assessed after the                         procedure.                        After obtaining informed consent, the colonoscope was                         passed under direct vision. Throughout the procedure,                         the patient's blood pressure, pulse, and oxygen                         saturations were monitored continuously. The  Colonoscope was introduced through the anus and                         advanced to the the cecum, identified by appendiceal                         orifice and ileocecal valve. The colonoscopy was                         performed without difficulty. The patient tolerated                         the procedure well. The quality of the bowel                         preparation was evaluated using the BBPS [facility] Bowel                         Preparation Scale) with scores of: Right Colon = 3,                         Transverse Colon = 3 and Left Colon = 3 (entire mucosa                         seen well with no  residual staining, small fragments                         of stool or opaque liquid). The total BBPS score                         equals 9. Findings:      Four sessile polyps were found in the descending colon, transverse colon       and cecum. The polyps were 5 to 8 mm in size. These polyps were removed       with a cold snare. Resection and retrieval were complete. Estimated       blood loss was minimal.      A 10 mm polyp was found in the sigmoid colon. The polyp was       semi-pedunculated. The polyp was removed with a hot snare. Resection and       retrieval were complete. Estimated blood loss: none.      Multiple diverticula were found in the sigmoid colon.      Non-bleeding external and internal hemorrhoids were found during       retroflexion. The hemorrhoids were medium-sized.      Skin tags were found on perianal exam. Impression:            - Four 5 to 8 mm polyps in the descending colon, in                         the transverse colon and in the cecum, removed with a                         cold snare. Resected and retrieved.                        - One 10 mm polyp  in the sigmoid colon, removed with a                         hot snare. Resected and retrieved.                        - Diverticulosis in the sigmoid colon.                        - Non-bleeding external and internal hemorrhoids.                        - Perianal skin tags found on perianal exam. Recommendation:        - Discharge patient to home (with escort).                        - Resume previous diet today.                        - Continue present medications.                        - Await pathology results.                        - Repeat colonoscopy in 3 - 5 years for surveillance                         of multiple polyps. Procedure Code(s):     --- Professional ---                        [phone], Colonoscopy, flexible; with removal of                         tumor(s), polyp(s), or other lesion(s) by  snare                         technique Diagnosis Code(s):     --- Professional ---                        K63.5, Polyp of colon                        Z12.11, Encounter for screening for malignant neoplasm                         of colon                        K64.8, Other hemorrhoids                        K64.4, Residual hemorrhoidal skin tags                        K57.30, Diverticulosis of large intestine without                         perforation or abscess without bleeding CPT copyright 2019 American Medical Association. All rights reserved. The codes documented in this report are preliminary and upon  coder review may  be revised to meet current compliance requirements. Dr. Ulyess Mort Lin Landsman MD, MD 08/14/2019 9:48:47 AM This report has been signed electronically. Number of Addenda: 0 Note Initiated On: 08/14/2019 9:16 AM Scope Withdrawal Time: 0 hours 20 minutes 10 seconds  Total Procedure Duration: 0 hours 22 minutes 22 seconds  Estimated Blood Loss:  Estimated blood loss was minimal.      [facility]

## 2019-08-14 NOTE — Anesthesia Procedure Notes (Signed)
Performed by: Caedyn, Ethie Curless, CRNA Pre-anesthesia Checklist: Patient identified, Emergency Drugs available, Suction available and Patient being monitored Patient Re-evaluated:Patient Re-evaluated prior to induction Oxygen Delivery Method: Nasal cannula Induction Type: IV induction Dental Injury: Teeth and Oropharynx as per pre-operative assessment  Comments: Nasal cannula with etCO2 monitoring       

## 2019-08-14 NOTE — Anesthesia Preprocedure Evaluation (Signed)
Anesthesia Evaluation  Patient identified by [name], date of birth, ID band Patient awake    Reviewed: Allergy & Precautions, NPO status , Patient's Chart, lab work & pertinent test results  Airway Mallampati: III       Dental   Pulmonary former smoker,           Cardiovascular hypertension,      Neuro/Psych  Headaches, negative psych ROS   GI/Hepatic Neg liver ROS,   Endo/Other  Hypothyroidism   Renal/GU negative Renal ROS  negative genitourinary   Musculoskeletal  (+) Arthritis ,   Abdominal   Peds negative pediatric ROS (+)  Hematology  (+) anemia ,   Anesthesia Other Findings   Reproductive/Obstetrics                             Anesthesia Physical Anesthesia Plan  ASA: II  Anesthesia Plan: General   Post-op Pain Management:    Induction: Intravenous  PONV Risk Score and Plan: Propofol infusion  Airway Management Planned: Nasal Cannula  Additional Equipment:   Intra-op Plan:   Post-operative Plan:   Informed Consent: I have reviewed the patients History and Physical, chart, labs and discussed the procedure including the risks, benefits and alternatives for the proposed anesthesia with the patient or authorized representative who has indicated his/her understanding and acceptance.     Dental advisory given  Plan Discussed with: CRNA and Surgeon  Anesthesia Plan Comments:         Anesthesia Quick Evaluation

## 2019-08-14 NOTE — Transfer of Care (Signed)
Immediate Anesthesia Transfer of Care Note  Patient: Alan Campbell  Procedure(s) Performed: Procedure(s) with comments: COLONOSCOPY WITH PROPOFOL (N/A) - PRIORITY 4  Patient Location: PACU and Endoscopy Unit  Anesthesia Type:General  Level of Consciousness: sedated  Airway & Oxygen Therapy: Patient Spontanous Breathing and Patient connected to nasal cannula oxygen  Post-op Assessment: Report given to RN and Post -op Vital signs reviewed and stable  Post vital signs: Reviewed and stable  Last Vitals:  Vitals:   08/14/19 0855 08/14/19 0952  BP: 120/75 (!) 96/55  Pulse: 64   Resp: 18 14  Temp: (!) 36.1 C (!) 36.4 C  SpO2: [id number]    Complications: No apparent anesthesia complications

## 2019-08-14 NOTE — Anesthesia Postprocedure Evaluation (Signed)
Anesthesia Post Note  Patient: Alan Campbell  Procedure(s) Performed: COLONOSCOPY WITH PROPOFOL (N/A )  Patient location during evaluation: Endoscopy Anesthesia Type: General Level of consciousness: awake and alert and oriented Pain management: pain level controlled Vital Signs Assessment: post-procedure vital signs reviewed and stable Respiratory status: spontaneous breathing, nonlabored ventilation and respiratory function stable Cardiovascular status: blood pressure returned to baseline and stable Postop Assessment: no signs of nausea or vomiting Anesthetic complications: no     Last Vitals:  Vitals:   08/14/19 1000 08/14/19 1010  BP: (!) 106/55 110/67  Pulse: (!) 53 (!) 54  Resp: 13 15  Temp:    SpO2: 99% 99%    Last Pain:  Vitals:   08/14/19 1010  TempSrc:   PainSc: 0-No pain                 Alan Campbell

## 2019-08-17 ENCOUNTER — Encounter: Payer: Self-pay | Admitting: Gastroenterology

## 2019-08-17 LAB — SURGICAL PATHOLOGY

## 2019-09-01 ENCOUNTER — Other Ambulatory Visit: Payer: Self-pay | Admitting: Family Medicine

## 2019-09-01 DIAGNOSIS — I10 Essential (primary) hypertension: Secondary | ICD-10-CM

## 2019-09-15 DIAGNOSIS — M7750 Other enthesopathy of unspecified foot: Secondary | ICD-10-CM | POA: Insufficient documentation

## 2019-09-23 ENCOUNTER — Other Ambulatory Visit: Payer: Self-pay

## 2019-09-23 ENCOUNTER — Ambulatory Visit (INDEPENDENT_AMBULATORY_CARE_PROVIDER_SITE_OTHER): Payer: Medicare Other

## 2019-09-23 DIAGNOSIS — Z136 Encounter for screening for cardiovascular disorders: Secondary | ICD-10-CM | POA: Diagnosis not present

## 2019-09-23 DIAGNOSIS — Z87891 Personal history of nicotine dependence: Secondary | ICD-10-CM

## 2019-09-23 DIAGNOSIS — Z0000 Encounter for general adult medical examination without abnormal findings: Secondary | ICD-10-CM

## 2019-09-25 ENCOUNTER — Telehealth: Payer: Self-pay

## 2019-09-25 NOTE — Telephone Encounter (Signed)
Copied from [place] [phone]. Topic: General - Call Back - No Documentation >> Sep 24, 2019 10:54 AM Yvette Rack wrote: Reason for CRM: Pt stated he was returning call from a missed call he received. Cb# [phone]

## 2019-10-01 ENCOUNTER — Ambulatory Visit (INDEPENDENT_AMBULATORY_CARE_PROVIDER_SITE_OTHER): Payer: Medicare Other | Admitting: Family Medicine

## 2019-10-01 ENCOUNTER — Other Ambulatory Visit: Payer: Self-pay

## 2019-10-01 ENCOUNTER — Encounter: Payer: Self-pay | Admitting: Family Medicine

## 2019-10-01 VITALS — BP 120/72 | HR 78 | Temp 97.9°F | Resp 16 | Ht 70.0 in | Wt 247.1 lb

## 2019-10-01 DIAGNOSIS — I10 Essential (primary) hypertension: Secondary | ICD-10-CM | POA: Diagnosis not present

## 2019-10-01 DIAGNOSIS — E291 Testicular hypofunction: Secondary | ICD-10-CM

## 2019-10-01 DIAGNOSIS — E8881 Metabolic syndrome: Secondary | ICD-10-CM | POA: Diagnosis not present

## 2019-10-01 DIAGNOSIS — R399 Unspecified symptoms and signs involving the genitourinary system: Secondary | ICD-10-CM

## 2019-10-01 DIAGNOSIS — Z79899 Other long term (current) drug therapy: Secondary | ICD-10-CM

## 2019-10-01 DIAGNOSIS — E785 Hyperlipidemia, unspecified: Secondary | ICD-10-CM

## 2019-10-01 DIAGNOSIS — E039 Hypothyroidism, unspecified: Secondary | ICD-10-CM

## 2019-10-01 DIAGNOSIS — Z125 Encounter for screening for malignant neoplasm of prostate: Secondary | ICD-10-CM

## 2019-10-01 DIAGNOSIS — R739 Hyperglycemia, unspecified: Secondary | ICD-10-CM

## 2019-10-01 DIAGNOSIS — E559 Vitamin D deficiency, unspecified: Secondary | ICD-10-CM

## 2019-10-01 DIAGNOSIS — R972 Elevated prostate specific antigen [PSA]: Secondary | ICD-10-CM

## 2019-10-01 NOTE — Progress Notes (Signed)
Name: Alan Campbell   [name]: [id number]    DOB: 01-18-1951   Date:10/01/2019       Progress Note  Subjective  Chief Complaint  Chief Complaint  Patient presents with  . Follow-up  . Hypertension  . Hyperlipidemia  . Hypothyroidism    HPI  Hypothyroidism: he is on levothyroxine 150 mcg six days a week,  last TSH was at goal. No change in bowel movements, hair loss  or dry skin.  Hypogonadism: last PSA was normal 10/2018 , recheck next month  Last level was at goal . He states two pumps seems to be good for him in terms of symptom control   Metabolic syndrome: he was on Metformin for many years, but still had insulin resistance and weight was trending up, he has been on GLP agonistssince Spring 2018. He lost weight but has been off medication secondary to cost around January 2021  His weight has been stable since.   HTN: taking medication daily and denies side effects,EKG was abnormalseen by Dr. Clayborn Bigness and had stress test and echo done 07/2017, mild right ventricular enlargement otherwise normal, he occasionally snores, deniesdecreased in exercise tolerance. He denies dizziness, chest pain or palpitation. BP during colonoscopy was low and also bradycardia, at goal today   Dyslipidemia: reviewed labs . HDL improved and LDL is at goal, recheck labs in May   OA both knees: going to [facility], not on medication. He states pain is constant, dull and aching with activity. He was on vacation last month and walked more than usual   Polypectomy: needs to repeat colonoscopy in 3 years.   Tendinitis left foot: going to [facility] and was given prednisone taper and a muscle relaxer and that has improved. It flare after walking while on vacation.   Patient Active Problem List   Diagnosis Date Noted  . Colon cancer screening   . Epididymal cyst 06/24/2016  . Anemia, iron deficiency 01/16/2016  . Benign essential HTN 12/30/2014  . Dyslipidemia 12/30/2014  . Adult hypothyroidism  12/30/2014  . Dysmetabolic syndrome [id number]  . Obesity (BMI 30-39.9) 12/30/2014  . Arthritis, degenerative 12/30/2014  . Perennial allergic rhinitis with seasonal variation 12/30/2014  . 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  . Patella-femoral syndrome 12/03/2014  . Low back pain 04/03/2010  . Vitamin D deficiency 04/14/2009  . Abnormal presence of protein in urine 09/12/2007  . OP (osteoporosis) 05/05/2007    Past Surgical History:  Procedure Laterality Date  . BACK SURGERY    . COLONOSCOPY WITH PROPOFOL N/A 08/14/2019   Procedure: COLONOSCOPY WITH PROPOFOL;  Surgeon: Lin Landsman, MD;  Location: [facility] ENDOSCOPY;  Service: Gastroenterology;  Laterality: N/A;  PRIORITY 4  . EYE SURGERY  2016  . LAMINECTOMY  1985   L-5  . SPINE SURGERY  1985    Family History  Problem Relation Age of Onset  . Heart disease Father   . Early death Father   . Hypothyroidism Sister   . Thyroid disease Sister   . Hypothyroidism Sister   . Migraines Sister   . Prostate cancer Neg Hx   . Bladder Cancer Neg Hx   . Kidney cancer Neg Hx     Social History   Tobacco Use  . Smoking status: Former Smoker    Packs/day: 0.50    Years: 5.00    Pack years: 2.50    Types: Cigarettes    Quit date: 10/03/1970    Years since quitting: 49.0  . Smokeless tobacco: Never Used  .  Tobacco comment: Quit 1972  Substance Use Topics  . Alcohol use: Yes    Alcohol/week: 4.0 standard drinks    Types: 4 Standard drinks or equivalent per week     Current Outpatient Medications:  .  amLODipine (NORVASC) 10 MG tablet, Take 1 tablet (10 mg total) by mouth daily., Disp: 90 tablet, Rfl: 1 .  Cholecalciferol (VITAMIN D) 2000 units CAPS, Take 1 capsule (2,000 Units total) by mouth daily., Disp: 30 capsule, Rfl: 0 .  diltiazem (DILACOR XR) 240 MG 24 hr capsule, Take 1 capsule (240 mg total) by mouth daily., Disp: 90 capsule, Rfl: 0 .  furosemide (LASIX) 20 MG tablet, Take 1 tablet by mouth daily as needed., Disp:  , Rfl:  .  levothyroxine (SYNTHROID) 150 MCG tablet, Take 1 tablet (150 mcg total) by mouth daily., Disp: 90 tablet, Rfl: 1 .  Multiple Vitamins-Minerals (CENTRUM SILVER 50+MEN) TABS, Take 1 tablet by mouth daily., Disp: , Rfl:  .  OMEGA-3 FATTY ACIDS PO, Take 1 tablet by mouth daily., Disp: , Rfl:  .  rosuvastatin (CRESTOR) 20 MG tablet, Take 1 tablet (20 mg total) by mouth daily., Disp: 90 tablet, Rfl: 1 .  Testosterone 20.25 MG/ACT (1.62%) GEL, Apply 2 each topically daily. 2 pumps daily, Disp: 75 g, Rfl: 5 .  triamterene-hydrochlorothiazide (MAXZIDE-25) 37.5-25 MG tablet, Take 1 tablet by mouth daily., Disp: 90 tablet, Rfl: 1 .  valsartan (DIOVAN) 320 MG tablet, Take 1 tablet (320 mg total) by mouth daily., Disp: 90 tablet, Rfl: 1  No Known Allergies  I personally reviewed active problem list, medication list, allergies, family history, social history, health maintenance with the patient/caregiver today.   ROS  Constitutional: Negative for fever or weight change.  Respiratory: Negative for cough and shortness of breath.   Cardiovascular: Negative for chest pain or palpitations.  Gastrointestinal: Negative for abdominal pain, no bowel changes.  Musculoskeletal: Negative for gait problem or joint swelling.  Skin: Negative for rash.  Neurological: Negative for dizziness or headache.  No other specific complaints in a complete review of systems (except as listed in HPI above).  Objective  Vitals:   10/01/19 0820  BP: 120/72  Pulse: 78  Resp: 16  Temp: 97.9 F (36.6 C)  TempSrc: Temporal  SpO2: 98%  Weight: 247 lb 1.6 oz (112.1 kg)  Height: 5\' 10"  (1.778 m)    Body mass index is 35.46 kg/m.  Physical Exam  Constitutional: Patient appears well-developed and well-nourished. Obese  No distress.  HEENT: head atraumatic, normocephalic, pupils equal and reactive to light,  neck supple Cardiovascular: Normal rate, regular rhythm and normal heart sounds.  No murmur heard. Trace   BLE edema. Pulmonary/Chest: Effort normal and breath sounds normal. No respiratory distress. Abdominal: Soft.  There is no tenderness. Psychiatric: Patient has a normal mood and affect. behavior is normal. Judgment and thought content normal.  Recent Results (from the past 2160 hour(s))  TSH     Status: None   Collection Time: 07/23/19 11:11 AM  Result Value Ref Range   TSH 1.97 0.40 - 4.50 mIU/L  SARS CORONAVIRUS 2 (TAT 6-24 HRS) Nasopharyngeal Nasopharyngeal Swab     Status: None   Collection Time: 08/12/19 11:35 AM   Specimen: Nasopharyngeal Swab  Result Value Ref Range   SARS Coronavirus 2 NEGATIVE NEGATIVE    Comment: (NOTE) SARS-CoV-2 target nucleic acids are NOT DETECTED. The SARS-CoV-2 RNA is generally detectable in upper and lower respiratory specimens during the acute phase of infection. Negative results  do not preclude SARS-CoV-2 infection, do not rule out co-infections with other pathogens, and should not be used as the sole basis for treatment or other patient management decisions. Negative results must be combined with clinical observations, patient history, and epidemiological information. The expected result is Negative. Fact Sheet for Patients: SugarRoll.be Fact Sheet for Healthcare Providers: https://www.woods-mathews.com/ This test is not yet approved or cleared by the [place] FDA and  has been authorized for detection and/or diagnosis of SARS-CoV-2 by FDA under an Emergency Use Authorization (EUA). This EUA will remain  in effect (meaning this test can be used) for the duration of the COVID-19 declaration under Section 56 4(b)(1) of the Act, 21 U.S.C. section 360bbb-3(b)(1), unless the authorization is terminated or revoked sooner. Performed at [facility], [employer] [address], [place], [place] [address]   Surgical pathology     Status: None   Collection Time: 08/14/19  9:26 AM  Result Value Ref Range    SURGICAL PATHOLOGY      SURGICAL PATHOLOGY CASE: [id number] PATIENT: Sheliah Hatch Surgical Pathology Report     Specimen Submitted: A. Colon polyp, cecum; cold snare B. Colon polyps x2, transverse; cold snare C. Colon polyp, descending; cold snare D. Colon polyp, sigmoid; hot snare  Clinical History: Screening colonoscopy. Colon polyps; Diverticulosis; Internal hemorrhoids.      DIAGNOSIS: A.  COLON POLYP, CECUM; COLD SNARE: - TUBULAR ADENOMA. - NEGATIVE FOR HIGH-GRADE DYSPLASIA AND MALIGNANCY.  B.  COLON POLYPS X2, TRANSVERSE; COLD SNARE: - TUBULAR ADENOMA (FRAGMENTS). - NEGATIVE FOR HIGH-GRADE DYSPLASIA AND MALIGNANCY.  C.  COLON POLYP, DESCENDING; COLD SNARE: - HYPERPLASTIC POLYP. - NEGATIVE FOR DYSPLASIA AND MALIGNANCY.  D.  COLON POLYP, SIGMOID; HOT SNARE: - TUBULAR ADENOMA. - NEGATIVE FOR HIGH-GRADE DYSPLASIA AND MALIGNANCY.   GROSS DESCRIPTION: A. Labeled: Cold snare polyp cecum Received: Formalin Tissue fragment(s): Multiple Size: Aggregate, 1.0 x 0.4 x 0.1  cm Description: Tan soft tissue fragments admixed with fecal material Entirely submitted in 1 cassette.  B. Labeled: Cold snare polyp transverse colon x2 Received: Formalin Tissue fragment(s): Multiple Size: Aggregate, 1.1 x 0.7 x 0.2 cm Description: Tan soft tissue fragments admixed with fecal material Entirely submitted in 1 cassette.  C. Labeled: Cold snare polyp descending colon Received: Formalin Tissue fragment(s): Multiple Size: Aggregate, 0.8 x 0.5 x 0.2 cm Description: Tan soft tissue fragments admixed with fecal material Entirely submitted in 1 cassette.  D. Labeled: Hot snare polyp sigmoid colon Received: Formalin Tissue fragment(s): Multiple Size: Aggregate, 1.2 x 0.7 x 0.4 cm Description: Tan soft tissue fragments admixed with fecal material Entirely submitted in 1 cassette.    Final Diagnosis performed by Quay Burow, MD.   Electronically signed 08/17/2019  10:25:49AM The electronic signature indicates that the named Attending Pathologist has  evaluated the specimen Technical component performed at [facility], [address], [place], [place] [address] Lab: [id number] Dir: Rush Farmer, MD, MMM  Professional component performed at [facility], [facility], [place], [place], [place] [address] Lab: [phone] Dir: Dellia Nims. Rubinas, MD      PHQ2/9: Depression screen [facility] 2/9 10/01/2019 07/23/2019 06/02/2019 04/10/2019 02/25/2019  Decreased Interest 0 0 0 0 0  Down, Depressed, Hopeless 0 0 0 0 0  PHQ - 2 Score 0 0 0 0 0  Altered sleeping 0 0 0 0 0  Tired, decreased energy 0 0 0 0 0  Change in appetite 0 0 0 0 0  Feeling bad or failure about yourself  0 0 0  0 0  Trouble concentrating 0 0 0 0 0  Moving slowly or fidgety/restless 0 0 0 0 0  Suicidal thoughts 0 0 0 0 0  PHQ-9 Score 0 0 0 0 0  Difficult doing work/chores Not difficult at all - - Not difficult at all Not difficult at all  Some recent data might be hidden    phq 9 is negative   Fall Risk: Fall Risk  10/01/2019 07/23/2019 06/02/2019 04/10/2019 02/25/2019  Falls in the past year? 0 0 0 0 0  Number falls in past yr: 0 0 0 0 0  Injury with Fall? 0 0 0 0 0  Follow up Falls evaluation completed - - - -     Functional Status Survey: Is the patient deaf or have difficulty hearing?: No Does the patient have difficulty seeing, even when wearing glasses/contacts?: No Does the patient have difficulty concentrating, remembering, or making decisions?: No Does the patient have difficulty walking or climbing stairs?: No Does the patient have difficulty dressing or bathing?: No Does the patient have difficulty doing errands alone such as visiting a doctor's office or shopping?: No   Assessment & Plan  1. Dyslipidemia  - Lipid panel  2. Benign essential HTN  - CBC with Differential/Platelet - COMPLETE METABOLIC PANEL WITH GFR  3. Hypothyroidism, adult  -  TSH  4. Dysmetabolic syndrome   5. Vitamin D deficiency  Continue supplementation   6. Hypogonadism in male  - Testosterone Total,Free,Bio, Males - PSA  7. High risk medication use  - PSA  8. Lower urinary tract symptoms (LUTS)  - PSA  9. Hyperglycemia  - Hemoglobin A1c  10. Prostate cancer screening  - PSA  11. Increased prostate specific antigen (PSA) velocity  - PSA

## 2019-10-05 ENCOUNTER — Other Ambulatory Visit: Payer: Self-pay

## 2019-10-05 ENCOUNTER — Encounter (INDEPENDENT_AMBULATORY_CARE_PROVIDER_SITE_OTHER): Payer: Self-pay | Admitting: Ophthalmology

## 2019-10-05 ENCOUNTER — Ambulatory Visit (INDEPENDENT_AMBULATORY_CARE_PROVIDER_SITE_OTHER): Payer: Medicare Other | Admitting: Ophthalmology

## 2019-10-05 DIAGNOSIS — H43811 Vitreous degeneration, right eye: Secondary | ICD-10-CM

## 2019-10-05 DIAGNOSIS — H353111 Nonexudative age-related macular degeneration, right eye, early dry stage: Secondary | ICD-10-CM | POA: Diagnosis not present

## 2019-10-05 DIAGNOSIS — H35371 Puckering of macula, right eye: Secondary | ICD-10-CM

## 2019-10-05 DIAGNOSIS — H2512 Age-related nuclear cataract, left eye: Secondary | ICD-10-CM

## 2019-10-05 DIAGNOSIS — H43822 Vitreomacular adhesion, left eye: Secondary | ICD-10-CM | POA: Insufficient documentation

## 2019-10-05 HISTORY — DX: Vitreomacular adhesion, left eye: H43.822

## 2019-10-05 NOTE — Progress Notes (Signed)
10/05/2019     CHIEF COMPLAINT Patient presents for Retina Follow Up   HISTORY OF PRESENT ILLNESS: Alan Campbell is a 69 y.o. male who presents to the clinic today for:   HPI    Retina Follow Up    Patient presents with  Dry AMD.  In right eye.  Duration of 8 months.  Since onset it is stable.          Comments    8 month follow up - OCT OU Patient denies change in vision and overall has no complaints.  History of epiretinal membrane in the right eye and insignificant nonpathologic vitreal macular adhesion OS        Last edited by Hurman Horn, MD on 10/05/2019  2:29 PM. (History)      Referring physician: [name], MD [address] [place],  [place] [address]  HISTORICAL INFORMATION:   Selected notes from the MEDICAL RECORD NUMBER    Lab Results  Component Value Date   HGBA1C 4.6 06/02/2019     CURRENT MEDICATIONS: No current outpatient medications on file. (Ophthalmic Drugs)   No current facility-administered medications for this visit. (Ophthalmic Drugs)   Current Outpatient Medications (Other)  Medication Sig  . amLODipine (NORVASC) 10 MG tablet Take 1 tablet (10 mg total) by mouth daily.  . Cholecalciferol (VITAMIN D) 2000 units CAPS Take 1 capsule (2,000 Units total) by mouth daily.  Marland Kitchen diltiazem (DILACOR XR) 240 MG 24 hr capsule Take 1 capsule (240 mg total) by mouth daily.  . furosemide (LASIX) 20 MG tablet Take 1 tablet by mouth daily as needed.  Marland Kitchen levothyroxine (SYNTHROID) 150 MCG tablet Take 1 tablet (150 mcg total) by mouth daily.  . Multiple Vitamins-Minerals (CENTRUM SILVER 50+MEN) TABS Take 1 tablet by mouth daily.  . OMEGA-3 FATTY ACIDS PO Take 1 tablet by mouth daily.  . rosuvastatin (CRESTOR) 20 MG tablet Take 1 tablet (20 mg total) by mouth daily.  . Testosterone 20.25 MG/ACT (1.62%) GEL Apply 2 each topically daily. 2 pumps daily  . triamterene-hydrochlorothiazide (MAXZIDE-25) 37.5-25 MG tablet Take 1 tablet by mouth daily.   . valsartan (DIOVAN) 320 MG tablet Take 1 tablet (320 mg total) by mouth daily.   No current facility-administered medications for this visit. (Other)      REVIEW OF SYSTEMS:    ALLERGIES No Known Allergies  PAST MEDICAL HISTORY Past Medical History:  Diagnosis Date  . Allergy   . Anemia, iron deficiency   . Cataract   . Hyperlipidemia   . Hypertension   . Low back pain   . Migraine   . Obesity   . OP (osteoporosis)   . Thyroid disease   . Vitamin D deficiency    Past Surgical History:  Procedure Laterality Date  . BACK SURGERY    . COLONOSCOPY WITH PROPOFOL N/A 08/14/2019   Procedure: COLONOSCOPY WITH PROPOFOL;  Surgeon: Lin Landsman, MD;  Location: [facility] ENDOSCOPY;  Service: Gastroenterology;  Laterality: N/A;  PRIORITY 4  . EYE SURGERY  2016  . LAMINECTOMY  1985   L-5  . SPINE SURGERY  1985    FAMILY HISTORY Family History  Problem Relation Age of Onset  . Heart disease Father   . Early death Father   . Hypothyroidism Sister   . Thyroid disease Sister   . Hypothyroidism Sister   . Migraines Sister   . Prostate cancer Neg Hx   . Bladder Cancer Neg Hx   .  Kidney cancer Neg Hx     SOCIAL HISTORY Social History   Tobacco Use  . Smoking status: Former Smoker    Packs/day: 0.50    Years: 5.00    Pack years: 2.50    Types: Cigarettes    Quit date: 10/03/1970    Years since quitting: 49.0  . Smokeless tobacco: Never Used  . Tobacco comment: Quit 1972  Substance Use Topics  . Alcohol use: Yes    Alcohol/week: 4.0 standard drinks    Types: 4 Standard drinks or equivalent per week  . Drug use: No         OPHTHALMIC EXAM:  Base Eye Exam    Visual Acuity (Snellen - Linear)      Right Left   Dist [place] 20/20-2 20/70-2   Dist ph [place]  20/30+2       Tonometry (Tonopen, 1:29 PM)      Right Left   Pressure 19 15       Pupils      Pupils Dark Light Shape React APD   Right PERRL 3 2 Round Slow None   Left PERRL 3 2 Round Slow None        Visual Fields (Counting fingers)      Left Right    Full Full       Extraocular Movement      Right Left    Full Full       Neuro/Psych    Oriented x3: Yes   Mood/Affect: Normal       Dilation    Both eyes: 1.0% Mydriacyl, 2.5% Phenylephrine @ 1:29 PM        Slit Lamp and Fundus Exam    External Exam      Right Left   External Normal Normal       Slit Lamp Exam      Right Left   Lids/Lashes Normal Normal   Conjunctiva/Sclera White and quiet White and quiet   Cornea Clear Clear   Anterior Chamber Deep and quiet Deep and quiet   Iris Round and reactive Round and reactive   Lens Posterior chamber intraocular lens    Anterior Vitreous Normal Normal       Fundus Exam      Right Left   Posterior Vitreous Normal Normal   Disc Normal Normal   C/D Ratio 0.2 0.2   Macula Epiretinal membrane, no macular thickening Normal, no macular thickening   Vessels Normal Normal   Periphery Normal Normal          IMAGING AND PROCEDURES  Imaging and Procedures for @TODAY @  OCT, Retina - OU - Both Eyes       Right Eye Quality was good. Central Foveal Thickness: 393. Progression has been stable. Findings include epiretinal membrane, abnormal foveal contour.   Left Eye Quality was good. Scan locations included subfoveal. Findings include normal observations, vitreomacular adhesion , normal foveal contour.   Notes OD, no topographic distortion and no visual acuity intact from epiretinal membrane, will monitor and observe, OS with minor vitreal macular adhesion, with no interfoveal distortion.                ASSESSMENT/PLAN:  Right epiretinal membrane The nature of macular pucker (epiretinal membrane ERM) was discussed with the patient as well as threshold criteria for vitrectomy surgery. I explained that in rare cases another surgery is needed to actually remove a second wrinkle should it regrow.  Most often, the epiretinal membrane and underlying  wrinkled internal  limiting membrane are removed with the first surgery, to accomplish the goals.   If the operative eye is [place] (natural lens still present), cataract surgery is often recommended prior to Vitrectomy. This will enable the retina surgeon to have the best view during surgery and the patient to obtain optimal results in the future. Treatment options were discussed.  Posterior vitreous detachment of right eye   The nature of posterior vitreous detachment was discussed with the patient as well as its physiology, its age prevalence, and its possible implication regarding retinal breaks and detachment.  An informational brochure was given to the patient.  All the patient's questions were answered.  The patient was asked to return if new or different flashes or floaters develops.   Patient was instructed to contact office immediately if any changes were noticed. I explained to the patient that vitreous inside the eye is similar to jello inside a bowl. As the jello melts it can start to pull away from the bowl, similarly the vitreous throughout our lives can begin to pull away from the retina. That process is called a posterior vitreous detachment. In some cases, the vitreous can tug hard enough on the retina to form a retinal tear. I discussed with the patient the signs and symptoms of a retinal detachment.  Do not rub the eye.  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 The results of the AREDS 2 study was discussed with the patient. A diet rich in dark leafy green vegetables was advised and specific recommendations were made regarding supplements with AREDS 2 formulation . Control of hypertension and serum cholesterol may slow the disease. Smoking cessation is mandatory to slow the disease and diminish the risk of progressing to wet age related macular  degeneration. The patient was instructed in the use of an [place] and was told to return immediately for any changes in the Grid. Stressed to the patient do not rub eyes      ICD-10-CM   1. Right epiretinal membrane  H35.371 OCT, Retina - OU - Both Eyes  2. Posterior vitreous detachment of right eye  H43.811 OCT, Retina - OU - Both Eyes  3. Nuclear sclerotic cataract of left eye  H25.12   4. 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 Vitreomacular adhesion of left eye  H43.822     1.  2.  3.  Ophthalmic Meds Ordered this visit:  No orders of the defined types were placed in this encounter.      Return in about 1 year (around 10/04/2020) for DILATE OU, OCT.  There are no Patient Instructions on file for this visit.   Explained the diagnoses, plan, and follow up with the patient and they expressed understanding.  Patient expressed understanding of the importance of proper follow up care.   Clent Demark Adylene Dlugosz M.D. Diseases & Surgery of the Retina and Vitreous Retina [facility] Diabetic [place] @TODAY @     Abbreviations: M myopia (nearsighted); A astigmatism; H hyperopia (farsighted); P presbyopia; Mrx spectacle prescription;  CTL contact lenses; OD right eye; OS left eye; OU both eyes  XT exotropia; ET esotropia; PEK punctate epithelial keratitis; PEE punctate epithelial erosions; DES dry eye syndrome; MGD meibomian gland dysfunction; ATs artificial tears; PFAT's preservative free artificial tears; [place]  nuclear sclerotic cataract; PSC posterior subcapsular cataract; ERM epi-retinal membrane; PVD posterior vitreous detachment; RD retinal detachment; DM diabetes mellitus; DR diabetic retinopathy; NPDR non-proliferative diabetic retinopathy; PDR proliferative diabetic retinopathy; CSME clinically significant macular edema; DME diabetic macular edema; dbh dot blot hemorrhages; CWS cotton wool spot; POAG primary open angle  glaucoma; C/D cup-to-disc ratio; HVF humphrey visual field; GVF goldmann visual field; OCT optical coherence tomography; IOP intraocular pressure; BRVO Branch retinal vein occlusion; CRVO central retinal vein occlusion; CRAO central retinal artery occlusion; BRAO branch retinal artery occlusion; RT retinal tear; SB scleral buckle; PPV pars plana vitrectomy; VH Vitreous hemorrhage; PRP panretinal laser photocoagulation; IVK intravitreal kenalog; VMT vitreomacular traction; MH Macular hole;  NVD neovascularization of the disc; NVE neovascularization elsewhere; AREDS age related eye disease study; ARMD age related macular degeneration; POAG primary open angle glaucoma; EBMD epithelial/anterior basement membrane dystrophy; ACIOL anterior chamber intraocular lens; IOL intraocular lens; PCIOL posterior chamber intraocular lens; Phaco/IOL phacoemulsification with intraocular lens placement; [place] photorefractive keratectomy; LASIK laser assisted in situ keratomileusis; HTN hypertension; DM diabetes mellitus; COPD chronic obstructive pulmonary disease

## 2019-10-05 NOTE — Assessment & Plan Note (Signed)

## 2019-10-05 NOTE — Assessment & Plan Note (Signed)

## 2019-10-05 NOTE — Assessment & Plan Note (Signed)

## 2020-01-13 ENCOUNTER — Other Ambulatory Visit: Payer: Self-pay

## 2020-01-13 ENCOUNTER — Ambulatory Visit (INDEPENDENT_AMBULATORY_CARE_PROVIDER_SITE_OTHER): Payer: Medicare Other | Admitting: Dermatology

## 2020-01-13 ENCOUNTER — Encounter: Payer: Self-pay | Admitting: Dermatology

## 2020-01-13 DIAGNOSIS — D229 Melanocytic nevi, unspecified: Secondary | ICD-10-CM

## 2020-01-13 DIAGNOSIS — L813 Cafe au lait spots: Secondary | ICD-10-CM | POA: Diagnosis not present

## 2020-01-13 DIAGNOSIS — L719 Rosacea, unspecified: Secondary | ICD-10-CM | POA: Diagnosis not present

## 2020-01-13 DIAGNOSIS — L821 Other seborrheic keratosis: Secondary | ICD-10-CM

## 2020-01-13 DIAGNOSIS — L711 Rhinophyma: Secondary | ICD-10-CM

## 2020-01-13 DIAGNOSIS — Z1283 Encounter for screening for malignant neoplasm of skin: Secondary | ICD-10-CM

## 2020-01-13 DIAGNOSIS — L578 Other skin changes due to chronic exposure to nonionizing radiation: Secondary | ICD-10-CM

## 2020-01-13 DIAGNOSIS — D1800 Hemangioma unspecified site: Secondary | ICD-10-CM

## 2020-01-13 DIAGNOSIS — L814 Other melanin hyperpigmentation: Secondary | ICD-10-CM

## 2020-01-13 DIAGNOSIS — L820 Inflamed seborrheic keratosis: Secondary | ICD-10-CM

## 2020-01-13 DIAGNOSIS — L918 Other hypertrophic disorders of the skin: Secondary | ICD-10-CM

## 2020-01-13 NOTE — Progress Notes (Signed)
   Follow-Up Visit   Subjective  Alan Campbell is a 69 y.o. male who presents for the following: TBSE. The patient presents for Total-Body Skin Exam (TBSE) for skin cancer screening and mole check. Patient presents today for annual TBSE, patient does have a few areas of concern one near his left nipple and other is behind right ear. Patient does not have skin cancer hx  The following portions of the chart were reviewed this encounter and updated as appropriate:  Tobacco  Allergies  Meds  Problems  Med Hx  Surg Hx  Fam Hx     Review of Systems:  No other skin or systemic complaints except as noted in HPI or Assessment and Plan.  Objective  Well appearing patient in no apparent distress; mood and affect are within normal limits.  A full examination was performed including scalp, head, eyes, ears, nose, lips, neck, chest, axillae, abdomen, back, buttocks, bilateral upper extremities, bilateral lower extremities, hands, feet, fingers, toes, fingernails, and toenails. All findings within normal limits unless otherwise noted below.  Objective  Left Pectoral Areola x 2 (2), Right Scalp Supra Auricular x 2 (2): Erythematous keratotic or waxy stuck-on papule or plaque.   Objective  Left Medial Thigh: Brown macule  Objective  Trunk: Fleshy, skin-colored pedunculated papules.     Assessment & Plan  Inflamed seborrheic keratosis (4) Left Pectoral Areola x 2 (2); Right Scalp Supra Auricular x 2 (2)  Cryotherapy today Prior to procedure, discussed risks of blister formation, small wound, skin dyspigmentation, or rare scar following cryotherapy.    Destruction of lesion - Left Pectoral Areola x 2, Right Scalp Supra Auricular x 2 Complexity: simple   Destruction method: cryotherapy   Informed consent: discussed and consent obtained   Timeout:  patient name, date of birth, surgical site, and procedure verified Lesion destroyed using liquid nitrogen: Yes   Region frozen until ice  ball extended beyond lesion: Yes   Outcome: patient tolerated procedure well with no complications   Post-procedure details: wound care instructions given    Rosacea Mid Face  With Rhinophyma Discussed Laser treatment  Cafe au lait spots Left Medial Thigh Birthmarks Benign, observe.   Skin tag Trunk Benign, observe.    Lentigines - Scattered tan macules - Discussed due to sun exposure - Benign, observe - Call for any changes  Seborrheic Keratoses - Stuck-on, waxy, tan-brown papules and plaques  - Discussed benign etiology and prognosis. - Observe - Call for any changes  Melanocytic Nevi - Tan-brown and/or pink-flesh-colored symmetric macules and papules - Benign appearing on exam today - Observation - Call clinic for new or changing moles - Recommend daily use of broad spectrum spf 30+ sunscreen to sun-exposed areas.   Hemangiomas - Red papules - Discussed benign nature - Observe - Call for any changes  Actinic Damage - diffuse scaly erythematous macules with underlying dyspigmentation - Recommend daily broad spectrum sunscreen SPF 30+ to sun-exposed areas, reapply every 2 hours as needed.  - Call for new or changing lesions.  Skin cancer screening performed today.  Return in about 1 year (around 01/12/2021) for TBSE.  I, Donzetta Kohut, CMA, am acting as scribe for Sarina Ser, MD . Documentation: I have reviewed the above documentation for accuracy and completeness, and I agree with the above.  Sarina Ser, MD

## 2020-01-13 NOTE — Patient Instructions (Signed)

## 2020-01-16 ENCOUNTER — Other Ambulatory Visit: Payer: Self-pay | Admitting: Family Medicine

## 2020-01-16 NOTE — Telephone Encounter (Signed)
Requested Prescriptions  Pending Prescriptions Disp Refills  . diltiazem (TIAZAC) 240 MG 24 hr capsule [Pharmacy Med Name: DILTIAZEM 24HR ER 240 MG CAP] 90 capsule 0    Sig: TAKE ONE CAPSULE BY MOUTH DAILY     Cardiovascular:  Calcium Channel Blockers Passed - 01/16/2020 11:35 AM      Passed - Last BP in normal range    BP Readings from Last 1 Encounters:  10/01/19 120/72         Passed - Valid encounter within last 6 months    Recent Outpatient Visits          3 months ago Dyslipidemia   [facility] [name], MD   5 months ago Benign essential HTN   [facility] [name], MD   7 months ago Well adult exam   [facility] [name], MD   9 months ago Acute bronchitis, unspecified organism   [facility] [name], MD   10 months ago Benign essential HTN   [facility] [name], MD      Future Appointments            In 2 weeks [name], MD [facility], [facility]

## 2020-01-17 ENCOUNTER — Encounter: Payer: Self-pay | Admitting: Dermatology

## 2020-02-01 ENCOUNTER — Ambulatory Visit: Payer: Medicare Other | Admitting: Family Medicine

## 2020-02-16 ENCOUNTER — Other Ambulatory Visit: Payer: Self-pay | Admitting: Family Medicine

## 2020-02-16 DIAGNOSIS — E039 Hypothyroidism, unspecified: Secondary | ICD-10-CM

## 2020-02-16 DIAGNOSIS — E785 Hyperlipidemia, unspecified: Secondary | ICD-10-CM

## 2020-02-26 LAB — LIPID PANEL
Cholesterol: 176 mg/dL (ref <200)
HDL: 52 mg/dL (ref >=40)
LDL Cholesterol (Calc): 102 mg/dL (calc) — ABNORMAL HIGH
Non-HDL Cholesterol (Calc): 124 mg/dL (calc) (ref <130)
Total CHOL/HDL Ratio: 3.4 (calc) (ref <5.0)
Triglycerides: 127 mg/dL (ref <150)

## 2020-02-26 LAB — TESTOSTERONE TOTAL,FREE,BIO, MALES
Albumin: 4.6 g/dL (ref 3.6–5.1)
Sex Hormone Binding: 14 nmol/L — ABNORMAL LOW (ref 22–77)
Testosterone: 183 ng/dL — ABNORMAL LOW (ref 250–827)

## 2020-02-26 LAB — CBC WITH DIFFERENTIAL/PLATELET
Absolute Monocytes: 310 cells/uL (ref 200–950)
Basophils Absolute: 40 cells/uL (ref 0–200)
Basophils Relative: 0.6 %
Eosinophils Absolute: 112 cells/uL (ref 15–500)
Eosinophils Relative: 1.7 %
HCT: 38.3 % — ABNORMAL LOW (ref 38.5–50.0)
Hemoglobin: 13.3 g/dL (ref 13.2–17.1)
Lymphs Abs: 1247 cells/uL (ref 850–3900)
MCH: 32 pg (ref 27.0–33.0)
MCHC: 34.7 g/dL (ref 32.0–36.0)
MCV: 92.1 fL (ref 80.0–100.0)
MPV: 9.4 fL (ref 7.5–12.5)
Monocytes Relative: 4.7 %
Neutro Abs: 4891 cells/uL (ref 1500–7800)
Neutrophils Relative %: 74.1 %
Platelets: 208 10*3/uL (ref 140–400)
RBC: 4.16 10*6/uL — ABNORMAL LOW (ref 4.20–5.80)
RDW: 13.7 % (ref 11.0–15.0)
Total Lymphocyte: 18.9 %
WBC: 6.6 10*3/uL (ref 3.8–10.8)

## 2020-02-26 LAB — COMPLETE METABOLIC PANEL WITH GFR
AG Ratio: 2.3 (calc) (ref 1.0–2.5)
ALT: 46 U/L (ref 9–46)
AST: 29 U/L (ref 10–35)
Albumin: 4.6 g/dL (ref 3.6–5.1)
Alkaline phosphatase (APISO): 75 U/L (ref 35–144)
BUN: 23 mg/dL (ref 7–25)
CO2: 28 mmol/L (ref 20–32)
Calcium: 9.8 mg/dL (ref 8.6–10.3)
Chloride: 104 mmol/L (ref 98–110)
Creat: 1.01 mg/dL (ref 0.70–1.25)
GFR, Est African American: 88 mL/min/{1.73_m2} (ref >=60)
GFR, Est Non African American: 76 mL/min/{1.73_m2} (ref >=60)
Globulin: 2 g/dL (calc) (ref 1.9–3.7)
Glucose, Bld: 116 mg/dL — ABNORMAL HIGH (ref 65–99)
Potassium: 3.9 mmol/L (ref 3.5–5.3)
Sodium: 142 mmol/L (ref 135–146)
Total Bilirubin: 0.5 mg/dL (ref 0.2–1.2)
Total Protein: 6.6 g/dL (ref 6.1–8.1)

## 2020-02-26 LAB — HEMOGLOBIN A1C
Hgb A1c MFr Bld: 4.9 % of total Hgb (ref <5.7)
Mean Plasma Glucose: 94 (calc)
eAG (mmol/L): 5.2 (calc)

## 2020-02-26 LAB — PSA: PSA: 1 ng/mL (ref <=4.0)

## 2020-02-26 LAB — TSH: TSH: 11.34 mIU/L — ABNORMAL HIGH (ref 0.40–4.50)

## 2020-02-29 ENCOUNTER — Other Ambulatory Visit: Payer: Self-pay

## 2020-02-29 ENCOUNTER — Encounter: Payer: Self-pay | Admitting: Family Medicine

## 2020-02-29 ENCOUNTER — Ambulatory Visit (INDEPENDENT_AMBULATORY_CARE_PROVIDER_SITE_OTHER): Payer: Medicare Other | Admitting: Family Medicine

## 2020-02-29 DIAGNOSIS — E785 Hyperlipidemia, unspecified: Secondary | ICD-10-CM | POA: Diagnosis not present

## 2020-02-29 DIAGNOSIS — E039 Hypothyroidism, unspecified: Secondary | ICD-10-CM | POA: Diagnosis not present

## 2020-02-29 DIAGNOSIS — R600 Localized edema: Secondary | ICD-10-CM | POA: Diagnosis not present

## 2020-02-29 DIAGNOSIS — I10 Essential (primary) hypertension: Secondary | ICD-10-CM

## 2020-02-29 DIAGNOSIS — Z79899 Other long term (current) drug therapy: Secondary | ICD-10-CM

## 2020-02-29 DIAGNOSIS — E559 Vitamin D deficiency, unspecified: Secondary | ICD-10-CM

## 2020-02-29 DIAGNOSIS — E291 Testicular hypofunction: Secondary | ICD-10-CM

## 2020-02-29 MED ORDER — ROSUVASTATIN CALCIUM 20 MG PO TABS: 20.0000 mg | ORAL_TABLET | Freq: Every day | ORAL | 1 refills | Status: DC

## 2020-02-29 MED ORDER — POTASSIUM CHLORIDE CRYS ER 20 MEQ PO TBCR: 20.0000 meq | EXTENDED_RELEASE_TABLET | Freq: Every day | ORAL | 0 refills | Status: DC

## 2020-02-29 NOTE — Progress Notes (Signed)
Name: Alan Campbell   [name]: [id number]    DOB: 03-07-1951   Date:02/29/2020       Progress Note  Subjective  Chief Complaint  Chief Complaint  Patient presents with  . Follow-up  . Medication Refill  . Hypertension    HPI  Hypothyroidism: he is on levothyroxine 150 mcg six days a week, last week TSH at 11 we will resume levothyroxine 150 mcg daily. Denies constipation , dry skin , takes it in an empty stomach as prescribed and compliant with medication   Hypogonadism:last PSA was normal again 02/2020 Testosterone level is still low but he states symptoms of fatigue is controlled, libido is poor but wife had cancer and not very sexually active in the past 15 years.   He states two pumps seems to be good for him  Metabolic syndrome: he was on Metformin for many years, but still had insulin resistance and weight was trending up, he has been on GLP agonistssince Spring 2018. He lost weight but has been off medication secondary to cost around January 2021  His weight has been stable since.   HTN: taking medication daily and denies side effects,EKG was abnormalseen by Dr. Clayborn Bigness and had stress test and echo done 07/2017, mild right ventricular enlargement otherwise normal, he occasionally snores, deniesdecreased in exercise tolerance.He denies dizziness, chest pain or palpitation. He is having worsening of lower extremity swelling without symptoms of CHF. He has tried multiple bp medications in the past without success and prefers not changing at this time. Lower extremity edema is worse on left leg and we will refer him to vascular surgeon   Dyslipidemia: reviewed labs . HDL improved and LDL went up from 91 to 102. He states he has been taking medications, avoiding fried food and not sure why it went up, explained likely from elevated TSH  OA both knees: going to [facility], doing better     Patient Active Problem List   Diagnosis Date Noted  . Right epiretinal membrane  10/05/2019  . Posterior vitreous detachment of right eye 10/05/2019  . Nuclear sclerotic cataract of left eye 10/05/2019  . 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 Vitreomacular adhesion of left eye 10/05/2019  . Tendinitis of foot 09/15/2019  . Colon cancer screening   . Epididymal cyst 06/24/2016  . Anemia, iron deficiency 01/16/2016  . Benign essential HTN 12/30/2014  . Dyslipidemia 12/30/2014  . Adult hypothyroidism 12/30/2014  . Dysmetabolic syndrome 95/62/1308  . Obesity (BMI 30-39.9) 12/30/2014  . Arthritis, degenerative 12/30/2014  . Perennial allergic rhinitis with seasonal variation 12/30/2014  . 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  . Patella-femoral syndrome 12/03/2014  . Low back pain 04/03/2010  . Vitamin D deficiency 04/14/2009  . Abnormal presence of protein in urine 09/12/2007  . OP (osteoporosis) 05/05/2007    Past Surgical History:  Procedure Laterality Date  . BACK SURGERY    . COLONOSCOPY WITH PROPOFOL N/A 08/14/2019   Procedure: COLONOSCOPY WITH PROPOFOL;  Surgeon: Lin Landsman, MD;  Location: [facility] ENDOSCOPY;  Service: Gastroenterology;  Laterality: N/A;  PRIORITY 4  . EYE SURGERY  2016  . LAMINECTOMY  1985   L-5  . SPINE SURGERY  1985    Family History  Problem Relation Age of Onset  . Heart disease Father   . Early death Father   . Hypothyroidism Sister   . Thyroid disease Sister   . Hypothyroidism Sister   . Migraines Sister   . Prostate cancer  Neg Hx   . Bladder Cancer Neg Hx   . Kidney cancer Neg Hx     Social History   Tobacco Use  . Smoking status: Former Smoker    Packs/day: 0.50    Years: 5.00    Pack years: 2.50    Types: Cigarettes    Quit date: 10/03/1970    Years since quitting: 49.4  . Smokeless tobacco: Never Used  . Tobacco comment: Quit 1972  Substance Use Topics  . Alcohol use: Yes    Alcohol/week: 4.0 standard drinks    Types: 4 Standard drinks or equivalent per week      Current Outpatient Medications:  .  amLODipine (NORVASC) 10 MG tablet, Take 1 tablet (10 mg total) by mouth daily., Disp: 90 tablet, Rfl: 1 .  Cholecalciferol (VITAMIN D) 2000 units CAPS, Take 1 capsule (2,000 Units total) by mouth daily., Disp: 30 capsule, Rfl: 0 .  Cinnamon 500 MG TABS, Take by mouth., Disp: , Rfl:  .  diltiazem (TIAZAC) 240 MG 24 hr capsule, TAKE ONE CAPSULE BY MOUTH DAILY, Disp: 90 capsule, Rfl: 0 .  furosemide (LASIX) 20 MG tablet, Take 1 tablet by mouth daily as needed., Disp: , Rfl:  .  levothyroxine (SYNTHROID) 150 MCG tablet, TAKE ONE TABLET BY MOUTH DAILY, Disp: 90 tablet, Rfl: 0 .  Multiple Vitamins-Minerals (CENTRUM SILVER 50+MEN) TABS, Take 1 tablet by mouth daily., Disp: , Rfl:  .  OMEGA-3 FATTY ACIDS PO, Take 1 tablet by mouth daily., Disp: , Rfl:  .  rosuvastatin (CRESTOR) 20 MG tablet, TAKE ONE TABLET BY MOUTH DAILY, Disp: 90 tablet, Rfl: 0 .  Testosterone 20.25 MG/ACT (1.62%) GEL, Apply 2 each topically daily. 2 pumps daily, Disp: 75 g, Rfl: 5 .  triamterene-hydrochlorothiazide (MAXZIDE-25) 37.5-25 MG tablet, Take 1 tablet by mouth daily., Disp: 90 tablet, Rfl: 1 .  valsartan (DIOVAN) 320 MG tablet, Take 1 tablet (320 mg total) by mouth daily., Disp: 90 tablet, Rfl: 1  No Known Allergies  I personally reviewed active problem list, medication list, allergies, family history, social history, health maintenance with the patient/caregiver today.   ROS  Constitutional: Negative for fever , positive for  weight change.  Respiratory: Negative for cough and shortness of breath.   Cardiovascular: Negative for chest pain or palpitations.  Gastrointestinal: Negative for abdominal pain, no bowel changes.  Musculoskeletal: positive for intermittent  gait problem but no knee  joint swelling.  Skin: Negative for rash.   Neurological: Negative for dizziness or headache.  No other specific complaints in a complete review of systems (except as listed in HPI  above).  Objective  Vitals:   02/29/20 1139  BP: 124/76  Pulse: 63  Resp: 18  Temp: 98.1 F (36.7 C)  TempSrc: Oral  SpO2: 98%  Weight: 260 lb 1.6 oz (118 kg)  Height: 5\' 10"  (1.778 m)    Body mass index is 37.32 kg/m.  Physical Exam  Constitutional: Patient appears well-developed and well-nourished. Obese  No distress.  HEENT: head atraumatic, normocephalic, pupils equal and reactive to light, neck supple Cardiovascular: Normal rate, regular rhythm and normal heart sounds.  No murmur heard. 2 plus  BLE edema. Left more than right side  Pulmonary/Chest: Effort normal and breath sounds normal. No respiratory distress. Abdominal: Soft.  There is no tenderness. Psychiatric: Patient has a normal mood and affect. behavior is normal. Judgment and thought content normal.  Recent Results (from the past 2160 hour(s))  Lipid panel     Status:  Abnormal   Collection Time: 02/25/20  9:25 AM  Result Value Ref [place]   Cholesterol 176 <200 mg/dL   HDL 52 > OR = 40 mg/dL   Triglycerides 127 <150 mg/dL   LDL Cholesterol (Calc) 102 (H) mg/dL (calc)    Comment: Reference range: <100 . Desirable range <100 mg/dL for primary prevention;   <70 mg/dL for patients with CHD or diabetic patients  with > or = 2 CHD risk factors. Marland Kitchen LDL-C is now calculated using the Martin-Hopkins  calculation, which is a validated novel method providing  better accuracy than the Friedewald equation in the  estimation of LDL-C.  Cresenciano Genre et al. Annamaria Helling. 6295;284(13): 2061-2068  (http://education.[employer].com/faq/FAQ164)    Total CHOL/HDL Ratio 3.4 <5.0 (calc)   Non-HDL Cholesterol (Calc) 124 <130 mg/dL (calc)    Comment: For patients with diabetes plus 1 major ASCVD risk  factor, treating to a non-HDL-C goal of <100 mg/dL  (LDL-C of <70 mg/dL) is considered a therapeutic  option.   CBC with Differential/Platelet     Status: Abnormal   Collection Time: 02/25/20  9:25 AM  Result Value Ref [place]    WBC 6.6 3.8 - 10.8 Thousand/uL   RBC 4.16 (L) 4.20 - 5.80 Million/uL   Hemoglobin 13.3 13.2 - 17.1 g/dL   HCT 38.3 (L) 38 - 50 %   MCV 92.1 80.0 - 100.0 fL   MCH 32.0 27.0 - 33.0 pg   MCHC 34.7 32.0 - 36.0 g/dL   RDW 13.7 11.0 - 15.0 %   Platelets 208 140 - 400 Thousand/uL   MPV 9.4 7.5 - 12.5 fL   Neutro Abs 4,891 1,500 - 7,800 cells/uL   Lymphs Abs 1,247 850 - 3,900 cells/uL   Absolute Monocytes 310 200 - 950 cells/uL   Eosinophils Absolute 112 15 - 500 cells/uL   Basophils Absolute 40 0 - 200 cells/uL   Neutrophils Relative % 74.1 %   Total Lymphocyte 18.9 %   Monocytes Relative 4.7 %   Eosinophils Relative 1.7 %   Basophils Relative 0.6 %  COMPLETE METABOLIC PANEL WITH GFR     Status: Abnormal   Collection Time: 02/25/20  9:25 AM  Result Value Ref [place]   Glucose, Bld 116 (H) 65 - 99 mg/dL    Comment: .            Fasting reference interval . For someone without known diabetes, a glucose value between 100 and 125 mg/dL is consistent with prediabetes and should be confirmed with a follow-up test. .    BUN 23 7 - 25 mg/dL   Creat 1.01 0.70 - 1.25 mg/dL    Comment: For patients >45 years of age, the reference limit for Creatinine is approximately 13% higher for people identified as African-American. .    GFR, Est Non African American 76 > OR = 60 mL/min/1.44m2   GFR, Est African American 88 > OR = 60 mL/min/1.70m2   BUN/Creatinine Ratio NOT APPLICABLE 6 - 22 (calc)   Sodium 142 135 - 146 mmol/L   Potassium 3.9 3.5 - 5.3 mmol/L   Chloride 104 98 - 110 mmol/L   CO2 28 20 - 32 mmol/L   Calcium 9.8 8.6 - 10.3 mg/dL   Total Protein 6.6 6.1 - 8.1 g/dL   Albumin 4.6 3.6 - 5.1 g/dL   Globulin 2.0 1.9 - 3.7 g/dL (calc)   AG Ratio 2.3 1.0 - 2.5 (calc)   Total Bilirubin 0.5 0.2 - 1.2 mg/dL   Alkaline  phosphatase (APISO) 75 35 - 144 U/L   AST 29 10 - 35 U/L   ALT 46 9 - 46 U/L  TSH     Status: Abnormal   Collection Time: 02/25/20  9:25 AM  Result Value Ref [place]   TSH  11.34 (H) 0.40 - 4.50 mIU/L  Testosterone Total,Free,Bio, Males     Status: Abnormal   Collection Time: 02/25/20  9:25 AM  Result Value Ref [place]   Testosterone 183 (L) 250 - 827 ng/dL   Albumin 4.6 3.6 - 5.1 g/dL   Sex Hormone Binding 14 (L) 22 - 77 nmol/L   Testosterone, Free See below 46.0 - 224.0 pg/mL   Testosterone, Bioavailable  110.0 - 575 ng/dL    Comment: Due to the diminished accuracy of immunoassay at levels below 250 ng/dL, calculations of the Free and Bioavailable Testosterone are not accurate. If needed, Testosterone, Free, Bio and Total, LC/MS/MS (test code [phone]) is the recommended assay. This specimen  must be collected in a red-top tube with no gel. .   Hemoglobin A1c     Status: None   Collection Time: 02/25/20  9:25 AM  Result Value Ref Range   Hgb A1c MFr Bld 4.9 <5.7 % of total Hgb    Comment: For the purpose of screening for the presence of diabetes: . <5.7%       Consistent with the absence of diabetes 5.7-6.4%    Consistent with increased risk for diabetes             (prediabetes) > or =6.5%  Consistent with diabetes . This assay result is consistent with a decreased risk of diabetes. . Currently, no consensus exists regarding use of hemoglobin A1c for diagnosis of diabetes in children. . According to American Diabetes Association (ADA) guidelines, hemoglobin A1c <7.0% represents optimal control in non-pregnant diabetic patients. Different metrics may apply to specific patient populations.  Standards of Medical Care in Diabetes(ADA). .    Mean Plasma Glucose 94 (calc)   eAG (mmol/L) 5.2 (calc)  PSA     Status: None   Collection Time: 02/25/20  9:25 AM  Result Value Ref [place]   PSA 1.0 < OR = 4.0 ng/mL    Comment: The total PSA value from this assay system is  standardized against the WHO standard. The test  result will be approximately 20% lower when compared  to the equimolar-standardized total PSA (Beckman  Coulter). Comparison of  serial PSA results should be  interpreted with this fact in mind. . This test was performed using the Siemens  chemiluminescent method. Values obtained from  different assay methods cannot be used interchangeably. PSA levels, regardless of value, should not be interpreted as absolute evidence of the presence or absence of disease.       PHQ2/9: Depression screen [facility] 2/9 02/29/2020 10/01/2019 07/23/2019 06/02/2019 04/10/2019  Decreased Interest 0 0 0 0 0  Down, Depressed, Hopeless 0 0 0 0 0  PHQ - 2 Score 0 0 0 0 0  Altered sleeping - 0 0 0 0  Tired, decreased energy - 0 0 0 0  Change in appetite - 0 0 0 0  Feeling bad or failure about yourself  - 0 0 0 0  Trouble concentrating - 0 0 0 0  Moving slowly or fidgety/restless - 0 0 0 0  Suicidal thoughts - 0 0 0 0  PHQ-9 Score - 0 0 0 0  Difficult doing work/chores - Not difficult at all - -  Not difficult at all  Some recent data might be hidden    phq 9 is negative   Fall Risk: Fall Risk  02/29/2020 10/01/2019 07/23/2019 06/02/2019 04/10/2019  Falls in the past year? 0 0 0 0 0  Number falls in past yr: 0 0 0 0 0  Injury with Fall? 0 0 0 0 0  Follow up - Falls evaluation completed - - -     Functional Status Survey: Is the patient deaf or have difficulty hearing?: No Does the patient have difficulty seeing, even when wearing glasses/contacts?: No Does the patient have difficulty concentrating, remembering, or making decisions?: No Does the patient have difficulty walking or climbing stairs?: No Does the patient have difficulty dressing or bathing?: No Does the patient have difficulty doing errands alone such as visiting a doctor's office or shopping?: No   Assessment & Plan  1. Hypothyroidism, adult  - TSH  2. Morbid obesity ([place])  Discussed with the patient the risk posed by an increased BMI. Discussed importance of portion control, calorie counting and at least 150 minutes of physical activity weekly. Avoid sweet  beverages and drink more water. Eat at least 6 servings of fruit and vegetables daily   3. Dyslipidemia  - rosuvastatin (CRESTOR) 20 MG tablet; Take 1 tablet (20 mg total) by mouth daily.  Dispense: 90 tablet; Refill: 1  4. Bilateral lower extremity edema  - Ambulatory referral to Vascular Surgery  5. Long-term use of high-risk medication  - potassium chloride SA (KLOR-CON) 20 MEQ tablet; Take 1 tablet (20 mEq total) by mouth daily. Take only when you take furosemide  Dispense: 90 tablet; Refill: 0  6. Benign essential HTN  bp is at goal  7. Hypogonadism in male  He does not want to adjust dose at this time  8. Vitamin D deficiency  Continue supplementation

## 2020-03-14 ENCOUNTER — Other Ambulatory Visit: Payer: Self-pay | Admitting: Family Medicine

## 2020-03-14 DIAGNOSIS — I10 Essential (primary) hypertension: Secondary | ICD-10-CM

## 2020-03-21 ENCOUNTER — Other Ambulatory Visit: Payer: Self-pay

## 2020-03-21 ENCOUNTER — Ambulatory Visit (INDEPENDENT_AMBULATORY_CARE_PROVIDER_SITE_OTHER): Payer: Medicare Other | Admitting: Vascular Surgery

## 2020-03-21 ENCOUNTER — Encounter: Payer: Self-pay | Admitting: Family Medicine

## 2020-03-21 ENCOUNTER — Encounter (INDEPENDENT_AMBULATORY_CARE_PROVIDER_SITE_OTHER): Payer: Self-pay | Admitting: Vascular Surgery

## 2020-03-21 ENCOUNTER — Ambulatory Visit (INDEPENDENT_AMBULATORY_CARE_PROVIDER_SITE_OTHER): Payer: Medicare Other

## 2020-03-21 VITALS — BP 126/69 | HR 65 | Resp 16 | Ht 71.0 in | Wt 256.0 lb

## 2020-03-21 DIAGNOSIS — E785 Hyperlipidemia, unspecified: Secondary | ICD-10-CM

## 2020-03-21 DIAGNOSIS — I10 Essential (primary) hypertension: Secondary | ICD-10-CM | POA: Diagnosis not present

## 2020-03-21 DIAGNOSIS — I890 Lymphedema, not elsewhere classified: Secondary | ICD-10-CM

## 2020-03-21 DIAGNOSIS — I872 Venous insufficiency (chronic) (peripheral): Secondary | ICD-10-CM | POA: Diagnosis not present

## 2020-03-21 DIAGNOSIS — Z23 Encounter for immunization: Secondary | ICD-10-CM

## 2020-03-22 ENCOUNTER — Other Ambulatory Visit: Payer: Self-pay | Admitting: Family Medicine

## 2020-03-22 ENCOUNTER — Encounter (INDEPENDENT_AMBULATORY_CARE_PROVIDER_SITE_OTHER): Payer: Self-pay | Admitting: Vascular Surgery

## 2020-03-22 DIAGNOSIS — I890 Lymphedema, not elsewhere classified: Secondary | ICD-10-CM | POA: Insufficient documentation

## 2020-03-22 DIAGNOSIS — I872 Venous insufficiency (chronic) (peripheral): Secondary | ICD-10-CM | POA: Insufficient documentation

## 2020-03-22 MED ORDER — VERAPAMIL HCL ER 240 MG PO TBCR: 240.0000 mg | EXTENDED_RELEASE_TABLET | Freq: Every day | ORAL | 1 refills | Status: DC

## 2020-03-22 NOTE — Progress Notes (Signed)
[name] : [id number]  Alan Campbell is a 69 y.o. (1950-06-24) male who presents with chief complaint of  Chief Complaint  Patient presents with  . New Patient (Initial Visit)    ref [place] le edema  .  History of Present Illness:   Patient is seen for evaluation of leg swelling. The patient first noticed the swelling remotely but is now concerned because of a significant increase in the overall edema. The swelling is associated with some discomfort and discoloration.  The left leg is more affected than the right and this has been since the beginning.  The patient notes that in the morning the legs are significantly improved but they steadily worsened throughout the course of the day. Elevation makes the legs better, dependency makes them much worse.   There is no history of ulcerations associated with the swelling.   The patient does describe a recent change in his blood pressure medications.  The patient has not been wearing graduated compression regularly although he has worn them intermittently in the past.  The patient has no had any past angiography, interventions or vascular surgery.  The patient denies a history of DVT or PE. There is no prior history of phlebitis. There is no history of primary lymphedema.  There is no history of radiation treatment to the groin or pelvis No history of malignancies. No history of trauma or groin or pelvic surgery. No history of foreign travel or parasitic infections area    Current Meds  Medication Sig  . amLODipine (NORVASC) 10 MG tablet Take 1 tablet (10 mg total) by mouth daily.  . Cholecalciferol (VITAMIN D) 2000 units CAPS Take 1 capsule (2,000 Units total) by mouth daily.  . Cinnamon 500 MG TABS Take 2,000 mg by mouth.   . CRANBERRY PO Take 4,200 mg by mouth daily.  Marland Kitchen diltiazem (TIAZAC) 240 MG 24 hr capsule TAKE ONE CAPSULE BY MOUTH DAILY  . furosemide (LASIX) 20 MG tablet Take 1 tablet by mouth daily as needed.  Marland Kitchen levothyroxine  (SYNTHROID) 150 MCG tablet TAKE ONE TABLET BY MOUTH DAILY  . Multiple Vitamins-Minerals (CENTRUM SILVER 50+MEN) TABS Take 1 tablet by mouth daily.  . OMEGA-3 FATTY ACIDS PO Take 1 tablet by mouth daily.  . potassium chloride SA (KLOR-CON) 20 MEQ tablet Take 1 tablet (20 mEq total) by mouth daily. Take only when you take furosemide  . rosuvastatin (CRESTOR) 20 MG tablet Take 1 tablet (20 mg total) by mouth daily.  . Testosterone 20.25 MG/ACT (1.62%) GEL Apply 2 each topically daily. 2 pumps daily  . triamterene-hydrochlorothiazide (MAXZIDE-25) 37.5-25 MG tablet TAKE ONE TABLET BY MOUTH DAILY  . valsartan (DIOVAN) 320 MG tablet TAKE ONE TABLET BY MOUTH DAILY    Past Medical History:  Diagnosis Date  . Allergy   . Anemia, iron deficiency   . Cataract   . Dysplastic nevus 04/24/2010   Left low back. Moderate to severe atypia, edge involved.  . Hyperlipidemia   . Hypertension   . Low back pain   . Migraine   . Obesity   . OP (osteoporosis)   . Thyroid disease   . Vitamin D deficiency     Past Surgical History:  Procedure Laterality Date  . BACK SURGERY    . COLONOSCOPY WITH PROPOFOL N/A 08/14/2019   Procedure: COLONOSCOPY WITH PROPOFOL;  Surgeon: Lin Landsman, MD;  Location: [facility] ENDOSCOPY;  Service: Gastroenterology;  Laterality: N/A;  PRIORITY 4  . EYE SURGERY  2016  .  LAMINECTOMY  1985   L-5  . SPINE SURGERY  1985    Social History Social History   Tobacco Use  . Smoking status: Former Smoker    Packs/day: 0.50    Years: 5.00    Pack years: 2.50    Types: Cigarettes    Quit date: 10/03/1970    Years since quitting: 49.5  . Smokeless tobacco: Never Used  . Tobacco comment: Quit 1972  Vaping Use  . Vaping Use: Never used  Substance Use Topics  . Alcohol use: Yes    Alcohol/week: 4.0 standard drinks    Types: 4 Standard drinks or equivalent per week  . Drug use: No    Family History Family History  Problem Relation Age of Onset  . Heart disease Father     . Early death Father   . Hypothyroidism Sister   . Thyroid disease Sister   . Hypothyroidism Sister   . Migraines Sister   . Prostate cancer Neg Hx   . Bladder Cancer Neg Hx   . Kidney cancer Neg Hx   No family history of bleeding/clotting disorders, porphyria or autoimmune disease   No Known Allergies   REVIEW OF SYSTEMS (Negative unless checked)  Constitutional: [] Weight loss  [] Fever  [] Chills Cardiac: [] Chest pain   [] Chest pressure   [] Palpitations   [] Shortness of breath when laying flat   [] Shortness of breath with exertion. Vascular:  [] Pain in legs with walking   [x] Pain in legs at rest  [] History of DVT   [] Phlebitis   [x] Swelling in legs   [] Varicose veins   [] Non-healing ulcers Pulmonary:   [] Uses home oxygen   [] Productive cough   [] Hemoptysis   [] Wheeze  [] COPD   [] Asthma Neurologic:  [] Dizziness   [] Seizures   [] History of stroke   [] History of TIA  [] Aphasia   [] Vissual changes   [] Weakness or numbness in arm   [] Weakness or numbness in leg Musculoskeletal:   [] Joint swelling   [x] Joint pain   [] Low back pain Hematologic:  [] Easy bruising  [] Easy bleeding   [] Hypercoagulable state   [] Anemic Gastrointestinal:  [] Diarrhea   [] Vomiting  [] Gastroesophageal reflux/heartburn   [] Difficulty swallowing. Genitourinary:  [] Chronic kidney disease   [] Difficult urination  [] Frequent urination   [] Blood in urine Skin:  [] Rashes   [] Ulcers  Psychological:  [] History of anxiety   []  History of major depression.  Physical Examination  Vitals:   03/21/20 1306  BP: 126/69  Pulse: 65  Resp: 16  Weight: 256 lb (116.1 kg)  Height: 5\' 11"  (1.803 m)   Body mass index is 35.7 kg/m. Gen: WD/WN, NAD Head: [place]/AT, No temporalis wasting.  Ear/Nose/Throat: Hearing grossly intact, nares w/o erythema or drainage, poor dentition Eyes: PER, EOMI, sclera nonicteric.  Neck: Supple, no masses.  No bruit or JVD.  Pulmonary:  Good air movement, clear to auscultation bilaterally, no use of  accessory muscles.  Cardiac: RRR, normal S1, S2, no Murmurs. Vascular: scattered varicosities present bilaterally.  Mild to moderate venous stasis changes to the legs bilaterally.  3-4+ firm pitting edema Vessel Right Left  Radial Palpable Palpable  Gastrointestinal: soft, non-distended. No guarding/no peritoneal signs.  Musculoskeletal: M/S 5/5 throughout.  No deformity or atrophy.  Neurologic: CN 2-12 intact. Pain and light touch intact in extremities.  Symmetrical.  Speech is fluent. Motor exam as listed above. Psychiatric: Judgment intact, Mood & affect appropriate for pt's clinical situation. Dermatologic: Venous rashes no ulcers noted.  No changes consistent with cellulitis. Lymph :  No lichenification or skin changes of chronic lymphedema.  CBC Lab Results  Component Value Date   WBC 6.6 02/25/2020   HGB 13.3 02/25/2020   HCT 38.3 (L) 02/25/2020   MCV 92.1 02/25/2020   PLT 208 02/25/2020    BMET    Component Value Date/Time   NA 142 02/25/2020 0925   NA 146 (H) 11/29/2015 0842   K 3.9 02/25/2020 0925   CL 104 02/25/2020 0925   CO2 28 02/25/2020 0925   GLUCOSE 116 (H) 02/25/2020 0925   BUN 23 02/25/2020 0925   BUN 23 11/29/2015 0842   CREATININE 1.01 02/25/2020 0925   CALCIUM 9.8 02/25/2020 0925   GFRNONAA 76 02/25/2020 0925   GFRAA 88 02/25/2020 0925   CrCl cannot be calculated (Patient's most recent lab result is older than the maximum 21 days allowed.).  COAG No results found for: INR, PROTIME  Radiology No results found.    Assessment/Plan 1. Lymphedema I have had a long discussion with the patient regarding swelling and why it  causes symptoms.  Patient will begin wearing graduated compression stockings class 1 (20-30 mmHg) on a daily basis a prescription was given. The patient will  beginning wearing the stockings first thing in the morning and removing them in the evening. The patient is instructed specifically not to sleep in the stockings.   In  addition, behavioral modification will be initiated.  This will include frequent elevation, use of over the counter pain medications and exercise such as walking.  I have reviewed systemic causes for chronic edema such as liver, kidney and cardiac etiologies.  The patient denies problems with these organ systems.    Consideration for a lymph pump will also be made based upon the effectiveness of conservative therapy.  This would help to improve the edema control and prevent sequela such as ulcers and infections   Patient should undergo duplex ultrasound of the venous system to ensure that DVT or reflux is not present.  The patient will follow-up with me after the ultrasound.   - VAS [place] LOWER EXTREMITY VENOUS REFLUX; Future  2. Chronic venous insufficiency No surgery or intervention at this point in time.    I have had a long discussion with the patient regarding venous insufficiency and why it  causes symptoms. I have discussed with the patient the chronic skin changes that accompany venous insufficiency and the long term sequela such as infection and ulceration.  Patient will begin wearing graduated compression stockings class 1 (20-30 mmHg) or compression wraps on a daily basis a prescription was given. The patient will put the stockings on first thing in the morning and removing them in the evening. The patient is instructed specifically not to sleep in the stockings.    In addition, behavioral modification including several periods of elevation of the lower extremities during the day will be continued. I have demonstrated that proper elevation is a position with the ankles at heart level.  The patient is instructed to begin routine exercise, especially walking on a daily basis  Patient should undergo duplex ultrasound of the venous system to ensure that DVT or reflux is not present.  Following the review of the ultrasound the patient will follow up in 2-3 months to reassess the degree of  swelling and the control that graduated compression stockings or compression wraps  is offering.   The patient can be assessed for a Lymph Pump at that time - VAS [place] LOWER EXTREMITY VENOUS REFLUX; Future  3. Benign essential HTN Continue antihypertensive medications as already ordered, these medications have been reviewed and there are no changes at this time.   4. Dyslipidemia Continue statin as ordered and reviewed, no changes at this time     Hortencia Pilar, MD  03/22/2020 9:23 AM

## 2020-05-20 ENCOUNTER — Other Ambulatory Visit: Payer: Self-pay | Admitting: Family Medicine

## 2020-05-20 DIAGNOSIS — E039 Hypothyroidism, unspecified: Secondary | ICD-10-CM

## 2020-05-31 ENCOUNTER — Ambulatory Visit (INDEPENDENT_AMBULATORY_CARE_PROVIDER_SITE_OTHER): Payer: Medicare Other | Admitting: Family Medicine

## 2020-05-31 ENCOUNTER — Other Ambulatory Visit: Payer: Self-pay

## 2020-05-31 ENCOUNTER — Encounter: Payer: Self-pay | Admitting: Family Medicine

## 2020-05-31 VITALS — BP 132/88 | HR 86 | Temp 97.8°F | Resp 16 | Ht 71.0 in | Wt 258.2 lb

## 2020-05-31 DIAGNOSIS — R739 Hyperglycemia, unspecified: Secondary | ICD-10-CM

## 2020-05-31 DIAGNOSIS — R600 Localized edema: Secondary | ICD-10-CM

## 2020-05-31 DIAGNOSIS — I10 Essential (primary) hypertension: Secondary | ICD-10-CM

## 2020-05-31 DIAGNOSIS — R399 Unspecified symptoms and signs involving the genitourinary system: Secondary | ICD-10-CM

## 2020-05-31 DIAGNOSIS — E291 Testicular hypofunction: Secondary | ICD-10-CM

## 2020-05-31 DIAGNOSIS — E8881 Metabolic syndrome: Secondary | ICD-10-CM

## 2020-05-31 DIAGNOSIS — I890 Lymphedema, not elsewhere classified: Secondary | ICD-10-CM

## 2020-05-31 DIAGNOSIS — E785 Hyperlipidemia, unspecified: Secondary | ICD-10-CM | POA: Diagnosis not present

## 2020-05-31 DIAGNOSIS — M79674 Pain in right toe(s): Secondary | ICD-10-CM

## 2020-05-31 DIAGNOSIS — I872 Venous insufficiency (chronic) (peripheral): Secondary | ICD-10-CM

## 2020-05-31 DIAGNOSIS — E039 Hypothyroidism, unspecified: Secondary | ICD-10-CM

## 2020-05-31 DIAGNOSIS — E559 Vitamin D deficiency, unspecified: Secondary | ICD-10-CM

## 2020-05-31 MED ORDER — DICLOFENAC SODIUM 75 MG PO TBEC: 75.0000 mg | DELAYED_RELEASE_TABLET | Freq: Two times a day (BID) | ORAL | 0 refills | Status: DC

## 2020-05-31 NOTE — Progress Notes (Signed)
Name: Alan Campbell   [name]: [id number]    DOB: [birth year]   Date:05/31/2020       Progress Note  Subjective  Chief Complaint  Follow Up  HPI  Hypothyroidism: he is on levothyroxine 150 mcg six days a week, last TSH was up to 11.Denies constipation , dry skin, we will recheck level today.   Hypogonadism:last PSA was normal again 02/2020 Testosterone level is still low but he states symptoms of fatigue is controlled, libido is poor but wife had cancer and not very sexually active in the past 15 years.   He states two pumps seems to be good for him. Unchanged   Metabolic syndrome: he was on Metformin for many years, but still had insulin resistance and weight was trending up, he has been on GLP agonistssince Spring 2018. He lost weight but has been off medication secondary to cost around January 2021 Weight has been stable.   HTN: taking medication daily and denies side effects,EKG was abnormalseen by Dr. Clayborn Bigness and had stress test and echo done 07/2017, mild right ventricular enlargement otherwise normal, he occasionally snores, deniesdecreased in exercise tolerance.He denies dizziness, chest pain or palpitation. He is having worsening of lower extremity swelling without symptoms of CHF. He was seen by vascular surgeon and was given he has lymphedema and venous insuficiency   INTERPRETATION - Echo 2019 at Dr. Etta Quill office  NORMAL LEFT VENTRICULAR SYSTOLIC FUNCTION WITH AN ESTIMATED EF = >55 %  NORMAL RIGHT VENTRICULAR SYSTOLIC FUNCTION  MILD TRICUSPID AND MITRAL VALVE INSUFFICIENCY  NO VALVULAR STENOSIS  MILD RV ENLARGEMENT  MILD BIATRIAL ENLARGEMENT    Dyslipidemia: reviewed labs . HDL improved and LDL went up from 91 to 102. He states he has been taking medications, avoiding fried food , eating at home, likely from uncontrolled hypothyroidism, we will recheck level next visit   OA both knees: going to [facility], stable   First toe pain: no trauma, but one week ago  he noticed pain and swelling on left first toe, area was very red. Initially pain was very intense, not as severe today but he has to use a cane   Patient Active Problem List   Diagnosis Date Noted  . Lymphedema 03/22/2020  . Chronic venous insufficiency 03/22/2020  . Morbid obesity ([place]) 02/29/2020  . Right epiretinal membrane 10/05/2019  . Posterior vitreous detachment of right eye 10/05/2019  . Nuclear sclerotic cataract of left eye 10/05/2019  . 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 Vitreomacular adhesion of left eye 10/05/2019  . Tendinitis of foot 09/15/2019  . Colon cancer screening   . Epididymal cyst 06/24/2016  . Anemia, iron deficiency 01/16/2016  . Benign essential HTN 12/30/2014  . Dyslipidemia 12/30/2014  . Adult hypothyroidism 12/30/2014  . Dysmetabolic syndrome 17/00/1749  . Obesity (BMI 30-39.9) 12/30/2014  . Arthritis, degenerative 12/30/2014  . Perennial allergic rhinitis with seasonal variation 12/30/2014  . 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  . Patella-femoral syndrome 12/03/2014  . Low back pain 04/03/2010  . Vitamin D deficiency 04/14/2009  . Abnormal presence of protein in urine 09/12/2007  . OP (osteoporosis) 05/05/2007    Past Surgical History:  Procedure Laterality Date  . BACK SURGERY    . COLONOSCOPY WITH PROPOFOL N/A 08/14/2019   Procedure: COLONOSCOPY WITH PROPOFOL;  Surgeon: Lin Landsman, MD;  Location[facility] ENDOSCOPY;  Service: Gastroenterology;  Laterality: N/A;  PRIORITY 4  . EYE SURGERY  2016  . LAMINECTOMY  1985   L-5  .  SPINE SURGERY  1985    Family History  Problem Relation Age of Onset  . Heart disease Father   . Early death Father   . Hypothyroidism Sister   . Thyroid disease Sister   . Hypothyroidism Sister   . Migraines Sister   . Prostate cancer Neg Hx   . Bladder Cancer Neg Hx   . Kidney cancer Neg Hx     Social History   Tobacco Use  . Smoking status: Former  Smoker    Packs/day: 0.50    Years: 5.00    Pack years: 2.50    Types: Cigarettes    Quit date: 10/03/1970    Years since quitting: 49.6  . Smokeless tobacco: Never Used  . Tobacco comment: Quit 1972  Substance Use Topics  . Alcohol use: Yes    Alcohol/week: 4.0 standard drinks    Types: 4 Standard drinks or equivalent per week     Current Outpatient Medications:  .  amLODipine (NORVASC) 10 MG tablet, Take 1 tablet (10 mg total) by mouth daily., Disp: 90 tablet, Rfl: 1 .  Cholecalciferol (VITAMIN D) 2000 units CAPS, Take 1 capsule (2,000 Units total) by mouth daily., Disp: 30 capsule, Rfl: 0 .  Cinnamon 500 MG TABS, Take 2,000 mg by mouth. , Disp: , Rfl:  .  CRANBERRY PO, Take 4,200 mg by mouth daily., Disp: , Rfl:  .  furosemide (LASIX) 20 MG tablet, Take 1 tablet by mouth daily as needed., Disp: , Rfl:  .  levothyroxine (SYNTHROID) 150 MCG tablet, TAKE ONE TABLET BY MOUTH DAILY, Disp: 90 tablet, Rfl: 0 .  Multiple Vitamins-Minerals (CENTRUM SILVER 50+MEN) TABS, Take 1 tablet by mouth daily., Disp: , Rfl:  .  OMEGA-3 FATTY ACIDS PO, Take 1 tablet by mouth daily., Disp: , Rfl:  .  potassium chloride SA (KLOR-CON) 20 MEQ tablet, Take 1 tablet (20 mEq total) by mouth daily. Take only when you take furosemide, Disp: 90 tablet, Rfl: 0 .  rosuvastatin (CRESTOR) 20 MG tablet, Take 1 tablet (20 mg total) by mouth daily., Disp: 90 tablet, Rfl: 1 .  Testosterone 20.25 MG/ACT (1.62%) GEL, Apply 2 each topically daily. 2 pumps daily, Disp: 75 g, Rfl: 5 .  triamterene-hydrochlorothiazide (MAXZIDE-25) 37.5-25 MG tablet, TAKE ONE TABLET BY MOUTH DAILY, Disp: 90 tablet, Rfl: 1 .  valsartan (DIOVAN) 320 MG tablet, TAKE ONE TABLET BY MOUTH DAILY, Disp: 90 tablet, Rfl: 1 .  verapamil (CALAN-SR) 240 MG CR tablet, Take 1 tablet (240 mg total) by mouth at bedtime., Disp: 90 tablet, Rfl: 1  No Known Allergies  I personally reviewed active problem list, medication list, allergies, family history, social  history, health maintenance with the patient/caregiver today.   ROS  Constitutional: Negative for fever or weight change.  Respiratory: Negative for cough and shortness of breath.   Cardiovascular: Negative for chest pain or palpitations.  Gastrointestinal: Negative for abdominal pain, no bowel changes.  Musculoskeletal: Positive  for gait problem and left first right toe  joint swelling.  Skin: Negative for rash.  Neurological: Negative for dizziness or headache.  No other specific complaints in a complete review of systems (except as listed in HPI above).   Objective  Vitals:   05/31/20 1314  BP: 132/88  Pulse: 86  Resp: 16  Temp: 97.8 F (36.6 C)  TempSrc: Oral  SpO2: 98%  Weight: 258 lb 3.2 oz (117.1 kg)  Height: 5\' 11"  (1.803 m)    Body mass index is 36.01 kg/m.  Physical Exam  Constitutional: Patient appears well-developed and well-nourished. Obese  No distress.  HEENT: head atraumatic, normocephalic, pupils equal and reactive to light, neck supple Cardiovascular: Normal rate, regular rhythm and normal heart sounds.  3/6 systolic ejection  murmur heard. Positive  BLE edema. Pulmonary/Chest: Effort normal and breath sounds normal. No respiratory distress. Abdominal: Soft.  There is no tenderness. Muscular Skeletal: first MCP joint was swollen , red, tender to touch  Psychiatric: Patient has a normal mood and affect. behavior is normal. Judgment and thought content normal.  PHQ2/9: Depression screen [facility] 2/9 05/31/2020 02/29/2020 10/01/2019 07/23/2019 06/02/2019  Decreased Interest 0 0 0 0 0  Down, Depressed, Hopeless 0 0 0 0 0  PHQ - 2 Score 0 0 0 0 0  Altered sleeping - - 0 0 0  Tired, decreased energy - - 0 0 0  Change in appetite - - 0 0 0  Feeling bad or failure about yourself  - - 0 0 0  Trouble concentrating - - 0 0 0  Moving slowly or fidgety/restless - - 0 0 0  Suicidal thoughts - - 0 0 0  PHQ-9 Score - - 0 0 0  Difficult doing work/chores - - Not  difficult at all - -  Some recent data might be hidden    phq 9 is negative   Fall Risk: Fall Risk  05/31/2020 02/29/2020 10/01/2019 07/23/2019 06/02/2019  Falls in the past year? 0 0 0 0 0  Number falls in past yr: 0 0 0 0 0  Injury with Fall? 0 0 0 0 0  Follow up - - Falls evaluation completed - -     Functional Status Survey: Is the patient deaf or have difficulty hearing?: No Does the patient have difficulty seeing, even when wearing glasses/contacts?: No Does the patient have difficulty concentrating, remembering, or making decisions?: No Does the patient have difficulty walking or climbing stairs?: No Does the patient have difficulty dressing or bathing?: No Does the patient have difficulty doing errands alone such as visiting a doctor's office or shopping?: No    Assessment & Plan  1. Great toe pain, right  - Uric acid - diclofenac (VOLTAREN) 75 MG EC tablet; Take 1 tablet (75 mg total) by mouth 2 (two) times daily.  Dispense: 30 tablet; Refill: 0  2. Dysmetabolic syndrome   3. Benign essential HTN  At goal   4. Dyslipidemia  On statin therapy   5. Hypothyroidism, adult  - TSH  6. Morbid obesity ([place])  Discussed with the patient the risk posed by an increased BMI. Discussed importance of portion control, calorie counting and at least 150 minutes of physical activity weekly. Avoid sweet beverages and drink more water. Eat at least 6 servings of fruit and vegetables daily   7. Vitamin D deficiency  Continue supplementation   8. Lower urinary tract symptoms (LUTS)   9. Hypogonadism in male  On low dose medication   10. Hyperglycemia  A1C is normal   11. Bilateral lower extremity edema  Stable  12. Chronic venous insufficiency   13. Lymphedema  Stable, he will try compression stocking hoses with a zipper

## 2020-06-01 LAB — URIC ACID: Uric Acid, Serum: 8.2 mg/dL — ABNORMAL HIGH (ref 4.0–8.0)

## 2020-06-01 LAB — TSH: TSH: 2.47 mIU/L (ref 0.40–4.50)

## 2020-06-13 ENCOUNTER — Other Ambulatory Visit: Payer: Self-pay | Admitting: Family Medicine

## 2020-06-13 DIAGNOSIS — M79674 Pain in right toe(s): Secondary | ICD-10-CM

## 2020-06-14 ENCOUNTER — Other Ambulatory Visit: Payer: Self-pay | Admitting: Family Medicine

## 2020-06-14 DIAGNOSIS — I10 Essential (primary) hypertension: Secondary | ICD-10-CM

## 2020-06-20 ENCOUNTER — Other Ambulatory Visit: Payer: Self-pay

## 2020-06-20 ENCOUNTER — Ambulatory Visit (INDEPENDENT_AMBULATORY_CARE_PROVIDER_SITE_OTHER): Payer: Medicare Other | Admitting: Vascular Surgery

## 2020-06-20 ENCOUNTER — Encounter (INDEPENDENT_AMBULATORY_CARE_PROVIDER_SITE_OTHER): Payer: Self-pay | Admitting: Vascular Surgery

## 2020-06-20 ENCOUNTER — Ambulatory Visit (INDEPENDENT_AMBULATORY_CARE_PROVIDER_SITE_OTHER): Payer: Medicare Other

## 2020-06-20 VITALS — BP 138/81 | HR 62 | Ht 70.0 in | Wt 255.0 lb

## 2020-06-20 DIAGNOSIS — E785 Hyperlipidemia, unspecified: Secondary | ICD-10-CM

## 2020-06-20 DIAGNOSIS — I890 Lymphedema, not elsewhere classified: Secondary | ICD-10-CM

## 2020-06-20 DIAGNOSIS — I872 Venous insufficiency (chronic) (peripheral): Secondary | ICD-10-CM

## 2020-06-20 DIAGNOSIS — M159 Polyosteoarthritis, unspecified: Secondary | ICD-10-CM

## 2020-06-20 DIAGNOSIS — M8949 Other hypertrophic osteoarthropathy, multiple sites: Secondary | ICD-10-CM

## 2020-06-20 DIAGNOSIS — I10 Essential (primary) hypertension: Secondary | ICD-10-CM

## 2020-06-20 NOTE — Progress Notes (Signed)
[name] : [id number]  Alan Campbell is a 70 y.o. ([birth year]) male who presents with chief complaint of  Chief Complaint  Patient presents with  . Follow-up    U/S  .  History of Present Illness:   The patient returns to the office for followup evaluation regarding leg swelling.  The swelling has improved quite a bit and the pain associated with swelling has decreased substantially. There have not been any interval development of a ulcerations or wounds.  He also notes that the Norvasc has been changed back to Verapamil  Since the previous visit the patient has been wearing graduated compression stockings and has noted little significant improvement in the lymphedema. The patient has been using compression routinely morning until night.  The patient also states elevation during the day and exercise is being done too.  Duplex ultrasound of the venous system shows mild reflux of the left leg only.   Current Meds  Medication Sig  . amLODipine (NORVASC) 10 MG tablet TAKE ONE TABLET BY MOUTH DAILY  . Cholecalciferol (VITAMIN D) 2000 units CAPS Take 1 capsule (2,000 Units total) by mouth daily.  . Cinnamon 500 MG TABS Take 2,000 mg by mouth.   . CRANBERRY PO Take 4,200 mg by mouth daily.  . diclofenac (VOLTAREN) 75 MG EC tablet Take 1 tablet (75 mg total) by mouth 2 (two) times daily.  . furosemide (LASIX) 20 MG tablet Take 1 tablet by mouth daily as needed.  Marland Kitchen levothyroxine (SYNTHROID) 150 MCG tablet TAKE ONE TABLET BY MOUTH DAILY  . Multiple Vitamins-Minerals (CENTRUM SILVER 50+MEN) TABS Take 1 tablet by mouth daily.  . OMEGA-3 FATTY ACIDS PO Take 1 tablet by mouth daily.  . potassium chloride SA (KLOR-CON) 20 MEQ tablet Take 1 tablet (20 mEq total) by mouth daily. Take only when you take furosemide  . rosuvastatin (CRESTOR) 20 MG tablet Take 1 tablet (20 mg total) by mouth daily.  . Testosterone 20.25 MG/ACT (1.62%) GEL Apply 2 each topically daily. 2 pumps daily  .  triamterene-hydrochlorothiazide (MAXZIDE-25) 37.5-25 MG tablet TAKE ONE TABLET BY MOUTH DAILY  . valsartan (DIOVAN) 320 MG tablet TAKE ONE TABLET BY MOUTH DAILY  . verapamil (CALAN-SR) 240 MG CR tablet Take 1 tablet (240 mg total) by mouth at bedtime.    Past Medical History:  Diagnosis Date  . Allergy   . Anemia, iron deficiency   . Cataract   . Dysplastic nevus 04/24/2010   Left low back. Moderate to severe atypia, edge involved.  . Hyperlipidemia   . Hypertension   . Low back pain   . Migraine   . Obesity   . OP (osteoporosis)   . Thyroid disease   . Vitamin D deficiency     Past Surgical History:  Procedure Laterality Date  . BACK SURGERY    . COLONOSCOPY WITH PROPOFOL N/A 08/14/2019   Procedure: COLONOSCOPY WITH PROPOFOL;  Surgeon: Toney Reil, MD;  Location: [facility] ENDOSCOPY;  Service: Gastroenterology;  Laterality: N/A;  PRIORITY 4  . EYE SURGERY  2016  . LAMINECTOMY  1985   L-5  . SPINE SURGERY  1985    Social History Social History   Tobacco Use  . Smoking status: Former Smoker    Packs/day: 0.50    Years: 5.00    Pack years: 2.50    Types: Cigarettes    Quit date: 10/03/1970    Years since quitting: 49.7  . Smokeless tobacco: Never Used  . Tobacco comment: Quit  1972  Vaping Use  . Vaping Use: Never used  Substance Use Topics  . Alcohol use: Yes    Alcohol/week: 4.0 standard drinks    Types: 4 Standard drinks or equivalent per week  . Drug use: No    Family History Family History  Problem Relation Age of Onset  . Heart disease Father   . Early death Father   . Hypothyroidism Sister   . Thyroid disease Sister   . Hypothyroidism Sister   . Migraines Sister   . Prostate cancer Neg Hx   . Bladder Cancer Neg Hx   . Kidney cancer Neg Hx     No Known Allergies   REVIEW OF SYSTEMS (Negative unless checked)  Constitutional: [] Weight loss  [] Fever  [] Chills Cardiac: [] Chest pain   [] Chest pressure   [] Palpitations   [] Shortness of breath  when laying flat   [] Shortness of breath with exertion. Vascular:  [] Pain in legs with walking   [] Pain in legs at rest  [] History of DVT   [] Phlebitis   [x] Swelling in legs   [] Varicose veins   [] Non-healing ulcers Pulmonary:   [] Uses home oxygen   [] Productive cough   [] Hemoptysis   [] Wheeze  [] COPD   [] Asthma Neurologic:  [] Dizziness   [] Seizures   [] History of stroke   [] History of TIA  [] Aphasia   [] Vissual changes   [] Weakness or numbness in arm   [] Weakness or numbness in leg Musculoskeletal:   [] Joint swelling   [x] Joint pain   [] Low back pain Hematologic:  [] Easy bruising  [] Easy bleeding   [] Hypercoagulable state   [] Anemic Gastrointestinal:  [] Diarrhea   [] Vomiting  [x] Gastroesophageal reflux/heartburn   [] Difficulty swallowing. Genitourinary:  [] Chronic kidney disease   [] Difficult urination  [] Frequent urination   [] Blood in urine Skin:  [] Rashes   [] Ulcers  Psychological:  [] History of anxiety   []  History of major depression.  Physical Examination  Vitals:   06/20/20 1108  BP: 138/81  Pulse: 62  Weight: 255 lb (115.7 kg)  Height: 5\' 10"  (1.778 m)   Body mass index is 36.59 kg/m. Gen: WD/WN, NAD Head: [place]/AT, No temporalis wasting.  Ear/Nose/Throat: Hearing grossly intact, nares w/o erythema or drainage Eyes: PER, EOMI, sclera nonicteric.  Neck: Supple, no large masses.   Pulmonary:  Good air movement, no audible wheezing bilaterally, no use of accessory muscles.  Cardiac: RRR, no JVD Vascular:  Vessel Right Left  Radial Palpable Palpable  Gastrointestinal: Non-distended. No guarding/no peritoneal signs.  Musculoskeletal: M/S 5/5 throughout.  No deformity or atrophy.  Neurologic: CN 2-12 intact. Symmetrical.  Speech is fluent. Motor exam as listed above. Psychiatric: Judgment intact, Mood & affect appropriate for pt's clinical situation. Dermatologic: No rashes or ulcers noted.  No changes consistent with cellulitis.   CBC Lab Results  Component Value Date   WBC  6.6 02/25/2020   HGB 13.3 02/25/2020   HCT 38.3 (L) 02/25/2020   MCV 92.1 02/25/2020   PLT 208 02/25/2020    BMET    Component Value Date/Time   NA 142 02/25/2020 0925   NA 146 (H) 11/29/2015 0842   K 3.9 02/25/2020 0925   CL 104 02/25/2020 0925   CO2 28 02/25/2020 0925   GLUCOSE 116 (H) 02/25/2020 0925   BUN 23 02/25/2020 0925   BUN 23 11/29/2015 0842   CREATININE 1.01 02/25/2020 0925   CALCIUM 9.8 02/25/2020 0925   GFRNONAA 76 02/25/2020 0925   GFRAA 88 02/25/2020 0925   CrCl cannot be calculated (  Patient's most recent lab result is older than the maximum 21 days allowed.).  COAG No results found for: INR, PROTIME  Radiology No results found.   Assessment/Plan 1. Lymphedema No surgery or intervention at this point in time.  I have reviewed my discussion with the patient regarding venous insufficiency and why it causes symptoms. I have discussed with the patient the chronic skin changes that accompany venous insufficiency and the long term sequela such as ulceration. Patient will contnue wearing graduated compression stockings on a daily basis, as this has provided excellent control of his edema. The patient will put the stockings on first thing in the morning and removing them in the evening. The patient is reminded not to sleep in the stockings.  In addition, behavioral modification including elevation during the day will be initiated. Exercise is strongly encouraged.  Duplex ultrasound of the bilateral lower extremity shows trivial reflux in the left leg only  Given the patient's good control and lack of any problems regarding the venous insufficiency and lymphedema a lymph pump in not need at this time.  The patient will follow up with me PRN should anything change.  The patient voices agreement with this plan.   2. Benign essential HTN Continue antihypertensive medications as already ordered, these medications have been reviewed and there are no changes at this  time.   3. Dyslipidemia Continue statin as ordered and reviewed, no changes at this time   4. Primary osteoarthritis involving multiple joints Continue NSAID medications as already ordered, these medications have been reviewed and there are no changes at this time.  Continued activity and therapy was stressed.    Hortencia Pilar, MD  06/20/2020 12:26 PM

## 2020-07-26 ENCOUNTER — Other Ambulatory Visit: Payer: Self-pay

## 2020-07-26 ENCOUNTER — Ambulatory Visit (INDEPENDENT_AMBULATORY_CARE_PROVIDER_SITE_OTHER): Payer: Medicare Other

## 2020-07-26 VITALS — BP 130/82 | HR 70 | Temp 98.3°F | Resp 16 | Ht 70.0 in | Wt 259.8 lb

## 2020-07-26 DIAGNOSIS — Z0000 Encounter for general adult medical examination without abnormal findings: Secondary | ICD-10-CM | POA: Diagnosis not present

## 2020-07-26 NOTE — Patient Instructions (Signed)
Alan Campbell , Thank you for taking time to come for your Medicare Wellness Visit. I appreciate your ongoing commitment to your health goals. Please review the following plan we discussed and let me know if I can assist you in the future.   Screening recommendations/referrals: Colonoscopy: done 08/14/19. Repeat in 2024 Recommended yearly ophthalmology/optometry visit for glaucoma screening and checkup Recommended yearly dental visit for hygiene and checkup  Vaccinations: Influenza vaccine: done 03/21/20 Pneumococcal vaccine: done 05/30/16 Tdap vaccine: done 06/10/19 Shingles vaccine: done 06/02/19; due for second dose. Complete at pharmacy    Covid-19: done 07/25/19, 08/15/19 & 03/14/20  Advanced directives: Please bring a copy of your health care power of attorney and living will to the office at your convenience.  Conditions/risks identified: Recommend increasing low impact physical activity   Next appointment: Follow up in one year for your annual wellness visit.   Preventive Care 71 Years and Older, Male Preventive care refers to lifestyle choices and visits with your health care provider that can promote health and wellness. What does preventive care include?  A yearly physical exam. This is also called an annual well check.  Dental exams once or twice a year.  Routine eye exams. Ask your health care provider how often you should have your eyes checked.  Personal lifestyle choices, including:  Daily care of your teeth and gums.  Regular physical activity.  Eating a healthy diet.  Avoiding tobacco and drug use.  Limiting alcohol use.  Practicing safe sex.  Taking low doses of aspirin every day.  Taking vitamin and mineral supplements as recommended by your health care provider. What happens during an annual well check? The services and screenings done by your health care provider during your annual well check will depend on your age, overall health, lifestyle risk factors,  and family history of disease. Counseling  Your health care provider may ask you questions about your:  Alcohol use.  Tobacco use.  Drug use.  Emotional well-being.  Home and relationship well-being.  Sexual activity.  Eating habits.  History of falls.  Memory and ability to understand (cognition).  Work and work Statistician. Screening  You may have the following tests or measurements:  Height, weight, and BMI.  Blood pressure.  Lipid and cholesterol levels. These may be checked every 5 years, or more frequently if you are over 71 years old.  Skin check.  Lung cancer screening. You may have this screening every year starting at age 71 if you have a 30-pack-year history of smoking and currently smoke or have quit within the past 15 years.  Fecal occult blood test (FOBT) of the stool. You may have this test every year starting at age 71.  Flexible sigmoidoscopy or colonoscopy. You may have a sigmoidoscopy every 5 years or a colonoscopy every 10 years starting at age 71.  Prostate cancer screening. Recommendations will vary depending on your family history and other risks.  Hepatitis C blood test.  Hepatitis B blood test.  Sexually transmitted disease (STD) testing.  Diabetes screening. This is done by checking your blood sugar (glucose) after you have not eaten for a while (fasting). You may have this done every 1-3 years.  Abdominal aortic aneurysm (AAA) screening. You may need this if you are a current or former smoker.  Osteoporosis. You may be screened starting at age 71 if you are at high risk. Talk with your health care provider about your test results, treatment options, and if necessary, the need for more  tests. Vaccines  Your health care provider may recommend certain vaccines, such as:  Influenza vaccine. This is recommended every year.  Tetanus, diphtheria, and acellular pertussis (Tdap, Td) vaccine. You may need a Td booster every 10  years.  Zoster vaccine. You may need this after age 71.  Pneumococcal 13-valent conjugate (PCV13) vaccine. One dose is recommended after age 71.  Pneumococcal polysaccharide (PPSV23) vaccine. One dose is recommended after age 71. Talk to your health care provider about which screenings and vaccines you need and how often you need them. This information is not intended to replace advice given to you by your health care provider. Make sure you discuss any questions you have with your health care provider. Document Released: 07/01/2015 Document Revised: 02/22/2016 Document Reviewed: 04/05/2015 Elsevier Interactive Patient Education  2017 [place] Prevention in the Home Falls can cause injuries. They can happen to people of all ages. There are many things you can do to make your home safe and to help prevent falls. What can I do on the outside of my home?  Regularly fix the edges of walkways and driveways and fix any cracks.  Remove anything that might make you trip as you walk through a door, such as a raised step or threshold.  Trim any bushes or trees on the path to your home.  Use bright outdoor lighting.  Clear any walking paths of anything that might make someone trip, such as rocks or tools.  Regularly check to see if handrails are loose or broken. Make sure that both sides of any steps have handrails.  Any raised decks and porches should have guardrails on the edges.  Have any leaves, snow, or ice cleared regularly.  Use sand or salt on walking paths during winter.  Clean up any spills in your garage right away. This includes oil or grease spills. What can I do in the bathroom?  Use night lights.  Install grab bars by the toilet and in the tub and shower. Do not use towel bars as grab bars.  Use non-skid mats or decals in the tub or shower.  If you need to sit down in the shower, use a plastic, non-slip stool.  Keep the floor dry. Clean up any water that  spills on the floor as soon as it happens.  Remove soap buildup in the tub or shower regularly.  Attach bath mats securely with double-sided non-slip rug tape.  Do not have throw rugs and other things on the floor that can make you trip. What can I do in the bedroom?  Use night lights.  Make sure that you have a light by your bed that is easy to reach.  Do not use any sheets or blankets that are too big for your bed. They should not hang down onto the floor.  Have a firm chair that has side arms. You can use this for support while you get dressed.  Do not have throw rugs and other things on the floor that can make you trip. What can I do in the kitchen?  Clean up any spills right away.  Avoid walking on wet floors.  Keep items that you use a lot in easy-to-reach places.  If you need to reach something above you, use a strong step stool that has a grab bar.  Keep electrical cords out of the way.  Do not use floor polish or wax that makes floors slippery. If you must use wax, use non-skid floor  wax.  Do not have throw rugs and other things on the floor that can make you trip. What can I do with my stairs?  Do not leave any items on the stairs.  Make sure that there are handrails on both sides of the stairs and use them. Fix handrails that are broken or loose. Make sure that handrails are as long as the stairways.  Check any carpeting to make sure that it is firmly attached to the stairs. Fix any carpet that is loose or worn.  Avoid having throw rugs at the top or bottom of the stairs. If you do have throw rugs, attach them to the floor with carpet tape.  Make sure that you have a light switch at the top of the stairs and the bottom of the stairs. If you do not have them, ask someone to add them for you. What else can I do to help prevent falls?  Wear shoes that:  Do not have high heels.  Have rubber bottoms.  Are comfortable and fit you well.  Are closed at the  toe. Do not wear sandals.  If you use a stepladder:  Make sure that it is fully opened. Do not climb a closed stepladder.  Make sure that both sides of the stepladder are locked into place.  Ask someone to hold it for you, if possible.  Clearly mark and make sure that you can see:  Any grab bars or handrails.  First and last steps.  Where the edge of each step is.  Use tools that help you move around (mobility aids) if they are needed. These include:  Canes.  Walkers.  Scooters.  Crutches.  Turn on the lights when you go into a dark area. Replace any light bulbs as soon as they burn out.  Set up your furniture so you have a clear path. Avoid moving your furniture around.  If any of your floors are uneven, fix them.  If there are any pets around you, be aware of where they are.  Review your medicines with your doctor. Some medicines can make you feel dizzy. This can increase your chance of falling. Ask your doctor what other things that you can do to help prevent falls. This information is not intended to replace advice given to you by your health care provider. Make sure you discuss any questions you have with your health care provider. Document Released: 03/31/2009 Document Revised: 11/10/2015 Document Reviewed: 07/09/2014 Elsevier Interactive Patient Education  2017 [employer].

## 2020-07-26 NOTE — Progress Notes (Signed)
Subjective:   NAQUAN GARMAN is a 70 y.o. male who presents for Initial Medicare Annual preventive examination.  Review of Systems     Cardiac Risk Factors include: advanced age (>66men, >72 women);male gender;obesity (BMI >30kg/m2);hypertension;dyslipidemia     Objective:    Today's Vitals   07/26/20 1016 07/26/20 1018  BP: 130/82   Pulse: 70   Resp: 16   Temp: 98.3 F (36.8 C)   TempSrc: Oral   SpO2: 98%   Weight: 259 lb 12.8 oz (117.8 kg)   Height: 5\' 10"  (1.778 m)   PainSc:  2    Body mass index is 37.28 kg/m.  Advanced Directives 07/26/2020 08/14/2019 09/07/2016 05/30/2016 02/27/2016 01/16/2016 11/04/2015  Does Patient Have a Medical Advance Directive? Yes Yes Yes Yes Yes Yes Yes  Type of Paramedic of [place];Living will - [place];Living will [place];Living will [place];Living will [place];Living will [place];Living will  Does patient want to make changes to medical advance directive? - - - - No - Patient declined No - Patient declined -  Copy of [place] in Chart? No - copy requested - No - copy requested No - copy requested No - copy requested No - copy requested No - copy requested    Current Medications (verified) Outpatient Encounter Medications as of 07/26/2020  Medication Sig  . amLODipine (NORVASC) 10 MG tablet TAKE ONE TABLET BY MOUTH DAILY  . Cholecalciferol (VITAMIN D) 2000 units CAPS Take 1 capsule (2,000 Units total) by mouth daily.  . Cinnamon 500 MG TABS Take 2,000 mg by mouth.   . CRANBERRY PO Take 4,200 mg by mouth daily.  . diclofenac (VOLTAREN) 75 MG EC tablet Take 1 tablet (75 mg total) by mouth 2 (two) times daily.  Marland Kitchen levothyroxine (SYNTHROID) 150 MCG tablet TAKE ONE TABLET BY MOUTH DAILY  . Multiple Vitamins-Minerals (CENTRUM SILVER 50+MEN) TABS Take 1 tablet by mouth daily.  . OMEGA-3 FATTY ACIDS PO Take  1 tablet by mouth daily.  . potassium chloride SA (KLOR-CON) 20 MEQ tablet Take 1 tablet (20 mEq total) by mouth daily. Take only when you take furosemide  . rosuvastatin (CRESTOR) 20 MG tablet Take 1 tablet (20 mg total) by mouth daily.  . Testosterone 20.25 MG/ACT (1.62%) GEL Apply 2 each topically daily. 2 pumps daily  . triamterene-hydrochlorothiazide (MAXZIDE-25) 37.5-25 MG tablet TAKE ONE TABLET BY MOUTH DAILY  . valsartan (DIOVAN) 320 MG tablet TAKE ONE TABLET BY MOUTH DAILY  . verapamil (CALAN-SR) 240 MG CR tablet Take 1 tablet (240 mg total) by mouth at bedtime.  . furosemide (LASIX) 20 MG tablet Take 1 tablet by mouth daily as needed.   No facility-administered encounter medications on file as of 07/26/2020.    Allergies (verified) Patient has no known allergies.   History: Past Medical History:  Diagnosis Date  . Allergy   . Anemia, iron deficiency   . Cataract   . Dysplastic nevus 04/24/2010   Left low back. Moderate to severe atypia, edge involved.  . Hyperlipidemia   . Hypertension   . Low back pain   . Migraine   . Obesity   . OP (osteoporosis)   . Thyroid disease   . Vitamin D deficiency    Past Surgical History:  Procedure Laterality Date  . BACK SURGERY    . COLONOSCOPY WITH PROPOFOL N/A 08/14/2019   Procedure: COLONOSCOPY WITH PROPOFOL;  Surgeon:  Lin Landsman, MD;  Location: [facility] ENDOSCOPY;  Service: Gastroenterology;  Laterality: N/A;  PRIORITY 4  . EYE SURGERY  2016   right cataract removal  . LAMINECTOMY  1985   L-5  . SPINE SURGERY  1985   Family History  Problem Relation Age of Onset  . Heart disease Father   . Early death Father   . Hypothyroidism Sister   . Thyroid disease Sister   . Hypothyroidism Sister   . Migraines Sister   . Prostate cancer Neg Hx   . Bladder Cancer Neg Hx   . Kidney cancer Neg Hx    Social History   Socioeconomic History  . Marital status: Married    Spouse name: donna  . Number of children: 0  . Years  of education: Not on file  . Highest education level: Not on file  Occupational History  . Occupation: managing/training   Tobacco Use  . Smoking status: Former Smoker    Packs/day: 0.50    Years: 5.00    Pack years: 2.50    Types: Cigarettes    Quit date: 10/03/1970    Years since quitting: 49.8  . Smokeless tobacco: Never Used  . Tobacco comment: Quit 1972  Vaping Use  . Vaping Use: Never used  Substance and Sexual Activity  . Alcohol use: Yes    Alcohol/week: 4.0 standard drinks    Types: 4 Standard drinks or equivalent per week  . Drug use: No  . Sexual activity: Yes    Partners: Female    Birth control/protection: Post-menopausal  Other Topics Concern  . Not on file  Social History Narrative   Married since 1974, no children but they helped raise Tretha Sciara ( wife's niece), also helped raise their God-daughter Lyndee Leo   He is planning on retiring January 2021   Social Determinants of Health   Financial Resource Strain: Low Risk   . Difficulty of Paying Living Expenses: Not hard at all  Food Insecurity: No Food Insecurity  . Worried About Charity fundraiser in the Last Year: Never true  . Ran Out of Food in the Last Year: Never true  Transportation Needs: No Transportation Needs  . Lack of Transportation (Medical): No  . Lack of Transportation (Non-Medical): No  Physical Activity: Inactive  . Days of Exercise per Week: 0 days  . Minutes of Exercise per Session: 0 min  Stress: No Stress Concern Present  . Feeling of Stress : Not at all  Social Connections: Socially Integrated  . Frequency of Communication with Friends and Family: More than three times a week  . Frequency of Social Gatherings with Friends and Family: Three times a week  . Attends Religious Services: More than 4 times per year  . Active Member of Clubs or Organizations: Yes  . Attends Archivist Meetings: More than 4 times per year  . Marital Status: Married    Tobacco  Counseling Counseling given: Not Answered Comment: Quit 1972   Clinical Intake:  Pre-visit preparation completed: Yes  Pain : 0-10 Pain Score: 2  Pain Type: Chronic pain Pain Location: Knee (arthritis) Pain Orientation: Right,Left Pain Descriptors / Indicators: Aching,Sore Pain Onset: More than a month ago Pain Frequency: Intermittent     BMI - recorded: 37.28 Nutritional Risks: None Diabetes: No  How often do you need to have someone help you when you read instructions, pamphlets, or other written materials from your doctor or pharmacy?: 1 - Never    Interpreter Needed?:  No  Information entered by :: Clemetine Marker LPN   Activities of Daily Living In your present state of health, do you have any difficulty performing the following activities: 07/26/2020 05/31/2020  Hearing? N N  Comment declines hearing aids -  Vision? N N  Difficulty concentrating or making decisions? N N  Walking or climbing stairs? N N  Dressing or bathing? N N  Doing errands, shopping? N N  Preparing Food and eating ? N -  Using the Toilet? N -  In the past six months, have you accidently leaked urine? N -  Do you have problems with loss of bowel control? N -  Managing your Medications? N -  Managing your Finances? N -  Housekeeping or managing your Housekeeping? N -  Some recent data might be hidden    Patient Care Team: [name], MD as PCP - General (Family Medicine) Yolonda Kida, MD as Consulting Physician (Cardiology) Hollice Espy, MD as Consulting Physician (Urology) [facility], [place] [facility] Hipps, Elmer Ramp., MD (Dentistry) [facility]  Indicate any recent Medical Services you may have received from other than [facility] providers in the past year (date may be approximate).     Assessment:   This is a routine wellness examination for [facility].  Hearing/Vision screen  Hearing Screening   125Hz  250Hz  500Hz  1000Hz  2000Hz  3000Hz  4000Hz  6000Hz  8000Hz    Right ear:           Left ear:           Comments: Pt denies hearing difficulty  Vision Screening Comments: Annual vision screenings done by Dr. Zadie Rhine  Dietary issues and exercise activities discussed: Current Exercise Habits: The patient does not participate in regular exercise at present, Exercise limited by: orthopedic condition(s)  Goals    . Increase physical activity     Recommend increasing low impact physical activity to at least 3 days per week       Depression Screen [facility] 2/9 Scores 07/26/2020 05/31/2020 02/29/2020 10/01/2019 07/23/2019 06/02/2019 04/10/2019  PHQ - 2 Score 0 0 0 0 0 0 0  PHQ- 9 Score - - - 0 0 0 0    Fall Risk Fall Risk  07/26/2020 05/31/2020 02/29/2020 10/01/2019 07/23/2019  Falls in the past year? 0 0 0 0 0  Number falls in past yr: 0 0 0 0 0  Injury with Fall? 0 0 0 0 0  Risk for fall due to : No Fall Risks - - - -  Follow up Falls prevention discussed - - Falls evaluation completed -    FALL RISK PREVENTION PERTAINING TO THE HOME:  Any stairs in or around the home? Yes  If so, are there any without handrails? No  Home free of loose throw rugs in walkways, pet beds, electrical cords, etc? Yes  Adequate lighting in your home to reduce risk of falls? Yes   ASSISTIVE DEVICES UTILIZED TO PREVENT FALLS:  Life alert? No  Use of a cane, walker or w/c? No  Grab bars in the bathroom? No  Shower chair or bench in shower? Yes  Elevated toilet seat or a handicapped toilet? No   TIMED UP AND GO:  Was the test performed? Yes .  Length of time to ambulate 10 feet: 5 sec.   Gait steady and fast without use of assistive device  Cognitive Function: Normal cognitive status assessed by direct observation by this Nurse Health Advisor. No abnormalities found.       6CIT  Screen 06/03/2017  What Year? 0 points  What month? 0 points  What time? 0 points  Count back from 20 0 points  Months in reverse 0 points  Repeat phrase 2 points  Total Score 2     Immunizations Immunization History  Administered Date(s) Administered  . Fluad Quad(high Dose 65+) 03/21/2020  . Influenza, High Dose Seasonal PF 03/18/2017, 02/21/2018, 04/20/2019  . Influenza, Seasonal, Injecte, Preservative Fre 04/21/2011, 03/25/2014  . Influenza-Unspecified 04/16/2015, 03/30/2016  . PFIZER(Purple Top)SARS-COV-2 Vaccination 07/25/2019, 08/15/2019, 03/14/2020  . Pneumococcal Conjugate-13 05/30/2016  . Pneumococcal Polysaccharide-23 12/24/2006, 05/06/2015  . Tdap 12/30/2009, 06/10/2019  . Zoster 03/28/2012  . Zoster Recombinat (Shingrix) 06/02/2019    TDAP status: Up to date  Flu Vaccine status: Up to date  Pneumococcal vaccine status: Up to date  Covid-19 vaccine status: Completed vaccines  Qualifies for Shingles Vaccine? Yes   Zostavax completed Yes   Shingrix Completed?: Yes - due for second dose  Screening Tests Health Maintenance  Topic Date Due  . COVID-19 Vaccine (4 - Booster for Pfizer series) 09/11/2020  . COLONOSCOPY (Pts 45-26yrs Insurance coverage will need to be confirmed)  08/13/2022  . TETANUS/TDAP  06/09/2029  . INFLUENZA VACCINE  Completed  . Hepatitis C Screening  Completed  . PNA vac Low Risk Adult  Completed    Health Maintenance  There are no preventive care reminders to display for this patient.  Colorectal cancer screening: Type of screening: Colonoscopy. Completed 08/14/19. Repeat every 3 years  Lung Cancer Screening: (Low Dose CT Chest recommended if Age 49-80 years, 30 pack-year currently smoking OR have quit w/in 15years.) does not qualify.   Additional Screening:  Hepatitis C Screening: does qualify; Completed 09/07/14  Vision Screening: Recommended annual ophthalmology exams for early detection of glaucoma and other disorders of the eye. Is the patient up to date with their annual eye exam?  Yes  Who is the provider or what is the name of the office in which the patient attends annual eye exams? Dr. Zadie Rhine    Dental Screening: Recommended annual dental exams for proper oral hygiene  Community Resource Referral / Chronic Care Management: CRR required this visit?  No   CCM required this visit?  No      Plan:     I have personally reviewed and noted the following in the patient's chart:   . Medical and social history . Use of alcohol, tobacco or illicit drugs  . Current medications and supplements . Functional ability and status . Nutritional status . Physical activity . Advanced directives . List of other physicians . Hospitalizations, surgeries, and ER visits in previous 12 months . Vitals . Screenings to include cognitive, depression, and falls . Referrals and appointments  In addition, I have reviewed and discussed with patient certain preventive protocols, quality metrics, and best practice recommendations. A written personalized care plan for preventive services as well as general preventive health recommendations were provided to patient.     Clemetine Marker, LPN   08/22/1060   Nurse Notes: none

## 2020-08-20 ENCOUNTER — Other Ambulatory Visit: Payer: Self-pay | Admitting: Family Medicine

## 2020-08-20 DIAGNOSIS — E039 Hypothyroidism, unspecified: Secondary | ICD-10-CM

## 2020-08-20 NOTE — Telephone Encounter (Signed)
Requested Prescriptions  Pending Prescriptions Disp Refills  . levothyroxine (SYNTHROID) 150 MCG tablet [Pharmacy Med Name: LEVOTHYROXINE 150 MCG TABLET] 90 tablet 3    Sig: TAKE ONE TABLET BY MOUTH DAILY     Endocrinology:  Hypothyroid Agents Failed - 08/20/2020 11:21 AM      Failed - TSH needs to be rechecked within 3 months after an abnormal result. Refill until TSH is due.      Passed - TSH in normal range and within 360 days    TSH  Date Value Ref Range Status  05/31/2020 2.47 0.40 - 4.50 mIU/L Final         Passed - Valid encounter within last 12 months    Recent Outpatient Visits          2 months ago Great toe pain, right   [facility] [name], MD   5 months ago Morbid obesity [facility])   [facility] [name], MD   10 months ago Dyslipidemia   [facility] [name], MD   1 year ago Benign essential HTN   [facility] [name], MD   1 year ago Well adult exam   [facility] [name], MD      Future Appointments            In 1 week [name], MD [facility], [place]   In 11 months  [facility], [facility]

## 2020-08-29 NOTE — Progress Notes (Signed)
Name: Alan Campbell   [name]: [id number]    DOB: 1950-09-01   Date:08/30/2020       Progress Note  Subjective  Chief Complaint  Follow up   HPI   Hypothyroidism: he is on levothyroxine 150 mcg seven days a week now,  last TSH was back to normal. Denies change in bowel movements , dry skin , no dysphagia.   Hypogonadism:last PSA was normal again 02/2020 Testosterone level is still low but he states symptoms of fatigue is controlled, libido is poor but wife had cancer and not very sexually active in the past 15 years.   He states two pumps seems to be good for him. Unchanged   OA  Right knee: seen by [facility] but not severe enough for surgery, takes otc medication prn for pain.   Metabolic syndrome: he was on Metformin for many years, but still had insulin resistance and weight was trending up, he has been on GLP agonistssince Spring 2018. He lost weight but has been off medication secondary to cost around January 2021 Gained 2 lbs since last visit, he states he will try to increase physical activity   HTN: taking medication daily and denies side effects,EKG was abnormalseen by Dr. Clayborn Bigness and had stress test and echo done 07/2017, mild right ventricular enlargement otherwise normal, he occasionally snores, deniesdecreased in exercise tolerance.He denies dizziness, chest pain or palpitation. He states lower extremity swelling is worse in pm, he takes maxzide but we will have to stop due to gout, we will try lasix BID with potassium and recheck labs next visit   Echo 2019 at Dr. Etta Quill office   NORMAL LEFT VENTRICULAR SYSTOLIC FUNCTION WITH AN ESTIMATED EF = >55 %  NORMAL RIGHT VENTRICULAR SYSTOLIC FUNCTION  MILD TRICUSPID AND MITRAL VALVE INSUFFICIENCY  NO VALVULAR STENOSIS  MILD RV ENLARGEMENT  MILD BIATRIAL ENLARGEMENT    Dyslipidemia: reviewed labs . HDL improved and LDL went up from 91 to 102. He states he has been taking medications, avoiding fried food , eating at  home, likely from uncontrolled hypothyroidism, since we are adjusting medication we will recheck labs next visit   OA both knees: going to [facility], stable Pain worse on right knee, mild on left knee, no effusion  Gout: two episodes since Fall of 2021, took diclofenac, uric acid high we will stop hctz and recheck next visit His wife will check his bp at home next week and we will adjust bp medication if needed   Morbid obesity: he has BMI above 35 with HTN, dyslipidemia , OA.   Patient Active Problem List   Diagnosis Date Noted  . Lymphedema 03/22/2020  . Chronic venous insufficiency 03/22/2020  . Morbid obesity ([place]) 02/29/2020  . Right epiretinal membrane 10/05/2019  . Posterior vitreous detachment of right eye 10/05/2019  . Nuclear sclerotic cataract of left eye 10/05/2019  . 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 Vitreomacular adhesion of left eye 10/05/2019  . Tendinitis of foot 09/15/2019  . Colon cancer screening   . Epididymal cyst 06/24/2016  . Anemia, iron deficiency 01/16/2016  . Benign essential HTN 12/30/2014  . Dyslipidemia 12/30/2014  . Adult hypothyroidism 12/30/2014  . Dysmetabolic syndrome 70/35/0093  . Obesity (BMI 30-39.9) 12/30/2014  . Arthritis, degenerative 12/30/2014  . Perennial allergic rhinitis with seasonal variation 12/30/2014  . 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  . Patella-femoral syndrome 12/03/2014  . Low back pain 04/03/2010  . Vitamin D deficiency 04/14/2009  . Abnormal  presence of protein in urine 09/12/2007  . OP (osteoporosis) 05/05/2007    Past Surgical History:  Procedure Laterality Date  . BACK SURGERY    . COLONOSCOPY WITH PROPOFOL N/A 08/14/2019   Procedure: COLONOSCOPY WITH PROPOFOL;  Surgeon: Lin Landsman, MD;  Location: [facility] ENDOSCOPY;  Service: Gastroenterology;  Laterality: N/A;  PRIORITY 4  . EYE SURGERY  2016   right cataract removal  . LAMINECTOMY  1985   L-5  .  SPINE SURGERY  1985    Family History  Problem Relation Age of Onset  . Heart disease Father   . Early death Father   . Hypothyroidism Sister   . Thyroid disease Sister   . Hypothyroidism Sister   . Migraines Sister   . Prostate cancer Neg Hx   . Bladder Cancer Neg Hx   . Kidney cancer Neg Hx     Social History   Tobacco Use  . Smoking status: Former Smoker    Packs/day: 0.50    Years: 5.00    Pack years: 2.50    Types: Cigarettes    Quit date: 10/03/1970    Years since quitting: 49.9  . Smokeless tobacco: Never Used  . Tobacco comment: Quit 1972  Substance Use Topics  . Alcohol use: Yes    Alcohol/week: 4.0 standard drinks    Types: 4 Standard drinks or equivalent per week     Current Outpatient Medications:  .  amLODipine (NORVASC) 10 MG tablet, TAKE ONE TABLET BY MOUTH DAILY, Disp: 90 tablet, Rfl: 1 .  Cholecalciferol (VITAMIN D) 2000 units CAPS, Take 1 capsule (2,000 Units total) by mouth daily., Disp: 30 capsule, Rfl: 0 .  Cinnamon 500 MG TABS, Take 2,000 mg by mouth. , Disp: , Rfl:  .  CRANBERRY PO, Take 4,200 mg by mouth daily., Disp: , Rfl:  .  diclofenac (VOLTAREN) 75 MG EC tablet, Take 1 tablet (75 mg total) by mouth 2 (two) times daily., Disp: 30 tablet, Rfl: 0 .  levothyroxine (SYNTHROID) 150 MCG tablet, TAKE ONE TABLET BY MOUTH DAILY, Disp: 90 tablet, Rfl: 3 .  Multiple Vitamins-Minerals (CENTRUM SILVER 50+MEN) TABS, Take 1 tablet by mouth daily., Disp: , Rfl:  .  OMEGA-3 FATTY ACIDS PO, Take 1 tablet by mouth daily., Disp: , Rfl:  .  potassium chloride SA (KLOR-CON) 20 MEQ tablet, Take 1 tablet (20 mEq total) by mouth daily. Take only when you take furosemide, Disp: 90 tablet, Rfl: 0 .  rosuvastatin (CRESTOR) 20 MG tablet, Take 1 tablet (20 mg total) by mouth daily., Disp: 90 tablet, Rfl: 1 .  Testosterone 20.25 MG/ACT (1.62%) GEL, Apply 2 each topically daily. 2 pumps daily, Disp: 75 g, Rfl: 5 .  triamterene-hydrochlorothiazide (MAXZIDE-25) 37.5-25 MG  tablet, TAKE ONE TABLET BY MOUTH DAILY, Disp: 90 tablet, Rfl: 1 .  valsartan (DIOVAN) 320 MG tablet, TAKE ONE TABLET BY MOUTH DAILY, Disp: 90 tablet, Rfl: 1 .  verapamil (CALAN-SR) 240 MG CR tablet, Take 1 tablet (240 mg total) by mouth at bedtime., Disp: 90 tablet, Rfl: 1 .  furosemide (LASIX) 20 MG tablet, Take 1 tablet by mouth daily as needed., Disp: , Rfl:   No Known Allergies  I personally reviewed active problem list, medication list, allergies, family history, social history, health maintenance with the patient/caregiver today.   ROS  Constitutional: Negative for fever or weight change.  Respiratory: Negative for cough and shortness of breath.   Cardiovascular: Negative for chest pain or palpitations.  Gastrointestinal: Negative for abdominal  pain, no bowel changes.  Musculoskeletal: Negative for gait problem or joint swelling.  Skin: Negative for rash.  Neurological: Negative for dizziness or headache.  No other specific complaints in a complete review of systems (except as listed in HPI above).  Objective  Vitals:   08/30/20 1139  BP: 136/78  Pulse: 71  Resp: 16  Temp: 98 F (36.7 C)  TempSrc: Oral  SpO2: 98%  Weight: 260 lb (117.9 kg)  Height: 5\' 10"  (1.778 m)    Body mass index is 37.31 kg/m.  Physical Exam  Constitutional: Patient appears well-developed and well-nourished. Obese No distress.  HEENT: head atraumatic, normocephalic, pupils equal and reactive to light, , neck supple Cardiovascular: Normal rate, regular rhythm and normal heart sounds.  No murmur heard. 1 plus BLE edema. Pulmonary/Chest: Effort normal and breath sounds normal. No respiratory distress. Abdominal: Soft.  There is no tenderness. Muscular skeletal: crepitus with extension of knees Psychiatric: Patient has a normal mood and affect. behavior is normal. Judgment and thought content normal.  PHQ2/9: Depression screen [facility] 2/9 08/30/2020 07/26/2020 05/31/2020 02/29/2020 10/01/2019   Decreased Interest 0 0 0 0 0  Down, Depressed, Hopeless 0 0 0 0 0  PHQ - 2 Score 0 0 0 0 0  Altered sleeping - - - - 0  Tired, decreased energy - - - - 0  Change in appetite - - - - 0  Feeling bad or failure about yourself  - - - - 0  Trouble concentrating - - - - 0  Moving slowly or fidgety/restless - - - - 0  Suicidal thoughts - - - - 0  PHQ-9 Score - - - - 0  Difficult doing work/chores - - - - Not difficult at all  Some recent data might be hidden    phq 9 is negative  Fall Risk: Fall Risk  08/30/2020 07/26/2020 05/31/2020 02/29/2020 10/01/2019  Falls in the past year? 0 0 0 0 0  Number falls in past yr: 0 0 0 0 0  Injury with Fall? 0 0 0 0 0  Risk for fall due to : - No Fall Risks - - -  Follow up - Falls prevention discussed - - Falls evaluation completed    Functional Status Survey: Is the patient deaf or have difficulty hearing?: No Does the patient have difficulty seeing, even when wearing glasses/contacts?: No Does the patient have difficulty concentrating, remembering, or making decisions?: No Does the patient have difficulty walking or climbing stairs?: No Does the patient have difficulty dressing or bathing?: No Does the patient have difficulty doing errands alone such as visiting a doctor's office or shopping?: No   Assessment & Plan  1. Morbid obesity (HCC)  BMI above 35 with co-morbidities , HTN, gout, metabolic syndrome   2. Dysmetabolic syndrome   3. Hypothyroidism, adult  Continue current dose, recheck labs next visit   4. Vitamin D deficiency   5. Benign essential HTN  - valsartan (DIOVAN) 320 MG tablet; Take 1 tablet (320 mg total) by mouth daily.  Dispense: 90 tablet; Refill: 1 - verapamil (CALAN-SR) 240 MG CR tablet; Take 1 tablet (240 mg total) by mouth at bedtime.  Dispense: 90 tablet; Refill: 1 - furosemide (LASIX) 20 MG tablet; Take 1 tablet (20 mg total) by mouth 2 (two) times daily.  Dispense: 180 tablet; Refill: 1 - potassium chloride  SA (KLOR-CON) 20 MEQ tablet; Take 1 tablet (20 mEq total) by mouth 2 (two) times daily. Take only when you  take furosemide  Dispense: 180 tablet; Refill: 1  6. Dyslipidemia  - rosuvastatin (CRESTOR) 20 MG tablet; Take 1 tablet (20 mg total) by mouth daily.  Dispense: 90 tablet; Refill: 1  7. Bilateral lower extremity edema  Stable   8. Chronic venous insufficiency   9. Long-term use of high-risk medication  - potassium chloride SA (KLOR-CON) 20 MEQ tablet; Take 1 tablet (20 mEq total) by mouth 2 (two) times daily. Take only when you take furosemide  Dispense: 180 tablet; Refill: 1   10. Gout involving toe of right foot, unspecified cause, unspecified chronicity  Stop maxzide, try using furosemide bid and monitor bp

## 2020-08-30 ENCOUNTER — Encounter: Payer: Self-pay | Admitting: Family Medicine

## 2020-08-30 ENCOUNTER — Ambulatory Visit (INDEPENDENT_AMBULATORY_CARE_PROVIDER_SITE_OTHER): Payer: Medicare Other | Admitting: Family Medicine

## 2020-08-30 ENCOUNTER — Other Ambulatory Visit: Payer: Self-pay

## 2020-08-30 DIAGNOSIS — E559 Vitamin D deficiency, unspecified: Secondary | ICD-10-CM | POA: Diagnosis not present

## 2020-08-30 DIAGNOSIS — M109 Gout, unspecified: Secondary | ICD-10-CM

## 2020-08-30 DIAGNOSIS — E8881 Metabolic syndrome: Secondary | ICD-10-CM

## 2020-08-30 DIAGNOSIS — I872 Venous insufficiency (chronic) (peripheral): Secondary | ICD-10-CM

## 2020-08-30 DIAGNOSIS — E039 Hypothyroidism, unspecified: Secondary | ICD-10-CM | POA: Diagnosis not present

## 2020-08-30 DIAGNOSIS — I10 Essential (primary) hypertension: Secondary | ICD-10-CM

## 2020-08-30 DIAGNOSIS — Z79899 Other long term (current) drug therapy: Secondary | ICD-10-CM

## 2020-08-30 DIAGNOSIS — E785 Hyperlipidemia, unspecified: Secondary | ICD-10-CM

## 2020-08-30 DIAGNOSIS — R600 Localized edema: Secondary | ICD-10-CM

## 2020-08-30 MED ORDER — FUROSEMIDE 20 MG PO TABS
20.0000 mg | ORAL_TABLET | Freq: Two times a day (BID) | ORAL | 1 refills | Status: DC
Start: 2020-08-30 — End: 2021-03-02

## 2020-08-30 MED ORDER — VERAPAMIL HCL ER 240 MG PO TBCR: 240.0000 mg | EXTENDED_RELEASE_TABLET | Freq: Every day | ORAL | 1 refills | Status: DC

## 2020-08-30 MED ORDER — VALSARTAN 320 MG PO TABS: 320.0000 mg | ORAL_TABLET | Freq: Every day | ORAL | 1 refills | Status: DC

## 2020-08-30 MED ORDER — POTASSIUM CHLORIDE CRYS ER 20 MEQ PO TBCR: 20.0000 meq | EXTENDED_RELEASE_TABLET | Freq: Two times a day (BID) | ORAL | 1 refills | Status: DC

## 2020-08-30 MED ORDER — ROSUVASTATIN CALCIUM 20 MG PO TABS: 20.0000 mg | ORAL_TABLET | Freq: Every day | ORAL | 1 refills | Status: DC

## 2020-10-04 ENCOUNTER — Encounter (INDEPENDENT_AMBULATORY_CARE_PROVIDER_SITE_OTHER): Payer: Self-pay | Admitting: Ophthalmology

## 2020-10-04 ENCOUNTER — Ambulatory Visit (INDEPENDENT_AMBULATORY_CARE_PROVIDER_SITE_OTHER): Payer: Medicare Other | Admitting: Ophthalmology

## 2020-10-04 ENCOUNTER — Other Ambulatory Visit: Payer: Self-pay

## 2020-10-04 DIAGNOSIS — H43812 Vitreous degeneration, left eye: Secondary | ICD-10-CM | POA: Diagnosis not present

## 2020-10-04 DIAGNOSIS — H2512 Age-related nuclear cataract, left eye: Secondary | ICD-10-CM

## 2020-10-04 DIAGNOSIS — H35371 Puckering of macula, right eye: Secondary | ICD-10-CM

## 2020-10-04 NOTE — Progress Notes (Signed)
10/04/2020     CHIEF COMPLAINT Patient presents for Retina Follow Up (1 Year F/U OU//Pt denies noticeable changes to [place] OU since last visit. Pt denies ocular pain, flashes of light, or floaters OU. //)   HISTORY OF PRESENT ILLNESS: Alan Campbell is a 70 y.o. male who presents to the clinic today for:   HPI    Retina Follow Up    Patient presents with  Other.  In both eyes.  This started 1 year ago.  Severity is mild.  Duration of 1 year.  Since onset it is stable. Additional comments: 1 Year F/U OU  Pt denies noticeable changes to [place] OU since last visit. Pt denies ocular pain, flashes of light, or floaters OU.          Last edited by Rockie Neighbours, [place] on 10/04/2020  1:14 PM. (History)      Referring physician: [name], MD [address] [place],  [place] [address]  HISTORICAL INFORMATION:   Selected notes from the MEDICAL RECORD NUMBER    Lab Results  Component Value Date   HGBA1C 4.9 02/25/2020     CURRENT MEDICATIONS: No current outpatient medications on file. (Ophthalmic Drugs)   No current facility-administered medications for this visit. (Ophthalmic Drugs)   Current Outpatient Medications (Other)  Medication Sig  . amLODipine (NORVASC) 10 MG tablet TAKE ONE TABLET BY MOUTH DAILY  . Cholecalciferol (VITAMIN D) 2000 units CAPS Take 1 capsule (2,000 Units total) by mouth daily.  . Cinnamon 500 MG TABS Take 2,000 mg by mouth.   . CRANBERRY PO Take 4,200 mg by mouth daily.  . diclofenac (VOLTAREN) 75 MG EC tablet Take 1 tablet (75 mg total) by mouth 2 (two) times daily.  . furosemide (LASIX) 20 MG tablet Take 1 tablet (20 mg total) by mouth 2 (two) times daily.  Marland Kitchen levothyroxine (SYNTHROID) 150 MCG tablet TAKE ONE TABLET BY MOUTH DAILY  . Multiple Vitamins-Minerals (CENTRUM SILVER 50+MEN) TABS Take 1 tablet by mouth daily.  . OMEGA-3 FATTY ACIDS PO Take 1 tablet by mouth daily.  . potassium chloride SA (KLOR-CON) 20 MEQ tablet Take 1 tablet (20  mEq total) by mouth 2 (two) times daily. Take only when you take furosemide  . rosuvastatin (CRESTOR) 20 MG tablet Take 1 tablet (20 mg total) by mouth daily.  . Testosterone 20.25 MG/ACT (1.62%) GEL Apply 2 each topically daily. 2 pumps daily  . valsartan (DIOVAN) 320 MG tablet Take 1 tablet (320 mg total) by mouth daily.  . verapamil (CALAN-SR) 240 MG CR tablet Take 1 tablet (240 mg total) by mouth at bedtime.   No current facility-administered medications for this visit. (Other)      REVIEW OF SYSTEMS:    ALLERGIES No Known Allergies  PAST MEDICAL HISTORY Past Medical History:  Diagnosis Date  . Allergy   . Anemia, iron deficiency   . Cataract   . Dysplastic nevus 04/24/2010   Left low back. Moderate to severe atypia, edge involved.  . Hyperlipidemia   . Hypertension   . Low back pain   . Migraine   . Obesity   . OP (osteoporosis)   . Thyroid disease   . Vitamin D deficiency   . Vitreomacular adhesion of left eye 10/05/2019   Past Surgical History:  Procedure Laterality Date  . BACK SURGERY    . COLONOSCOPY WITH PROPOFOL N/A 08/14/2019   Procedure: COLONOSCOPY WITH PROPOFOL;  Surgeon: Lin Landsman, MD;  Location: [facility]  ENDOSCOPY;  Service: Gastroenterology;  Laterality: N/A;  PRIORITY 4  . EYE SURGERY  2016   right cataract removal  . LAMINECTOMY  1985   L-5  . SPINE SURGERY  1985    FAMILY HISTORY Family History  Problem Relation Age of Onset  . Heart disease Father   . Early death Father   . Hypothyroidism Sister   . Thyroid disease Sister   . Hypothyroidism Sister   . Migraines Sister   . Prostate cancer Neg Hx   . Bladder Cancer Neg Hx   . Kidney cancer Neg Hx     SOCIAL HISTORY Social History   Tobacco Use  . Smoking status: Former Smoker    Packs/day: 0.50    Years: 5.00    Pack years: 2.50    Types: Cigarettes    Quit date: 10/03/1970    Years since quitting: 50.0  . Smokeless tobacco: Never Used  . Tobacco comment: Quit 1972   Vaping Use  . Vaping Use: Never used  Substance Use Topics  . Alcohol use: Yes    Alcohol/week: 4.0 standard drinks    Types: 4 Standard drinks or equivalent per week  . Drug use: No         OPHTHALMIC EXAM: Base Eye Exam    Visual Acuity (ETDRS)      Right Left   Dist [place] 20/20 -1 20/50 -2   Dist ph [place]  20/25       Tonometry (Tonopen, 1:18 PM)      Right Left   Pressure 18 16       Pupils      Pupils Dark Light Shape React APD   Right PERRL 3 2 Round Slow None   Left PERRL 3 2 Round Slow None       Visual Fields (Counting fingers)      Left Right    Full Full       Extraocular Movement      Right Left    Full Full       Neuro/Psych    Oriented x3: Yes   Mood/Affect: Normal       Dilation    Both eyes: 1.0% Mydriacyl, 2.5% Phenylephrine @ 1:18 PM        Slit Lamp and Fundus Exam    External Exam      Right Left   External Normal Normal       Slit Lamp Exam      Right Left   Lids/Lashes Normal Normal   Conjunctiva/Sclera White and quiet White and quiet   Cornea Clear Clear   Anterior Chamber Deep and quiet Deep and quiet   Iris Round and reactive Round and reactive   Lens Posterior chamber intraocular lens    Anterior Vitreous Normal Normal       Fundus Exam      Right Left   Posterior Vitreous Posterior vitreous detachment Posterior vitreous detachment   Disc Normal Normal   C/D Ratio 0.1 0.1   Macula Epiretinal membrane, no macular thickening Normal, no macular thickening   Vessels Normal Normal   Periphery Normal Normal          IMAGING AND PROCEDURES  Imaging and Procedures for 10/04/20  OCT, Retina - OU - Both Eyes       Right Eye Quality was good. Scan locations included subfoveal. Central Foveal Thickness: 339. Progression has been stable. Findings include epiretinal membrane.   Left Eye Quality was good. Scan  locations included subfoveal. Central Foveal Thickness: 297. Progression has been stable. Findings include  normal foveal contour.   Notes OD with none distorting epiretinal membrane in good acuity will continue to observe  OS with MAC adhesion present in April 2021 now resolved and PVD, no complication                ASSESSMENT/PLAN:  Nuclear sclerotic cataract of left eye The nature of cataract was discussed with the patient as well as the elective nature of surgery. The patient was reassured that surgery at a later date does not put the patient at risk for a worse outcome. It was emphasized that the need for surgery is dictated by the patient's quality of life as influenced by the cataract. Patient was instructed to maintain close follow up with their general eye care doctor.  OS progressing, patient understands he currently enjoys monovision with the left eye being his near vision.  He will seek reevaluation Dr. Vevelyn Royals in the near future and consideration of cataract surgery in the left eye when his symptoms dictate a need for improvement      ICD-10-CM   1. Right epiretinal membrane  H35.371 OCT, Retina - OU - Both Eyes  2. Posterior vitreous detachment, left eye  H43.812   3. Nuclear sclerotic cataract of left eye  H25.12     1.  No significant clinical progression to the epiretinal membrane right eye.  No impact on acuity.  We will continue to observe follow-up as needed or in a year to monitor  2.  Patient suggested to return to Dr. Vevelyn Royals in discussed the cataract in the left eye when his symptoms impact his quality living  3.  Ophthalmic Meds Ordered this visit:  No orders of the defined types were placed in this encounter.      Return in about 1 year (around 10/04/2021) for DILATE OU, OCT.  There are no Patient Instructions on file for this visit.   Explained the diagnoses, plan, and follow up with the patient and they expressed understanding.  Patient expressed understanding of the importance of proper follow up care.   Clent Demark Alma Mohiuddin  M.D. Diseases & Surgery of the Retina and Vitreous [facility]ina [facility] [place] 10/04/20     Abbreviations: M myopia (nearsighted); A astigmatism; H hyperopia (farsighted); P presbyopia; Mrx spectacle prescription;  CTL contact lenses; OD right eye; OS left eye; OU both eyes  XT exotropia; ET esotropia; PEK punctate epithelial keratitis; PEE punctate epithelial erosions; DES dry eye syndrome; MGD meibomian gland dysfunction; ATs artificial tears; PFAT's preservative free artificial tears; [place] nuclear sclerotic cataract; PSC posterior subcapsular cataract; ERM epi-retinal membrane; PVD posterior vitreous detachment; RD retinal detachment; DM diabetes mellitus; DR diabetic retinopathy; NPDR non-proliferative diabetic retinopathy; PDR proliferative diabetic retinopathy; CSME clinically significant macular edema; DME diabetic macular edema; dbh dot blot hemorrhages; CWS cotton wool spot; POAG primary open angle glaucoma; C/D cup-to-disc ratio; HVF humphrey visual field; GVF goldmann visual field; OCT optical coherence tomography; IOP intraocular pressure; BRVO Branch retinal vein occlusion; CRVO central retinal vein occlusion; CRAO central retinal artery occlusion; BRAO branch retinal artery occlusion; RT retinal tear; SB scleral buckle; PPV pars plana vitrectomy; VH Vitreous hemorrhage; PRP panretinal laser photocoagulation; IVK intravitreal kenalog; VMT vitreomacular traction; MH Macular hole;  NVD neovascularization of the disc; NVE neovascularization elsewhere; AREDS age related eye disease study; ARMD age related macular degeneration; POAG primary open angle glaucoma; EBMD epithelial/anterior basement membrane dystrophy; ACIOL anterior chamber  intraocular lens; IOL intraocular lens; PCIOL posterior chamber intraocular lens; Phaco/IOL phacoemulsification with intraocular lens placement; [place] photorefractive keratectomy; LASIK laser assisted in situ keratomileusis; HTN hypertension; DM diabetes mellitus; COPD  chronic obstructive pulmonary disease

## 2020-10-04 NOTE — Assessment & Plan Note (Signed)
The nature of cataract was discussed with the patient as well as the elective nature of surgery. The patient was reassured that surgery at a later date does not put the patient at risk for a worse outcome. It was emphasized that the need for surgery is dictated by the patient's quality of life as influenced by the cataract. Patient was instructed to maintain close follow up with their general eye care doctor.  OS progressing, patient understands he currently enjoys monovision with the left eye being his near vision.  He will seek reevaluation Dr. Vevelyn Royals in the near future and consideration of cataract surgery in the left eye when his symptoms dictate a need for improvement

## 2020-11-29 NOTE — Progress Notes (Addendum)
Name: Alan Campbell   [name]: [id number]    DOB: [birth year]   Date:11/30/2020       Progress Note  Subjective  Chief Complaint  Follow up   HPI  Hypothyroidism: he is on levothyroxine 150 mcg seven days a week now,  last TSH was back to normal. Denies change in bowel movements , dry skin , no dysphagia. We will recheck labs today    Hypogonadism: last PSA was normal again 02/2020 Testosterone level is still low but he states symptoms of fatigue is controlled, libido is poor but wife had cancer and not very sexually active in the past 15 years.   He states two pumps seems to be good for him. Stable   OA  Right knee: seen by [facility] but not severe enough for surgery, takes otc medication prn for pain.    Metabolic syndrome: he was on Metformin for many years, but still had insulin resistance and weight was trending up, he has been on GLP agonists since Spring 2018. He lost weight but has been off medication secondary to cost around January 2021 Weight is stable. Trying to eat smaller portions    HTN: taking medication daily and denies side effects, EKG was abnormal seen by Dr. Clayborn Bigness and had stress test and echo done 07/2017, mild right ventricular enlargement otherwise normal, he occasionally snores, denies decreased in exercise tolerance. He denies dizziness, chest pain or palpitation. He states lower extremity swelling is worse in pm, he is off Maxzide, on lasix bid due to lower extremity edema. We will check urine micro as suggested by his wife   Echo 2019 at Dr. Etta Quill office   NORMAL LEFT VENTRICULAR SYSTOLIC FUNCTION WITH AN ESTIMATED EF = >55 %  NORMAL RIGHT VENTRICULAR SYSTOLIC FUNCTION  MILD TRICUSPID AND MITRAL VALVE INSUFFICIENCY  NO VALVULAR STENOSIS  MILD RV ENLARGEMENT  MILD BIATRIAL ENLARGEMENT     Dyslipidemia: reviewed labs . HDL improved and LDL went up from 91 to 102. He states he has been taking medications, avoiding fried food. We will recheck next visit and  adjust dose if necessary   OA both knees: going to [facility], stable Pain worse on right knee, mild on left knee, no effusion. Stable   Gout: two episodes since Fall of 2021, took diclofenac, uric acid high we stopped HCTZ he changed his diet and we will recheck level today. He states his symptoms has been under control but had hot dogs over the weekend and had a flare yesterday   Morbid obesity: he has BMI above 35 with HTN, dyslipidemia , OA. His weight is stable.   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 10/05/2019   Tendinitis of foot 09/15/2019   Epididymal cyst 06/24/2016   Benign essential HTN 12/30/2014   Dyslipidemia 12/30/2014   Adult hypothyroidism 51/76/1607   Dysmetabolic syndrome 37/03/6268   Arthritis, degenerative 12/30/2014   Perennial allergic rhinitis with seasonal variation 12/30/2014   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   Patella-femoral syndrome 12/03/2014   Low back pain 04/03/2010   Vitamin D deficiency 04/14/2009   OP (osteoporosis) 05/05/2007    Past Surgical History:  Procedure Laterality Date   BACK SURGERY     COLONOSCOPY WITH PROPOFOL N/A 08/14/2019   Procedure: COLONOSCOPY WITH PROPOFOL;  Surgeon: Lin Landsman, MD;  [facility] END[facility]COPY;  Service: Gastroenterology;  Laterality: N/A;  PRIORITY 4   EYE SURGERY  2016   right cataract removal   LAMINECTOMY  1985   L-5   SPINE SURGERY  1985    Family History  Problem Relation Age of Onset   Heart disease Father    Early death Father    Hypothyroidism Sister    Thyroid disease Sister    Hypothyroidism  Sister    Migraines Sister    Prostate cancer Neg Hx    Bladder Cancer Neg Hx    Kidney cancer Neg Hx     Social History   Tobacco Use   Smoking status: Former    Packs/day: 0.50    Years: 5.00    Pack years: 2.50    Types: Cigarettes    Quit date: 10/03/1970    Years since quitting: 50.1   Smokeless tobacco: Never   Tobacco comments:    Quit 1972  Substance Use Topics   Alcohol use: Yes    Alcohol/week: 4.0 standard drinks    Types: 4 Standard drinks or equivalent per week     Current Outpatient Medications:    amLODipine (NORVASC) 10 MG tablet, TAKE ONE TABLET BY MOUTH DAILY, Disp: 90 tablet, Rfl: 1   chlorhexidine (PERIDEX) 0.12 % solution, SMARTSIG:By Mouth, Disp: , Rfl:    Cholecalciferol (VITAMIN D) 2000 units CAPS, Take 1 capsule (2,000 Units total) by mouth daily., Disp: 30 capsule, Rfl: 0   Cinnamon 500 MG TABS, Take 2,000 mg by mouth. , Disp: , Rfl:    CRANBERRY PO, Take 4,200 mg by mouth daily., Disp: , Rfl:    diclofenac (VOLTAREN) 75 MG EC tablet, Take 1 tablet (75 mg total) by mouth 2 (two) times daily., Disp: 30 tablet, Rfl: 0   furosemide (LASIX) 20 MG tablet, Take 1 tablet (20 mg total) by mouth 2 (two) times daily., Disp: 180 tablet, Rfl: 1   HYDROcodone-acetaminophen (NORCO/VICODIN) 5-325 MG tablet, Take by mouth., Disp: , Rfl:    ibuprofen (ADVIL) 400 MG tablet, Take by mouth., Disp: , Rfl:    levothyroxine (SYNTHROID) 150 MCG tablet, TAKE ONE TABLET BY MOUTH DAILY, Disp: 90 tablet, Rfl: 3   Multiple Vitamins-Minerals (CENTRUM SILVER 50+MEN) TABS, Take 1 tablet by mouth daily., Disp: , Rfl:    OMEGA-3 FATTY ACIDS PO, Take 1 tablet by mouth daily., Disp: , Rfl:    potassium chloride SA (KLOR-CON) 20 MEQ tablet, Take 1 tablet (20 mEq total) by mouth 2 (two) times daily. Take only when you take furosemide, Disp: 180 tablet, Rfl: 1   rosuvastatin (CRESTOR) 20 MG tablet, Take 1 tablet (20 mg total) by mouth daily., Disp: 90 tablet, Rfl: 1   Testosterone 20.25  MG/ACT (1.62%) GEL, Apply 2 each topically daily. 2 pumps daily, Disp: 75 g, Rfl: 5   valsartan (DIOVAN) 320 MG tablet, Take 1 tablet (320 mg total) by mouth daily., Disp: 90 tablet, Rfl: 1   verapamil (CALAN-SR) 240 MG CR tablet, Take 1 tablet (240 mg total) by mouth at bedtime., Disp: 90 tablet, Rfl: 1  No Known Allergies  I personally reviewed active problem list, medication list, allergies, family history, social history, health maintenance, notes from last encounter with the patient/caregiver today.   ROS  Constitutional: Negative for fever or weight change.  Respiratory:  Negative for cough and shortness of breath.   Cardiovascular: Negative for chest pain or palpitations.  Gastrointestinal: Negative for abdominal pain, no bowel changes.  Musculoskeletal: Negative for gait problem or joint swelling.  Skin: Negative for rash.  Neurological: Negative for dizziness or headache.  No other specific complaints in a complete review of systems (except as listed in HPI above).   Objective  Vitals:   11/30/20 1005  BP: 120/76  Pulse: 73  Resp: 16  Temp: 98.1 F (36.7 C)  TempSrc: Oral  SpO2: 97%  Weight: 258 lb (117 kg)  Height: 5\' 10"  (1.778 m)    Body mass index is 37.02 kg/m.  Physical Exam  Constitutional: Patient appears well-developed and well-nourished. Obese  No distress.  HEENT: head atraumatic, normocephalic, pupils equal and reactive to light, neck supple Cardiovascular: Normal rate, regular rhythm and normal heart sounds.  3/6 systolic ejection  murmur heard. Positive  BLE edema. Pulmonary/Chest: Effort normal and breath sounds normal. No respiratory distress. Abdominal: Soft.  There is no tenderness. Psychiatric: Patient has a normal mood and affect. behavior is normal. Judgment and thought content normal.   PHQ2/9: Depression screen [facility] 2/9 11/30/2020 08/30/2020 07/26/2020 05/31/2020 02/29/2020  Decreased Interest 0 0 0 0 0  Down, Depressed, Hopeless 0 0 0 0 0   PHQ - 2 Score 0 0 0 0 0  Altered sleeping - - - - -  Tired, decreased energy - - - - -  Change in appetite - - - - -  Feeling bad or failure about yourself  - - - - -  Trouble concentrating - - - - -  Moving slowly or fidgety/restless - - - - -  Suicidal thoughts - - - - -  PHQ-9 Score - - - - -  Difficult doing work/chores - - - - -  Some recent data might be hidden    phq 9 is negative   Fall Risk: Fall Risk  11/30/2020 08/30/2020 07/26/2020 05/31/2020 02/29/2020  Falls in the past year? 0 0 0 0 0  Number falls in past yr: 0 0 0 0 0  Injury with Fall? 0 0 0 0 0  Risk for fall due to : - - No Fall Risks - -  Follow up - - Falls prevention discussed - -     Functional Status Survey: Is the patient deaf or have difficulty hearing?: No Does the patient have difficulty seeing, even when wearing glasses/contacts?: No Does the patient have difficulty concentrating, remembering, or making decisions?: No Does the patient have difficulty walking or climbing stairs?: No Does the patient have difficulty dressing or bathing?: No Does the patient have difficulty doing errands alone such as visiting a doctor's office or shopping?: No    Assessment & Plan  1. Morbid obesity ([place])  Discussed with the patient the risk posed by an increased BMI. Discussed importance of portion control, calorie counting and at least 150 minutes of physical activity weekly. Avoid sweet beverages and drink more water. Eat at least 6 servings of fruit and vegetables daily    2. Vitamin D deficiency  Continue supplementation   3. Benign essential HTN  - COMPLETE METABOLIC PANEL WITH GFR - CBC with Differential/Platelet - Microalbumin / creatinine urine ratio - amLODipine (NORVASC) 10 MG tablet; Take 1 tablet (10 mg total) by mouth daily.  Dispense: 90 tablet; Refill: 1  4. Dyslipidemia   5. Hypothyroidism, adult  - TSH  6. Dysmetabolic syndrome   7. Chronic  venous insufficiency   8. Controlled  gout  Recheck uric acid   9. Lymphedema   10. Hypogonadism in male  - Testosterone 20.25 MG/ACT (1.62%) GEL; Apply 2 each topically daily. 2 pumps daily  Dispense: 75 g; Refill: 5

## 2020-11-30 ENCOUNTER — Other Ambulatory Visit: Payer: Self-pay

## 2020-11-30 ENCOUNTER — Ambulatory Visit (INDEPENDENT_AMBULATORY_CARE_PROVIDER_SITE_OTHER): Payer: Medicare Other | Admitting: Family Medicine

## 2020-11-30 ENCOUNTER — Encounter: Payer: Self-pay | Admitting: Family Medicine

## 2020-11-30 DIAGNOSIS — E785 Hyperlipidemia, unspecified: Secondary | ICD-10-CM

## 2020-11-30 DIAGNOSIS — I890 Lymphedema, not elsewhere classified: Secondary | ICD-10-CM

## 2020-11-30 DIAGNOSIS — I10 Essential (primary) hypertension: Secondary | ICD-10-CM

## 2020-11-30 DIAGNOSIS — E039 Hypothyroidism, unspecified: Secondary | ICD-10-CM

## 2020-11-30 DIAGNOSIS — M109 Gout, unspecified: Secondary | ICD-10-CM

## 2020-11-30 DIAGNOSIS — E559 Vitamin D deficiency, unspecified: Secondary | ICD-10-CM | POA: Diagnosis not present

## 2020-11-30 DIAGNOSIS — I872 Venous insufficiency (chronic) (peripheral): Secondary | ICD-10-CM

## 2020-11-30 DIAGNOSIS — E8881 Metabolic syndrome: Secondary | ICD-10-CM

## 2020-11-30 DIAGNOSIS — E291 Testicular hypofunction: Secondary | ICD-10-CM

## 2020-11-30 MED ORDER — TESTOSTERONE 20.25 MG/ACT (1.62%) TD GEL: 2.0000 | Freq: Every day | TRANSDERMAL | 5 refills | Status: AC

## 2020-11-30 MED ORDER — AMLODIPINE BESYLATE 10 MG PO TABS: 1.0000 | ORAL_TABLET | Freq: Every day | ORAL | 1 refills | Status: DC

## 2020-11-30 NOTE — Patient Instructions (Signed)
Gout  Gout is a condition that causes painful swelling of the joints. Gout is a type of inflammation of the joints (arthritis). This condition is caused by having too much uric acid in the body. Uric acid is a chemical that forms when the body breaks down substances called purines.Purines are important for building body proteins. When the body has too much uric acid, sharp crystals can form and build up inside the joints. This causes pain and swelling. Gout attacks can happen quickly and may be very painful (acute gout). Over time, the attacks can affect more joints and become more frequent (chronic gout). Gout can also cause uric acid to build up under the skin and inside thekidneys. What are the causes? This condition is caused by too much uric acid in your blood. This can happen because: Your kidneys do not remove enough uric acid from your blood. This is the most common cause. Your body makes too much uric acid. This can happen with some cancers and cancer treatments. It can also occur if your body is breaking down too many red blood cells (hemolytic anemia). You eat too many foods that are high in purines. These foods include organ meats and some seafood. Alcohol, especially beer, is also high in purines. A gout attack may be triggered by trauma or stress. What increases the risk? You are more likely to develop this condition if you: Have a family history of gout. Are male and middle-aged. Are male and have gone through menopause. Are obese. Frequently drink alcohol, especially beer. Are dehydrated. Lose weight too quickly. Have an organ transplant. Have lead poisoning. Take certain medicines, including aspirin, cyclosporine, diuretics, levodopa, and niacin. Have kidney disease. Have a skin condition called psoriasis. What are the signs or symptoms? An attack of acute gout happens quickly. It usually occurs in just one joint. The most common place is the big toe. Attacks often start  at night. Other joints that may be affected include joints of the feet, ankle, knee, fingers, wrist, or elbow. Symptoms of this condition may include: Severe pain. Warmth. Swelling. Stiffness. Tenderness. The affected joint may be very painful to touch. Shiny, red, or purple skin. Chills and fever. Chronic gout may cause symptoms more frequently. More joints may be involved. You may also have white or yellow lumps (tophi) on your hands or feet or in other areas near your joints. How is this diagnosed? This condition is diagnosed based on your symptoms, medical history, and physical exam. You may have tests, such as: Blood tests to measure uric acid levels. Removal of joint fluid with a thin needle (aspiration) to look for uric acid crystals. X-rays to look for joint damage. How is this treated? Treatment for this condition has two phases: treating an acute attack and preventing future attacks. Acute gout treatment may include medicines to reduce pain and swelling, including: NSAIDs. Steroids. These are strong anti-inflammatory medicines that can be taken by mouth (orally) or injected into a joint. Colchicine. This medicine relieves pain and swelling when it is taken soon after an attack. It can be given by mouth or through an IV. Preventive treatment may include: Daily use of smaller doses of NSAIDs or colchicine. Use of a medicine that reduces uric acid levels in your blood. Changes to your diet. You may need to see a dietitian about what to eat and drink to prevent gout. Follow these instructions at home: During a gout attack  If directed, put ice on the affected area: Put   ice in a plastic bag. Place a towel between your skin and the bag. Leave the ice on for 20 minutes, 2-3 times a day. Raise (elevate) the affected joint above the level of your heart as often as possible. Rest the joint as much as possible. If the affected joint is in your leg, you may be given crutches to  use. Follow instructions from your health care provider about eating or drinking restrictions.  Avoiding future gout attacks Follow a low-purine diet as told by your dietitian or health care provider. Avoid foods and drinks that are high in purines, including liver, kidney, anchovies, asparagus, herring, mushrooms, mussels, and beer. Maintain a healthy weight or lose weight if you are overweight. If you want to lose weight, talk with your health care provider. It is important that you do not lose weight too quickly. Start or maintain an exercise program as told by your health care provider. Eating and drinking Drink enough fluids to keep your urine pale yellow. If you drink alcohol: Limit how much you use to: 0-1 drink a day for women. 0-2 drinks a day for men. Be aware of how much alcohol is in your drink. In the [place] one drink equals one 12 oz bottle of beer (355 mL) one 5 oz glass of wine (148 mL), or one 1 oz glass of hard liquor (44 mL). General instructions Take over-the-counter and prescription medicines only as told by your health care provider. Do not drive or use heavy machinery while taking prescription pain medicine. Return to your normal activities as told by your health care provider. Ask your health care provider what activities are safe for you. Keep all follow-up visits as told by your health care provider. This is important. Contact a health care provider if you have: Another gout attack. Continuing symptoms of a gout attack after 10 days of treatment. Side effects from your medicines. Chills or a fever. Burning pain when you urinate. Pain in your lower back or belly. Get help right away if you: Have severe or uncontrolled pain. Cannot urinate. Summary Gout is painful swelling of the joints caused by inflammation. The most common site of pain is the big toe, but it can affect other joints in the body. Medicines and dietary changes can help to prevent and treat gout  attacks. This information is not intended to replace advice given to you by your health care provider. Make sure you discuss any questions you have with your healthcare provider. Document Revised: 12/25/2017 Document Reviewed: 12/25/2017 Elsevier Patient Education  2022 [employer]  

## 2020-12-01 LAB — COMPLETE METABOLIC PANEL WITH GFR
AG Ratio: 2.1 (calc) (ref 1.0–2.5)
ALT: 33 U/L (ref 9–46)
AST: 20 U/L (ref 10–35)
Albumin: 4.5 g/dL (ref 3.6–5.1)
Alkaline phosphatase (APISO): 80 U/L (ref 35–144)
BUN: 22 mg/dL (ref 7–25)
CO2: 25 mmol/L (ref 20–32)
Calcium: 9.6 mg/dL (ref 8.6–10.3)
Chloride: 107 mmol/L (ref 98–110)
Creat: 1.11 mg/dL (ref 0.70–1.18)
GFR, Est African American: 78 mL/min/{1.73_m2} (ref >=60)
GFR, Est Non African American: 67 mL/min/{1.73_m2} (ref >=60)
Globulin: 2.1 g/dL (calc) (ref 1.9–3.7)
Glucose, Bld: 101 mg/dL — ABNORMAL HIGH (ref 65–99)
Potassium: 4.4 mmol/L (ref 3.5–5.3)
Sodium: 141 mmol/L (ref 135–146)
Total Bilirubin: 0.8 mg/dL (ref 0.2–1.2)
Total Protein: 6.6 g/dL (ref 6.1–8.1)

## 2020-12-01 LAB — CBC WITH DIFFERENTIAL/PLATELET
Absolute Monocytes: 450 cells/uL (ref 200–950)
Basophils Absolute: 47 cells/uL (ref 0–200)
Basophils Relative: 0.6 %
Eosinophils Absolute: 119 cells/uL (ref 15–500)
Eosinophils Relative: 1.5 %
HCT: 39.4 % (ref 38.5–50.0)
Hemoglobin: 13.5 g/dL (ref 13.2–17.1)
Lymphs Abs: 1548 cells/uL (ref 850–3900)
MCH: 31.3 pg (ref 27.0–33.0)
MCHC: 34.3 g/dL (ref 32.0–36.0)
MCV: 91.2 fL (ref 80.0–100.0)
MPV: 9.4 fL (ref 7.5–12.5)
Monocytes Relative: 5.7 %
Neutro Abs: 5735 cells/uL (ref 1500–7800)
Neutrophils Relative %: 72.6 %
Platelets: 217 10*3/uL (ref 140–400)
RBC: 4.32 10*6/uL (ref 4.20–5.80)
RDW: 13.6 % (ref 11.0–15.0)
Total Lymphocyte: 19.6 %
WBC: 7.9 10*3/uL (ref 3.8–10.8)

## 2020-12-01 LAB — URIC ACID: Uric Acid, Serum: 7.1 mg/dL (ref 4.0–8.0)

## 2020-12-01 LAB — TSH: TSH: 2.59 mIU/L (ref 0.40–4.50)

## 2020-12-02 LAB — MICROALBUMIN / CREATININE URINE RATIO
Creatinine, Urine: 288 mg/dL (ref 20–320)
Microalb Creat Ratio: 741 mcg/mg creat — ABNORMAL HIGH (ref <30)
Microalb, Ur: 213.5 mg/dL

## 2021-01-08 ENCOUNTER — Encounter: Payer: Self-pay | Admitting: Family Medicine

## 2021-01-09 ENCOUNTER — Other Ambulatory Visit: Payer: Self-pay | Admitting: Family Medicine

## 2021-01-09 DIAGNOSIS — R809 Proteinuria, unspecified: Secondary | ICD-10-CM

## 2021-01-16 ENCOUNTER — Encounter: Payer: Medicare Other | Admitting: Dermatology

## 2021-02-06 ENCOUNTER — Other Ambulatory Visit: Payer: Self-pay

## 2021-02-06 ENCOUNTER — Ambulatory Visit (INDEPENDENT_AMBULATORY_CARE_PROVIDER_SITE_OTHER): Payer: Medicare Other | Admitting: Dermatology

## 2021-02-06 DIAGNOSIS — L820 Inflamed seborrheic keratosis: Secondary | ICD-10-CM | POA: Diagnosis not present

## 2021-02-06 DIAGNOSIS — D1800 Hemangioma unspecified site: Secondary | ICD-10-CM

## 2021-02-06 DIAGNOSIS — L814 Other melanin hyperpigmentation: Secondary | ICD-10-CM | POA: Diagnosis not present

## 2021-02-06 DIAGNOSIS — L918 Other hypertrophic disorders of the skin: Secondary | ICD-10-CM

## 2021-02-06 DIAGNOSIS — L821 Other seborrheic keratosis: Secondary | ICD-10-CM

## 2021-02-06 DIAGNOSIS — L578 Other skin changes due to chronic exposure to nonionizing radiation: Secondary | ICD-10-CM | POA: Diagnosis not present

## 2021-02-06 DIAGNOSIS — Z1283 Encounter for screening for malignant neoplasm of skin: Secondary | ICD-10-CM

## 2021-02-06 DIAGNOSIS — D229 Melanocytic nevi, unspecified: Secondary | ICD-10-CM

## 2021-02-06 DIAGNOSIS — Z86018 Personal history of other benign neoplasm: Secondary | ICD-10-CM

## 2021-02-06 NOTE — Patient Instructions (Signed)
Seborrheic Keratosis  What causes seborrheic keratoses? Seborrheic keratoses are harmless, common skin growths that first appear during adult life.  As time goes by, more growths appear.  Some people may develop a large number of them.  Seborrheic keratoses appear on both covered and uncovered body parts.  They are not caused by sunlight.  The tendency to develop seborrheic keratoses can be inherited.  They vary in color from skin-colored to gray, brown, or even black.  They can be either smooth or have a rough, warty surface.   Seborrheic keratoses are superficial and look as if they were stuck on the skin.  Under the microscope this type of keratosis looks like layers upon layers of skin.  That is why at times the top layer may seem to fall off, but the rest of the growth remains and re-grows.    Treatment Seborrheic keratoses do not need to be treated, but can easily be removed in the office.  Seborrheic keratoses often cause symptoms when they rub on clothing or jewelry.  Lesions can be in the way of shaving.  If they become inflamed, they can cause itching, soreness, or burning.  Removal of a seborrheic keratosis can be accomplished by freezing, burning, or surgery. If any spot bleeds, scabs, or grows rapidly, please return to have it checked, as these can be an indication of a skin cancer.  Cryotherapy Aftercare  Wash gently with soap and water everyday.   Apply Vaseline and Band-Aid daily until healed.   Melanoma ABCDEs  Melanoma is the most dangerous type of skin cancer, and is the leading cause of death from skin disease.  You are more likely to develop melanoma if you: Have light-colored skin, light-colored eyes, or red or blond hair Spend a lot of time in the sun Tan regularly, either outdoors or in a tanning bed Have had blistering sunburns, especially during childhood Have a close family member who has had a melanoma Have atypical moles or large birthmarks  Early detection of  melanoma is key since treatment is typically straightforward and cure rates are extremely high if we catch it early.   The first sign of melanoma is often a change in a mole or a new dark spot.  The ABCDE system is a way of remembering the signs of melanoma.  A for asymmetry:  The two halves do not match. B for border:  The edges of the growth are irregular. C for color:  A mixture of colors are present instead of an even brown color. D for diameter:  Melanomas are usually (but not always) greater than 16m - the size of a pencil eraser. E for evolution:  The spot keeps changing in size, shape, and color.  Please check your skin once per month between visits. You can use a small mirror in front and a large mirror behind you to keep an eye on the back side or your body.   If you see any new or changing lesions before your next follow-up, please call to schedule a visit.  Please continue daily skin protection including broad spectrum sunscreen SPF 30+ to sun-exposed areas, reapplying every 2 hours as needed when you're outdoors.   Staying in the shade or wearing long sleeves, sun glasses (UVA+UVB protection) and wide brim hats (4-inch brim around the entire circumference of the hat) are also recommended for sun protection.    If you have any questions or concerns for your doctor, please call our main line at [phone]  and press option 4 to reach your doctor's medical assistant. If no one answers, please leave a voicemail as directed and we will return your call as soon as possible. Messages left after 4 pm will be answered the following business day.   You may also send us a message via MyChart. We typically respond to MyChart messages within 1-2 business days.  For prescription refills, please ask your pharmacy to contact our office. Our fax number is [phone].  If you have an urgent issue when the clinic is closed that cannot wait until the next business day, you can page your doctor at  the number below.    Please note that while we do our best to be available for urgent issues outside of office hours, we are not available 24/7.   If you have an urgent issue and are unable to reach us, you may choose to seek medical care at your doctor's office, retail clinic, urgent care center, or emergency room.  If you have a medical emergency, please immediately call 911 or go to the emergency department.  Pager Numbers  - Dr. Kowalski: [phone]  - Dr. Moye: [phone]  - Dr. Stewart: [phone]  In the event of inclement weather, please call our main line at [phone] for an update on the status of any delays or closures.  Dermatology Medication Tips: Please keep the boxes that topical medications come in in order to help keep track of the instructions about where and how to use these. Pharmacies typically print the medication instructions only on the boxes and not directly on the medication tubes.   If your medication is too expensive, please contact our office at [phone] option 4 or send us a message through MyChart.   We are unable to tell what your co-pay for medications will be in advance as this is different depending on your insurance coverage. However, we may be able to find a substitute medication at lower cost or fill out paperwork to get insurance to cover a needed medication.   If a prior authorization is required to get your medication covered by your insurance company, please allow us 1-2 business days to complete this process.  Drug prices often vary depending on where the prescription is filled and some pharmacies may offer cheaper prices.  The website www.goodrx.com contains coupons for medications through different pharmacies. The prices here do not account for what the cost may be with help from insurance (it may be cheaper with your insurance), but the website can give you the price if you did not use any insurance.  - You can print the  associated coupon and take it with your prescription to the pharmacy.  - You may also stop by our office during regular business hours and pick up a GoodRx coupon card.  - If you need your prescription sent electronically to a different pharmacy, notify our office through  MyChart or by phone at [phone] option 4.  

## 2021-02-06 NOTE — Progress Notes (Signed)
Follow-Up Visit   Subjective  Alan Campbell is a 70 y.o. male who presents for the following: Annual Exam (Patient here today for full body exam. He reports a spot at left side he would like checked. ).  Patient here for full body skin exam and skin cancer screening.  The following portions of the chart were reviewed this encounter and updated as appropriate:  Tobacco  Allergies  Meds  Problems  Med Hx  Surg Hx  Fam Hx     Objective  Well appearing patient in no apparent distress; mood and affect are within normal limits.  A full examination was performed including scalp, head, eyes, ears, nose, lips, neck, chest, axillae, abdomen, back, buttocks, bilateral upper extremities, bilateral lower extremities, hands, feet, fingers, toes, fingernails, and toenails. All findings within normal limits unless otherwise noted below.  left side x 1 Erythematous keratotic or waxy stuck-on papule or plaque.   Assessment & Plan  Inflamed seborrheic keratosis left side x 1 Prior to procedure, discussed risks of blister formation, small wound, skin dyspigmentation, or rare scar following cryotherapy. Recommend Vaseline ointment to treated areas while healing.  Destruction of lesion - left side x 1 Complexity: simple   Destruction method: cryotherapy   Informed consent: discussed and consent obtained   Timeout:  patient name, date of birth, surgical site, and procedure verified Lesion destroyed using liquid nitrogen: Yes   Region frozen until ice ball extended beyond lesion: Yes   Outcome: patient tolerated procedure well with no complications   Post-procedure details: wound care instructions given    Skin cancer screening  Lentigines - Scattered tan macules - Due to sun exposure - Benign-appering, observe - Recommend daily broad spectrum sunscreen SPF 30+ to sun-exposed areas, reapply every 2 hours as needed. - Call for any changes  Acrochordons (Skin Tags) - Fleshy, skin-colored  pedunculated papules at axilla - Benign appearing.  - Observe. - If desired, they can be removed with an in office procedure that is not covered by insurance. - Please call the clinic if you notice any new or changing lesions.  Seborrheic Keratoses - Stuck-on, waxy, tan-brown papules and/or plaques  - Benign-appearing - Discussed benign etiology and prognosis. - Observe - Call for any changes  Melanocytic Nevi - Tan-brown and/or pink-flesh-colored symmetric macules and papules - Benign appearing on exam today - Observation - Call clinic for new or changing moles - Recommend daily use of broad spectrum spf 30+ sunscreen to sun-exposed areas.   Hemangiomas - Red papules - Discussed benign nature - Observe - Call for any changes  Actinic Damage - Chronic condition, secondary to cumulative UV/sun exposure - diffuse scaly erythematous macules with underlying dyspigmentation - Recommend daily broad spectrum sunscreen SPF 30+ to sun-exposed areas, reapply every 2 hours as needed.  - Staying in the shade or wearing long sleeves, sun glasses (UVA+UVB protection) and wide brim hats (4-inch brim around the entire circumference of the hat) are also recommended for sun protection.  - Call for new or changing lesions.  History of Dysplastic Nevi - No evidence of recurrence today left low back 2011 - Recommend regular full body skin exams - Recommend daily broad spectrum sunscreen SPF 30+ to sun-exposed areas, reapply every 2 hours as needed.  - Call if any new or changing lesions are noted between office visits  Skin cancer screening performed today.  Return in about 1 year (around 02/06/2022) for tbse. I, Ruthell Rummage, CMA, am acting as scribe for [employer]  Nehemiah Massed, MD. Documentation: I have reviewed the above documentation for accuracy and completeness, and I agree with the above.  Sarina Ser, MD

## 2021-02-07 ENCOUNTER — Encounter: Payer: Self-pay | Admitting: Dermatology

## 2021-02-14 DIAGNOSIS — I10 Essential (primary) hypertension: Secondary | ICD-10-CM | POA: Insufficient documentation

## 2021-02-14 DIAGNOSIS — D649 Anemia, unspecified: Secondary | ICD-10-CM | POA: Insufficient documentation

## 2021-02-16 ENCOUNTER — Other Ambulatory Visit: Payer: Self-pay | Admitting: Nephrology

## 2021-02-16 ENCOUNTER — Encounter: Payer: Self-pay | Admitting: Family Medicine

## 2021-02-16 DIAGNOSIS — E785 Hyperlipidemia, unspecified: Secondary | ICD-10-CM

## 2021-02-16 DIAGNOSIS — R809 Proteinuria, unspecified: Secondary | ICD-10-CM

## 2021-02-23 ENCOUNTER — Ambulatory Visit: Payer: Medicare Other

## 2021-02-28 ENCOUNTER — Ambulatory Visit
Admission: RE | Admit: 2021-02-28 | Discharge: 2021-02-28 | Disposition: A | Discharge status: Home / Self Care | Payer: Medicare Other | Source: Ambulatory Visit | Attending: Nephrology | Admitting: Nephrology

## 2021-02-28 ENCOUNTER — Other Ambulatory Visit: Payer: Self-pay

## 2021-02-28 DIAGNOSIS — R809 Proteinuria, unspecified: Secondary | ICD-10-CM | POA: Diagnosis not present

## 2021-02-28 DIAGNOSIS — E785 Hyperlipidemia, unspecified: Secondary | ICD-10-CM | POA: Insufficient documentation

## 2021-02-28 IMAGING — US US RENAL
1 series · 14 of 25 positions shown · non-contrast
Comparison: CT [DATE]

CLINICAL DATA: Proteinuria

EXAM:
RENAL / URINARY TRACT ULTRASOUND COMPLETE

[Series 1: us renal · 0.30mm/px · 14 of 52 slices shown]
[im 1/52]
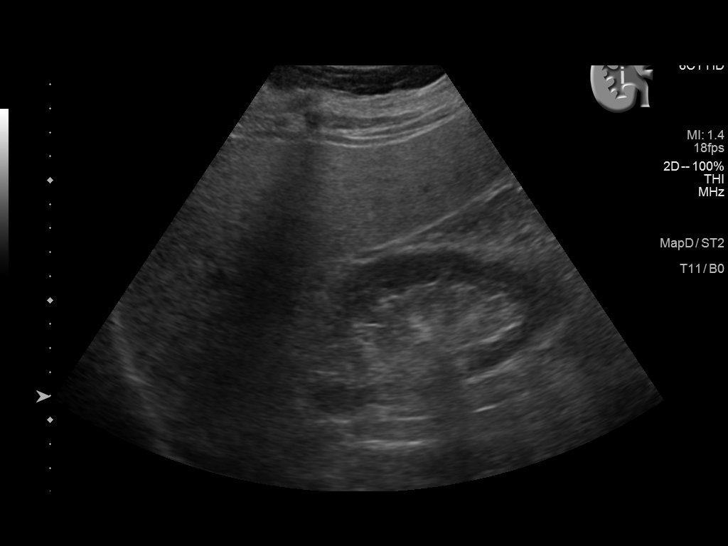
[im 5/52]
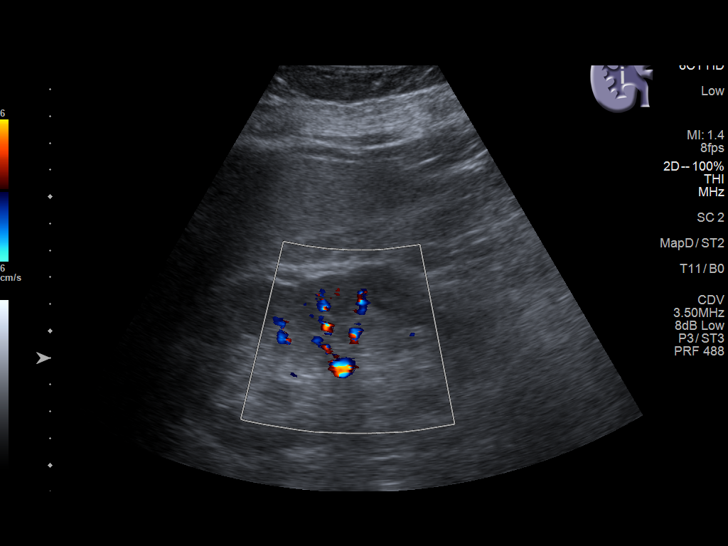
[im 9/52]
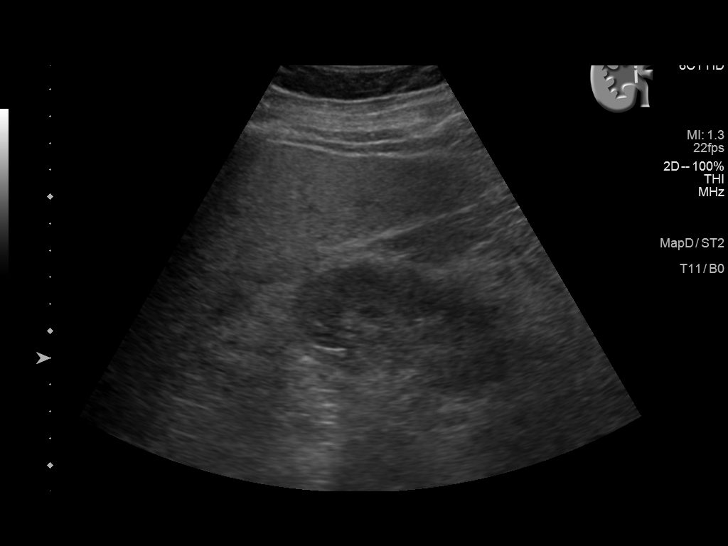
[im 13/52]
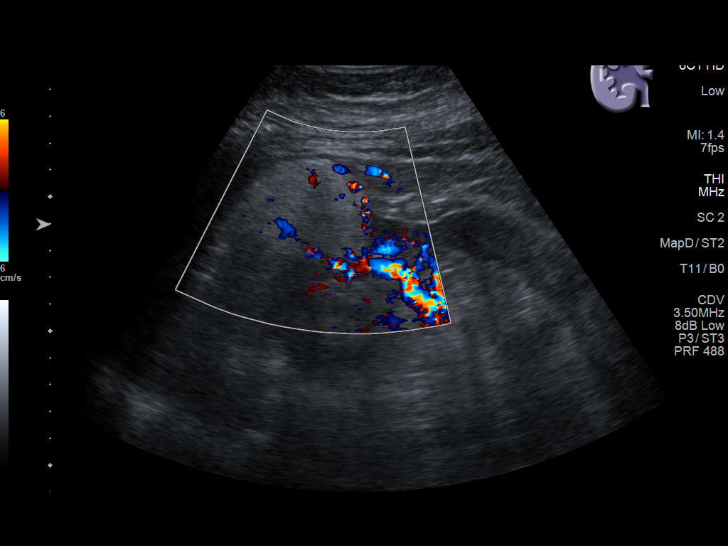
[im 18/52]
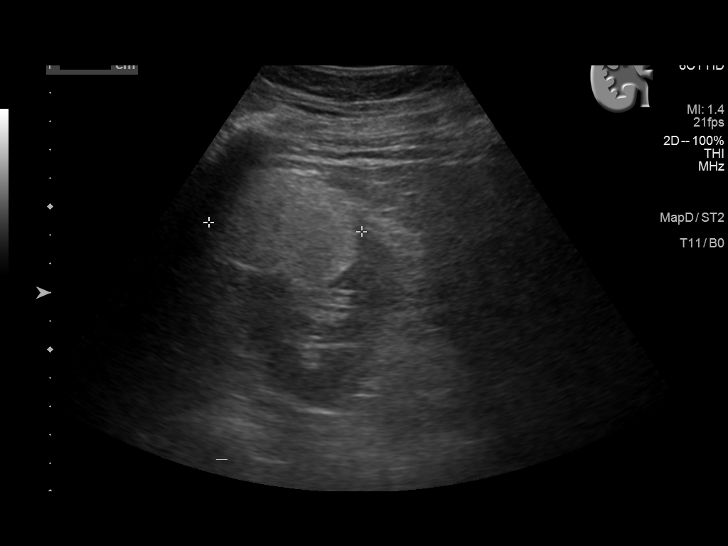
[im 20/52]
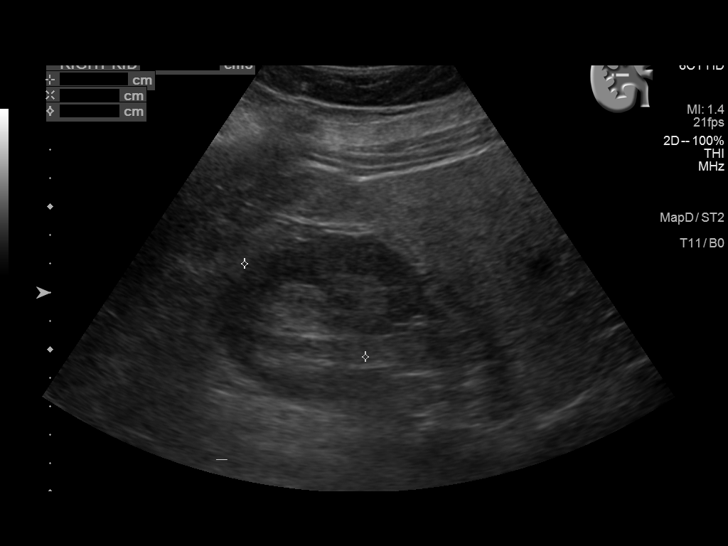
[im 24/52]
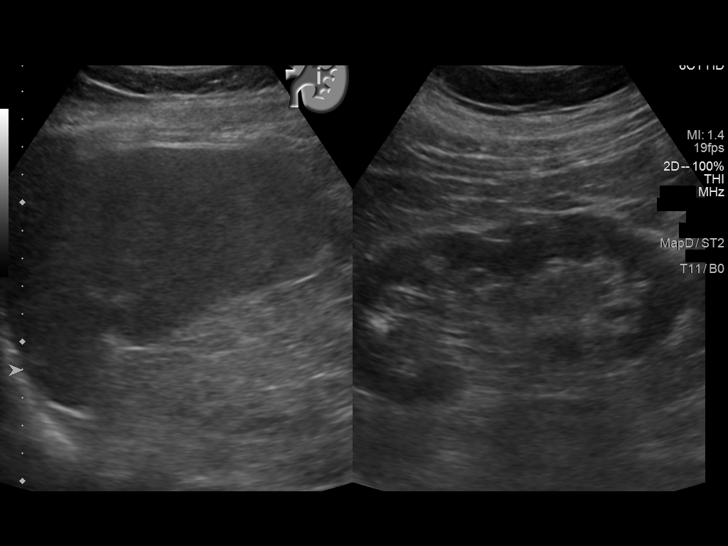
[im 28/52]
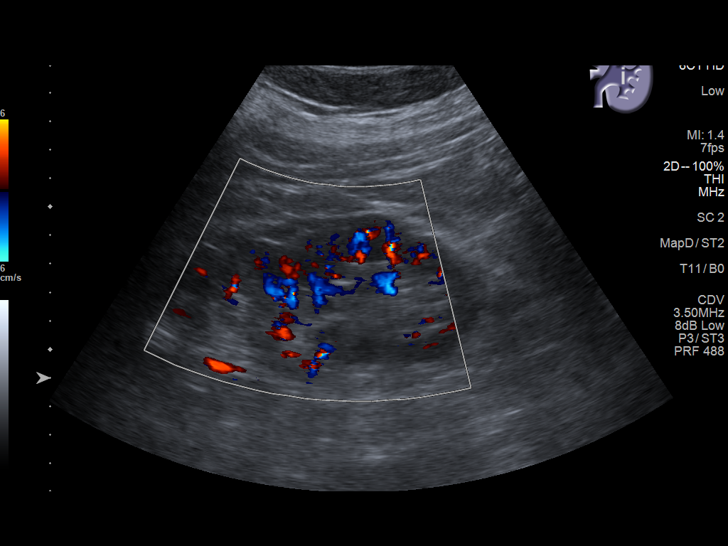
[im 32/52]
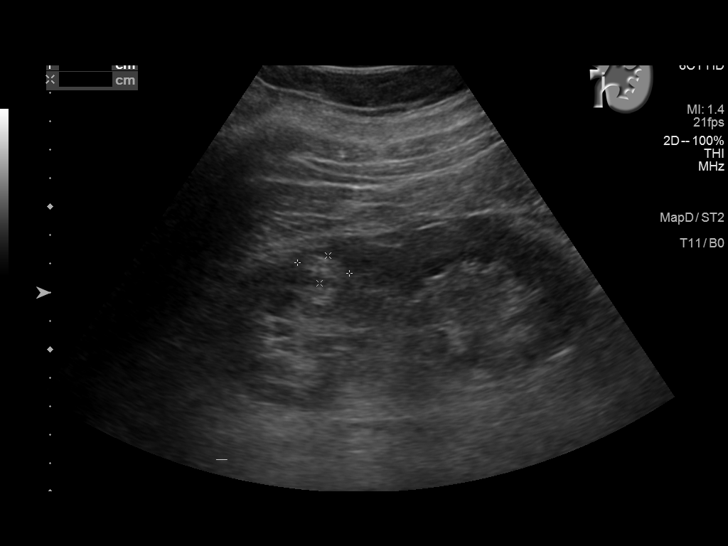
[im 35/52]
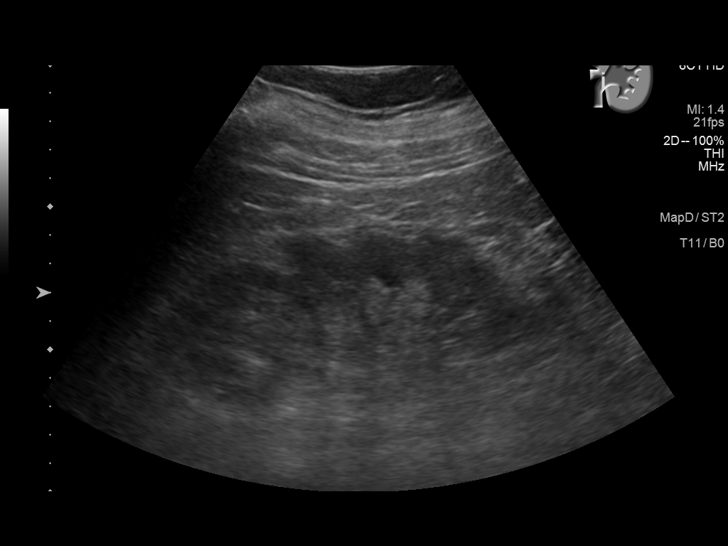
[im 39/52]
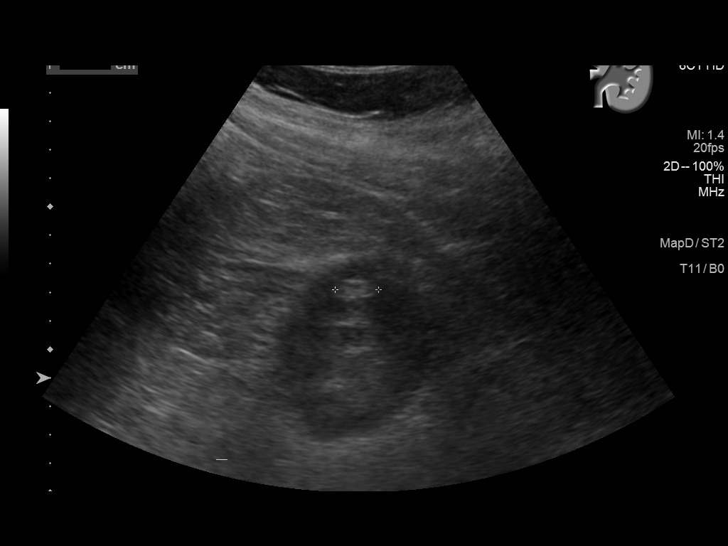
[im 43/52]
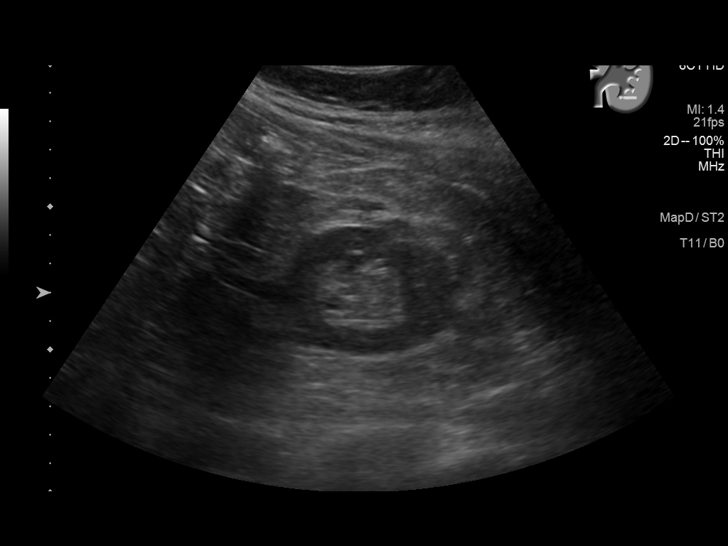
[im 47/52]
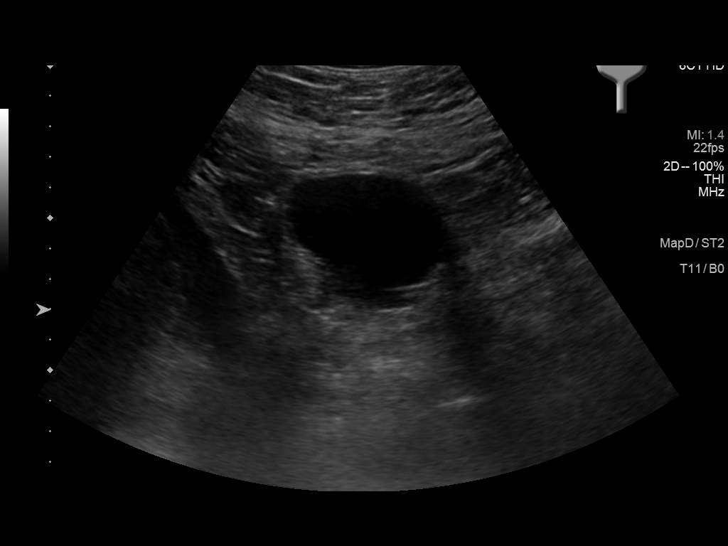
[im 52/52]
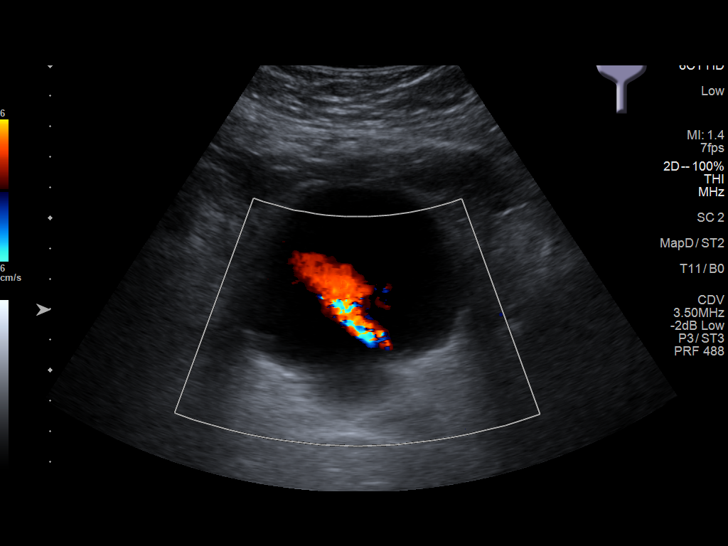

[14 of 25 positions shown; findings below may reference images not displayed]

FINDINGS: Right Kidney:

Renal measurements: 12.2 x 5.9 x 5.4 cm = volume: 201.3 mL.
Echogenicity within normal limits. No hydronephrosis. Slightly
heterogeneous echogenic solid mass exophytic to the upper pole
measuring 4.8 by 4.7 x 5.4 cm. Small cyst at the upper pole
measuring 14 x 11 x 15 mm.

Left Kidney:

Renal measurements: 12.9 x 5.4 x 5.3 cm = volume: 142.6 mL.
Echogenicity within normal limits. No hydronephrosis. Small
echogenic mass at upper pole measuring 19 x 10 x 15 mm, present on
prior CT but measures larger today.

Bladder:

Appears normal for degree of bladder distention.

Other:

None.
IMPRESSION: 1. 5.4 cm echogenic solid mass at the upper pole right kidney most
likely representing angiomyolipoma, not present on comparison from
[[facility]]. Confirmation could be obtained with renal CT or MRI.
2. Interval increase in size of 19 mm angiomyolipoma upper pole left
kidney.

## 2021-03-01 NOTE — Progress Notes (Signed)
Name: Alan Campbell   [name]: [id number]    DOB: 1951/06/09   Date:03/02/2021       Progress Note  Subjective  Chief Complaint  Follow Up  HPI  Hypothyroidism: he is on levothyroxine 150 mcg seven days a week now,  last TSH was back to normal. Denies change in bowel movements , dry skin  or dysphagia.    Hypogonadism: last PSA was normal again 02/2020 Testosterone level is still low but he states symptoms of fatigue is controlled.   He states two pumps seems to be good for him.   OA  Right knee: seen by [facility] but not severe enough for surgery,  he was taking NsAID's otc , however since proteinuria he has been taking Tylenol prn and has some old rx of hydrocodone that he takes very seldom, we will try switching to Tramadol today    Metabolic syndrome: he was on Metformin for many years, but still had insulin resistance and weight was trending up, he has been on GLP agonists since Spring 2018. He lost weight but has been off medication secondary to cost around January 2021 . He is going to resume PT so he can go back to the gym , he wants to lose weight.    HTN: taking medication daily and denies side effects, EKG was abnormal seen by Dr. Clayborn Bigness and had stress test and echo done 07/2017, mild right ventricular enlargement otherwise normal, he occasionally snores, denies decreased in exercise tolerance. He denies dizziness, chest pain or palpitation. He states lower extremity swelling is better now that he is off Norvasc. BP has improved   Echo 2019 at Dr. Etta Quill office   NORMAL LEFT VENTRICULAR SYSTOLIC FUNCTION WITH AN ESTIMATED EF = >55 %  NORMAL RIGHT VENTRICULAR SYSTOLIC FUNCTION  MILD TRICUSPID AND MITRAL VALVE INSUFFICIENCY  NO VALVULAR STENOSIS  MILD RV ENLARGEMENT  MILD BIATRIAL ENLARGEMENT     Dyslipidemia: reviewed labs . HDL improved and LDL went up from 91 to 102, he is now on Crestor and we will recheck labs today   Gout: two episodes since Fall of 2021 and  another episode that started this past week, he cannot take NsAID's and he has a very tender area on his right foot, it is red and has increase in warmth, he is using a cane today, states pain is not as intense as yesterday.   Morbid obesity: he has BMI above 35 with HTN, dyslipidemia , OA. 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 10/05/2019   Tendinitis of foot 09/15/2019   Epididymal cyst 06/24/2016   Benign essential HTN 12/30/2014   Dyslipidemia 12/30/2014   Adult hypothyroidism 123456   Dysmetabolic syndrome 123456   Arthritis, degenerative 12/30/2014   Perennial allergic rhinitis with seasonal variation 12/30/2014   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   Patella-femoral syndrome 12/03/2014   Low back pain 04/03/2010   Vitamin D deficiency 04/14/2009   OP (osteoporosis) 05/05/2007    Past Surgical History:  Procedure Laterality Date   BACK SURGERY     COLONOSCOPY WITH PROPOFOL N/A 08/14/2019  Procedure: COLONOSCOPY WITH PROPOFOL;  Surgeon: Lin Landsman, MD;  Location: [facility] ENDOSCOPY;  Service: Gastroenterology;  Laterality: N/A;  PRIORITY 4   EYE SURGERY  2016   right cataract removal   LAMINECTOMY  1985   L-5   SPINE SURGERY  1985    Family History  Problem Relation Age of Onset   Heart disease Father    Early death Father    Hypothyroidism Sister    Thyroid disease Sister    Hypothyroidism Sister    Migraines Sister    Prostate cancer Neg Hx    Bladder Cancer Neg Hx    Kidney cancer Neg Hx     Social History    Tobacco Use   Smoking status: Former    Packs/day: 0.50    Years: 5.00    Pack years: 2.50    Types: Cigarettes    Quit date: 10/03/1970    Years since quitting: 50.4   Smokeless tobacco: Never   Tobacco comments:    Quit 1972  Substance Use Topics   Alcohol use: Yes    Alcohol/week: 4.0 standard drinks    Types: 4 Standard drinks or equivalent per week     Current Outpatient Medications:    Cholecalciferol (VITAMIN D) 2000 units CAPS, Take 1 capsule (2,000 Units total) by mouth daily., Disp: 30 capsule, Rfl: 0   Cinnamon 500 MG TABS, Take 2,000 mg by mouth. , Disp: , Rfl:    CRANBERRY PO, Take 4,200 mg by mouth daily., Disp: , Rfl:    furosemide (LASIX) 20 MG tablet, Take 1 tablet (20 mg total) by mouth 2 (two) times daily., Disp: 180 tablet, Rfl: 1   HYDROcodone-acetaminophen (NORCO/VICODIN) 5-325 MG tablet, Take by mouth., Disp: , Rfl:    levothyroxine (SYNTHROID) 150 MCG tablet, TAKE ONE TABLET BY MOUTH DAILY, Disp: 90 tablet, Rfl: 3   Multiple Vitamins-Minerals (CENTRUM SILVER 50+MEN) TABS, Take 1 tablet by mouth daily., Disp: , Rfl:    OMEGA-3 FATTY ACIDS PO, Take 1 tablet by mouth daily., Disp: , Rfl:    potassium chloride SA (KLOR-CON) 20 MEQ tablet, Take 1 tablet (20 mEq total) by mouth 2 (two) times daily. Take only when you take furosemide, Disp: 180 tablet, Rfl: 1   rosuvastatin (CRESTOR) 20 MG tablet, Take 1 tablet (20 mg total) by mouth daily., Disp: 90 tablet, Rfl: 1   Testosterone 20.25 MG/ACT (1.62%) GEL, Apply 2 each topically daily. 2 pumps daily, Disp: 75 g, Rfl: 5   valsartan (DIOVAN) 320 MG tablet, Take 1 tablet (320 mg total) by mouth daily., Disp: 90 tablet, Rfl: 1   verapamil (CALAN-SR) 240 MG CR tablet, Take 1 tablet (240 mg total) by mouth at bedtime., Disp: 90 tablet, Rfl: 1   amLODipine (NORVASC) 10 MG tablet, Take 1 tablet (10 mg total) by mouth daily. (Patient not taking: Reported on 03/02/2021), Disp: 90 tablet, Rfl: 1   chlorhexidine (PERIDEX) 0.12  % solution, SMARTSIG:By Mouth (Patient not taking: Reported on 03/02/2021), Disp: , Rfl:    diclofenac (VOLTAREN) 75 MG EC tablet, Take 1 tablet (75 mg total) by mouth 2 (two) times daily. (Patient not taking: Reported on 03/02/2021), Disp: 30 tablet, Rfl: 0  No Known Allergies  I personally reviewed active problem list, medication list, allergies, family history, social history, health maintenance with the patient/caregiver today.   ROS  Constitutional: Negative for fever or weight change.  Respiratory: Negative for cough and shortness of breath.   Cardiovascular: Negative for chest pain or  palpitations.  Gastrointestinal: Negative for abdominal pain, no bowel changes.  Musculoskeletal: Positive for gait problem and  joint swelling.  Skin: Negative for rash.  Neurological: Negative for dizziness or headache.  No other specific complaints in a complete review of systems (except as listed in HPI above).   Objective  Vitals:   03/02/21 0848  BP: 136/82  Pulse: 77  Resp: 16  Temp: 98.2 F (36.8 C)  SpO2: 97%  Weight: 259 lb (117.5 kg)  Height: '5\' 10"'$  (1.778 m)    Body mass index is 37.16 kg/m.  Physical Exam  Constitutional: Patient appears well-developed and well-nourished. Obese  No distress.  HEENT: head atraumatic, normocephalic, pupils equal and reactive to light, neck supple Cardiovascular: Normal rate, regular rhythm and normal heart sounds.  No murmur heard. No BLE edema. Pulmonary/Chest: Effort normal and breath sounds normal. No respiratory distress. Abdominal: Soft.  There is no tenderness. Muscular skeletal: erythema on right lateral foot, tender to touch, not really on his ankle.  Psychiatric: Patient has a normal mood and affect. behavior is normal. Judgment and thought content normal.    PHQ2/9: Depression screen [facility] 2/9 03/02/2021 11/30/2020 08/30/2020 07/26/2020 05/31/2020  Decreased Interest 0 0 0 0 0  Down, Depressed, Hopeless 0 0 0 0 0  PHQ - 2 Score 0 0 0  0 0  Altered sleeping - - - - -  Tired, decreased energy - - - - -  Change in appetite - - - - -  Feeling bad or failure about yourself  - - - - -  Trouble concentrating - - - - -  Moving slowly or fidgety/restless - - - - -  Suicidal thoughts - - - - -  PHQ-9 Score - - - - -  Difficult doing work/chores - - - - -  Some recent data might be hidden    phq 9 is negative   Fall Risk: Fall Risk  03/02/2021 11/30/2020 08/30/2020 07/26/2020 05/31/2020  Falls in the past year? 0 0 0 0 0  Number falls in past yr: 0 0 0 0 0  Injury with Fall? 0 0 0 0 0  Risk for fall due to : No Fall Risks - - No Fall Risks -  Follow up Falls prevention discussed - - Falls prevention discussed -      Functional Status Survey: Is the patient deaf or have difficulty hearing?: No Does the patient have difficulty seeing, even when wearing glasses/contacts?: No Does the patient have difficulty concentrating, remembering, or making decisions?: No Does the patient have difficulty walking or climbing stairs?: No Does the patient have difficulty dressing or bathing?: No Does the patient have difficulty doing errands alone such as visiting a doctor's office or shopping?: No    Assessment & Plan  1. Benign essential HTN  - furosemide (LASIX) 20 MG tablet; Take 1 tablet (20 mg total) by mouth 2 (two) times daily.  Dispense: 180 tablet; Refill: 1 - potassium chloride SA (KLOR-CON) 20 MEQ tablet; Take 1 tablet (20 mEq total) by mouth 2 (two) times daily. Take only when you take furosemide  Dispense: 180 tablet; Refill: 1 - valsartan (DIOVAN) 320 MG tablet; Take 1 tablet (320 mg total) by mouth daily.  Dispense: 90 tablet; Refill: 1 - verapamil (CALAN-SR) 240 MG CR tablet; Take 1 tablet (240 mg total) by mouth at bedtime.  Dispense: 90 tablet; Refill: 1 - COMPLETE METABOLIC PANEL WITH GFR  2. Long-term use of high-risk medication  - potassium  chloride SA (KLOR-CON) 20 MEQ tablet; Take 1 tablet (20 mEq total) by  mouth 2 (two) times daily. Take only when you take furosemide  Dispense: 180 tablet; Refill: 1 - PSA  3. Dyslipidemia  - rosuvastatin (CRESTOR) 20 MG tablet; Take 1 tablet (20 mg total) by mouth daily.  Dispense: 90 tablet; Refill: 1 - Lipid panel  4. Hypothyroidism, adult   5. Morbid obesity ([place])  Discussed with the patient the risk posed by an increased BMI. Discussed importance of portion control, calorie counting and at least 150 minutes of physical activity weekly. Avoid sweet beverages and drink more water. Eat at least 6 servings of fruit and vegetables daily    6. Albuminuria  Under the care of Nephrologist   7. Vitamin D deficiency   8. Bilateral lower extremity edema   9. Need for shingles vaccine  - Zoster Vaccine Adjuvanted [facility]) injection; Inject 0.5 mLs into the muscle once for 1 dose.  Dispense: 0.5 mL; Refill: 0  10. Needs flu shot   11. Acute gout of right ankle, unspecified cause  - predniSONE (DELTASONE) 10 MG tablet; Take 1 tablet (10 mg total) by mouth 2 (two) times daily with a meal. Prn gout  Dispense: 30 tablet; Refill: 0 - traMADol (ULTRAM) 50 MG tablet; Take 1 tablet (50 mg total) by mouth every 8 (eight) hours as needed for up to 5 days.  Dispense: 30 tablet; Refill: 0  Explained he needs to monitor spot on foot , may be an infection and not gout, his wife is a NP and I asked him to show it to her and to monitor size. Call back to morrow if it has gotten worse with prednisone and we will send antibiotics   12. Primary osteoarthritis of both knees  - traMADol (ULTRAM) 50 MG tablet; Take 1 tablet (50 mg total) by mouth every 8 (eight) hours as needed for up to 5 days.  Dispense: 30 tablet; Refill: 0  13. Prostate cancer screening  - PSA  14. Hypogonadism in male  - PSA  15. Genitourinary symptoms in male patient  - PSA  16. Disorder of prostate, unspecified   - PSA

## 2021-03-02 ENCOUNTER — Other Ambulatory Visit: Payer: Self-pay

## 2021-03-02 ENCOUNTER — Encounter: Payer: Self-pay | Admitting: Family Medicine

## 2021-03-02 ENCOUNTER — Ambulatory Visit (INDEPENDENT_AMBULATORY_CARE_PROVIDER_SITE_OTHER): Payer: Medicare Other | Admitting: Family Medicine

## 2021-03-02 DIAGNOSIS — M170 Bilateral primary osteoarthritis of knee: Secondary | ICD-10-CM

## 2021-03-02 DIAGNOSIS — Z125 Encounter for screening for malignant neoplasm of prostate: Secondary | ICD-10-CM

## 2021-03-02 DIAGNOSIS — R399 Unspecified symptoms and signs involving the genitourinary system: Secondary | ICD-10-CM

## 2021-03-02 DIAGNOSIS — Z23 Encounter for immunization: Secondary | ICD-10-CM

## 2021-03-02 DIAGNOSIS — E559 Vitamin D deficiency, unspecified: Secondary | ICD-10-CM

## 2021-03-02 DIAGNOSIS — N429 Disorder of prostate, unspecified: Secondary | ICD-10-CM

## 2021-03-02 DIAGNOSIS — R809 Proteinuria, unspecified: Secondary | ICD-10-CM

## 2021-03-02 DIAGNOSIS — E785 Hyperlipidemia, unspecified: Secondary | ICD-10-CM | POA: Diagnosis not present

## 2021-03-02 DIAGNOSIS — I10 Essential (primary) hypertension: Secondary | ICD-10-CM | POA: Diagnosis not present

## 2021-03-02 DIAGNOSIS — R600 Localized edema: Secondary | ICD-10-CM

## 2021-03-02 DIAGNOSIS — M109 Gout, unspecified: Secondary | ICD-10-CM

## 2021-03-02 DIAGNOSIS — E039 Hypothyroidism, unspecified: Secondary | ICD-10-CM

## 2021-03-02 DIAGNOSIS — Z79899 Other long term (current) drug therapy: Secondary | ICD-10-CM

## 2021-03-02 DIAGNOSIS — E291 Testicular hypofunction: Secondary | ICD-10-CM

## 2021-03-02 MED ORDER — TRAMADOL HCL 50 MG PO TABS
50.0000 mg | ORAL_TABLET | Freq: Three times a day (TID) | ORAL | 0 refills | Status: AC | PRN
Start: 2021-03-02 — End: 2021-03-07

## 2021-03-02 MED ORDER — VALSARTAN 320 MG PO TABS: 320.0000 mg | ORAL_TABLET | Freq: Every day | ORAL | 1 refills | Status: DC

## 2021-03-02 MED ORDER — PREDNISONE 10 MG PO TABS: 10.0000 mg | ORAL_TABLET | Freq: Two times a day (BID) | ORAL | 0 refills | Status: DC

## 2021-03-02 MED ORDER — ROSUVASTATIN CALCIUM 20 MG PO TABS: 20.0000 mg | ORAL_TABLET | Freq: Every day | ORAL | 1 refills | Status: DC

## 2021-03-02 MED ORDER — VERAPAMIL HCL ER 240 MG PO TBCR: 240.0000 mg | EXTENDED_RELEASE_TABLET | Freq: Every day | ORAL | 1 refills | Status: DC

## 2021-03-02 MED ORDER — POTASSIUM CHLORIDE CRYS ER 20 MEQ PO TBCR: 20.0000 meq | EXTENDED_RELEASE_TABLET | Freq: Two times a day (BID) | ORAL | 1 refills | Status: DC

## 2021-03-02 MED ORDER — SHINGRIX 50 MCG/0.5ML IM SUSR: 0.5000 mL | Freq: Once | INTRAMUSCULAR | 0 refills | Status: AC

## 2021-03-02 MED ORDER — FUROSEMIDE 20 MG PO TABS: 20.0000 mg | ORAL_TABLET | Freq: Two times a day (BID) | ORAL | 1 refills | Status: DC

## 2021-03-03 LAB — COMPLETE METABOLIC PANEL WITH GFR
AG Ratio: 2.1 (calc) (ref 1.0–2.5)
ALT: 26 U/L (ref 9–46)
AST: 15 U/L (ref 10–35)
Albumin: 4.2 g/dL (ref 3.6–5.1)
Alkaline phosphatase (APISO): 90 U/L (ref 35–144)
BUN: 17 mg/dL (ref 7–25)
CO2: 28 mmol/L (ref 20–32)
Calcium: 9.3 mg/dL (ref 8.6–10.3)
Chloride: 105 mmol/L (ref 98–110)
Creat: 1.14 mg/dL (ref 0.70–1.28)
Globulin: 2 g/dL (calc) (ref 1.9–3.7)
Glucose, Bld: 106 mg/dL — ABNORMAL HIGH (ref 65–99)
Potassium: 3.7 mmol/L (ref 3.5–5.3)
Sodium: 140 mmol/L (ref 135–146)
Total Bilirubin: 0.7 mg/dL (ref 0.2–1.2)
Total Protein: 6.2 g/dL (ref 6.1–8.1)
eGFR: 69 mL/min/{1.73_m2} (ref >=60)

## 2021-03-03 LAB — TEST AUTHORIZATION

## 2021-03-03 LAB — LIPID PANEL
Cholesterol: 152 mg/dL (ref <200)
HDL: 41 mg/dL (ref >=40)
LDL Cholesterol (Calc): 88 mg/dL (calc)
Non-HDL Cholesterol (Calc): 111 mg/dL (calc) (ref <130)
Total CHOL/HDL Ratio: 3.7 (calc) (ref <5.0)
Triglycerides: 129 mg/dL (ref <150)

## 2021-03-03 LAB — PSA: PSA: 0.52 ng/mL (ref <=4.00)

## 2021-03-20 DIAGNOSIS — M25561 Pain in right knee: Secondary | ICD-10-CM | POA: Insufficient documentation

## 2021-03-22 ENCOUNTER — Encounter: Payer: Self-pay | Admitting: Family Medicine

## 2021-03-22 ENCOUNTER — Other Ambulatory Visit: Payer: Self-pay | Admitting: Family Medicine

## 2021-03-22 DIAGNOSIS — N281 Cyst of kidney, acquired: Secondary | ICD-10-CM | POA: Insufficient documentation

## 2021-03-22 DIAGNOSIS — E668 Other obesity: Secondary | ICD-10-CM | POA: Insufficient documentation

## 2021-03-22 DIAGNOSIS — D1771 Benign lipomatous neoplasm of kidney: Secondary | ICD-10-CM | POA: Insufficient documentation

## 2021-03-22 DIAGNOSIS — I890 Lymphedema, not elsewhere classified: Secondary | ICD-10-CM | POA: Insufficient documentation

## 2021-03-22 DIAGNOSIS — M170 Bilateral primary osteoarthritis of knee: Secondary | ICD-10-CM

## 2021-03-22 DIAGNOSIS — R808 Other proteinuria: Secondary | ICD-10-CM | POA: Insufficient documentation

## 2021-03-22 HISTORY — DX: Benign lipomatous neoplasm of kidney: D17.71

## 2021-04-03 ENCOUNTER — Ambulatory Visit (INDEPENDENT_AMBULATORY_CARE_PROVIDER_SITE_OTHER): Payer: Medicare Other

## 2021-04-03 ENCOUNTER — Other Ambulatory Visit: Payer: Self-pay

## 2021-04-03 DIAGNOSIS — Z23 Encounter for immunization: Secondary | ICD-10-CM | POA: Diagnosis not present

## 2021-04-07 ENCOUNTER — Other Ambulatory Visit: Payer: Self-pay | Admitting: Nephrology

## 2021-04-07 DIAGNOSIS — I10 Essential (primary) hypertension: Secondary | ICD-10-CM

## 2021-04-07 DIAGNOSIS — D631 Anemia in chronic kidney disease: Secondary | ICD-10-CM

## 2021-04-07 DIAGNOSIS — N281 Cyst of kidney, acquired: Secondary | ICD-10-CM

## 2021-04-07 DIAGNOSIS — E8881 Metabolic syndrome: Secondary | ICD-10-CM

## 2021-04-07 DIAGNOSIS — D1771 Benign lipomatous neoplasm of kidney: Secondary | ICD-10-CM

## 2021-04-19 ENCOUNTER — Ambulatory Visit
Admission: RE | Admit: 2021-04-19 | Discharge: 2021-04-19 | Disposition: A | Discharge status: Home / Self Care | Payer: Medicare Other | Source: Ambulatory Visit | Attending: Nephrology | Admitting: Nephrology

## 2021-04-19 ENCOUNTER — Other Ambulatory Visit: Payer: Self-pay

## 2021-04-19 DIAGNOSIS — D1771 Benign lipomatous neoplasm of kidney: Secondary | ICD-10-CM | POA: Diagnosis present

## 2021-04-19 DIAGNOSIS — E8881 Metabolic syndrome: Secondary | ICD-10-CM | POA: Insufficient documentation

## 2021-04-19 DIAGNOSIS — N281 Cyst of kidney, acquired: Secondary | ICD-10-CM | POA: Diagnosis present

## 2021-04-19 DIAGNOSIS — I10 Essential (primary) hypertension: Secondary | ICD-10-CM | POA: Diagnosis not present

## 2021-04-19 DIAGNOSIS — D631 Anemia in chronic kidney disease: Secondary | ICD-10-CM | POA: Insufficient documentation

## 2021-04-19 DIAGNOSIS — N189 Chronic kidney disease, unspecified: Secondary | ICD-10-CM

## 2021-04-19 IMAGING — MR MR ABDOMEN WO/W CM
18 series · 48 of 48 positions shown · IV contrast (10ml Gadavist)
Comparison: [DATE] renal ultrasound. [DATE] CT stone study.

CLINICAL DATA: Ultrasound demonstrating a solid mass in the upper
pole right kidney.

EXAM:
MRI ABDOMEN WITHOUT AND WITH CONTRAST
TECHNIQUE: Multiplanar multisequence MR imaging of the abdomen was performed
both before and after the administration of intravenous contrast.
CONTRAST:  10mL GADAVIST GADOBUTROL 1 MMOL/ML IV SOLN 10 cc Gadavist

[Series 3: T2 · coronal · 6.0mm · 1.19mm/px · 2 of 40 slices shown (1 of 2)]
[im 1/40]
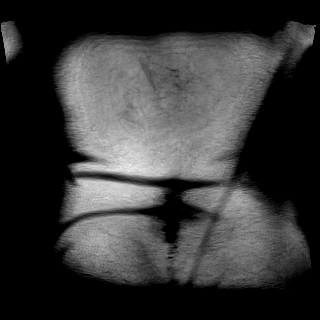
[im 40/40]
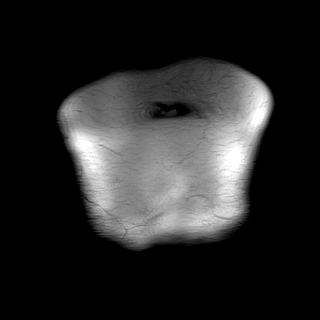

[Series 4: T2 · axial · 6.0mm · 1.19mm/px · z∈[-76,+190]mm · 2 of 38 slices shown (2 of 2)]
[im 1/38]
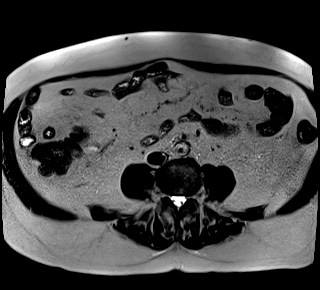
[im 38/38]
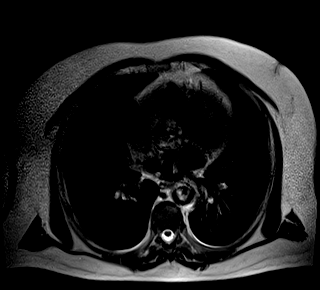

[Series 6: T2 fat-sat · axial · 6.0mm · 1.19mm/px · z∈[-76,+190]mm · 2 of 38 slices shown]
[im 1/38]
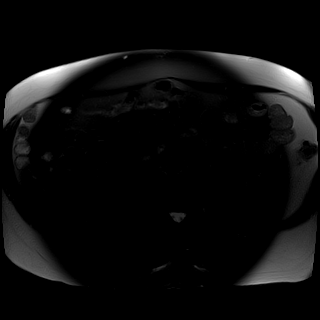
[im 38/38]
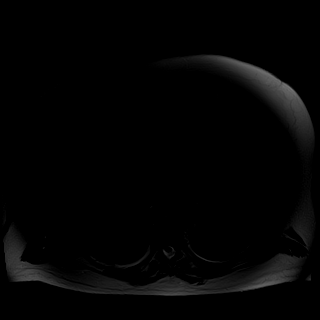

[Series 7: ax dwi_tracew · axial · 6.0mm · 1.42mm/px · z∈[-76,+190]mm · 4 of 114 slices shown]
[im 1/114]
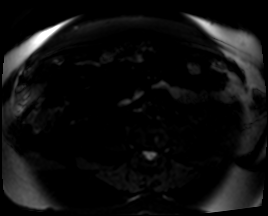
[im 38/114]
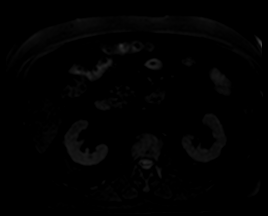
[im 76/114]
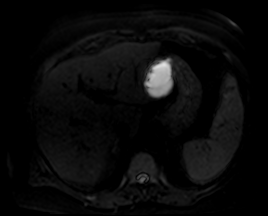
[im 114/114]
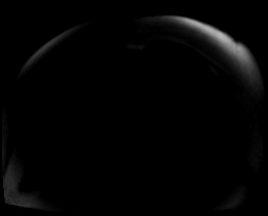

[Series 8: ax dwi_adc · axial · 6.0mm · 1.42mm/px · 1 of 38 slices shown]
[im 1/38]
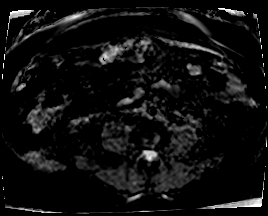

[Series 11: bSSFP · axial · 6.0mm · 0.74mm/px · 1 of 38 slices shown]
[im 1/38]
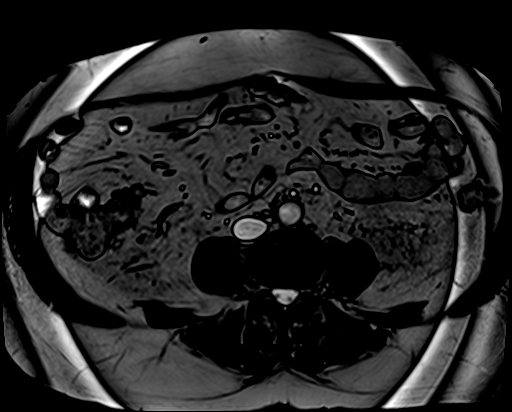

[Series 12: T1 dynamic fat-sat · axial · non-contrast · 3.0mm · 1.19mm/px · z∈[-73,+188]mm · 3 of 88 slices shown (1 of 5)]
[im 1/88]
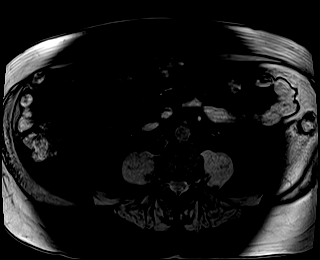
[im 44/88]
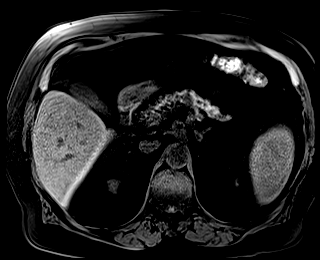
[im 88/88]
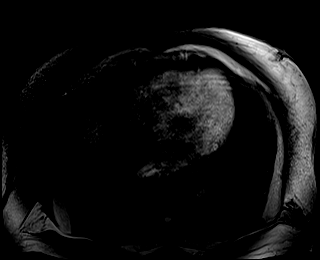

[Series 13: in & out · axial · 3.0mm · 1.19mm/px · z∈[-95,+190]mm · 3 of 96 slices shown (1 of 2)]
[im 1/96]
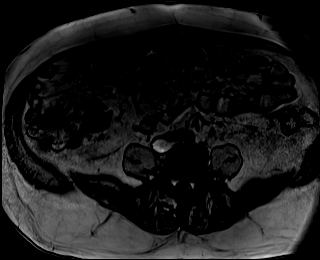
[im 48/96]
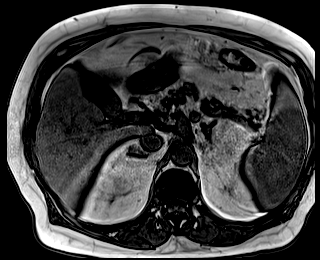
[im 96/96]
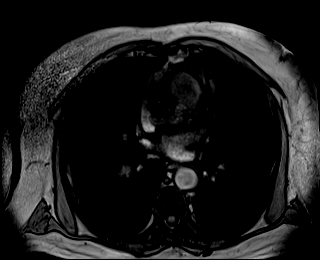

[Series 14: in & out · axial · 3.0mm · 1.19mm/px · z∈[-95,+190]mm · 3 of 96 slices shown (2 of 2)]
[im 1/96]
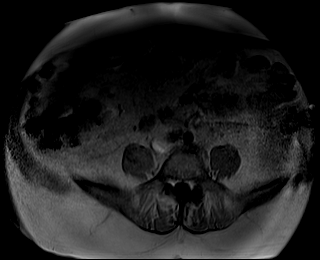
[im 48/96]
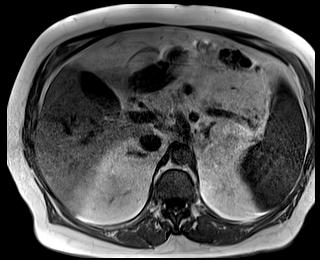
[im 96/96]
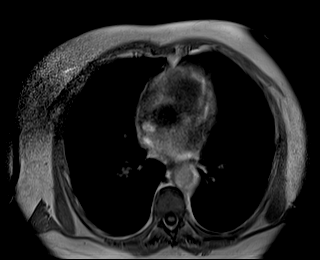

[Series 15: T1 dynamic fat-sat post-contrast · axial · 3.0mm · 1.19mm/px · z∈[-73,+188]mm · 3 of 88 slices shown (1 of 4)]
[im 1/88]
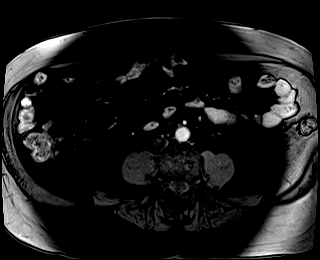
[im 44/88]
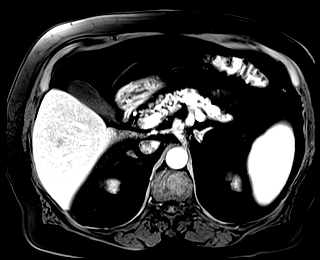
[im 88/88]
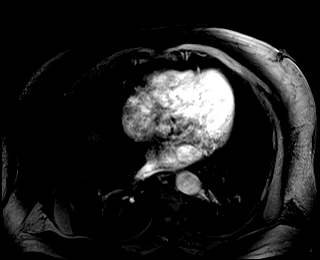

[Series 16: T1 dynamic fat-sat · axial · 3.0mm · 1.19mm/px · z∈[-73,+188]mm · 3 of 88 slices shown (2 of 5)]
[im 1/88]
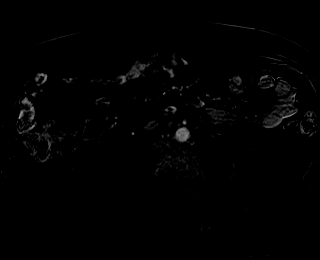
[im 44/88]
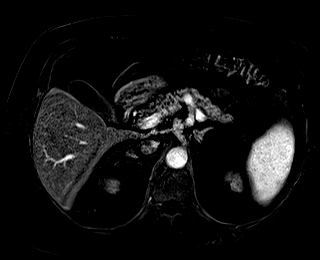
[im 88/88]
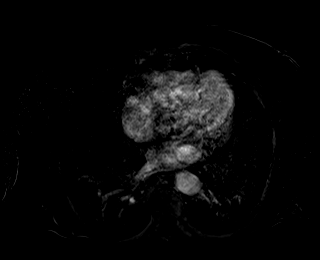

[Series 17: T1 dynamic fat-sat post-contrast · axial · 3.0mm · 1.19mm/px · z∈[-73,+188]mm · 3 of 88 slices shown (2 of 4)]
[im 1/88]
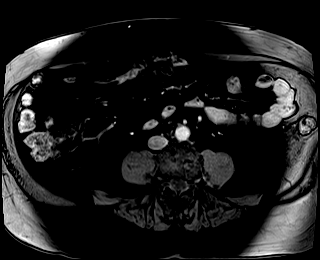
[im 44/88]
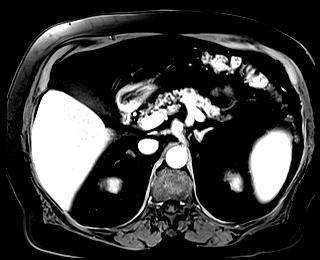
[im 88/88]
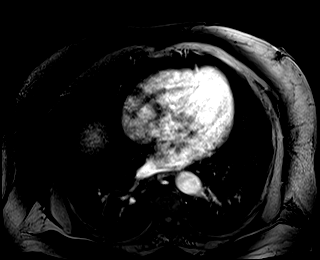

[Series 18: T1 dynamic fat-sat · axial · 3.0mm · 1.19mm/px · z∈[-73,+188]mm · 3 of 88 slices shown (3 of 5)]
[im 1/88]
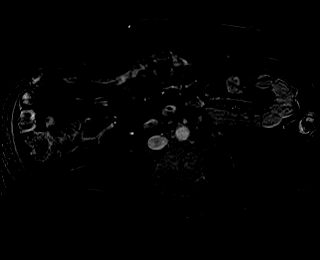
[im 44/88]
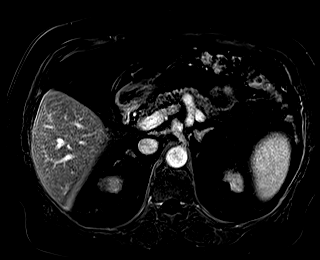
[im 88/88]
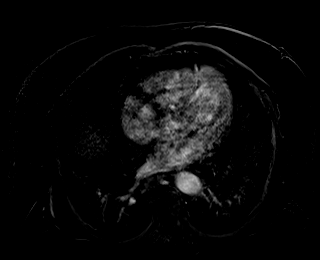

[Series 19: T1 dynamic fat-sat post-contrast · axial · 3.0mm · 1.19mm/px · z∈[-73,+188]mm · 3 of 88 slices shown (3 of 4)]
[im 1/88]
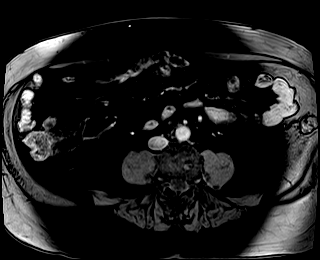
[im 44/88]
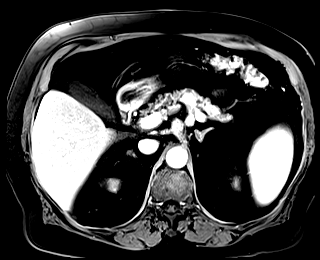
[im 88/88]
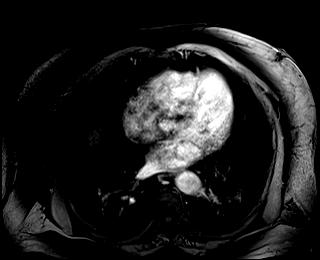

[Series 20: T1 dynamic fat-sat · axial · 3.0mm · 1.19mm/px · z∈[-73,+188]mm · 3 of 88 slices shown (4 of 5)]
[im 1/88]
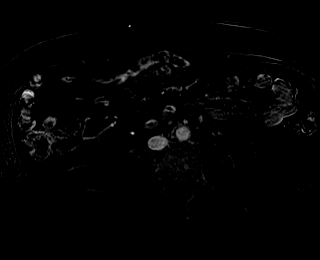
[im 44/88]
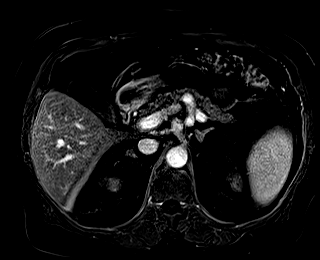
[im 88/88]
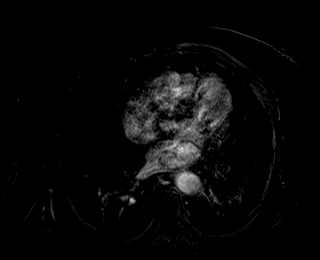

[Series 21: T1 dynamic post-contrast · coronal · 3.0mm · 1.31mm/px · 3 of 72 slices shown]
[im 1/72]
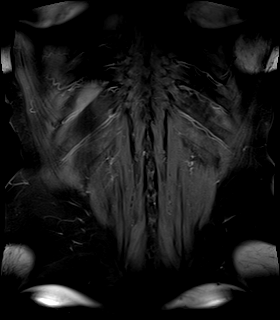
[im 36/72]
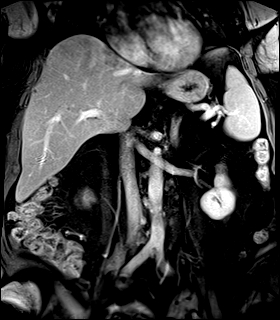
[im 72/72]
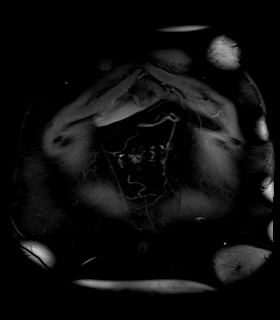

[Series 22: T1 dynamic fat-sat post-contrast · axial · 3.0mm · 1.19mm/px · z∈[-73,+188]mm · 3 of 88 slices shown (4 of 4)]
[im 1/88]
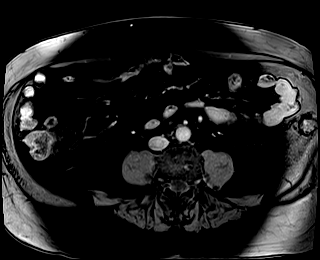
[im 44/88]
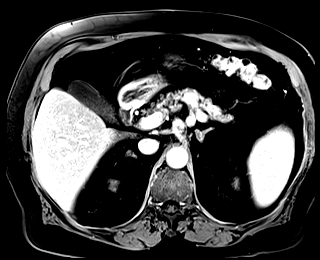
[im 88/88]
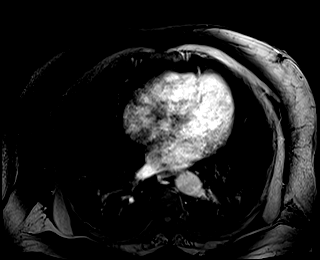

[Series 23: T1 dynamic fat-sat · axial · 3.0mm · 1.19mm/px · z∈[-73,+188]mm · 3 of 88 slices shown (5 of 5)]
[im 1/88]
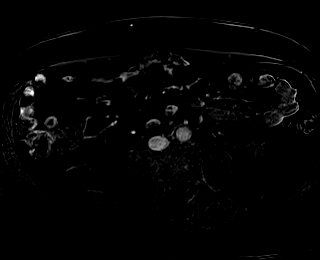
[im 44/88]
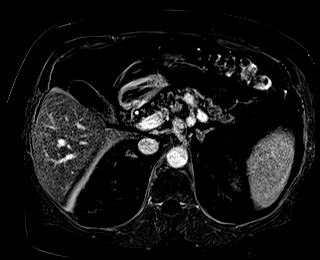
[im 88/88]
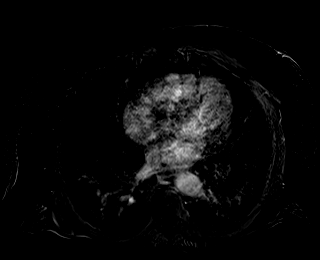

[48 of 48 positions shown; findings below may reference images not displayed]

FINDINGS: Lower chest: Mild cardiomegaly, without pericardial or pleural
effusion.

Hepatobiliary: Hepatomegaly at 19.9 cm. Scattered simple hepatic
cysts. A dominant lateral segment left liver lobe 5.1 cm lesion on
[DATE] is consistent with a complex cyst.

Normal gallbladder, without biliary ductal dilatation.

Pancreas:  Normal, without mass or ductal dilatation.

Spleen:  Normal in size, without focal abnormality.

Adrenals/Urinary Tract: Mild left adrenal thickening. Normal right
adrenal gland.

Bilateral small renal cysts.

An interpolar left renal 7 mm angiomyolipoma including on [DATE] is
similar in [JZ].

Corresponding to the ultrasound abnormality, within the
posterolateral upper pole right kidney, is a 3.3 x 3.6 cm lesion on
[DATE] which has macroscopic fat within, including T2 hypointensity on
series 6 and hypointensity at the mass/renal parenchymal interface
on out of phase image 61/13.

Lower pole right renal 2.0 cm exophytic lesion is mildly T2
hyperintense on 34/6. Does not demonstrate macroscopic fat by MRI
criteria. Postcontrast enhancement, including on 82/20.

No hydronephrosis.

Stomach/Bowel: Normal stomach and abdominal bowel loops.

Vascular/Lymphatic: Aortic atherosclerosis. Patent renal veins. No
abdominal adenopathy.

Other:  No ascites.

Musculoskeletal: No acute osseous abnormality.
IMPRESSION: 1. The ultrasound abnormality corresponds to an upper pole right
renal angiomyolipoma.
2. Exophytic lower pole right renal enhancing mass, highly
suspicious for renal cell carcinoma.
3. No renal vein involvement or evidence of abdominal metastatic
disease.
4.  Aortic Atherosclerosis ([JZ]-[JZ]).

These results will be called to the ordering clinician or
representative by the Radiologist Assistant, and communication
documented in the PACS or [REDACTED].

## 2021-04-19 MED ORDER — GADOBUTROL 1 MMOL/ML IV SOLN
10.0000 mL | Freq: Once | INTRAVENOUS | Status: AC | PRN
  Administered 2021-04-19: 10 mL via INTRAVENOUS

## 2021-04-26 DIAGNOSIS — N2889 Other specified disorders of kidney and ureter: Secondary | ICD-10-CM | POA: Insufficient documentation

## 2021-04-26 NOTE — Progress Notes (Signed)
04/27/21 9:01 AM   Alan Campbell 1950/06/28 [id number]  Referring provider:  [name], MD [address] [place],  [place] [address] Chief Complaint  Patient presents with   renal mass     HPI: Alan Campbell is a 70 y.o.male with a personal history of retractile testis, hypogonadism, and epididymal cyst, who presents today for further evaluation of renal mass and discussion of treatment.   Incidental renal mass on renal ultrasound as part of work-up for proteinuria ordered by nephrologist.  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  04/19/2021 MRI abdomen with and without contrast revealed a 3.3 x 3.6 cm lesion on 22/6 which has macroscopic fat within consistent with angiomyolipoma , including T2 hypointensity on series 6 and hypointensity at the mass/renal parenchymal interface on out of phase image 61/13. 2.0 cm exophytic lesion is mildly T2 hyperintense on 34/6. Does not demonstrate macroscopic fat by MRI criteria. 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 These demonstrated on previous image   His baseline creatinine was 1.1.   He is accompanied today by his wife who is a Designer, jewellery.  He reports the he has been seeing a nephrologist for the protein in his urine.   He reports that his mother had vulvar cancer. He has a history of smoking in his teens but he quit when he was 89.   PMH: Past Medical History:  Diagnosis Date   Allergy    Anemia, iron deficiency    Cataract    Dysplastic nevus 04/24/2010   Left low back. Moderate to severe atypia, edge involved.   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 If you have any questions, ask your nurse or doctor.          STOP taking these medications    diclofenac 75 MG EC tablet Commonly known as: VOLTAREN Stopped by: Alan Espy, MD   HYDROcodone-acetaminophen 5-325 MG tablet Commonly known as: NORCO/VICODIN Stopped by: Alan Espy, MD   predniSONE 10 MG tablet Commonly known as: DELTASONE Stopped by: Alan Espy, MD       TAKE these medications    Centrum Silver 50+Men Tabs Take 1 tablet by mouth daily.   Cinnamon 500 MG Tabs Take 2,000 mg by mouth.   CRANBERRY PO Take 4,200 mg by mouth daily.   furosemide 20 MG tablet Commonly known  as: LASIX Take 1 tablet (20 mg total) by mouth 2 (two) times daily.   levothyroxine 150 MCG tablet Commonly known as: SYNTHROID TAKE ONE TABLET BY MOUTH DAILY   OMEGA-3 FATTY ACIDS PO Take 1 tablet by mouth daily.   potassium chloride SA 20 MEQ tablet Commonly known as: KLOR-CON Take 1 tablet (20 mEq total) by mouth 2 (two) times daily. Take only when you take furosemide   rosuvastatin 20 MG tablet Commonly known as: CRESTOR Take 1 tablet (20 mg total) by mouth daily.   Testosterone 20.25 MG/ACT (1.62%) Gel Apply 2 each topically daily. 2 pumps daily   valsartan 320 MG tablet Commonly known as:  DIOVAN Take 1 tablet (320 mg total) by mouth daily.   verapamil 240 MG CR tablet Commonly known as: CALAN-SR Take 1 tablet (240 mg total) by mouth at bedtime.   Vitamin D 50 MCG (2000 UT) Caps Take 1 capsule (2,000 Units total) by mouth daily.        Allergies: No Known Allergies  Family History: Family History  Problem Relation Age of Onset   Heart disease Father    Early death Father    Hypothyroidism Sister    Thyroid disease Sister    Hypothyroidism Sister    Migraines Sister    Prostate cancer Neg Hx    Bladder Cancer Neg Hx    Kidney cancer Neg Hx     Social History:  reports that he quit smoking about 50 years ago. His smoking use included cigarettes. He has a 2.50 pack-year smoking history. He has never used smokeless tobacco. He reports current alcohol use of about 4.0 standard drinks per week. He reports that he does not use drugs.   Physical Exam: BP (!) 185/100   Pulse 68   Ht 5\' 11"  (1.803 m)   Wt 243 lb (110.2 kg)   BMI 33.89 kg/m   Constitutional:  Alert and oriented, No acute distress. HEENT: [place] AT, moist mucus membranes.  Trachea midline, no masses. Cardiovascular: No clubbing, cyanosis, or edema. Respiratory: Normal respiratory effort, no increased work of breathing. Skin: No rashes, bruises or suspicious lesions. Neurologic: Grossly intact, no focal deficits, moving all 4 extremities. Psychiatric: Normal mood and affect.  Laboratory Data:  Lab Results  Component Value Date   CREATININE 1.14 03/02/2021    Lab Results  Component Value Date   PSA 0.52 03/02/2021   PSA 1.0 02/25/2020   PSA 1.0 11/05/2018    Lab Results  Component Value Date   TESTOSTERONE 183 (L) 02/25/2020    Lab Results  Component Value Date   HGBA1C 4.9 02/25/2020    Pertinent Imaging:   CLINICAL DATA:  Ultrasound demonstrating a solid mass in the upper pole right kidney.   EXAM: MRI ABDOMEN WITHOUT AND WITH CONTRAST   TECHNIQUE: Multiplanar  multisequence MR imaging of the abdomen was performed both before and after the administration of intravenous contrast.   CONTRAST:  78mL GADAVIST GADOBUTROL 1 MMOL/ML IV SOLN 10 cc Gadavist   COMPARISON:  02/28/2021 renal ultrasound. 08/08/2007 CT stone study.   FINDINGS: Lower chest: Mild cardiomegaly, without pericardial or pleural effusion.   Hepatobiliary: Hepatomegaly at 19.9 cm. Scattered simple hepatic cysts. A dominant lateral segment left liver lobe 5.1 cm lesion on 12/06 is consistent with a complex cyst.   Normal gallbladder, without biliary ductal dilatation.   Pancreas:  Normal, without mass or ductal dilatation.   Spleen:  Normal in size, without focal abnormality.   Adrenals/Urinary Tract:  Mild left adrenal thickening. Normal right adrenal gland.   Bilateral small renal cysts.   An interpolar left renal 7 mm angiomyolipoma including on 25/6 is similar in 2009.   Corresponding to the ultrasound abnormality, within the posterolateral upper pole right kidney, is a 3.3 x 3.6 cm lesion on 22/6 which has macroscopic fat within, including T2 hypointensity on series 6 and hypointensity at the mass/renal parenchymal interface on out of phase image 61/13.   Lower pole right renal 2.0 cm exophytic lesion is mildly T2 hyperintense on 34/6. Does not demonstrate macroscopic fat by MRI criteria. Postcontrast enhancement, including on 82/20.   No hydronephrosis.   Stomach/Bowel: Normal stomach and abdominal bowel loops.   Vascular/Lymphatic: Aortic atherosclerosis. Patent renal veins. No abdominal adenopathy.   Other:  No ascites.   Musculoskeletal: No acute osseous abnormality.   IMPRESSION: 1. 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 2. Exophytic lower pole right renal enhancing mass, highly suspicious for renal cell carcinoma. 3. No renal vein involvement or evidence of abdominal metastatic disease. 4.  Aortic  Atherosclerosis (ICD10-I70.0).   These results will be called to the ordering clinician or representative by the Radiologist Assistant, and communication documented in the PACS or [employer].     Electronically Signed   By: Abigail Miyamoto M.D.   On: 04/19/2021 12:31  Abdominal MRI was personally reviewed today, agree with radiologic interpretation.  Assessment & Plan:   Renal mass concerning for RCC, right lower pole - A solid renal mass raises the suspicion of primary renal malignancy.  We discussed this in detail and in regards to the spectrum of renal masses which includes cysts (pure cysts are considered benign), solid masses and everything in between. The risk of metastasis increases as the size of solid renal mass increases. In general, it is believed that the risk of metastasis for renal masses less than 3-4 cm is small (up to approximately 5%) based mainly on large retrospective studies. In some cases and especially in patients of older age and multiple comorbidities a surveillance approach may be appropriate. The treatment of solid renal masses includes: surveillance, cryoablation (percutaneous and laparoscopic) in addition to partial and complete nephrectomy (each with option of laparoscopic, robotic and open depending on appropriateness). Furthermore, nephrectomy appears to be an independent risk factor for the development of chronic kidney disease suggesting that nephron sparing approaches should be implored whenever feasible. We reviewed these options in context of the patients current situation as well as the pros and cons of each.  -At this point in time, if no strongly urged surveillance to assess for interval growth rate.  If there is significant growth greater than 3 mm annually, would recommend intervention.  Intervention could possibly include partial nephrectomy via laparoscopic or robotic approach versus ablative therapy.  They are both in agreement with this plan.  -  Follow-up with MRI abdomen with and without contrast in 6 months  2.  Angiomyolipoma -Incidental, benign  -When these lesions are greater than about 4 cm, there is a risk of spontaneous hemorrhage albeit low.  We will continue to monitor growth rate for this as well as above.   3.  Microscopic hematuria 3-10 red blood cells per high-powered field in the absence of infection  Recommend further evaluation with cystoscopy  Return for cysto.  I,Kailey Littlejohn,acting as a Education administrator for Alan Espy, MD.,have documented all relevant documentation on the behalf of Alan Espy, MD,as directed by  Alan Espy, MD while  in the presence of Alan Campbell, [place] [address], [place], [place] [address] [phone]   I have reviewed the above documentation for accuracy and completeness, and I agree with the above.   Alan Espy, MD

## 2021-04-27 ENCOUNTER — Encounter: Payer: Self-pay | Admitting: Urology

## 2021-04-27 ENCOUNTER — Ambulatory Visit (INDEPENDENT_AMBULATORY_CARE_PROVIDER_SITE_OTHER): Payer: Medicare Other | Admitting: Urology

## 2021-04-27 ENCOUNTER — Other Ambulatory Visit: Payer: Self-pay

## 2021-04-27 VITALS — BP 185/100 | HR 68 | Ht 71.0 in | Wt 243.0 lb

## 2021-04-27 DIAGNOSIS — N2889 Other specified disorders of kidney and ureter: Secondary | ICD-10-CM | POA: Diagnosis not present

## 2021-04-27 LAB — URINALYSIS, COMPLETE
Bilirubin, UA: NEGATIVE
Glucose, UA: NEGATIVE
Leukocytes,UA: NEGATIVE
Nitrite, UA: NEGATIVE
Specific Gravity, UA: 1.025 (ref 1.005–1.030)
Urobilinogen, Ur: 1 mg/dL (ref 0.2–1.0)
pH, UA: 6.5 (ref 5.0–7.5)

## 2021-04-27 LAB — MICROSCOPIC EXAMINATION
Bacteria, UA: NONE SEEN
Epithelial Cells (non renal): NONE SEEN /hpf (ref 0–10)

## 2021-04-27 NOTE — Patient Instructions (Signed)

## 2021-05-15 NOTE — Progress Notes (Signed)
   05/16/2021 CC:  Chief Complaint  Patient presents with   Cysto      HPI: Alan Campbell is a 70 y.o. male with a personal history of retractile testis, hypogonadism, epididymal cyst, renal mass, microscopic hematuria and angiomyolipoma, who presents today for a cystoscopy.   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   04/19/2021 MRI abdomen with and without contrast revealed a 3.3 x 3.6 cm lesion on 22/6 which has macroscopic fat within consistent with angiomyolipoma , including T2 hypointensity on series 6 and hypointensity at the mass/renal parenchymal interface on out of phase image 61/13. 2.0 cm exophytic lesion is mildly T2 hyperintense on 34/6. Does not demonstrate macroscopic fat by MRI criteria. 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 Exophytic lower pole right renal enhancing mass, highly suspicious for renal cell carcinoma. These demonstrated on previous image.    Vitals:   05/16/21 1422  BP: (!) 194/101  Pulse: 63   NED. A&Ox3.   No respiratory distress   Abd soft, NT, ND Normal phallus with bilateral descended testicles  Cystoscopy Procedure Note  Patient identification was confirmed, informed consent was obtained, and patient was prepped using Betadine solution.  Lidocaine jelly was administered per urethral meatus.     Pre-Procedure: - Inspection reveals a normal caliber ureteral meatus.  Procedure: The flexible cystoscope was introduced without difficulty - No urethral strictures/lesions are present. - Normal prostate  - Normal bladder neck - Bilateral ureteral orifices identified - Bladder mucosa  reveals no ulcers, tumors, or lesions - No bladder stones - mild trabeculation  Retroflexion shows  unremarkable   Post-Procedure: - Patient tolerated the procedure well  Assessment/ Plan:  Microscopic hematuria  - cystoscopy today unremarkable   2. Renal mass/angiomyolipoma - MRI scheduled 5/23 for surveillance and assess interval growth    I,Kailey Littlejohn,acting as a scribe for Hollice Espy, MD.,have documented all relevant documentation on the behalf of Hollice Espy, MD,as directed by  Hollice Espy, MD while in the presence of Hollice Espy, MD.  I have reviewed the above documentation for accuracy and completeness, and I agree with the above.   Hollice Espy, MD

## 2021-05-16 ENCOUNTER — Encounter: Payer: Self-pay | Admitting: Urology

## 2021-05-16 ENCOUNTER — Ambulatory Visit (INDEPENDENT_AMBULATORY_CARE_PROVIDER_SITE_OTHER): Payer: Medicare Other | Admitting: Urology

## 2021-05-16 ENCOUNTER — Other Ambulatory Visit: Payer: Self-pay

## 2021-05-16 VITALS — BP 194/101 | HR 63 | Ht 71.0 in | Wt 242.0 lb

## 2021-05-16 DIAGNOSIS — N2889 Other specified disorders of kidney and ureter: Secondary | ICD-10-CM

## 2021-05-16 LAB — MICROSCOPIC EXAMINATION
Bacteria, UA: NONE SEEN
Epithelial Cells (non renal): NONE SEEN /hpf (ref 0–10)

## 2021-05-16 LAB — URINALYSIS, COMPLETE
Bilirubin, UA: NEGATIVE
Glucose, UA: NEGATIVE
Leukocytes,UA: NEGATIVE
Nitrite, UA: NEGATIVE
Specific Gravity, UA: >1.03 — ABNORMAL HIGH (ref 1.005–1.030)
Urobilinogen, Ur: 0.2 mg/dL (ref 0.2–1.0)
pH, UA: 6 (ref 5.0–7.5)

## 2021-06-01 ENCOUNTER — Encounter: Payer: Self-pay | Admitting: Family Medicine

## 2021-06-01 NOTE — Progress Notes (Signed)
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 Referred to Urologist and getting monitored. Had cystoscopy that was negative .At this time they will just keep monitoring it .   Aorta atherosclerosis/hyperlipidemia: : he is on statin therapy 20 mg but goal LDL is below 70 no and we will adjust dose to 40 mg and monitor .   Hypothyroidism: he is on levothyroxine 150 mcg seven days a week now,  last TSH was at goal  Denies change in bowel movements , dry skin  or dysphagia.    Hypogonadism: last PSA was normal again 02/2021 Testosterone helps with fatigue.   He states two pumps seems to be good for him.   OA  Bilateral   seen by [facility] but not severe enough for surgery,  he was taking NsAID's otc , however since proteinuria he has been taking Tylenol prn and we gave Tramadol in September that he only takes prn but needs a refill today    Metabolic syndrome: he was on Metformin for many years, but still had insulin resistance and weight was trending up, he has been on GLP agonists since Spring 2018. He lost weight but has been off medication secondary to cost around January 2021 .He changed his diet, cutting on carbs, portion control and has lost 13 lbs since last visit    HTN: taking medication daily and denies side effects, EKG was abnormal seen by Dr. Clayborn Bigness and had stress test and echo done 07/2017, mild right ventricular enlargement otherwise normal, he occasionally snores, denies decreased in exercise tolerance.BP today is at goal.   Echo 2019 at Dr. Etta Quill office   [place] FUNCTION WITH AN ESTIMATED EF = >55 %  NORMAL RIGHT VENTRICULAR SYSTOLIC FUNCTION  MILD TRICUSPID AND MITRAL VALVE INSUFFICIENCY  NO VALVULAR STENOSIS  MILD RV ENLARGEMENT  MILD  BIATRIAL ENLARGEMENT   Gout:no recent episodes.   Morbid obesity: he has BMI above 35 with HTN, dyslipidemia , OA. 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   Cyst of kidney, acquired 03/22/2021   Other proteinuria 03/22/2021   Lymphedema 03/22/2021   Other obesity 03/22/2021   Pain in joint of right knee 03/20/2021   Anemia 02/14/2021   Hypertension 02/14/2021   Posterior vitreous detachment, left eye 10/04/2020   Lymphedema 03/22/2020   Chronic venous insufficiency 03/22/2020   Morbid obesity ([place]) 02/29/2020   Right epiretinal membrane 10/05/2019   Posterior vitreous detachment of right eye 10/05/2019   Nuclear sclerotic cataract of left eye 10/05/2019   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 10/05/2019   Tendinitis of foot 09/15/2019   Epididymal cyst 06/24/2016   Benign essential HTN 12/30/2014   Dyslipidemia 12/30/2014   Adult hypothyroidism 67/05/4579   Dysmetabolic syndrome 99/83/3825   Arthritis, degenerative 12/30/2014   Perennial allergic rhinitis with seasonal variation 12/30/2014   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   Patella-femoral syndrome 12/03/2014   Low back pain 04/03/2010   Vitamin D deficiency 04/14/2009   OP (osteoporosis) 05/05/2007    Past Surgical History:  Procedure Laterality Date   BACK SURGERY  COLONOSCOPY WITH PROPOFOL N/A 08/14/2019   Procedure: COLONOSCOPY WITH PROPOFOL;  Surgeon: Lin Landsman, MD;  Location: [facility] ENDOSCOPY;  Service: Gastroenterology;  Laterality: N/A;  PRIORITY 4   EYE SURGERY  2016   right cataract removal   LAMINECTOMY  1985   L-5   SPINE SURGERY  1985    Family History  Problem Relation Age of Onset   Heart disease Father    Early death Father    Hypothyroidism Sister    Thyroid disease Sister    Hypothyroidism Sister     Migraines Sister    Prostate cancer Neg Hx    Bladder Cancer Neg Hx    Kidney cancer Neg Hx     Social History   Tobacco Use   Smoking status: Former    Packs/day: 0.50    Years: 5.00    Pack years: 2.50    Types: Cigarettes    Quit date: 10/03/1970    Years since quitting: 50.6   Smokeless tobacco: Never   Tobacco comments:    Quit 1972  Substance Use Topics   Alcohol use: Yes    Alcohol/week: 4.0 standard drinks    Types: 4 Standard drinks or equivalent per week     Current Outpatient Medications:    Cholecalciferol (VITAMIN D) 2000 units CAPS, Take 1 capsule (2,000 Units total) by mouth daily., Disp: 30 capsule, Rfl: 0   Cinnamon 500 MG TABS, Take 2,000 mg by mouth. , Disp: , Rfl:    CRANBERRY PO, Take 4,200 mg by mouth daily., Disp: , Rfl:    furosemide (LASIX) 20 MG tablet, Take 1 tablet (20 mg total) by mouth 2 (two) times daily., Disp: 180 tablet, Rfl: 1   levothyroxine (SYNTHROID) 150 MCG tablet, TAKE ONE TABLET BY MOUTH DAILY, Disp: 90 tablet, Rfl: 3   Multiple Vitamins-Minerals (CENTRUM SILVER 50+MEN) TABS, Take 1 tablet by mouth daily., Disp: , Rfl:    OMEGA-3 FATTY ACIDS PO, Take 1 tablet by mouth daily., Disp: , Rfl:    potassium chloride SA (KLOR-CON) 20 MEQ tablet, Take 1 tablet (20 mEq total) by mouth 2 (two) times daily. Take only when you take furosemide, Disp: 180 tablet, Rfl: 1   rosuvastatin (CRESTOR) 20 MG tablet, Take 1 tablet (20 mg total) by mouth daily., Disp: 90 tablet, Rfl: 1   Testosterone 20.25 MG/ACT (1.62%) GEL, Apply 2 each topically daily. 2 pumps daily, Disp: 75 g, Rfl: 5   valsartan (DIOVAN) 320 MG tablet, Take 1 tablet (320 mg total) by mouth daily., Disp: 90 tablet, Rfl: 1   verapamil (CALAN-SR) 240 MG CR tablet, Take 1 tablet (240 mg total) by mouth at bedtime., Disp: 90 tablet, Rfl: 1  No Known Allergies  I personally reviewed active problem list, medication list, allergies, family history, social history with the patient/caregiver  today.   ROS  Constitutional: Negative for fever or weight change.  Respiratory: Negative for cough and shortness of breath.   Cardiovascular: Negative for chest pain or palpitations.  Gastrointestinal: Negative for abdominal pain, no bowel changes.  Musculoskeletal: Negative for gait problem or joint swelling.  Skin: Negative for rash.  Neurological: Negative for dizziness or headache.  No other specific complaints in a complete review of systems (except as listed in HPI above).   Objective  Vitals:   06/02/21 0809  BP: 134/80  Pulse: 72  Resp: 16  Temp: 97.8 F (36.6 C)  SpO2: 98%  Weight: 246 lb (111.6 kg)  Height: 5\' 11"  (1.803  m)    Body mass index is 34.31 kg/m.  Physical Exam  Constitutional: Patient appears well-developed and well-nourished. Obese  No distress.  HEENT: head atraumatic, normocephalic, pupils equal and reactive to light, neck supple Cardiovascular: Normal rate, regular rhythm and normal heart sounds.  No murmur heard. Non pitting  BLE edema. Pulmonary/Chest: Effort normal and breath sounds normal. No respiratory distress. Abdominal: Soft.  There is no tenderness. Psychiatric: Patient has a normal mood and affect. behavior is normal. Judgment and thought content normal.  Muscular Skeletal: crepitus extension of both knees   Recent Results (from the past 2160 hour(s))  Urinalysis, Complete     Status: Abnormal   Collection Time: 04/27/21  8:33 AM  Result Value Ref Range   Specific Gravity, UA 1.025 1.005 - 1.030   pH, UA 6.5 5.0 - 7.5   Color, UA Yellow Yellow   Appearance Ur Clear Clear   Leukocytes,UA Negative Negative   Protein,UA 3+ (A) Negative/Trace   Glucose, UA Negative Negative   Ketones, UA Trace (A) Negative   RBC, UA 1+ (A) Negative   Bilirubin, UA Negative Negative   Urobilinogen, Ur 1.0 0.2 - 1.0 mg/dL   Nitrite, UA Negative Negative   Microscopic Examination See below:   Microscopic Examination     Status: Abnormal    Collection Time: 04/27/21  8:33 AM   Urine  Result Value Ref Range   WBC, UA 0-5 0 - 5 /hpf   RBC 3-10 (A) 0 - 2 /hpf   Epithelial Cells (non renal) None seen 0 - 10 /hpf   Casts Present (A) None seen /lpf   Cast Type Hyaline casts N/A   Bacteria, UA None seen None seen/Few  Urinalysis, Complete     Status: Abnormal   Collection Time: 05/16/21  2:22 PM  Result Value Ref Range   Specific Gravity, UA >1.030 (H) 1.005 - 1.030   pH, UA 6.0 5.0 - 7.5   Color, UA Yellow Yellow   Appearance Ur Clear Clear   Leukocytes,UA Negative Negative   Protein,UA 3+ (A) Negative/Trace   Glucose, UA Negative Negative   Ketones, UA Trace (A) Negative   RBC, UA Trace (A) Negative   Bilirubin, UA Negative Negative   Urobilinogen, Ur 0.2 0.2 - 1.0 mg/dL   Nitrite, UA Negative Negative   Microscopic Examination See below:   Microscopic Examination     Status: Abnormal   Collection Time: 05/16/21  2:22 PM   Urine  Result Value Ref Range   WBC, UA 0-5 0 - 5 /hpf   RBC 0-2 0 - 2 /hpf   Epithelial Cells (non renal) None seen 0 - 10 /hpf   Casts Present (A) None seen /lpf   Cast Type Hyaline casts N/A   Bacteria, UA None seen None seen/Few      PHQ2/9: Depression screen [facility] 2/9 06/02/2021 03/02/2021 11/30/2020 08/30/2020 07/26/2020  Decreased Interest 0 0 0 0 0  Down, Depressed, Hopeless 0 0 0 0 0  PHQ - 2 Score 0 0 0 0 0  Altered sleeping 0 - - - -  Tired, decreased energy 0 - - - -  Change in appetite 0 - - - -  Feeling bad or failure about yourself  0 - - - -  Trouble concentrating 0 - - - -  Moving slowly or fidgety/restless 0 - - - -  Suicidal thoughts 0 - - - -  PHQ-9 Score 0 - - - -  Difficult doing  work/chores - - - - -  Some recent data might be hidden    phq 9 is negative   Fall Risk: Fall Risk  06/02/2021 03/02/2021 11/30/2020 08/30/2020 07/26/2020  Falls in the past year? 0 0 0 0 0  Number falls in past yr: 0 0 0 0 0  Injury with Fall? 0 0 0 0 0  Risk for fall due to : No Fall  Risks No Fall Risks - - No Fall Risks  Follow up Falls prevention discussed Falls prevention discussed - - Falls prevention discussed      Functional Status Survey: Is the patient deaf or have difficulty hearing?: No Does the patient have difficulty seeing, even when wearing glasses/contacts?: No Does the patient have difficulty concentrating, remembering, or making decisions?: No Does the patient have difficulty walking or climbing stairs?: No Does the patient have difficulty dressing or bathing?: No Does the patient have difficulty doing errands alone such as visiting a doctor's office or shopping?: No    Assessment & Plan  1. Atherosclerosis of aorta (HCC)  We will increase dose of Crestor from 20 mg to 40 mg   2. Dyslipidemia  - rosuvastatin (CRESTOR) 40 MG tablet; Take 1 tablet (40 mg total) by mouth daily.  Dispense: 90 tablet; Refill: 1  3. Morbid obesity ([place])  Discussed with the patient the risk posed by an increased BMI. Discussed importance of portion control, calorie counting and at least 150 minutes of physical activity weekly. Avoid sweet beverages and drink more water. Eat at least 6 servings of fruit and vegetables daily    4. Benign essential HTN  At goal   5. Hypothyroidism, adult  Recheck next visit  6. Vitamin D deficiency   7. Albuminuria  Under the care of nephrologist   8. Primary osteoarthritis of both knees  - traMADol (ULTRAM) 50 MG tablet; Take 1 tablet (50 mg total) by mouth every 8 (eight) hours as needed for up to 5 days.  Dispense: 30 tablet; Refill: 0  9. Bilateral lower extremity edema  Better since off norvasc   10. Chronic venous insufficiency   11. Controlled gout   12. Dysmetabolic syndrome   13. Acquired hypothyroidism

## 2021-06-02 ENCOUNTER — Ambulatory Visit (INDEPENDENT_AMBULATORY_CARE_PROVIDER_SITE_OTHER): Payer: Medicare Other | Admitting: Family Medicine

## 2021-06-02 ENCOUNTER — Encounter: Payer: Self-pay | Admitting: Family Medicine

## 2021-06-02 VITALS — BP 134/80 | HR 72 | Temp 97.8°F | Resp 16 | Ht 71.0 in | Wt 246.0 lb

## 2021-06-02 DIAGNOSIS — I872 Venous insufficiency (chronic) (peripheral): Secondary | ICD-10-CM

## 2021-06-02 DIAGNOSIS — E785 Hyperlipidemia, unspecified: Secondary | ICD-10-CM

## 2021-06-02 DIAGNOSIS — E8881 Metabolic syndrome: Secondary | ICD-10-CM

## 2021-06-02 DIAGNOSIS — R809 Proteinuria, unspecified: Secondary | ICD-10-CM

## 2021-06-02 DIAGNOSIS — I10 Essential (primary) hypertension: Secondary | ICD-10-CM

## 2021-06-02 DIAGNOSIS — E039 Hypothyroidism, unspecified: Secondary | ICD-10-CM

## 2021-06-02 DIAGNOSIS — E559 Vitamin D deficiency, unspecified: Secondary | ICD-10-CM

## 2021-06-02 DIAGNOSIS — I700 Atherosclerosis of aorta: Secondary | ICD-10-CM

## 2021-06-02 DIAGNOSIS — R600 Localized edema: Secondary | ICD-10-CM

## 2021-06-02 DIAGNOSIS — M109 Gout, unspecified: Secondary | ICD-10-CM

## 2021-06-02 DIAGNOSIS — M170 Bilateral primary osteoarthritis of knee: Secondary | ICD-10-CM

## 2021-06-02 MED ORDER — TRAMADOL HCL 50 MG PO TABS
50.0000 mg | ORAL_TABLET | Freq: Three times a day (TID) | ORAL | 0 refills | Status: AC | PRN
Start: 2021-06-02 — End: 2021-06-07

## 2021-06-02 MED ORDER — ROSUVASTATIN CALCIUM 40 MG PO TABS: 40.0000 mg | ORAL_TABLET | Freq: Every day | ORAL | 1 refills | Status: DC

## 2021-07-24 ENCOUNTER — Telehealth: Payer: Self-pay

## 2021-07-24 NOTE — Telephone Encounter (Signed)
Copied from [place] [id number]. Topic: General - Other >> Jul 24, 2021  2:42 PM Pawlus, Brayton Layman A wrote: Reason for CRM: Pt called in regarding his upcoming appt on 2/9, pt wanted to know if Dr Ancil Boozer would like him to get any labs done beforehand, please advise.

## 2021-07-24 NOTE — Telephone Encounter (Signed)
Copied from [place] [phone]. Topic: General - Other >> Jul 24, 2021  2:42 PM Pawlus, Brayton Layman A wrote: Reason for CRM: Pt called in regarding his upcoming appt on 2/9, pt wanted to know if Dr Ancil Boozer would like him to get any labs done beforehand, please advise.

## 2021-07-27 ENCOUNTER — Ambulatory Visit (INDEPENDENT_AMBULATORY_CARE_PROVIDER_SITE_OTHER): Payer: Medicare Other

## 2021-07-27 VITALS — BP 142/86 | HR 61 | Temp 98.3°F | Resp 16 | Ht 71.0 in | Wt 245.0 lb

## 2021-07-27 DIAGNOSIS — Z0000 Encounter for general adult medical examination without abnormal findings: Secondary | ICD-10-CM

## 2021-07-27 NOTE — Progress Notes (Signed)
Subjective:   Alan Campbell is a 71 y.o. male who presents for Medicare Annual/Subsequent preventive examination.  Review of Systems     Cardiac Risk Factors include: advanced age (>77men, >92 women);male gender;obesity (BMI >30kg/m2);hypertension     Objective:    Today's Vitals   07/27/21 1014  BP: (!) 142/86  Pulse: 61  Resp: 16  Temp: 98.3 F (36.8 C)  TempSrc: Oral  SpO2: 98%  Weight: 245 lb (111.1 kg)  Height: 5\' 11"  (1.803 m)   Body mass index is 34.17 kg/m.  Advanced Directives 07/27/2021 07/26/2020 08/14/2019 09/07/2016 05/30/2016 02/27/2016 01/16/2016  Does Patient Have a Medical Advance Directive? Yes Yes Yes Yes Yes Yes Yes  Type of Paramedic of [place];Living will [place];Living will - [place];Living will [place];Living will [place];Living will [place];Living will  Does patient want to make changes to medical advance directive? - - - - - No - Patient declined No - Patient declined  Copy of [place] in Chart? No - copy requested No - copy requested - No - copy requested No - copy requested No - copy requested No - copy requested    Current Medications (verified) Outpatient Encounter Medications as of 07/27/2021  Medication Sig   Cholecalciferol (VITAMIN D) 2000 units CAPS Take 1 capsule (2,000 Units total) by mouth daily.   Cinnamon 500 MG TABS Take 2,000 mg by mouth.    CRANBERRY PO Take 4,200 mg by mouth daily.   furosemide (LASIX) 20 MG tablet Take 1 tablet (20 mg total) by mouth 2 (two) times daily.   levothyroxine (SYNTHROID) 150 MCG tablet TAKE ONE TABLET BY MOUTH DAILY   Multiple Vitamins-Minerals (CENTRUM SILVER 50+MEN) TABS Take 1 tablet by mouth daily.   OMEGA-3 FATTY ACIDS PO Take 1 tablet by mouth daily.   potassium chloride SA (KLOR-CON) 20 MEQ tablet Take 1 tablet (20 mEq total) by mouth 2 (two) times  daily. Take only when you take furosemide   rosuvastatin (CRESTOR) 40 MG tablet Take 1 tablet (40 mg total) by mouth daily.   Testosterone 20.25 MG/ACT (1.62%) GEL Apply 2 each topically daily. 2 pumps daily   valsartan (DIOVAN) 320 MG tablet Take 1 tablet (320 mg total) by mouth daily.   verapamil (CALAN-SR) 240 MG CR tablet Take 1 tablet (240 mg total) by mouth at bedtime.   No facility-administered encounter medications on file as of 07/27/2021.    Allergies (verified) Patient has no known allergies.   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 Moderate to severe atypia, edge involved.   Hyperlipidemia    Hypertension    Low back pain    Migraine    Obesity    OP (osteoporosis)    Thyroid disease    Vitamin D deficiency    Vitreomacular adhesion of left eye 10/05/2019   Past Surgical History:  Procedure Laterality Date   BACK SURGERY     COLONOSCOPY WITH PROPOFOL N/A 08/14/2019   Procedure: COLONOSCOPY WITH PROPOFOL;  Surgeon: Lin Landsman, MD;  Location: [facility] ENDOSCOPY;  Service: Gastroenterology;  Laterality: N/A;  PRIORITY 4   EYE SURGERY  2016   right cataract removal   LAMINECTOMY  1985   L-5   SPINE  SURGERY  1985   Family History  Problem Relation Age of Onset   Heart disease Father    Early death Father    Hypothyroidism Sister    Thyroid disease Sister    Hypothyroidism Sister    Migraines Sister    Prostate cancer Neg Hx    Bladder Cancer Neg Hx    Kidney cancer Neg Hx    Social History   Socioeconomic History   Marital status: Married    Spouse name: donna   Number of children: 0   Years of education: Not on file   Highest education level: Not on file  Occupational History   Occupation: Investment banker, corporate   Tobacco Use   Smoking status: Former    Packs/day: 0.50    Years: 5.00    Pack years: 2.50     Types: Cigarettes    Quit date: 10/03/1970    Years since quitting: 50.8   Smokeless tobacco: Never   Tobacco comments:    Quit 1972  Vaping Use   Vaping Use: Never used  Substance and Sexual Activity   Alcohol use: Yes    Alcohol/week: 4.0 standard drinks    Types: 4 Standard drinks or equivalent per week   Drug use: No   Sexual activity: Yes    Partners: Female  Other Topics Concern   Not on file  Social History Narrative   Married since 1974, no children but they helped raise Tretha Sciara ( wife's niece), also helped raise their God-daughter Lyndee Leo   He is planning on retiring January 2021   Social Determinants of Radio broadcast assistant Strain: Low Risk    Difficulty of Paying Living Expenses: Not hard at all  Food Insecurity: No Food Insecurity   Worried About Charity fundraiser in the Last Year: Never true   Arboriculturist in the Last Year: Never true  Transportation Needs: No Transportation Needs   Lack of Transportation (Medical): No   Lack of Transportation (Non-Medical): No  Physical Activity: Inactive   Days of Exercise per Week: 0 days   Minutes of Exercise per Session: 0 min  Stress: No Stress Concern Present   Feeling of Stress : Not at all  Social Connections: Socially Integrated   Frequency of Communication with Friends and Family: More than three times a week   Frequency of Social Gatherings with Friends and Family: Three times a week   Attends Religious Services: More than 4 times per year   Active Member of Clubs or Organizations: Yes   Attends Music therapist: More than 4 times per year   Marital Status: Married    Tobacco Counseling Counseling given: Not Answered Tobacco comments: Quit 1972   Clinical Intake:  Pre-visit preparation completed: Yes  Pain : No/denies pain     BMI - recorded: 34.17 Nutritional Status: BMI > 30  Obese Nutritional Risks: None Diabetes: No  How often do you need to have someone help you when  you read instructions, pamphlets, or other written materials from your doctor or pharmacy?: 1 - Never    Interpreter Needed?: No  Information entered by :: Clemetine Marker LPN   Activities of Daily Living In your present state of health, do you have any difficulty performing the following activities: 07/27/2021 06/02/2021  Hearing? N N  Vision? N N  Difficulty concentrating or making decisions? N N  Walking or climbing stairs? N N  Dressing or bathing? N N  Doing errands, shopping?  N N  Preparing Food and eating ? N -  Using the Toilet? N -  In the past six months, have you accidently leaked urine? N -  Do you have problems with loss of bowel control? N -  Managing your Medications? N -  Managing your Finances? N -  Housekeeping or managing your Housekeeping? N -  Some recent data might be hidden    Patient Care Team: [name], MD as PCP - General (Family Medicine) Yolonda Kida, MD as Consulting Physician (Cardiology) Hollice Espy, MD as Consulting Physician (Urology) [facility] [place], Elmer Ramp., MD (Dentistry) Ortho, [place] Lyla Son, MD as Consulting Physician (Nephrology) Ralene Bathe, MD (Dermatology)  Indicate any recent Medical Services you may have received from other than [facility] providers in the past year (date may be approximate).     Assessment:   This is a routine wellness examination for [facility].  Hearing/Vision screen Hearing Screening - Comments:: Pt denies hearing difficulty Vision Screening - Comments:: Annual vision screenings done by Dr. Zadie Rhine  Dietary issues and exercise activities discussed: Current Exercise Habits: The patient does not participate in regular exercise at present, Exercise limited by: orthopedic condition(s)   Goals Addressed             This Visit's Progress    Increase physical activity   On track    Recommend increasing low impact physical activity to at least 3 days per  week        Depression Screen PHQ 2/9 Scores 07/27/2021 06/02/2021 03/02/2021 11/30/2020 08/30/2020 07/26/2020 05/31/2020  PHQ - 2 Score 0 0 0 0 0 0 0  PHQ- 9 Score 0 0 - - - - -    Fall Risk Fall Risk  07/27/2021 06/02/2021 03/02/2021 11/30/2020 08/30/2020  Falls in the past year? 0 0 0 0 0  Number falls in past yr: 0 0 0 0 0  Injury with Fall? 0 0 0 0 0  Risk for fall due to : No Fall Risks No Fall Risks No Fall Risks - -  Follow up Falls prevention discussed Falls prevention discussed Falls prevention discussed - -    FALL RISK PREVENTION PERTAINING TO THE HOME:  Any stairs in or around the home? Yes  If so, are there any without handrails? No  Home free of loose throw rugs in walkways, pet beds, electrical cords, etc? Yes  Adequate lighting in your home to reduce risk of falls? Yes   ASSISTIVE DEVICES UTILIZED TO PREVENT FALLS:  Life alert? No  Use of a cane, walker or w/c? No  Grab bars in the bathroom? No  Shower chair or bench in shower? Yes  Elevated toilet seat or a handicapped toilet? Yes   TIMED UP AND GO:  Was the test performed? Yes .  Length of time to ambulate 10 feet: 5 sec.   Gait steady and fast without use of assistive device  Cognitive Function: Normal cognitive status assessed by direct observation by this Nurse Health Advisor. No abnormalities found.       6CIT Screen 06/03/2017  What Year? 0 points  What month? 0 points  What time? 0 points  Count back from 20 0 points  Months in reverse 0 points  Repeat phrase 2 points  Total Score 2    Immunizations Immunization History  Administered Date(s) Administered   Fluad Quad(high Dose 65+) 03/21/2020, 04/03/2021   Influenza, High Dose Seasonal PF 03/18/2017, 02/21/2018, 04/20/2019  Influenza, Seasonal, Injecte, Preservative Fre 04/21/2011, 03/25/2014   Influenza-Unspecified 04/16/2015, 03/30/2016   PFIZER(Purple Top)SARS-COV-2 Vaccination 07/25/2019, 08/15/2019, 03/14/2020   Pneumococcal  Conjugate-13 05/30/2016   Pneumococcal Polysaccharide-23 12/24/2006, 05/06/2015   Tdap 12/30/2009, 06/10/2019   Zoster Recombinat (Shingrix) 06/02/2019   Zoster, Live 03/28/2012    TDAP status: Up to date  Flu Vaccine status: Up to date  Pneumococcal vaccine status: Up to date  Covid-19 vaccine status: Completed vaccines  Qualifies for Shingles Vaccine? Yes   Zostavax completed Yes   Shingrix Completed?: Yes due for second dose  Screening Tests Health Maintenance  Topic Date Due   Zoster Vaccines- Shingrix (2 of 2) 07/28/2019   COVID-19 Vaccine (4 - Booster for Pfizer series) 05/09/2020   COLONOSCOPY (Pts 45-31yrs Insurance coverage will need to be confirmed)  08/13/2022   TETANUS/TDAP  06/09/2029   Pneumonia Vaccine 18+ Years old  Completed   INFLUENZA VACCINE  Completed   Hepatitis C Screening  Completed   HPV VACCINES  Aged Out    Health Maintenance  Health Maintenance Due  Topic Date Due   Zoster Vaccines- Shingrix (2 of 2) 07/28/2019   COVID-19 Vaccine (4 - Booster for Pfizer series) 05/09/2020    Colorectal cancer screening: Type of screening: Colonoscopy. Completed 08/14/19. Repeat every 3 years  Lung Cancer Screening: (Low Dose CT Chest recommended if Age 34-80 years, 30 pack-year currently smoking OR have quit w/in 15years.) does not qualify.  Additional Screening:  Hepatitis C Screening: does qualify; Completed 09/07/14  Vision Screening: Recommended annual ophthalmology exams for early detection of glaucoma and other disorders of the eye. Is the patient up to date with their annual eye exam?  Yes  Who is the provider or what is the name of the office in which the patient attends annual eye exams? Dr. Zadie Rhine.   Dental Screening: Recommended annual dental exams for proper oral hygiene  Community Resource Referral / Chronic Care Management: CRR required this visit?  No   CCM required this visit?  No      Plan:     I have personally reviewed and  noted the following in the patients chart:   Medical and social history Use of alcohol, tobacco or illicit drugs  Current medications and supplements including opioid prescriptions. Patient is not currently taking opioid prescriptions. Functional ability and status Nutritional status Physical activity Advanced directives List of other physicians Hospitalizations, surgeries, and ER visits in previous 12 months Vitals Screenings to include cognitive, depression, and falls Referrals and appointments  In addition, I have reviewed and discussed with patient certain preventive protocols, quality metrics, and best practice recommendations. A written personalized care plan for preventive services as well as general preventive health recommendations were provided to patient.     Clemetine Marker, LPN   11/18/8464   Nurse Notes: none

## 2021-07-27 NOTE — Patient Instructions (Signed)
Alan Campbell , Thank you for taking time to come for your Medicare Wellness Visit. I appreciate your ongoing commitment to your health goals. Please review the following plan we discussed and let me know if I can assist you in the future.   Screening recommendations/referrals: Colonoscopy: done 08/14/19. Repeat 07/2022 Recommended yearly ophthalmology/optometry visit for glaucoma screening and checkup Recommended yearly dental visit for hygiene and checkup  Vaccinations: Influenza vaccine: done 04/03/21 Pneumococcal vaccine: done 05/30/16 Tdap vaccine: done 06/10/19 Shingles vaccine: done 06/02/19; due for second dose   Covid-19: done 07/25/19, 08/15/19 & 03/14/20  Advanced directives: Please bring a copy of your health care power of attorney and living will to the office at your convenience.   Conditions/risks identified: Recommend increasing physical activity as tolerated  Next appointment: Follow up in one year for your annual wellness visit.   Preventive Care 67 Years and Older, Male Preventive care refers to lifestyle choices and visits with your health care provider that can promote health and wellness. What does preventive care include? A yearly physical exam. This is also called an annual well check. Dental exams once or twice a year. Routine eye exams. Ask your health care provider how often you should have your eyes checked. Personal lifestyle choices, including: Daily care of your teeth and gums. Regular physical activity. Eating a healthy diet. Avoiding tobacco and drug use. Limiting alcohol use. Practicing safe sex. Taking low doses of aspirin every day. Taking vitamin and mineral supplements as recommended by your health care provider. What happens during an annual well check? The services and screenings done by your health care provider during your annual well check will depend on your age, overall health, lifestyle risk factors, and family history of disease. Counseling   Your health care provider may ask you questions about your: Alcohol use. Tobacco use. Drug use. Emotional well-being. Home and relationship well-being. Sexual activity. Eating habits. History of falls. Memory and ability to understand (cognition). Work and work Statistician. Screening  You may have the following tests or measurements: Height, weight, and BMI. Blood pressure. Lipid and cholesterol levels. These may be checked every 5 years, or more frequently if you are over 70 years old. Skin check. Lung cancer screening. You may have this screening every year starting at age 32 if you have a 30-pack-year history of smoking and currently smoke or have quit within the past 15 years. Fecal occult blood test (FOBT) of the stool. You may have this test every year starting at age 49. Flexible sigmoidoscopy or colonoscopy. You may have a sigmoidoscopy every 5 years or a colonoscopy every 10 years starting at age 56. Prostate cancer screening. Recommendations will vary depending on your family history and other risks. Hepatitis C blood test. Hepatitis B blood test. Sexually transmitted disease (STD) testing. Diabetes screening. This is done by checking your blood sugar (glucose) after you have not eaten for a while (fasting). You may have this done every 1-3 years. Abdominal aortic aneurysm (AAA) screening. You may need this if you are a current or former smoker. Osteoporosis. You may be screened starting at age 15 if you are at high risk. Talk with your health care provider about your test results, treatment options, and if necessary, the need for more tests. Vaccines  Your health care provider may recommend certain vaccines, such as: Influenza vaccine. This is recommended every year. Tetanus, diphtheria, and acellular pertussis (Tdap, Td) vaccine. You may need a Td booster every 10 years. Zoster vaccine. You may  need this after age 74. Pneumococcal 13-valent conjugate (PCV13) vaccine.  One dose is recommended after age 59. Pneumococcal polysaccharide (PPSV23) vaccine. One dose is recommended after age 63. Talk to your health care provider about which screenings and vaccines you need and how often you need them. This information is not intended to replace advice given to you by your health care provider. Make sure you discuss any questions you have with your health care provider. Document Released: 07/01/2015 Document Revised: 02/22/2016 Document Reviewed: 04/05/2015 Elsevier Interactive Patient Education  2017 [place] Prevention in the Home Falls can cause injuries. They can happen to people of all ages. There are many things you can do to make your home safe and to help prevent falls. What can I do on the outside of my home? Regularly fix the edges of walkways and driveways and fix any cracks. Remove anything that might make you trip as you walk through a door, such as a raised step or threshold. Trim any bushes or trees on the path to your home. Use bright outdoor lighting. Clear any walking paths of anything that might make someone trip, such as rocks or tools. Regularly check to see if handrails are loose or broken. Make sure that both sides of any steps have handrails. Any raised decks and porches should have guardrails on the edges. Have any leaves, snow, or ice cleared regularly. Use sand or salt on walking paths during winter. Clean up any spills in your garage right away. This includes oil or grease spills. What can I do in the bathroom? Use night lights. Install grab bars by the toilet and in the tub and shower. Do not use towel bars as grab bars. Use non-skid mats or decals in the tub or shower. If you need to sit down in the shower, use a plastic, non-slip stool. Keep the floor dry. Clean up any water that spills on the floor as soon as it happens. Remove soap buildup in the tub or shower regularly. Attach bath mats securely with double-sided  non-slip rug tape. Do not have throw rugs and other things on the floor that can make you trip. What can I do in the bedroom? Use night lights. Make sure that you have a light by your bed that is easy to reach. Do not use any sheets or blankets that are too big for your bed. They should not hang down onto the floor. Have a firm chair that has side arms. You can use this for support while you get dressed. Do not have throw rugs and other things on the floor that can make you trip. What can I do in the kitchen? Clean up any spills right away. Avoid walking on wet floors. Keep items that you use a lot in easy-to-reach places. If you need to reach something above you, use a strong step stool that has a grab bar. Keep electrical cords out of the way. Do not use floor polish or wax that makes floors slippery. If you must use wax, use non-skid floor wax. Do not have throw rugs and other things on the floor that can make you trip. What can I do with my stairs? Do not leave any items on the stairs. Make sure that there are handrails on both sides of the stairs and use them. Fix handrails that are broken or loose. Make sure that handrails are as long as the stairways. Check any carpeting to make sure that it is firmly attached  to the stairs. Fix any carpet that is loose or worn. Avoid having throw rugs at the top or bottom of the stairs. If you do have throw rugs, attach them to the floor with carpet tape. Make sure that you have a light switch at the top of the stairs and the bottom of the stairs. If you do not have them, ask someone to add them for you. What else can I do to help prevent falls? Wear shoes that: Do not have high heels. Have rubber bottoms. Are comfortable and fit you well. Are closed at the toe. Do not wear sandals. If you use a stepladder: Make sure that it is fully opened. Do not climb a closed stepladder. Make sure that both sides of the stepladder are locked into place. Ask  someone to hold it for you, if possible. Clearly mark and make sure that you can see: Any grab bars or handrails. First and last steps. Where the edge of each step is. Use tools that help you move around (mobility aids) if they are needed. These include: Canes. Walkers. Scooters. Crutches. Turn on the lights when you go into a dark area. Replace any light bulbs as soon as they burn out. Set up your furniture so you have a clear path. Avoid moving your furniture around. If any of your floors are uneven, fix them. If there are any pets around you, be aware of where they are. Review your medicines with your doctor. Some medicines can make you feel dizzy. This can increase your chance of falling. Ask your doctor what other things that you can do to help prevent falls. This information is not intended to replace advice given to you by your health care provider. Make sure you discuss any questions you have with your health care provider. Document Released: 03/31/2009 Document Revised: 11/10/2015 Document Reviewed: 07/09/2014 Elsevier Interactive Patient Education  2017 [employer].

## 2021-07-31 ENCOUNTER — Other Ambulatory Visit: Payer: Self-pay | Admitting: Family Medicine

## 2021-07-31 DIAGNOSIS — M109 Gout, unspecified: Secondary | ICD-10-CM

## 2021-08-01 ENCOUNTER — Encounter: Payer: Self-pay | Admitting: Family Medicine

## 2021-08-01 ENCOUNTER — Other Ambulatory Visit: Payer: Self-pay

## 2021-08-01 ENCOUNTER — Ambulatory Visit (INDEPENDENT_AMBULATORY_CARE_PROVIDER_SITE_OTHER): Payer: Medicare Other | Admitting: Family Medicine

## 2021-08-01 VITALS — BP 180/88 | HR 68 | Temp 98.4°F | Resp 16 | Ht 71.0 in | Wt 246.4 lb

## 2021-08-01 DIAGNOSIS — I10 Essential (primary) hypertension: Secondary | ICD-10-CM | POA: Diagnosis not present

## 2021-08-01 DIAGNOSIS — M109 Gout, unspecified: Secondary | ICD-10-CM | POA: Diagnosis not present

## 2021-08-01 MED ORDER — PREDNISONE 20 MG PO TABS: 20.0000 mg | ORAL_TABLET | Freq: Two times a day (BID) | ORAL | 0 refills | Status: DC

## 2021-08-01 MED ORDER — FEBUXOSTAT 40 MG PO TABS: 40.0000 mg | ORAL_TABLET | Freq: Every day | ORAL | 1 refills | Status: DC

## 2021-08-01 NOTE — Progress Notes (Signed)
Name: Alan Campbell   [name]: [id number]    DOB: [birth year]   Date:08/01/2021       Progress Note  Subjective  Chief Complaint  Foot Pain  HPI  Foot pain: he developed left ankle pain , swelling and redness about 10 days ago he took prednisone and pain improved however last night noticed pain recurring on his left first toe. Also some swelling. He took vicodin at night to control symptoms. Last uric acid was high, he developed first gout attack at age 71 but having more frequent episodes now. He cannot take NSAID's due to kidney disease. Discussed starting him on Uloric but to wait until gout attacks is gone , refill of prednisone to take prn   HTN: he left before we rechecked his bp, he states bp at home has been well controlled. 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   Cyst of kidney, acquired 03/22/2021   Other proteinuria 03/22/2021   Lymphedema 03/22/2021   Other obesity 03/22/2021   Pain in joint of right knee 03/20/2021   Anemia 02/14/2021   Hypertension 02/14/2021   Posterior vitreous detachment, left eye 10/04/2020   Lymphedema 03/22/2020   Chronic venous insufficiency 03/22/2020   Morbid obesity ([place]) 02/29/2020   Right epiretinal membrane 10/05/2019   Posterior vitreous detachment of right eye 10/05/2019   Nuclear sclerotic cataract of left eye 10/05/2019   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 10/05/2019   Tendinitis of foot 09/15/2019   Epididymal cyst 06/24/2016   Benign essential HTN 12/30/2014   Dyslipidemia 12/30/2014   Adult hypothyroidism 40/98/1191   Dysmetabolic syndrome 47/82/9562   Arthritis, degenerative 12/30/2014   Perennial allergic rhinitis with seasonal variation 12/30/2014   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   Patella-femoral syndrome 12/03/2014   Low back pain 04/03/2010    Vitamin D deficiency 04/14/2009   OP (osteoporosis) 05/05/2007    Past Surgical History:  Procedure Laterality Date   BACK SURGERY     COLONOSCOPY WITH PROPOFOL N/A 08/14/2019   Procedure: COLONOSCOPY WITH PROPOFOL;  Surgeon: Lin Landsman, MD;  Location: [facility] ENDOSCOPY;  Service: Gastroenterology;  Laterality: N/A;  PRIORITY 4   EYE SURGERY  2016   right cataract removal   LAMINECTOMY  1985   L-5   SPINE SURGERY  1985    Family History  Problem Relation Age of Onset   Heart disease Father    Early death Father    Hypothyroidism Sister    Thyroid disease Sister    Hypothyroidism Sister    Migraines Sister    Prostate cancer Neg Hx    Bladder Cancer Neg Hx    Kidney cancer Neg Hx     Social History   Tobacco Use   Smoking status: Former    Packs/day: 0.50    Years: 5.00    Pack years: 2.50    Types: Cigarettes    Quit date: 10/03/1970    Years since quitting: 50.8   Smokeless tobacco: Never   Tobacco comments:    Quit 1972  Substance Use Topics   Alcohol use: Yes    Alcohol/week: 4.0 standard drinks    Types: 4 Standard drinks or equivalent per week     Current Outpatient Medications:    Cholecalciferol (VITAMIN D) 2000 units CAPS, Take 1 capsule (2,000 Units total) by mouth daily., Disp:  30 capsule, Rfl: 0   Cinnamon 500 MG TABS, Take 2,000 mg by mouth. , Disp: , Rfl:    CRANBERRY PO, Take 4,200 mg by mouth daily., Disp: , Rfl:    furosemide (LASIX) 20 MG tablet, Take 1 tablet (20 mg total) by mouth 2 (two) times daily., Disp: 180 tablet, Rfl: 1   levothyroxine (SYNTHROID) 150 MCG tablet, TAKE ONE TABLET BY MOUTH DAILY, Disp: 90 tablet, Rfl: 3   Multiple Vitamins-Minerals (CENTRUM SILVER 50+MEN) TABS, Take 1 tablet by mouth daily., Disp: , Rfl:    OMEGA-3 FATTY ACIDS PO, Take 1 tablet by mouth daily., Disp: , Rfl:    potassium chloride SA (KLOR-CON) 20 MEQ tablet, Take 1 tablet (20 mEq total) by mouth 2 (two) times daily. Take only when you take  furosemide, Disp: 180 tablet, Rfl: 1   rosuvastatin (CRESTOR) 40 MG tablet, Take 1 tablet (40 mg total) by mouth daily., Disp: 90 tablet, Rfl: 1   Testosterone 20.25 MG/ACT (1.62%) GEL, Apply 2 each topically daily. 2 pumps daily, Disp: 75 g, Rfl: 5   valsartan (DIOVAN) 320 MG tablet, Take 1 tablet (320 mg total) by mouth daily., Disp: 90 tablet, Rfl: 1   verapamil (CALAN-SR) 240 MG CR tablet, Take 1 tablet (240 mg total) by mouth at bedtime., Disp: 90 tablet, Rfl: 1  No Known Allergies  I personally reviewed active problem list, medication list, allergies, family history, social history, health maintenance with the patient/caregiver today.   ROS  Ten systems reviewed and is negative except as mentioned in HPI   Objective  Vitals:   08/01/21 1522  BP: (!) 180/88  Pulse: 68  Resp: 16  Temp: 98.4 F (36.9 C)  TempSrc: Oral  SpO2: 96%  Weight: 246 lb 6.4 oz (111.8 kg)  Height: 5\' 11"  (1.803 m)    Body mass index is 34.37 kg/m.  Physical Exam  Constitutional: Patient appears well-developed and well-nourished. Obese  No distress.  HEENT: head atraumatic, normocephalic, pupils equal and reactive to light, neck supple Cardiovascular: Normal rate, regular rhythm and normal heart sounds.  No murmur heard. Trace  BLE edema. Pulmonary/Chest: Effort normal and breath sounds normal. No respiratory distress. Abdominal: Soft.  There is no tenderness. Muscular skeletal: pan during palpation of left first toe MCP, no redness or increase in warmth, pain during palpation of mcp at plantar area also present  Psychiatric: Patient has a normal mood and affect. behavior is normal. Judgment and thought content normal.   Recent Results (from the past 2160 hour(s))  Urinalysis, Complete     Status: Abnormal   Collection Time: 05/16/21  2:22 PM  Result Value Ref Range   Specific Gravity, UA >1.030 (H) 1.005 - 1.030   pH, UA 6.0 5.0 - 7.5   Color, UA Yellow Yellow   Appearance Ur Clear Clear    Leukocytes,UA Negative Negative   Protein,UA 3+ (A) Negative/Trace   Glucose, UA Negative Negative   Ketones, UA Trace (A) Negative   RBC, UA Trace (A) Negative   Bilirubin, UA Negative Negative   Urobilinogen, Ur 0.2 0.2 - 1.0 mg/dL   Nitrite, UA Negative Negative   Microscopic Examination See below:   Microscopic Examination     Status: Abnormal   Collection Time: 05/16/21  2:22 PM   Urine  Result Value Ref Range   WBC, UA 0-5 0 - 5 /hpf   RBC 0-2 0 - 2 /hpf   Epithelial Cells (non renal) None seen 0 - 10 /hpf  Casts Present (A) None seen /lpf   Cast Type Hyaline casts N/A   Bacteria, UA None seen None seen/Few    PHQ2/9: Depression screen [facility] 2/9 08/01/2021 07/27/2021 06/02/2021 03/02/2021 11/30/2020  Decreased Interest 0 0 0 0 0  Down, Depressed, Hopeless 0 0 0 0 0  PHQ - 2 Score 0 0 0 0 0  Altered sleeping 0 0 0 - -  Tired, decreased energy 0 0 0 - -  Change in appetite 0 0 0 - -  Feeling bad or failure about yourself  0 0 0 - -  Trouble concentrating 0 0 0 - -  Moving slowly or fidgety/restless 0 0 0 - -  Suicidal thoughts 0 0 0 - -  PHQ-9 Score 0 0 0 - -  Difficult doing work/chores Not difficult at all - - - -  Some recent data might be hidden    phq 9 is negative   Fall Risk: Fall Risk  08/01/2021 07/27/2021 06/02/2021 03/02/2021 11/30/2020  Falls in the past year? 0 0 0 0 0  Number falls in past yr: 0 0 0 0 0  Injury with Fall? 0 0 0 0 0  Risk for fall due to : No Fall Risks No Fall Risks No Fall Risks No Fall Risks -  Follow up Falls prevention discussed Falls prevention discussed Falls prevention discussed Falls prevention discussed -      Functional Status Survey: Is the patient deaf or have difficulty hearing?: No Does the patient have difficulty seeing, even when wearing glasses/contacts?: No Does the patient have difficulty concentrating, remembering, or making decisions?: No Does the patient have difficulty walking or climbing stairs?: No Does the  patient have difficulty dressing or bathing?: No Does the patient have difficulty doing errands alone such as visiting a doctor's office or shopping?: No    Assessment & Plan   1. Podagra  - febuxostat (ULORIC) 40 MG tablet; Take 1 tablet (40 mg total) by mouth daily.  Dispense: 90 tablet; Refill: 1 - predniSONE (DELTASONE) 20 MG tablet; Take 1 tablet (20 mg total) by mouth 2 (two) times daily with a meal. For 3-5 days prn  Dispense: 20 tablet; Refill: 0   3. Uncontrolled hypertension  Advised to continue to monitor and return sooner if bp remains high

## 2021-08-18 ENCOUNTER — Ambulatory Visit: Payer: Medicare Other

## 2021-08-23 ENCOUNTER — Other Ambulatory Visit: Payer: Self-pay | Admitting: Family Medicine

## 2021-08-23 DIAGNOSIS — E039 Hypothyroidism, unspecified: Secondary | ICD-10-CM

## 2021-09-22 ENCOUNTER — Telehealth: Payer: Self-pay | Admitting: *Deleted

## 2021-09-22 NOTE — Chronic Care Management (AMB) (Signed)
?  Care Management  ? ?Note ? ?09/22/2021 ?Name: BENNET KUJAWA [name]: [id number] DOB: 1950/08/25 ? ?THELTON GRACA is a 71 y.o. year old male who is a primary care patient of [name], MD. I reached out to [employer]	[place] by phone today offer care coordination services.  ? ?Mr. Saindon was given information about care management services today including:  ?Care management services include personalized support from designated clinical staff supervised by his physician, including individualized plan of care and coordination with other care providers ?24/7 contact phone numbers for assistance for urgent and routine care needs. ?The patient may stop care management services at any time by phone call to the office staff. ? ?Patient agreed to services and verbal consent obtained.  ? ?Follow up plan: ?Telephone appointment with care management team member scheduled for: 10/04/2021 ? ?Kaikoa Magro, CCMA ?Care Guide, Embedded Care Coordination ?[place]  Care Management  ?Direct Dial: [phone] ? ? ?

## 2021-10-04 ENCOUNTER — Ambulatory Visit: Payer: Medicare Other

## 2021-10-04 DIAGNOSIS — I10 Essential (primary) hypertension: Secondary | ICD-10-CM

## 2021-10-04 DIAGNOSIS — E785 Hyperlipidemia, unspecified: Secondary | ICD-10-CM

## 2021-10-04 NOTE — Patient Instructions (Signed)
Thank you for allowing the Care Management team to participate in your care. It was great speaking with you today! ?Please do not hesitate to call if you require additional assistance. ? ? ?Carter Kaman,RN ?[place]/[facility] Care Management ?[facility] ?[phone] ? ?

## 2021-10-04 NOTE — Chronic Care Management (AMB) (Signed)
? Care Management ?  ? RN Visit Note ? ?10/04/2021 ?Name: Alan Campbell [name]: [id number] DOB: Sep 10, 1950 ? ?Subjective: ?Alan Campbell is a 71 y.o. year old male who is a primary care patient of Alan Sizer, MD. The care management team was consulted for assistance with disease management and care coordination needs.   ? ?Engaged with patient by telephone for initial visit in response to provider referral for case management and care coordination services.  ? ?Consent to Services:  ? [name] was given information about Care Management services including:  ?Care Management services includes personalized support from designated clinical staff supervised by his physician, including individualized plan of care and coordination with other care providers ?24/7 contact phone numbers for assistance for urgent and routine care needs. ?The patient may stop case management services at any time by phone call to the office staff. ? ?Patient agreed to services and consent obtained.  ? ?Assessment: Review of patient past medical history, allergies, medications, health status, including review of consultants reports, laboratory and other test data, was performed as part of comprehensive evaluation and provision of chronic care management services.  ? ?SDOH (Social Determinants of Health) assessments and interventions performed:  ?SDOH Interventions   ? ?Flowsheet Row Most Recent Value  ?SDOH Interventions   ?Food Insecurity Interventions Intervention Not Indicated  ?Transportation Interventions Intervention Not Indicated  ? ?  ?  ? ?Care Plan ? ?No Known Allergies ? ?Outpatient Encounter Medications as of 10/04/2021  ?Medication Sig  ? Cholecalciferol (VITAMIN D) 2000 units CAPS Take 1 capsule (2,000 Units total) by mouth daily.  ? Cinnamon 500 MG TABS Take 2,000 mg by mouth.   ? CRANBERRY PO Take 4,200 mg by mouth daily.  ? febuxostat (ULORIC) 40 MG tablet Take 1 tablet (40 mg total) by mouth daily.  ? furosemide (LASIX) 20 MG  tablet Take 1 tablet (20 mg total) by mouth 2 (two) times daily.  ? levothyroxine (SYNTHROID) 150 MCG tablet TAKE ONE TABLET BY MOUTH DAILY  ? Multiple Vitamins-Minerals (CENTRUM SILVER 50+MEN) TABS Take 1 tablet by mouth daily.  ? OMEGA-3 FATTY ACIDS PO Take 1 tablet by mouth daily.  ? potassium chloride SA (KLOR-CON) 20 MEQ tablet Take 1 tablet (20 mEq total) by mouth 2 (two) times daily. Take only when you take furosemide  ? predniSONE (DELTASONE) 20 MG tablet Take 1 tablet (20 mg total) by mouth 2 (two) times daily with a meal. For 3-5 days prn  ? rosuvastatin (CRESTOR) 40 MG tablet Take 1 tablet (40 mg total) by mouth daily.  ? Testosterone 20.25 MG/ACT (1.62%) GEL Apply 2 each topically daily. 2 pumps daily  ? valsartan (DIOVAN) 320 MG tablet Take 1 tablet (320 mg total) by mouth daily.  ? verapamil (CALAN-SR) 240 MG CR tablet Take 1 tablet (240 mg total) by mouth at bedtime.  ? ?No facility-administered encounter medications on file as of 10/04/2021.  ? ? ?Patient Active Problem List  ? Diagnosis Date Noted  ? Renal mass 04/26/2021  ? 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 Cyst of kidney, acquired 03/22/2021  ? Other proteinuria 03/22/2021  ? Lymphedema 03/22/2021  ? Other obesity 03/22/2021  ? Pain in joint of right knee 03/20/2021  ? Anemia 02/14/2021  ? Hypertension 02/14/2021  ? Posterior vitreous detachment, left eye 10/04/2020  ? Lymphedema 03/22/2020  ? Chronic venous insufficiency 03/22/2020  ? Morbid obesity ([place]) 02/29/2020  ? Right epiretinal membrane 10/05/2019  ? Posterior vitreous detachment of right eye  10/05/2019  ? Nuclear sclerotic cataract of left eye 10/05/2019  ? 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 Tendinitis of foot 09/15/2019  ? Epididymal cyst 06/24/2016  ? Benign essential HTN 12/30/2014  ? Dyslipidemia 12/30/2014  ? Adult hypothyroidism 12/30/2014  ? Dysmetabolic syndrome 09/62/8366  ? Arthritis, degenerative 12/30/2014  ? Perennial  allergic rhinitis with seasonal variation 12/30/2014  ? 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  ? Patella-femoral syndrome 12/03/2014  ? Low back pain 04/03/2010  ? Vitamin D deficiency 04/14/2009  ? OP (osteoporosis) 05/05/2007  ? ? ?Patient Care Plan: RN Care Management Plan of Care  ?  ? ?Problem Identified: Care Coordination   ?  ? ?Long-Range Goal: Disease Progression Prevented or Minimized   ?Note:   ?Current Barriers:  ?Chronic Disease Management support and education needs related to HTN and HLD  ? ?RNCM Clinical Goal(s):  ?Patient will demonstrate ongoing adherence to prescribed treatment plan for HTN and HLD. ? ?Interventions: ?1:1 collaboration with primary care provider regarding development and update of comprehensive plan of care as evidenced by provider attestation and co-signature ?Inter-disciplinary care team collaboration (see longitudinal plan of care) ?Evaluation of current treatment plan related to  self management and patient's adherence to plan as established by provider ? ? ?Hyperlipidemia Interventions:  (Status:  Patient declined further engagement on this goal.)  ?Lab Results  ?Component Value Date  ? CHOL 152 03/02/2021  ? HDL 41 03/02/2021  ? [place] 88 03/02/2021  ? TRIG 129 03/02/2021  ? CHOLHDL 3.7 03/02/2021  ?  ?Medications reviewed ?Reviewed provider established cholesterol goals ?Discussed importance of regular laboratory monitoring as prescribed ?Reviewed role and benefits of statin.  ?Reviewed importance of limiting foods high in cholesterol ?Reviewed exercise goals  ? ?Hypertension Interventions:  (Status:  Patient declined further engagement on this goal.)  ?Last practice recorded BP readings:  ?BP Readings from Last 3 Encounters:  ?08/01/21 (!) 180/88  ?07/27/21 (!) 142/86  ?06/02/21 134/80  ?Most recent eGFR/CrCl:  ?Lab Results  ?Component Value Date  ? EGFR 69 03/02/2021  ?  No components found for: CRCL ? ? ? ?Patient Goals/Self-Care Activities: ?Take all medications as  prescribed ?Attend all scheduled provider appointments ?Call pharmacy for medication refills 3-7 days in advance of running out of medications ?Call provider office for new concerns or questions  ? ?  ?  ? ?PLAN ?Mr. Willis will contact the clinic if he requires additional outreach. The care management team will gladly assist. ? ?Lin Glazier,RN ?[place]/[facility] Care Management ?[facility] ?[phone] ? ? ? ? ? ? ? ? ?

## 2021-10-05 ENCOUNTER — Other Ambulatory Visit: Payer: Self-pay | Admitting: Family Medicine

## 2021-10-05 DIAGNOSIS — I10 Essential (primary) hypertension: Secondary | ICD-10-CM

## 2021-10-10 ENCOUNTER — Encounter (INDEPENDENT_AMBULATORY_CARE_PROVIDER_SITE_OTHER): Payer: Medicare Other | Admitting: Ophthalmology

## 2021-10-17 ENCOUNTER — Ambulatory Visit (INDEPENDENT_AMBULATORY_CARE_PROVIDER_SITE_OTHER): Payer: Medicare Other | Admitting: Ophthalmology

## 2021-10-17 DIAGNOSIS — H353111 Nonexudative age-related macular degeneration, right eye, early dry stage: Secondary | ICD-10-CM | POA: Diagnosis not present

## 2021-10-17 DIAGNOSIS — H25042 Posterior subcapsular polar age-related cataract, left eye: Secondary | ICD-10-CM

## 2021-10-17 DIAGNOSIS — H35371 Puckering of macula, right eye: Secondary | ICD-10-CM

## 2021-10-17 NOTE — Assessment & Plan Note (Signed)
Minor findings OD no impact on acuity ?

## 2021-10-17 NOTE — Progress Notes (Signed)
? ? ?10/17/2021 ? ?  ? ?CHIEF COMPLAINT ?Patient presents for  ?Chief Complaint  ?Patient presents with  ? Epiretinal Membrane/preretinal Fibrosis/cellophane Maculopathy  ? ? ? ? ?HISTORY OF PRESENT ILLNESS: ?Alan Campbell is a 71 y.o. male who presents to the clinic today for:  ? ?HPI   ?1 yr fu ou oct. ?Pt stated no changes in vision. ?Pt denies FOL and floaters have decreased. ? ? ? ?Last edited by Silvestre Moment on 10/17/2021  8:09 AM.  ?  ? ? ?Referring physician: ?Vevelyn Royals, MD ?[address] ?[place],  [place] [address] ? ?HISTORICAL INFORMATION:  ? ?Selected notes from the [facility] ?  ? ?Lab Results  ?Component Value Date  ? HGBA1C 4.9 02/25/2020  ?  ? ?CURRENT MEDICATIONS: ?No current outpatient medications on file. (Ophthalmic Drugs)  ? ?No current facility-administered medications for this visit. (Ophthalmic Drugs)  ? ?Current Outpatient Medications (Other)  ?Medication Sig  ? Cholecalciferol (VITAMIN D) 2000 units CAPS Take 1 capsule (2,000 Units total) by mouth daily.  ? Cinnamon 500 MG TABS Take 2,000 mg by mouth.   ? CRANBERRY PO Take 4,200 mg by mouth daily.  ? febuxostat (ULORIC) 40 MG tablet Take 1 tablet (40 mg total) by mouth daily.  ? furosemide (LASIX) 20 MG tablet Take 1 tablet (20 mg total) by mouth 2 (two) times daily.  ? levothyroxine (SYNTHROID) 150 MCG tablet TAKE ONE TABLET BY MOUTH DAILY  ? Multiple Vitamins-Minerals (CENTRUM SILVER 50+MEN) TABS Take 1 tablet by mouth daily.  ? OMEGA-3 FATTY ACIDS PO Take 1 tablet by mouth daily.  ? potassium chloride SA (KLOR-CON) 20 MEQ tablet Take 1 tablet (20 mEq total) by mouth 2 (two) times daily. Take only when you take furosemide  ? predniSONE (DELTASONE) 20 MG tablet Take 1 tablet (20 mg total) by mouth 2 (two) times daily with a meal. For 3-5 days prn  ? rosuvastatin (CRESTOR) 40 MG tablet Take 1 tablet (40 mg total) by mouth daily.  ? Testosterone 20.25 MG/ACT (1.62%) GEL Apply 2 each topically daily. 2 pumps daily  ? valsartan  (DIOVAN) 320 MG tablet TAKE ONE TABLET BY MOUTH DAILY  ? verapamil (CALAN-SR) 240 MG CR tablet TAKE ONE TABLET BY MOUTH EVERY NIGHT AT BEDTIME  ? ?No current facility-administered medications for this visit. (Other)  ? ? ? ? ?REVIEW OF SYSTEMS: ?ROS   ?Negative for: Constitutional, Gastrointestinal, Neurological, Skin, Genitourinary, Musculoskeletal, HENT, Endocrine, Cardiovascular, Eyes, Respiratory, Psychiatric, Allergic/Imm, Heme/Lymph ?Last edited by Silvestre Moment on 10/17/2021  8:09 AM.  ?  ? ? ? ?ALLERGIES ?No Known Allergies ? ?PAST MEDICAL HISTORY ?Past Medical History:  ?Diagnosis Date  ? Allergy   ? Anemia, iron deficiency   ? 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 Cataract   ? Dysplastic nevus 04/24/2010  ? Left low back. Moderate to severe atypia, edge involved.  ? Hyperlipidemia   ? Hypertension   ? Low back pain   ? Migraine   ? Obesity   ? OP (osteoporosis)   ? Thyroid disease   ? Vitamin D deficiency   ? 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 ?Past Surgical History:  ?Procedure Laterality Date  ? BACK SURGERY    ? COLONOSCOPY WITH PROPOFOL N/A 08/14/2019  ? Procedure: COLONOSCOPY WITH PROPOFOL;  Surgeon: Lin Landsman, MD;  Location: [facility] ENDOSCOPY;  Service: Gastroenterology;  Laterality: N/A;  PRIORITY 4  ? EYE SURGERY  2016  ? right cataract removal  ?  LAMINECTOMY  1985  ? L-5  ? [place]  ? ? ?FAMILY HISTORY ?Family History  ?Problem Relation Age of Onset  ? Heart disease Father   ? Early death Father   ? Hypothyroidism Sister   ? Thyroid disease Sister   ? Hypothyroidism Sister   ? Migraines Sister   ? Prostate cancer Neg Hx   ? Bladder Cancer Neg Hx   ? Kidney cancer Neg Hx   ? ? ?SOCIAL HISTORY ?Social History  ? ?Tobacco Use  ? Smoking status: Former  ?  Packs/day: 0.50  ?  Years: 5.00  ?  Pack years: 2.50  ?  Types: Cigarettes  ?  Quit date: 10/03/1970  ?  Years since quitting: 51.0  ? Smokeless tobacco: Never  ? Tobacco comments:  ?  Quit 1972  ?Vaping Use  ? Vaping Use:  Never used  ?Substance Use Topics  ? Alcohol use: Yes  ?  Alcohol/week: 4.0 standard drinks  ?  Types: 4 Standard drinks or equivalent per week  ? Drug use: No  ? ?  ? ?  ? ?OPHTHALMIC EXAM: ? ?Base Eye Exam   ? ? Visual Acuity (ETDRS)   ? ?   Right Left  ? Dist [place] 20/20 -1 20/80  ? Dist ph [place]  20/30 -2  ? ?  ?  ? ? Tonometry (Tonopen, 8:15 AM)   ? ?   Right Left  ? Pressure 19 16  ? ?  ?  ? ? Pupils   ? ?   Pupils Dark Light Shape React APD  ? Right PERRL 3 2 Round Brisk None  ? Left PERRL 3 2 Round Brisk None  ? ?  ?  ? ? Visual Fields   ? ?   Left Right  ?  Full Full  ? ?  ?  ? ? Extraocular Movement   ? ?   Right Left  ?  Full Full  ? ?  ?  ? ? Neuro/Psych   ? ? Oriented x3: Yes  ? Mood/Affect: Normal  ? ?  ?  ? ? Dilation   ? ? Both eyes: 1.0% Mydriacyl, 2.5% Phenylephrine @ 8:15 AM  ? ?  ?  ? ?  ? ?Slit Lamp and Fundus Exam   ? ? External Exam   ? ?   Right Left  ? External Normal Normal  ? ?  ?  ? ? Slit Lamp Exam   ? ?   Right Left  ? Lids/Lashes Normal Normal  ? Conjunctiva/Sclera White and quiet White and quiet  ? Cornea Clear Clear  ? Anterior Chamber Deep and quiet Deep and quiet  ? Iris Round and reactive Round and reactive  ? Lens Posterior chamber intraocular lens 2+ Nuclear sclerosis, 1+ Posterior subcapsular cataract  ? Anterior Vitreous Normal Normal  ? ?  ?  ? ? Fundus Exam   ? ?   Right Left  ? Posterior Vitreous Posterior vitreous detachment Posterior vitreous detachment  ? Disc Normal Normal  ? C/D Ratio 0.1 0.1  ? Macula Epiretinal membrane, no macular thickening Normal, no macular thickening  ? Vessels Normal Normal  ? Periphery Normal Normal  ? ?  ?  ? ?  ? ? ?IMAGING AND PROCEDURES  ?Imaging and Procedures for 10/17/21 ? ?OCT, Retina - OU - Both Eyes   ? ?   ?Right Eye ?Quality was good. Scan locations included subfoveal. Central Foveal Thickness: 331.  Progression has been stable. Findings include epiretinal membrane.  ? ?Left Eye ?Quality was good. Scan locations included subfoveal.  Central Foveal Thickness: 297. Progression has been stable. Findings include normal foveal contour.  ? ?Notes ?OD with none distorting epiretinal membrane in good acuity will continue to observe, stable overall OD at 1 year ? ?OS with MAC adhesion present in April 2021 now resolved and PVD, no complication ? ?  ? ? ?  ?  ? ?  ?ASSESSMENT/PLAN: ? ?Right epiretinal membrane ?Stable OD year-over-year with no impact on acuity will continue to monitor and observe ? ?Posterior subcapsular age-related cataract, left eye ?Nonurgent follow-up with Dr Vevelyn Royals suggested.  Symptoms were reviewed with the patient.  Model I reviewed with the patient to discuss the new findings ? 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 ?  ICD-10-CM   ?1. Right epiretinal membrane  H35.371 OCT, Retina - OU - Both Eyes  ?  ?2. Posterior subcapsular age-related cataract, left eye  H25.042   ?  ?3. 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  ? ? ?1.  Mild to moderate epiretinal membrane and findings OD but no impact on acuity no foveal distortion continue to observe ?2.  Symptoms of the cataract in the left eye reviewed with the patient patient to follow-up with Dr. Noreene Larsson as scheduled or as needed ? ?3. ? ?Ophthalmic Meds Ordered this visit:  ?No orders of the defined types were placed in this encounter. ? ? ?  ? ?Return in about 1 year (around 10/18/2022) for DILATE OU, OCT. ? ?There are no Patient Instructions on file for this visit. ? ? ?Explained the diagnoses, plan, and follow up with the patient and they expressed understanding.  Patient expressed understanding of the importance of proper follow up care.  ? ?Clent Demark. Khrystal Jeanmarie M.D. ?Diseases & Surgery of the Retina and Vitreous ?[place] ?10/17/21 ? ? ? ? ?Abbreviations: ?M myopia (nearsighted); A astigmatism; H hyperopia (farsighted); P  presbyopia; Mrx spectacle prescription;  CTL contact lenses; OD right eye; OS left eye; OU both eyes  XT exotropia; ET esotropia; PEK punctate epithelial keratitis; PEE punctate epithelial erosions; DES dry eye syndrome; M

## 2021-10-17 NOTE — Assessment & Plan Note (Signed)
Nonurgent follow-up with Dr Vevelyn Royals suggested.  Symptoms were reviewed with the patient.  Model I reviewed with the patient to discuss the new findings ?

## 2021-10-17 NOTE — Assessment & Plan Note (Signed)
Stable OD year-over-year with no impact on acuity will continue to monitor and observe ?

## 2021-10-21 ENCOUNTER — Ambulatory Visit
Admission: RE | Admit: 2021-10-21 | Discharge: 2021-10-21 | Disposition: A | Discharge status: Home / Self Care | Payer: Medicare Other | Source: Ambulatory Visit | Attending: Urology | Admitting: Urology

## 2021-10-21 DIAGNOSIS — N2889 Other specified disorders of kidney and ureter: Secondary | ICD-10-CM | POA: Insufficient documentation

## 2021-10-21 IMAGING — MR MR ABDOMEN WO/W CM
18 series · 48 of 48 positions shown · IV contrast (gadavist)
Comparison: MRI abdomen [DATE]

CLINICAL DATA: Renal mass follow-up

EXAM:
MRI ABDOMEN WITHOUT AND WITH CONTRAST
TECHNIQUE: Multiplanar multisequence MR imaging of the abdomen was performed
both before and after the administration of intravenous contrast.
CONTRAST:  10mL GADAVIST GADOBUTROL 1 MMOL/ML IV SOLN

[Series 3: T2 · coronal · 6.0mm · 1.25mm/px · 2 of 36 slices shown (1 of 2)]
[im 1/36]
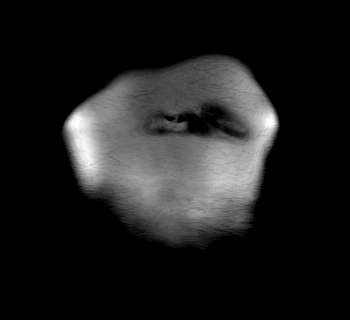
[im 36/36]
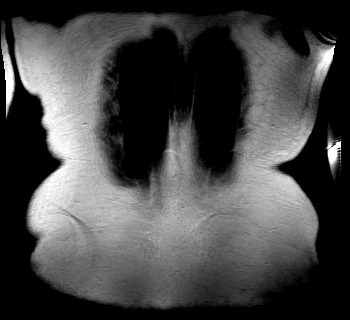

[Series 4: T2 · axial · 6.0mm · 1.25mm/px · z∈[-104,+133]mm · 2 of 34 slices shown (2 of 2)]
[im 1/34]
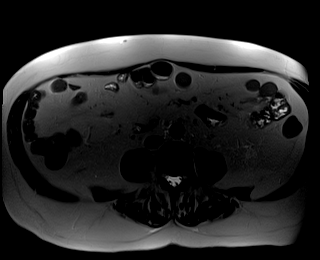
[im 34/34]
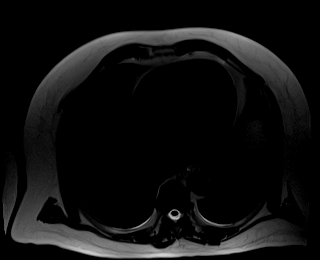

[Series 5: T1 · axial · 3.0mm · 1.25mm/px · z∈[-136,+101]mm · 4 of 80 slices shown (1 of 2)]
[im 1/80]
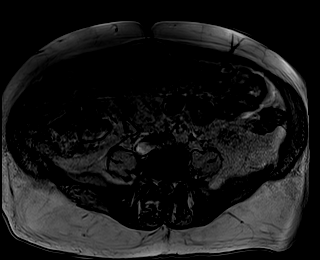
[im 27/80]
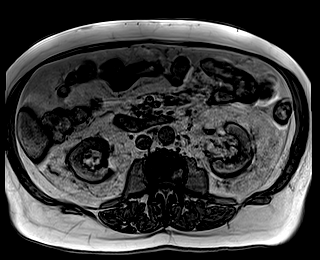
[im 53/80]
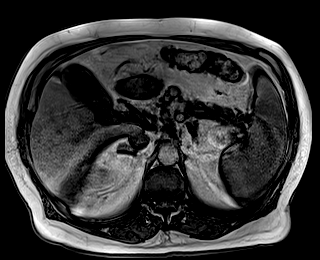
[im 80/80]
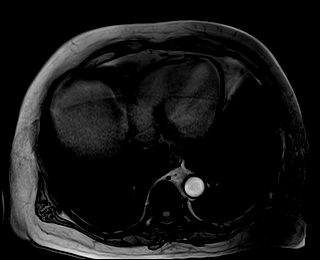

[Series 6: T1 · axial · 3.0mm · 1.25mm/px · z∈[-136,+101]mm · 3 of 80 slices shown (2 of 2)]
[im 1/80]
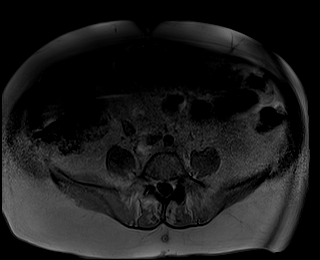
[im 40/80]
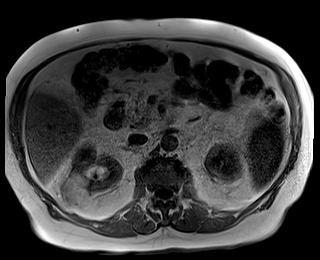
[im 80/80]
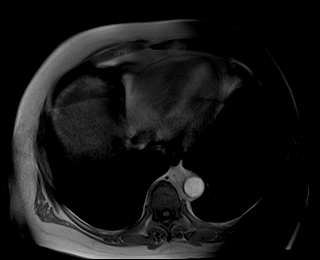

[Series 7: bSSFP · axial · 6.0mm · 0.78mm/px · 1 of 36 slices shown]
[im 1/36]
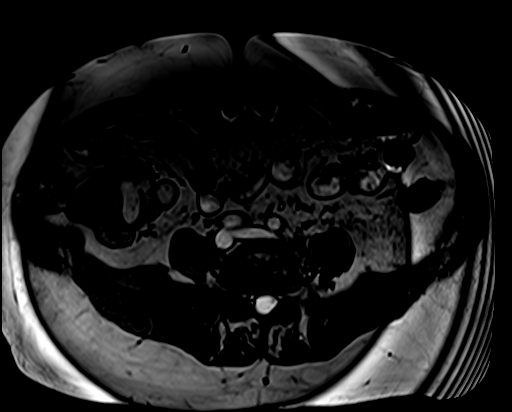

[Series 8: ax dwi_tracew · axial · 6.0mm · 1.49mm/px · z∈[-104,+133]mm · 4 of 102 slices shown]
[im 1/102]
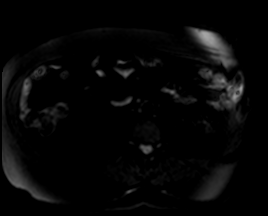
[im 34/102]
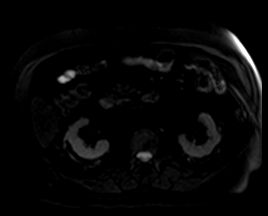
[im 68/102]
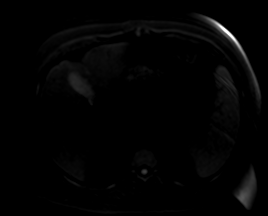
[im 102/102]
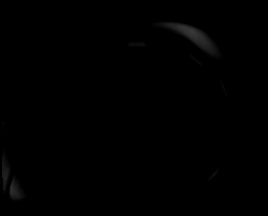

[Series 9: ax dwi_adc · axial · 6.0mm · 1.49mm/px · 1 of 34 slices shown]
[im 1/34]
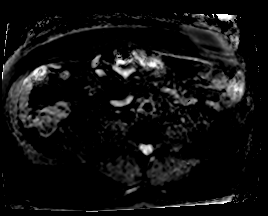

[Series 12: T2 fat-sat · axial · 6.0mm · 1.25mm/px · 1 of 34 slices shown]
[im 1/34]
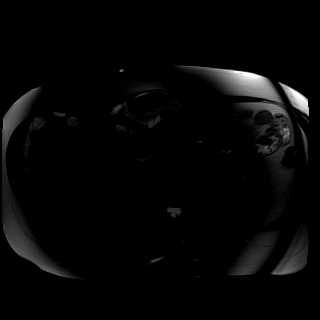

[Series 13: T1 dynamic fat-sat · axial · non-contrast · 3.0mm · 1.25mm/px · z∈[-130,+95]mm · 3 of 76 slices shown (1 of 5)]
[im 1/76]
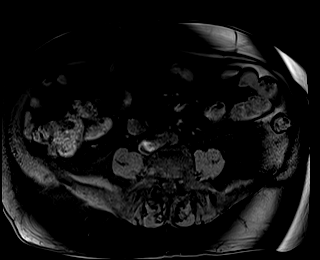
[im 38/76]
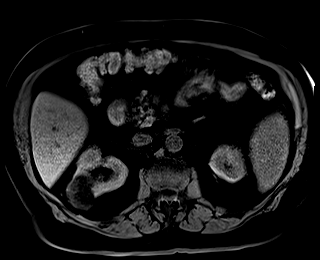
[im 76/76]
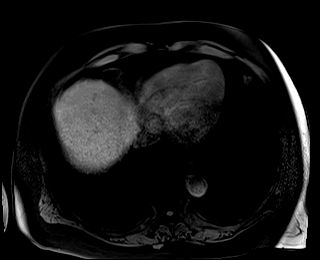

[Series 14: T1 dynamic fat-sat post-contrast · axial · 3.0mm · 1.25mm/px · z∈[-130,+95]mm · 3 of 76 slices shown (1 of 4)]
[im 1/76]
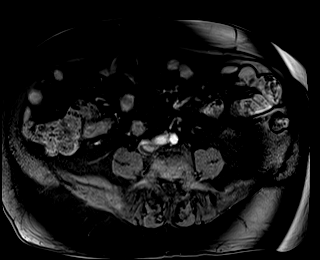
[im 38/76]
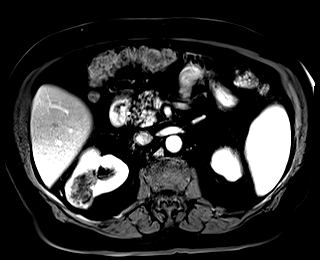
[im 76/76]
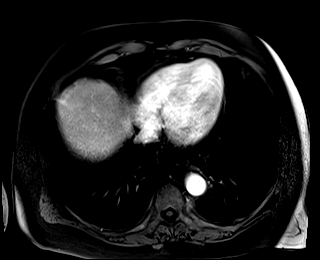

[Series 15: T1 dynamic fat-sat · axial · 3.0mm · 1.25mm/px · z∈[-130,+95]mm · 3 of 76 slices shown (2 of 5)]
[im 1/76]
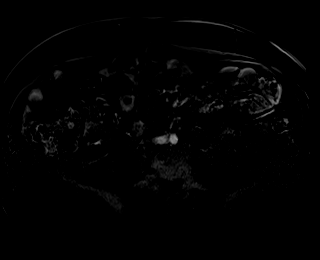
[im 38/76]
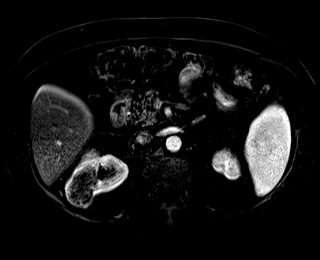
[im 76/76]
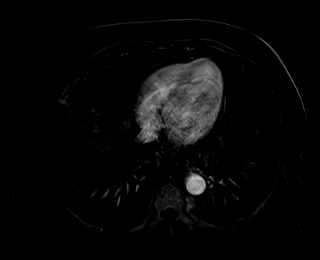

[Series 16: T1 dynamic fat-sat post-contrast · axial · 3.0mm · 1.25mm/px · z∈[-130,+95]mm · 3 of 76 slices shown (2 of 4)]
[im 1/76]
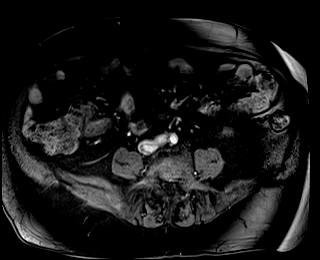
[im 38/76]
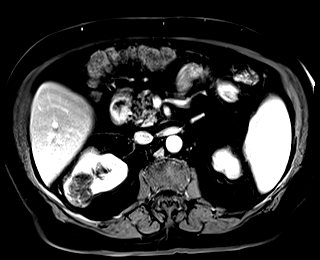
[im 76/76]
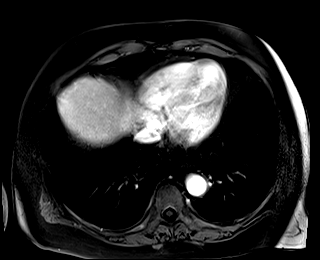

[Series 17: T1 dynamic fat-sat · axial · 3.0mm · 1.25mm/px · z∈[-130,+95]mm · 3 of 76 slices shown (3 of 5)]
[im 1/76]
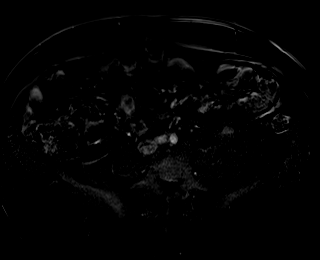
[im 38/76]
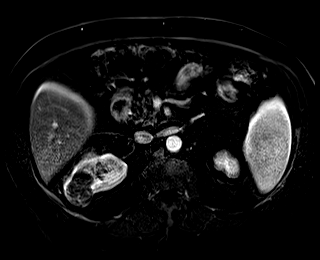
[im 76/76]
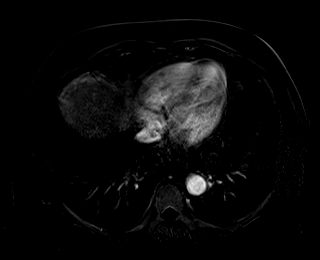

[Series 18: T1 dynamic fat-sat post-contrast · axial · 3.0mm · 1.25mm/px · z∈[-130,+95]mm · 3 of 76 slices shown (3 of 4)]
[im 1/76]
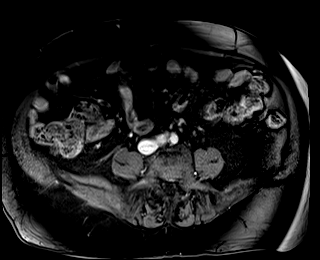
[im 38/76]
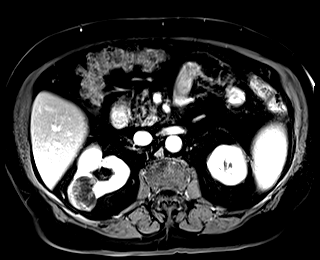
[im 76/76]
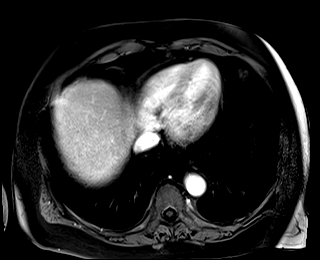

[Series 19: T1 dynamic fat-sat · axial · 3.0mm · 1.25mm/px · z∈[-130,+95]mm · 3 of 76 slices shown (4 of 5)]
[im 1/76]
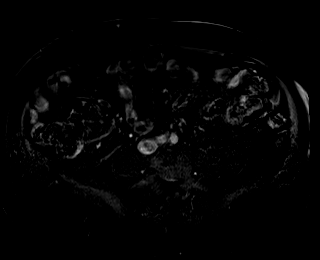
[im 38/76]
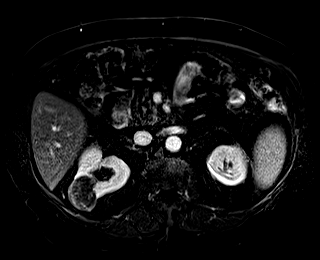
[im 76/76]
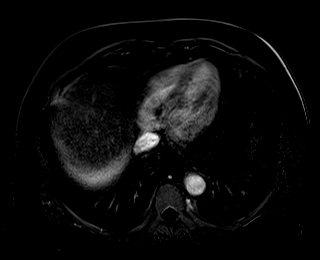

[Series 20: T1 dynamic post-contrast · coronal · 3.3mm · 1.38mm/px · 3 of 72 slices shown]
[im 1/72]
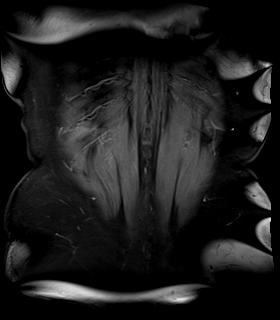
[im 36/72]
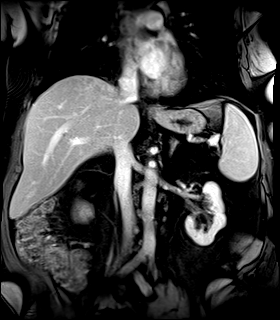
[im 72/72]
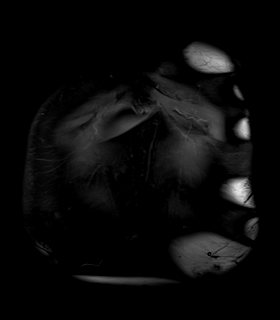

[Series 21: T1 dynamic fat-sat post-contrast · axial · 3.0mm · 1.25mm/px · z∈[-130,+95]mm · 3 of 76 slices shown (4 of 4)]
[im 1/76]
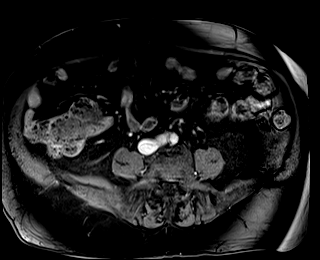
[im 38/76]
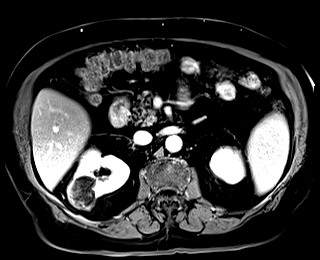
[im 76/76]
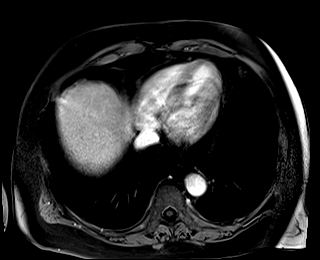

[Series 22: T1 dynamic fat-sat · axial · 3.0mm · 1.25mm/px · z∈[-130,+95]mm · 3 of 76 slices shown (5 of 5)]
[im 1/76]
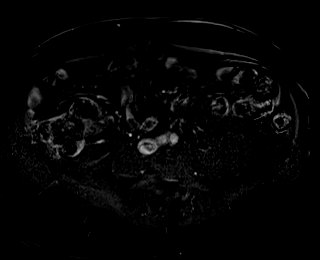
[im 38/76]
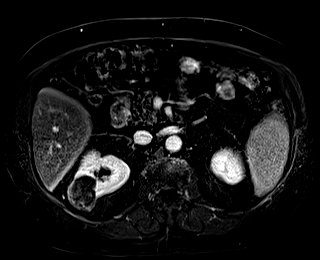
[im 76/76]
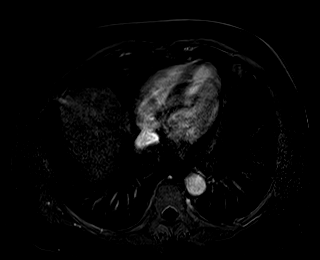

[48 of 48 positions shown; findings below may reference images not displayed]

FINDINGS: Lower chest: No acute findings.

Hepatobiliary: Liver is enlarged measuring 22 cm in length. Interval
decreased size of a now complex hemorrhagic and partially septated
cyst in the left hepatic lobe measuring 3.7 x 3.4 cm. A few
additional subcentimeter hepatic cysts identified scattered
throughout the liver. Gallbladder appears normal. No biliary ductal
dilatation.

Pancreas: No mass, inflammatory changes, or other parenchymal
abnormality identified.

Spleen:  Enlarged measuring 17.2 cm in length.

Adrenals/Urinary Tract: Adrenal glands are normal. A few simple
right renal cortical cysts identified measuring up to 1.7 cm in the
upper pole. Similar size and appearance of a predominantly fat
signal vascular mass in the upper pole right kidney measuring 4.4 x
3.3 cm consistent with angiomyolipoma. 0.7 cm similar signal
angiomyolipoma again seen in the mid left kidney. Slightly increased
size of the enhancing exophytic mass at the lower pole right kidney
measuring 2.4 x 2.5 cm. No hydronephrosis.

Stomach/Bowel: Mild colonic diverticulosis. No evidence of bowel
obstruction.

Vascular/Lymphatic: No pathologically enlarged lymph nodes
identified. Renal veins appear within normal limits. No abdominal
aortic aneurysm demonstrated.

Other:  No ascites.

Musculoskeletal: No suspicious bone lesions identified.
IMPRESSION: 1. Slight interval increased size of an exophytic enhancing mass at
the lower pole right kidney, consistent with RCC until proven
otherwise. No lymphadenopathy or vascular involvement identified.
2. Bilateral renal angiomyolipomas, largest in the upper pole right
kidney. Small right renal cysts.
3. Hepatic cysts including a complex left hepatic cyst.
4. Hepatosplenomegaly.
5. Mild colonic diverticulosis.

## 2021-10-21 MED ORDER — GADOBUTROL 1 MMOL/ML IV SOLN
10.0000 mL | Freq: Once | INTRAVENOUS | Status: AC | PRN
  Administered 2021-10-21: 10 mL via INTRAVENOUS

## 2021-10-24 NOTE — Progress Notes (Signed)
? ?10/25/2021 ?12:36 PM  ? ?[facility] ?January 05, 1951 ?[id number] ? ?Referring provider:  ?[name], MD ?[address] ?[place],  [place] [address] ?Chief Complaint  ?Patient presents with  ? Results  ? ? ?HPI: ?Alan Campbell is a 71 y.o.male with a personal history of retractile testis, hypogonadism, epididymal cyst, renal mass, microscopic hematuria and angiomyolipoma, who presents today for a 6 month follow-up with MRI results.  ? ?04/19/2021 MRI abdomen with and without contrast revealed a 3.3 x 3.6 cm lesion on 22/6 which has macroscopic fat within consistent with angiomyolipoma , including T2 hypointensity on series 6 and hypointensity at the mass/renal parenchymal interface on out of phase image 61/13. 2.0 cm exophytic lesion is mildly T2 hyperintense . ? ?He elected surveillance for his right lower pole renal mass.  He returns today with serial imaging. ? ?MRI on 10/21/2021 visualized slight interval increased size of an exophytic enhancing mass at the lower pole right kidney, now 2.4 cm. No lymphadenopathy or vascular involvement identified. 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 Small right renal cysts. Hepatic cysts including a complex left hepatic cyst. Hepatosplenomegaly. ? ?No flank pain or weight loss. ? ?He is actually gone back to work, working in [employer] coming out of retirement. ? ? ?PMH: ?Past Medical History:  ?Diagnosis Date  ? Allergy   ? Anemia, iron deficiency   ? 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 Cataract   ? Dysplastic nevus 04/24/2010  ? Left low back. Moderate to severe atypia, edge involved.  ? Hyperlipidemia   ? Hypertension   ? Low back pain   ? Migraine   ? Obesity   ? OP (osteoporosis)   ? Thyroid disease   ? Vitamin D deficiency   ? 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 ? ?Surgical History: ?Past Surgical History:  ?Procedure Laterality Date  ? BACK SURGERY    ? COLONOSCOPY WITH PROPOFOL N/A 08/14/2019  ? Procedure:  COLONOSCOPY WITH PROPOFOL;  Surgeon: Lin Landsman, MD;  Location: [facility] ENDOSCOPY;  Service: Gastroenterology;  Laterality: N/A;  PRIORITY 4  ? EYE SURGERY  2016  ? right cataract removal  ? LAMINECTOMY  1985  ? L-5  ? [place]  ? ? ?Home Medications:  ?Allergies as of 10/25/2021   ?No Known Allergies ?  ? ?  ?Medication List  ?  ? ?  ? Accurate as of Oct 25, 2021 12:36 PM. If you have any questions, ask your nurse or doctor.  ?  ?  ? ?  ? ?Centrum Silver 50+Men Tabs ?Take 1 tablet by mouth daily. ?  ?Cinnamon 500 MG Tabs ?Take 2,000 mg by mouth. ?  ?CRANBERRY PO ?Take 4,200 mg by mouth daily. ?  ?febuxostat 40 MG tablet ?Commonly known as: ULORIC ?Take 1 tablet (40 mg total) by mouth daily. ?  ?furosemide 20 MG tablet ?Commonly known as: LASIX ?Take 1 tablet (20 mg total) by mouth 2 (two) times daily. ?  ?levothyroxine 150 MCG tablet ?Commonly known as: SYNTHROID ?TAKE ONE TABLET BY MOUTH DAILY ?  ?OMEGA-3 FATTY ACIDS PO ?Take 1 tablet by mouth daily. ?  ?potassium chloride SA 20 MEQ tablet ?Commonly known as: KLOR-CON M ?Take 1 tablet (20 mEq total) by mouth 2 (two) times daily. Take only when you take furosemide ?  ?predniSONE 20 MG tablet ?Commonly known as: DELTASONE ?Take 1 tablet (20 mg total) by mouth 2 (two) times daily with a meal.  For 3-5 days prn ?  ?rosuvastatin 40 MG tablet ?Commonly known as: CRESTOR ?Take 1 tablet (40 mg total) by mouth daily. ?  ?Testosterone 20.25 MG/ACT (1.62%) Gel ?Apply 2 each topically daily. 2 pumps daily ?  ?valsartan 320 MG tablet ?Commonly known as: DIOVAN ?TAKE ONE TABLET BY MOUTH DAILY ?  ?verapamil 240 MG CR tablet ?Commonly known as: CALAN-SR ?TAKE ONE TABLET BY MOUTH EVERY NIGHT AT BEDTIME ?  ?Vitamin D 50 MCG (2000 UT) Caps ?Take 1 capsule (2,000 Units total) by mouth daily. ?  ? ?  ? ? ?Allergies:  ?No Known Allergies ? ?Family History: ?Family History  ?Problem Relation Age of Onset  ? Heart disease Father   ? Early death Father   ? Hypothyroidism  Sister   ? Thyroid disease Sister   ? Hypothyroidism Sister   ? Migraines Sister   ? Prostate cancer Neg Hx   ? Bladder Cancer Neg Hx   ? Kidney cancer Neg Hx   ? ? ?Social History:  reports that he quit smoking about 51 years ago. His smoking use included cigarettes. He has a 2.50 pack-year smoking history. He has never used smokeless tobacco. He reports current alcohol use of about 4.0 standard drinks per week. He reports that he does not use drugs. ? ? ?Physical Exam: ?BP (!) 150/100   Pulse 67   Ht '5\' 11"'$  (1.803 m)   Wt 246 lb (111.6 kg)   BMI 34.31 kg/m?   ?Constitutional:  Alert and oriented, No acute distress. ?HEENT: [place] AT, moist mucus membranes.  Trachea midline, no masses. ?Cardiovascular: No clubbing, cyanosis, or edema. ?Respiratory: Normal respiratory effort, no increased work of breathing. ?Skin: No rashes, bruises or suspicious lesions. ?Neurologic: Grossly intact, no focal deficits, moving all 4 extremities. ?Psychiatric: Normal mood and affect. ? ?Laboratory Data: ? ?Lab Results  ?Component Value Date  ? CREATININE 1.14 03/02/2021  ? ?Lab Results  ?Component Value Date  ? HGBA1C 4.9 02/25/2020  ? ? ?Pertinent Imaging: ?CLINICAL DATA:  Renal mass follow-up ?  ?EXAM: ?MRI ABDOMEN WITHOUT AND WITH CONTRAST ?  ?TECHNIQUE: ?Multiplanar multisequence MR imaging of the abdomen was performed ?both before and after the administration of intravenous contrast. ?  ?CONTRAST:  29m GADAVIST GADOBUTROL 1 MMOL/ML IV SOLN ?  ?COMPARISON:  MRI abdomen 04/19/2021 ?  ?FINDINGS: ?Lower chest: No acute findings. ?  ?Hepatobiliary: Liver is enlarged measuring 22 cm in length. Interval ?decreased size of a now complex hemorrhagic and partially septated ?cyst in the left hepatic lobe measuring 3.7 x 3.4 cm. A few ?additional subcentimeter hepatic cysts identified scattered ?throughout the liver. Gallbladder appears normal. No biliary ductal ?dilatation. ?  ?Pancreas: No mass, inflammatory changes, or other  parenchymal ?abnormality identified. ?  ?Spleen:  Enlarged measuring 17.2 cm in length. ?  ?Adrenals/Urinary Tract: Adrenal glands are normal. A few simple ?right renal cortical cysts identified measuring up to 1.7 cm in the ?upper pole. Similar size and appearance of a predominantly fat ?signal vascular mass in the upper pole right kidney measuring 4.4 x ?3.3 cm consistent with angiomyolipoma. 0.7 cm similar signal ?angiomyolipoma again seen in the mid left kidney. Slightly increased ?size of the enhancing exophytic mass at the lower pole right kidney ?measuring 2.4 x 2.5 cm. No hydronephrosis. ?  ?Stomach/Bowel: Mild colonic diverticulosis. No evidence of bowel ?obstruction. ?  ?Vascular/Lymphatic: No pathologically enlarged lymph nodes ?identified. Renal veins appear within normal limits. No abdominal ?aortic aneurysm demonstrated. ?  ?Other:  No ascites. ?  ?  Musculoskeletal: No suspicious bone lesions identified. ?  ?IMPRESSION: ?1. Slight interval increased size of an exophytic enhancing mass at ?the lower pole right kidney, consistent with RCC until proven ?otherwise. No lymphadenopathy or vascular involvement identified. ?2. Bilateral renal angiomyolipomas, largest in the upper pole right ?kidney. Small right renal cysts. ?3. Hepatic cysts including a complex left hepatic cyst. ?4. Hepatosplenomegaly. ?5. Mild colonic diverticulosis. ?  ?  ?Electronically Signed ?  By: Ofilia Neas M.D. ?  On: 10/23/2021 10:16 ? ?MRI was personally reviewed.  Agree with radiologic interpretation.  Was also personally compared to his previous and there is in fact interval growth. ? ?Assessment & Plan:   ? ?Right renal mass  ?- 4 mm interval growth over 6 month period at this point it warrants intervention based AUA guidelines of small renal masses.  ?-Observation is still an option however given his relatively good health and renal mass growth rate, this seems suboptimal moving forward ?- Discussed cryotherapy versus  robotic partial nephrectomy in detail. We reviewed these options in context of the patients current situation as well as the pros and cons of each.   The treatment course of each of the procedures along with risk and benefits o

## 2021-10-25 ENCOUNTER — Other Ambulatory Visit: Payer: Self-pay | Admitting: Urology

## 2021-10-25 ENCOUNTER — Encounter: Payer: Self-pay | Admitting: Urology

## 2021-10-25 ENCOUNTER — Telehealth: Payer: Self-pay

## 2021-10-25 ENCOUNTER — Ambulatory Visit (INDEPENDENT_AMBULATORY_CARE_PROVIDER_SITE_OTHER): Payer: Medicare Other | Admitting: Urology

## 2021-10-25 VITALS — BP 150/100 | HR 67 | Ht 71.0 in | Wt 246.0 lb

## 2021-10-25 DIAGNOSIS — N2889 Other specified disorders of kidney and ureter: Secondary | ICD-10-CM

## 2021-10-25 NOTE — Patient Instructions (Signed)
Minimally Invasive Nephrectomy Minimally invasive nephrectomy is a surgical procedure to remove a kidney. This procedure is done through several small incisions in the abdomen. You may have surgery to: Remove the entire kidney and some surrounding structures (radical nephrectomy). Remove only the damaged or diseased part of the kidney (partial nephrectomy). You may need this surgery if: Your kidney is severely damaged from disease, infection, or cancer. You were born with an abnormal kidney. You are donating a healthy kidney. Tell a health care provider about: Any allergies you have. All medicines you are taking, including vitamins, herbs, eye drops, creams, and over-the-counter medicines. Any problems you or family members have had with anesthetic medicines. Any blood disorders you have. Any surgeries you have had. Any medical conditions you have. Whether you are pregnant or may be pregnant. What are the risks? Generally, this is a safe procedure. However, problems may occur, including: Bleeding. Infection. Damage to other body structures near the kidney. Urine leaking into the abdomen. Pneumonia. A blood clot that forms in the leg and travels to the lung (pulmonary embolism). Allergic reactions to medicines. A bone (usually part of a rib) or more tissue may need to be removed so the surgeon can get to the kidney. If this happens, the minimally invasive procedure may need to be changed to an open procedure. What happens before the procedure? Staying hydrated Follow instructions from your health care provider about hydration, which may include: Up to 2 hours before the procedure - you may continue to drink clear liquids, such as water, clear fruit juice, black coffee, and plain tea.  Eating and drinking restrictions Follow instructions from your health care provider about eating and drinking, which may include: 8 hours before the procedure - stop eating heavy meals or foods, such as  meat, fried foods, or fatty foods. 6 hours before the procedure - stop eating light meals or foods, such as toast or cereal. 6 hours before the procedure - stop drinking milk or drinks that contain milk. 2 hours before the procedure - stop drinking clear liquids. Medicines Ask your health care provider about: Changing or stopping your regular medicines. This is especially important if you are taking diabetes medicines or blood thinners. Taking medicines such as aspirin and ibuprofen. These medicines can thin your blood. Do not take these medicines unless your health care provider tells you to take them. Taking over-the-counter medicines, vitamins, herbs, and supplements. Surgery safety Ask your health care provider: How your surgery site will be marked. What steps will be taken to help prevent infection. These steps may include: Removing hair at the surgery site. Washing skin with a germ-killing soap. Taking antibiotic medicine. General instructions Do not use any products that contain nicotine or tobacco for at least 4 weeks before the procedure. These products include cigarettes, e-cigarettes, and chewing tobacco. If you need help quitting, ask your health care provider. Your health care provider may instruct you to do breathing exercises before the procedure to help prevent pneumonia. Do these exercises as directed. You may need to have your blood drawn (type and cross) in case you need to receive blood (get a transfusion) during the procedure. Plan to have a responsible adult take you home from the hospital or clinic. Plan to have a responsible adult care for you for the time you are told after you leave the hospital or clinic. This is important. What happens during the procedure?     Before surgery begins An IV will be inserted into one   of your veins. You will be given a medicine to make you fall asleep (general anesthetic). Your health care provider may take steps to help blood  flow in your legs. You may have: Tight stockings, also called compression stockings, on your legs. Compression sleeves wrapped around your legs. A machine called a sequential compression device (SCD) will pump air into the sleeves. A small, thin tube will be placed in your bladder (urinary catheter) to drain urine. A tube will be placed through your nose and into your stomach (nasogastric tube) to drain stomach fluids. During surgery A small incision will be made in your abdomen to insert a thin, lighted tube (laparoscope) with a camera attached. This lets your surgeon see your kidney during the procedure. More small incisions will be made to insert other surgical tools. One incision may be slightly larger to remove your kidney. In some cases, the surgeon will do the procedure by controlling tools that are attached to a surgical robot. This is called a robot-assisted nephrectomy. The next steps depend on the type of procedure you are having. If you are having a partial nephrectomy: The blood vessels attached to your kidney will be clamped. Part of your kidney will be removed. The kidney will be closed with stitches (sutures), and the clamp on the blood vessels will be removed. If you are having a radical nephrectomy: All of the blood vessels that attach to your kidney will be closed and cut. Part of the tube that carries urine from your kidney to your bladder (ureter) will be removed. Your kidney will be removed. All of the surgical tools will be removed from your body. A small tube (drain) may be placed near one of the incisions to drain extra fluid from the surgery area. The incisions may be closed with sutures or another type of closure. A bandage (dressing) will be placed over the incision area. The procedure may vary among health care providers and hospitals. What happens after the procedure? Your blood pressure, heart rate, breathing rate, and blood oxygen level will be monitored until  you leave the hospital or clinic. Your nasogastric tube will be removed. You may continue to have: An IV until you can drink fluids on your own. A urinary catheter. Your urine output will be checked. A drain. Your surgical drain will be removed after a few days. Your health care provider will tell you how to care for the drain at home. Pain medicine. This will be given through your IV. Compression stockings or compression sleeves.This helps to prevent blood clots and reduce swelling in your legs. You will be encouraged to: Get out of bed and walk as soon as possible. Do breathing exercises, such as coughing and breathing deeply. This helps prevent pneumonia. Summary Minimally invasive nephrectomy is a surgical procedure to remove a kidney. In some cases, only the damaged or diseased part of the kidney is removed. This procedure is done through several small incisions in the abdomen. Follow instructions from your health care provider about taking medicines and about eating and drinking before the procedure. You will be given a medicine to make you fall asleep (general anesthetic) during the procedure. This information is not intended to replace advice given to you by your health care provider. Make sure you discuss any questions you have with your health care provider. Document Revised: 09/28/2019 Document Reviewed: 09/28/2019 Elsevier Patient Education  2023 Elsevier Inc.  

## 2021-10-25 NOTE — Telephone Encounter (Signed)
IR referral placed for Renal Mass. Message sent to [facility] with [facility]. She will contact patient to schedule ?

## 2021-10-31 ENCOUNTER — Ambulatory Visit
Admission: RE | Admit: 2021-10-31 | Discharge: 2021-10-31 | Disposition: A | Discharge status: Home / Self Care | Payer: Medicare Other | Source: Ambulatory Visit | Attending: Urology | Admitting: Urology

## 2021-10-31 DIAGNOSIS — N2889 Other specified disorders of kidney and ureter: Secondary | ICD-10-CM

## 2021-10-31 HISTORY — PX: IR RADIOLOGIST EVAL & MGMT: IMG5224

## 2021-10-31 NOTE — Consult Note (Signed)
? ? ?Chief Complaint: ?Patient was seen in consultation today for right renal AML and enhancing mass at the request of [facility] ? ?Referring Physician(s): ?Brandon,Ashley ? ?History of Present Illness: ?Alan Campbell is a 71 y.o. male Referred at the kind request of Dr. Hollice Espy.  Alan Campbell has a history of bilateral renal lesions.  Lesions in both kidneys demonstrate macroscopic fat consistent with angiomyolipomas.  In the left kidney, a small subcentimeter angiomyolipoma has remained stable dating all the way back to 2009.  However, in the right kidney there are 2 lesions which are new and demonstrate enlargement over the past 6 months between MRIs obtained in November 2022 and May 2023. ? ?In the upper pole, there is a macroscopic fat-containing angiomyolipoma which previously measured 3.6 cm and has increased to 4.4 cm. ? ?In the lower pole, there is an avidly enhancing solid lesion without macroscopic fat which previously measured 2 cm and has enlarged to 2.4 cm. ? ?Alan Campbell remains clinically asymptomatic.  He denies flank pain, hematuria, unintentional weight loss, nausea, vomiting or other systemic symptoms.  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  He is enjoying being back at work and is interested in a treatment modality that will have minimal downtime.  He is accompanied by his wife, Alan Campbell. ? ?Past Medical History:  ?Diagnosis Date  ? Allergy   ? Anemia, iron deficiency   ? 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 Cataract   ? Dysplastic nevus 04/24/2010  ? Left low back. Moderate to severe atypia, edge involved.  ? Hyperlipidemia   ? Hypertension   ? Low back pain   ? Migraine   ? Obesity   ? OP (osteoporosis)   ? Thyroid disease   ? Vitamin D deficiency   ? 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 ? ?Past Surgical History:  ?Procedure Laterality Date  ? BACK SURGERY    ? COLONOSCOPY WITH PROPOFOL N/A 08/14/2019  ? Procedure:  COLONOSCOPY WITH PROPOFOL;  Surgeon: Lin Landsman, MD;  Location: [facility] ENDOSCOPY;  Service: Gastroenterology;  Laterality: N/A;  PRIORITY 4  ? EYE SURGERY  2016  ? right cataract removal  ? LAMINECTOMY  1985  ? L-5  ? [place]  ? ? ?Allergies: ?Patient has no known allergies. ? ?Medications: ?Prior to Admission medications   ?Medication Sig Start Date End Date Taking? Authorizing Provider  ?Cholecalciferol (VITAMIN D) 2000 units CAPS Take 1 capsule (2,000 Units total) by mouth daily. 07/07/15   [name], MD  ?Cinnamon 500 MG TABS Take 2,000 mg by mouth.     [provider]  ?CRANBERRY PO Take 4,200 mg by mouth daily.    [provider]  ?febuxostat (ULORIC) 40 MG tablet Take 1 tablet (40 mg total) by mouth daily. 08/01/21   [name], MD  ?furosemide (LASIX) 20 MG tablet Take 1 tablet (20 mg total) by mouth 2 (two) times daily. 03/02/21 03/02/22  [name], MD  ?levothyroxine (SYNTHROID) 150 MCG tablet TAKE ONE TABLET BY MOUTH DAILY 08/23/21   [name], MD  ?Multiple Vitamins-Minerals (CENTRUM SILVER 50+MEN) TABS Take 1 tablet by mouth daily.    [provider]  ?OMEGA-3 FATTY ACIDS PO Take 1 tablet by mouth daily. 01/06/07   [provider]  ?potassium chloride SA (KLOR-CON) 20 MEQ tablet Take 1 tablet (20 mEq total) by mouth 2 (two) times daily. Take only when you  take furosemide 03/02/21   [name], MD  ?predniSONE (DELTASONE) 20 MG tablet Take 1 tablet (20 mg total) by mouth 2 (two) times daily with a meal. For 3-5 days prn 08/01/21   [name], MD  ?rosuvastatin (CRESTOR) 40 MG tablet Take 1 tablet (40 mg total) by mouth daily. 06/02/21   [name], MD  ?Testosterone 20.25 MG/ACT (1.62%) GEL Apply 2 each topically daily. 2 pumps daily 11/30/20   [name], MD  ?valsartan (DIOVAN) 320 MG tablet TAKE ONE TABLET BY MOUTH DAILY 10/05/21   [name], MD  ?verapamil (CALAN-SR) 240 MG CR tablet TAKE ONE TABLET BY MOUTH  EVERY NIGHT AT BEDTIME 10/05/21   [name], MD  ?  ? ?Family History  ?Problem Relation Age of Onset  ? Heart disease Father   ? Early death Father   ? Hypothyroidism Sister   ? Thyroid disease Sister   ? Hypothyroidism Sister   ? Migraines Sister   ? Prostate cancer Neg Hx   ? Bladder Cancer Neg Hx   ? Kidney cancer Neg Hx   ? ? ?Social History  ? ?Socioeconomic History  ? Marital status: Married  ?  Spouse name: donna  ? Number of children: 0  ? Years of education: Not on file  ? Highest education level: Not on file  ?Occupational History  ? Occupation: Investment banker, corporate   ?Tobacco Use  ? Smoking status: Former  ?  Packs/day: 0.50  ?  Years: 5.00  ?  Pack years: 2.50  ?  Types: Cigarettes  ?  Quit date: 10/03/1970  ?  Years since quitting: 51.1  ? Smokeless tobacco: Never  ? Tobacco comments:  ?  Quit 1972  ?Vaping Use  ? Vaping Use: Never used  ?Substance and Sexual Activity  ? Alcohol use: Yes  ?  Alcohol/week: 4.0 standard drinks  ?  Types: 4 Standard drinks or equivalent per week  ? Drug use: No  ? Sexual activity: Yes  ?  Partners: Female  ?Other Topics Concern  ? Not on file  ?Social History Narrative  ? Married since 1974, no children but they helped raise Tretha Sciara ( wife's niece), also helped raise their God-daughter Lyndee Leo  ? He is planning on retiring January 2021  ? ?Social Determinants of Health  ? ?Financial Resource Strain: Low Risk   ? Difficulty of Paying Living Expenses: Not hard at all  ?Food Insecurity: No Food Insecurity  ? Worried About Charity fundraiser in the Last Year: Never true  ? Ran Out of Food in the Last Year: Never true  ?Transportation Needs: No Transportation Needs  ? Lack of Transportation (Medical): No  ? Lack of Transportation (Non-Medical): No  ?Physical Activity: Inactive  ? Days of Exercise per Week: 0 days  ? Minutes of Exercise per Session: 0 min  ?Stress: No Stress Concern Present  ? Feeling of Stress : Not at all  ?Social Connections: Socially Integrated  ?  Frequency of Communication with Friends and Family: More than three times a week  ? Frequency of Social Gatherings with Friends and Family: Three times a week  ? Attends Religious Services: More than 4 times per year  ? Active Member of Clubs or Organizations: Yes  ? Attends Archivist Meetings: More than 4 times per year  ? Marital Status: Married  ? ? ?ECOG Status: ?0 - Asymptomatic ? ?Review of Systems: A 12 point ROS discussed and pertinent positives are indicated in the HPI  above.  All other systems are negative. ? ?Review of Systems ? ?Vital Signs: ?There were no vitals taken for this visit. ? ?Physical Exam ?Constitutional:   ?   Appearance: Normal appearance.  ?HENT:  ?   Head: Normocephalic and atraumatic.  ?Eyes:  ?   General: No scleral icterus. ?Cardiovascular:  ?   Rate and Rhythm: Normal rate.  ?Pulmonary:  ?   Effort: Pulmonary effort is normal.  ?Abdominal:  ?   General: There is no distension.  ?   Tenderness: There is no abdominal tenderness.  ?Skin: ?   General: Skin is warm and dry.  ?Neurological:  ?   Mental Status: He is alert and oriented to person, place, and time.  ?Psychiatric:     ?   Mood and Affect: Mood normal.     ?   Behavior: Behavior normal.  ? ? ? ? ?Imaging: ?MR Abdomen W Wo Contrast ? ?Result Date: 10/23/2021 ?CLINICAL DATA:  Renal mass follow-up EXAM: MRI ABDOMEN WITHOUT AND WITH CONTRAST TECHNIQUE: Multiplanar multisequence MR imaging of the abdomen was performed both before and after the administration of intravenous contrast. CONTRAST:  26m GADAVIST GADOBUTROL 1 MMOL/ML IV SOLN COMPARISON:  MRI abdomen 04/19/2021 FINDINGS: Lower chest: No acute findings. Hepatobiliary: Liver is enlarged measuring 22 cm in length. Interval decreased size of a now complex hemorrhagic and partially septated cyst in the left hepatic lobe measuring 3.7 x 3.4 cm. A few additional subcentimeter hepatic cysts identified scattered throughout the liver. Gallbladder appears normal. No biliary  ductal dilatation. Pancreas: No mass, inflammatory changes, or other parenchymal abnormality identified. Spleen:  Enlarged measuring 17.2 cm in length. Adrenals/Urinary Tract: Adrenal glands are normal. A few simp

## 2021-11-02 ENCOUNTER — Other Ambulatory Visit (HOSPITAL_COMMUNITY): Payer: Self-pay | Admitting: Interventional Radiology

## 2021-11-02 DIAGNOSIS — C649 Malignant neoplasm of unspecified kidney, except renal pelvis: Secondary | ICD-10-CM

## 2021-11-15 ENCOUNTER — Other Ambulatory Visit: Payer: Self-pay | Admitting: Family Medicine

## 2021-11-15 DIAGNOSIS — E039 Hypothyroidism, unspecified: Secondary | ICD-10-CM

## 2021-11-16 ENCOUNTER — Other Ambulatory Visit: Payer: Self-pay | Admitting: Student

## 2021-11-16 DIAGNOSIS — N2889 Other specified disorders of kidney and ureter: Secondary | ICD-10-CM

## 2021-11-16 NOTE — Progress Notes (Signed)
Spoke with Amy to request orders in epic from Psychologist, sport and exercise.

## 2021-11-20 ENCOUNTER — Telehealth: Payer: Self-pay | Admitting: Urology

## 2021-11-20 NOTE — Telephone Encounter (Signed)
Pt's wife Butch Penny called office to let Dr Erlene Quan know that Dr Laurence Ferrari will be his microwave ablasion 6/14th.

## 2021-11-22 NOTE — Patient Instructions (Addendum)
DUE TO COVID-19 ONLY TWO VISITORS  (aged 72 and older)  ARE ALLOWED TO COME WITH YOU AND STAY IN THE WAITING ROOM ONLY DURING PRE OP AND PROCEDURE.   **NO VISITORS ARE ALLOWED IN THE SHORT STAY AREA OR RECOVERY ROOM!!**  IF YOU WILL BE ADMITTED INTO THE HOSPITAL YOU ARE ALLOWED ONLY FOUR SUPPORT PEOPLE DURING VISITATION HOURS ONLY (7 AM -8PM)   The support person(s) must pass our screening, gel in and out, and wear a mask at all times, including in the patient's room. Patients must also wear a mask when staff or their support person are in the room. Visitors GUEST BADGE MUST BE WORN VISIBLY  One adult visitor may remain with you overnight and MUST be in the room by 8 P.M.     Your procedure is scheduled on: 11/29/21   Report to [facility] Main Entrance    Report to admitting at 9:00 AM   Call this number if you have problems the morning of surgery [phone]   Do not eat food or drink liquids :After Midnight.  Water Black Coffee (sugar ok, NO MILK/CREAM OR CREAMERS)  Tea (sugar ok, NO MILK/CREAM OR CREAMERS) regular and decaf                             Plain Jell-O (NO RED)                                           Fruit ices (not with fruit pulp, NO RED)                                     Popsicles (NO RED)                                                                  Juice: apple, WHITE grape, WHITE cranberry Sports drinks like Gatorade (NO RED) Clear broth(vegetable,chicken,beef  FOLLOW BOWEL PREP AND ANY ADDITIONAL PRE OP INSTRUCTIONS YOU RECEIVED FROM YOUR SURGEON'S OFFICE!!!     Oral Hygiene is also important to reduce your risk of infection.                                    Remember - BRUSH YOUR TEETH THE MORNING OF SURGERY WITH YOUR REGULAR TOOTHPASTE   Take these medicines the morning of surgery with A SIP OF WATER: Levothyroxine, Rosuvastatin                               You may not have any metal on your body including jewelry, and body piercing              Do not wear lotions, powders, cologne, or deodorant              Men may shave face and neck.   Do not bring valuables to the hospital. [facility] IS NOT  RESPONSIBLE   FOR VALUABLES.   Bring small overnight bag day of surgery.   DO NOT [place]. PHARMACY WILL DISPENSE MEDICATIONS LISTED ON YOUR MEDICATION LIST TO YOU DURING YOUR ADMISSION [place]!   Special Instructions: Bring a copy of your healthcare power of attorney and living will documents         the day of surgery if you haven't scanned them before.              Please read over the following fact sheets you were given: IF YOU HAVE QUESTIONS ABOUT YOUR PRE-OP INSTRUCTIONS PLEASE CALL [place] - Preparing for Surgery Before surgery, you can play an important role.  Because skin is not sterile, your skin needs to be as free of germs as possible.  You can reduce the number of germs on your skin by washing with CHG (chlorahexidine gluconate) soap before surgery.  CHG is an antiseptic cleaner which kills germs and bonds with the skin to continue killing germs even after washing. Please DO NOT use if you have an allergy to CHG or antibacterial soaps.  If your skin becomes reddened/irritated stop using the CHG and inform your nurse when you arrive at Short Stay. Do not shave (including legs and underarms) for at least 48 hours prior to the first CHG shower.  You may shave your face/neck.  Please follow these instructions carefully:  1.  Shower with CHG Soap the night before surgery and the  morning of surgery.  2.  If you choose to wash your hair, wash your hair first as usual with your normal  shampoo.  3.  After you shampoo, rinse your hair and body thoroughly to remove the shampoo.                             4.  Use CHG as you would any other liquid soap.  You can apply chg directly to the skin and wash.  Gently with a scrungie or clean  washcloth.  5.  Apply the CHG Soap to your body ONLY FROM THE NECK DOWN.   Do   not use on face/ open                           Wound or open sores. Avoid contact with eyes, ears mouth and   genitals (private parts).                       Wash face,  Genitals (private parts) with your normal soap.             6.  Wash thoroughly, paying special attention to the area where your    surgery  will be performed.  7.  Thoroughly rinse your body with warm water from the neck down.  8.  DO NOT shower/wash with your normal soap after using and rinsing off the CHG Soap.                9.  Pat yourself dry with a clean towel.            10.  Wear clean pajamas.            11.  Place clean sheets on your bed the night of your first shower and do not  sleep with pets.  Day of Surgery : Do not apply any lotions/deodorants the morning of surgery.  Please wear clean clothes to the hospital/surgery center.  FAILURE TO FOLLOW THESE INSTRUCTIONS MAY RESULT IN THE CANCELLATION OF YOUR SURGERY  PATIENT SIGNATURE_________________________________  NURSE SIGNATURE__________________________________  ________________________________________________________________________    WHAT IS A BLOOD TRANSFUSION? Blood Transfusion Information  A transfusion is the replacement of blood or some of its parts. Blood is made up of multiple cells which provide different functions. Red blood cells carry oxygen and are used for blood loss replacement. White blood cells fight against infection. Platelets control bleeding. Plasma helps clot blood. Other blood products are available for specialized needs, such as hemophilia or other clotting disorders. BEFORE THE TRANSFUSION  Who gives blood for transfusions?  Healthy volunteers who are fully evaluated to make sure their blood is safe. This is blood bank blood. Transfusion therapy is the safest it has ever been in the practice of medicine. Before blood is taken from a donor, a  complete history is taken to make sure that person has no history of diseases nor engages in risky social behavior (examples are intravenous drug use or sexual activity with multiple partners). The donor's travel history is screened to minimize risk of transmitting infections, such as malaria. The donated blood is tested for signs of infectious diseases, such as HIV and hepatitis. The blood is then tested to be sure it is compatible with you in order to minimize the chance of a transfusion reaction. If you or a relative donates blood, this is often done in anticipation of surgery and is not appropriate for emergency situations. It takes many days to process the donated blood. RISKS AND COMPLICATIONS Although transfusion therapy is very safe and saves many lives, the main dangers of transfusion include:  Getting an infectious disease. Developing a transfusion reaction. This is an allergic reaction to something in the blood you were given. Every precaution is taken to prevent this. The decision to have a blood transfusion has been considered carefully by your caregiver before blood is given. Blood is not given unless the benefits outweigh the risks. AFTER THE TRANSFUSION Right after receiving a blood transfusion, you will usually feel much better and more energetic. This is especially true if your red blood cells have gotten low (anemic). The transfusion raises the level of the red blood cells which carry oxygen, and this usually causes an energy increase. The nurse administering the transfusion will monitor you carefully for complications. HOME CARE INSTRUCTIONS  No special instructions are needed after a transfusion. You may find your energy is better. Speak with your caregiver about any limitations on activity for underlying diseases you may have. SEEK MEDICAL CARE IF:  Your condition is not improving after your transfusion. You develop redness or irritation at the intravenous (IV) site. SEEK  IMMEDIATE MEDICAL CARE IF:  Any of the following symptoms occur over the next 12 hours: Shaking chills. You have a temperature by mouth above 102 F (38.9 C), not controlled by medicine. Chest, back, or muscle pain. People around you feel you are not acting correctly or are confused. Shortness of breath or difficulty breathing. Dizziness and fainting. You get a rash or develop hives. You have a decrease in urine output. Your urine turns a dark color or changes to pink, red, or brown. Any of the following symptoms occur over the next 10 days: You have a temperature by mouth above 102 F (38.9 C), not controlled by medicine. Shortness of breath. Weakness after normal  activity. The white part of the eye turns yellow (jaundice). You have a decrease in the amount of urine or are urinating less often. Your urine turns a dark color or changes to pink, red, or brown. Document Released: 06/01/2000 Document Revised: 08/27/2011 Document Reviewed: 01/19/2008 [facility] Patient Information 2014 [place], [place].  _______________________________________________________________________

## 2021-11-22 NOTE — Progress Notes (Addendum)
COVID Vaccine Completed: yes x3  Date of COVID positive in last 90 days: no  PCP - [name], MD Cardiologist - Dr. Clayborn Bigness   Chest x-ray - n/a EKG - 11/23/21 Epic/chart Stress Test - 07/30/17 [facility] ECHO - 07/26/17 [facility] Cardiac Cath - n/a Pacemaker/ICD device last checked: n/a Spinal Cord Stimulator: n/a  Bowel Prep - no  Sleep Study - no per pt CPAP -   Fasting Blood Sugar - no per pt, had metabolic syndrome, no DM Checks Blood Sugar _____ times a day  Blood Thinner Instructions: Aspirin Instructions: ASA 81, 3 days before surgery Last Dose:  Activity level: Can go up a flight of stairs and perform activities of daily living without stopping and without symptoms of chest pain or shortness of breath.    Anesthesia review: HTN, atherosclerosis, OSA, heart murmur  Patient denies shortness of breath, fever, cough and chest pain at PAT appointment  Patient verbalized understanding of instructions that were given to them at the PAT appointment. Patient was also instructed that they will need to review over the PAT instructions again at home before surgery.

## 2021-11-23 ENCOUNTER — Other Ambulatory Visit: Payer: Self-pay | Admitting: Family Medicine

## 2021-11-23 ENCOUNTER — Encounter (HOSPITAL_COMMUNITY): Payer: Self-pay

## 2021-11-23 ENCOUNTER — Encounter (HOSPITAL_COMMUNITY)
Admission: RE | Admit: 2021-11-23 | Discharge: 2021-11-23 | Disposition: A | Discharge status: Home / Self Care | Payer: Medicare Other | Source: Ambulatory Visit | Attending: Interventional Radiology | Admitting: Interventional Radiology

## 2021-11-23 VITALS — BP 162/93 | HR 57 | Temp 98.0°F | Resp 16 | Ht 70.5 in | Wt 231.6 lb

## 2021-11-23 DIAGNOSIS — N2889 Other specified disorders of kidney and ureter: Secondary | ICD-10-CM | POA: Diagnosis not present

## 2021-11-23 DIAGNOSIS — I10 Essential (primary) hypertension: Secondary | ICD-10-CM | POA: Diagnosis not present

## 2021-11-23 DIAGNOSIS — E119 Type 2 diabetes mellitus without complications: Secondary | ICD-10-CM | POA: Insufficient documentation

## 2021-11-23 DIAGNOSIS — Z01818 Encounter for other preprocedural examination: Secondary | ICD-10-CM | POA: Diagnosis present

## 2021-11-23 DIAGNOSIS — E785 Hyperlipidemia, unspecified: Secondary | ICD-10-CM

## 2021-11-23 HISTORY — DX: Hypothyroidism, unspecified: E03.9

## 2021-11-23 HISTORY — DX: Obstructive sleep apnea (adult) (pediatric): G47.33

## 2021-11-23 LAB — CBC WITH DIFFERENTIAL/PLATELET
Abs Immature Granulocytes: 0.02 10*3/uL (ref 0.00–0.07)
Basophils Absolute: 0 10*3/uL (ref 0.0–0.1)
Basophils Relative: 1 %
Eosinophils Absolute: 0.2 10*3/uL (ref 0.0–0.5)
Eosinophils Relative: 3 %
HCT: 37.7 % — ABNORMAL LOW (ref 39.0–52.0)
Hemoglobin: 13 g/dL (ref 13.0–17.0)
Immature Granulocytes: 0 %
Lymphocytes Relative: 22 %
Lymphs Abs: 1.4 10*3/uL (ref 0.7–4.0)
MCH: 31.2 pg (ref 26.0–34.0)
MCHC: 34.5 g/dL (ref 30.0–36.0)
MCV: 90.4 fL (ref 80.0–100.0)
Monocytes Absolute: 0.4 10*3/uL (ref 0.1–1.0)
Monocytes Relative: 6 %
Neutro Abs: 4.3 10*3/uL (ref 1.7–7.7)
Neutrophils Relative %: 68 %
Platelets: 215 10*3/uL (ref 150–400)
RBC: 4.17 MIL/uL — ABNORMAL LOW (ref 4.22–5.81)
RDW: 14.6 % (ref 11.5–15.5)
WBC: 6.2 10*3/uL (ref 4.0–10.5)
nRBC: 0 % (ref 0.0–0.2)

## 2021-11-23 LAB — PROTIME-INR
INR: 1 (ref 0.8–1.2)
Prothrombin Time: 12.6 seconds (ref 11.4–15.2)

## 2021-11-23 LAB — BASIC METABOLIC PANEL
Anion gap: 6 (ref 5–15)
BUN: 25 mg/dL — ABNORMAL HIGH (ref 8–23)
CO2: 27 mmol/L (ref 22–32)
Calcium: 9.4 mg/dL (ref 8.9–10.3)
Chloride: 110 mmol/L (ref 98–111)
Creatinine, Ser: 0.97 mg/dL (ref 0.61–1.24)
GFR, Estimated: >60 mL/min (ref >60)
Glucose, Bld: 116 mg/dL — ABNORMAL HIGH (ref 70–99)
Potassium: 3.9 mmol/L (ref 3.5–5.1)
Sodium: 143 mmol/L (ref 135–145)

## 2021-11-23 LAB — HEMOGLOBIN A1C
Hgb A1c MFr Bld: 4.5 % — ABNORMAL LOW (ref 4.8–5.6)
Mean Plasma Glucose: 82.45 mg/dL

## 2021-11-24 ENCOUNTER — Encounter (HOSPITAL_COMMUNITY): Payer: Self-pay

## 2021-11-24 NOTE — Progress Notes (Signed)
Anesthesia Chart Review:   Case: [id number] Date/Time: 11/29/21 1100   Procedure: CT MICROWAVE ABLATION   Anesthesia type: General   Pre-op diagnosis: RENAL CELL CARCINOMA   [facility]   Surgeons: Criselda Peaches, MD       DISCUSSION: Pt is 71 years old with hx HTN, heart murmur (unspecified; cardiology note 07/03/21 documents "no murmur"), OSA, renal cell carcinoma, anemia  VS: BP (!) 162/93   Pulse (!) 57   Temp 36.7 C (Oral)   Resp 16   Ht 5' 10.5" (1.791 m)   Wt 105.1 kg   SpO2 98%   BMI 32.76 kg/m    PROVIDERS: - PCP is [name], MD - Cardiologist is Lujean Amel, MD. Last office visit 07/03/21 (notes in care everywhere)    LABS: Labs reviewed: Acceptable for surgery. (all labs ordered are listed, but only abnormal results are displayed)  Labs Reviewed  CBC WITH DIFFERENTIAL/PLATELET - Abnormal; Notable for the following components:      Result Value   RBC 4.17 (*)    HCT 37.7 (*)    All other components within normal limits  BASIC METABOLIC PANEL - Abnormal; Notable for the following components:   Glucose, Bld 116 (*)    BUN 25 (*)    All other components within normal limits  HEMOGLOBIN A1C - Abnormal; Notable for the following components:   Hgb A1c MFr Bld 4.5 (*)    All other components within normal limits  PROTIME-INR  TYPE AND SCREEN    IMAGES: MR abdomen 10/21/21:  1. Slight interval increased size of an exophytic enhancing mass at the lower pole right kidney, consistent with RCC until proven otherwise. No lymphadenopathy or vascular involvement identified. 2. 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 Small right renal cysts. 3. Hepatic cysts including a complex left hepatic cyst. 4. Hepatosplenomegaly. 5. Mild colonic diverticulosis.   EKG 11/23/21: Sinus bradycardia. RBBB. LAFB. Bifascicular block. Minimal voltage criteria for LVH, may be normal variant ( R in aVL ). 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 Moderate to severe atypia, edge involved.   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    Past Surgical History:  Procedure Laterality Date   BACK SURGERY     COLONOSCOPY WITH PROPOFOL N/A 08/14/2019   Procedure: COLONOSCOPY WITH PROPOFOL;  Surgeon: Lin Landsman, MD;  Location: [place];  Service: Gastroenterology;  Laterality: N/A;  PRIORITY 4   EYE SURGERY  2016   right cataract removal   IR RADIOLOGIST EVAL & MGMT  10/31/2021   LAMINECTOMY  1985   L-5   SPINE SURGERY  1985    MEDICATIONS:  Cholecalciferol (VITAMIN D) 2000 units CAPS   Cinnamon 500 MG TABS   CRANBERRY PO   febuxostat (ULORIC) 40 MG tablet   furosemide (LASIX) 20 MG tablet   levothyroxine (SYNTHROID) 150 MCG tablet   Multiple Vitamins-Minerals (CENTRUM SILVER 50+MEN) TABS   OMEGA-3 FATTY ACIDS PO   potassium chloride SA (KLOR-CON) 20 MEQ tablet   predniSONE (DELTASONE) 20 MG tablet   rosuvastatin (CRESTOR) 40 MG tablet   Testosterone 20.25 MG/ACT (1.62%) GEL   valsartan (DIOVAN) 320 MG tablet   verapamil (CALAN-SR) 240 MG CR tablet   No current  facility-administered medications for this encounter.    If no changes, I anticipate pt can proceed with surgery as scheduled.   Alan Cass, PhD, FNP-BC [facility] Stay [facility]/Anesthesiology Phone: [phone] 11/24/2021 10:54 AM

## 2021-11-24 NOTE — Anesthesia Preprocedure Evaluation (Addendum)
Anesthesia Evaluation  Patient identified by [name], date of birth, ID band Patient awake    Reviewed: Allergy & Precautions, NPO status , Patient's Chart, lab work & pertinent test results  Airway Mallampati: II  TM Distance: >3 FB Neck ROM: Full    Dental no notable dental hx. (+) Teeth Intact, Dental Advisory Given   Pulmonary sleep apnea , former smoker,    Pulmonary exam normal breath sounds clear to auscultation       Cardiovascular hypertension, Pt. on medications Normal cardiovascular exam Rhythm:Regular Rate:Normal  2019 Echo Normal LV systolic function with an estimated EF =>55%   Neuro/Psych    GI/Hepatic negative GI ROS, Neg liver ROS,   Endo/Other    Renal/GU Renal diseaseRenal Cell CA Lab Results      Component                Value               Date                      CREATININE               0.97                11/23/2021                K                        3.9                 11/23/2021                     Musculoskeletal   Abdominal (+) + obese (BMI 32.8),   Peds  Hematology  (+) Blood dyscrasia, anemia , Lab Results      Component                Value               Date                      WBC                      6.2                 11/23/2021                HGB                      13.0                11/23/2021                HCT                      37.7 (L)            11/23/2021                    Anesthesia Other Findings NKA  Reproductive/Obstetrics                           Anesthesia Physical Anesthesia Plan  ASA: 3  Anesthesia Plan: General   Post-op Pain Management:    Induction: Intravenous  PONV Risk Score and  Plan: 3 and Treatment may vary due to age or medical condition, Midazolam and Ondansetron  Airway Management Planned: Oral ETT  Additional Equipment: None  Intra-op Plan:   Post-operative Plan: Extubation in OR  Informed Consent:      Dental advisory given  Plan Discussed with:   Anesthesia Plan Comments: (See APP note by Durel Salts, FNP )       Anesthesia Quick Evaluation

## 2021-11-28 ENCOUNTER — Other Ambulatory Visit: Payer: Self-pay | Admitting: Radiology

## 2021-11-28 DIAGNOSIS — N2889 Other specified disorders of kidney and ureter: Secondary | ICD-10-CM

## 2021-11-29 ENCOUNTER — Encounter (HOSPITAL_COMMUNITY): Payer: Self-pay | Admitting: Interventional Radiology

## 2021-11-29 ENCOUNTER — Other Ambulatory Visit: Payer: Self-pay

## 2021-11-29 ENCOUNTER — Ambulatory Visit (HOSPITAL_BASED_OUTPATIENT_CLINIC_OR_DEPARTMENT_OTHER): Payer: Medicare Other | Admitting: Certified Registered Nurse Anesthetist

## 2021-11-29 ENCOUNTER — Encounter (HOSPITAL_COMMUNITY)
Admission: RE | Disposition: A | Discharge status: Home / Self Care | Payer: Self-pay | Source: Ambulatory Visit | Attending: Interventional Radiology

## 2021-11-29 ENCOUNTER — Ambulatory Visit (HOSPITAL_COMMUNITY): Payer: Medicare Other | Admitting: Emergency Medicine

## 2021-11-29 ENCOUNTER — Observation Stay (HOSPITAL_COMMUNITY)
Admission: RE | Admit: 2021-11-29 | Discharge: 2021-11-30 | Disposition: A | Discharge status: Home / Self Care | Payer: Medicare Other | Source: Ambulatory Visit | Attending: Interventional Radiology | Admitting: Interventional Radiology

## 2021-11-29 ENCOUNTER — Encounter (HOSPITAL_COMMUNITY): Payer: Self-pay

## 2021-11-29 ENCOUNTER — Observation Stay (HOSPITAL_COMMUNITY)
Admission: RE | Admit: 2021-11-29 | Discharge: 2021-11-29 | Disposition: A | Discharge status: Home / Self Care | Payer: Medicare Other | Source: Ambulatory Visit | Attending: Interventional Radiology | Admitting: Interventional Radiology

## 2021-11-29 DIAGNOSIS — Z7982 Long term (current) use of aspirin: Secondary | ICD-10-CM | POA: Insufficient documentation

## 2021-11-29 DIAGNOSIS — Z6832 Body mass index (BMI) 32.0-32.9, adult: Secondary | ICD-10-CM | POA: Insufficient documentation

## 2021-11-29 DIAGNOSIS — G4730 Sleep apnea, unspecified: Secondary | ICD-10-CM | POA: Diagnosis not present

## 2021-11-29 DIAGNOSIS — Z87891 Personal history of nicotine dependence: Secondary | ICD-10-CM

## 2021-11-29 DIAGNOSIS — I10 Essential (primary) hypertension: Secondary | ICD-10-CM | POA: Diagnosis not present

## 2021-11-29 DIAGNOSIS — E669 Obesity, unspecified: Secondary | ICD-10-CM | POA: Insufficient documentation

## 2021-11-29 DIAGNOSIS — M810 Age-related osteoporosis without current pathological fracture: Secondary | ICD-10-CM | POA: Diagnosis not present

## 2021-11-29 DIAGNOSIS — E039 Hypothyroidism, unspecified: Secondary | ICD-10-CM | POA: Diagnosis not present

## 2021-11-29 DIAGNOSIS — C649 Malignant neoplasm of unspecified kidney, except renal pelvis: Secondary | ICD-10-CM

## 2021-11-29 DIAGNOSIS — D1771 Benign lipomatous neoplasm of kidney: Principal | ICD-10-CM | POA: Insufficient documentation

## 2021-11-29 DIAGNOSIS — D649 Anemia, unspecified: Secondary | ICD-10-CM | POA: Diagnosis not present

## 2021-11-29 DIAGNOSIS — N2889 Other specified disorders of kidney and ureter: Secondary | ICD-10-CM

## 2021-11-29 DIAGNOSIS — E785 Hyperlipidemia, unspecified: Secondary | ICD-10-CM | POA: Insufficient documentation

## 2021-11-29 DIAGNOSIS — Z79899 Other long term (current) drug therapy: Secondary | ICD-10-CM | POA: Insufficient documentation

## 2021-11-29 HISTORY — PX: RADIOLOGY WITH ANESTHESIA: SHX6223

## 2021-11-29 LAB — TYPE AND SCREEN
ABO/RH(D): A POS
Antibody Screen: NEGATIVE

## 2021-11-29 LAB — ABO/RH: ABO/RH(D): A POS

## 2021-11-29 IMAGING — CT CT BIOPSY
4 of 13 series · 8 of 38 positions shown, 9 images · non-contrast
Comparison: None Available.

INDICATION: 70-year-old male with a 4.4 cm angiomyolipoma arising exophytic from
the upper pole of the right kidney, and a 2.4 cm enhancing solid
mass exophytic from the lower pole of the right kidney. He presents
for CT-guided ablation of the lower pole lesion and percutaneous
thermal ablation with microwave of both the lower and upper pole
lesions.

EXAM:
1. CT-guided biopsy right lower pole renal mass
2. Microwave ablation right lower pole renal mass (presumed renal
cell carcinoma)
3. Microwave ablation right upper pole renal angiomyolipoma (greater
than 4 cm and at risk for spontaneous hemorrhage)
TECHNIQUE: Informed written consent was obtained from the patient after a
thorough discussion of the procedural risks, benefits and
alternatives. All questions were addressed. Maximal Sterile Barrier
Technique was utilized including caps, mask, sterile gowns, sterile
gloves, sterile drape, hand hygiene and skin antiseptic. A timeout
was performed prior to the initiation of the procedure.

[Series 2: i-spiral 2.0 bf37 · axial · 0.98mm/px · z∈[-124,-110]mm · 2 of 110 slices shown, 3 images (1 of 4)]
[im 48/110  mediastinal]
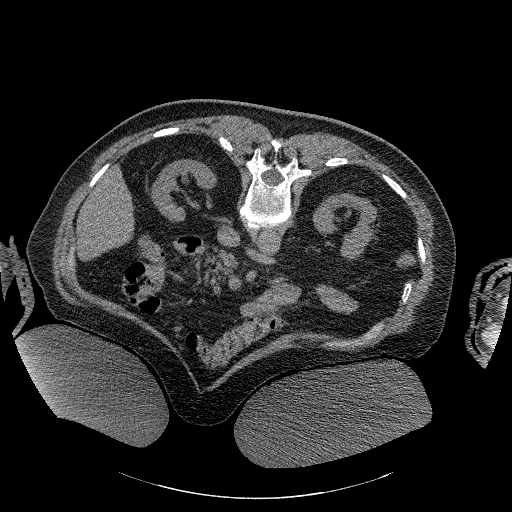
[im 48/110  lung]
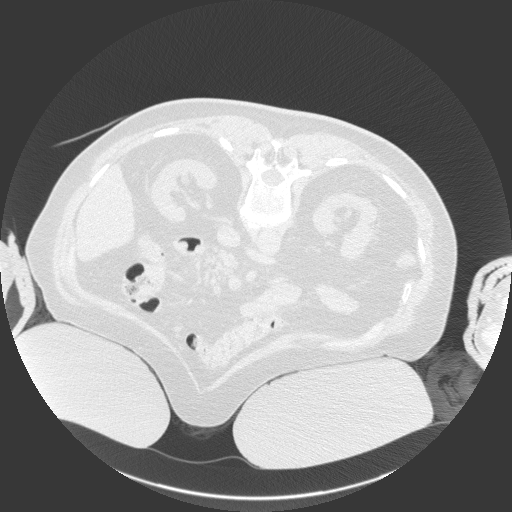
[im 55/110  lung]
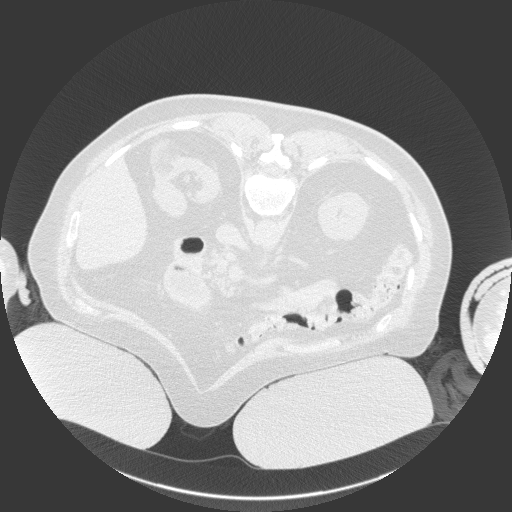

[Series 15: i-spiral 2.0 bf37 · axial · 0.98mm/px · z∈[-156,-126]mm · 2 of 78 slices shown (2 of 4)]
[im 39/78  lung]
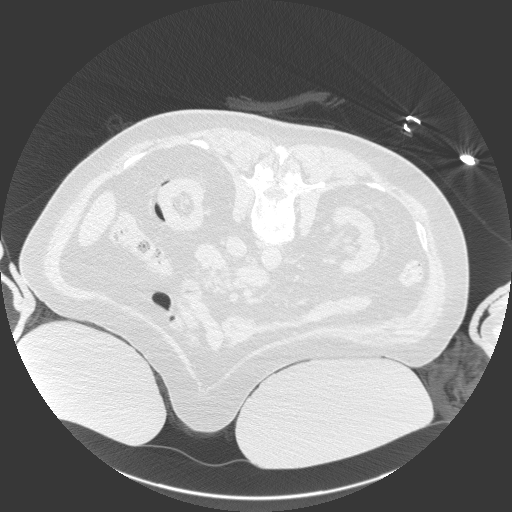
[im 54/78  lung]
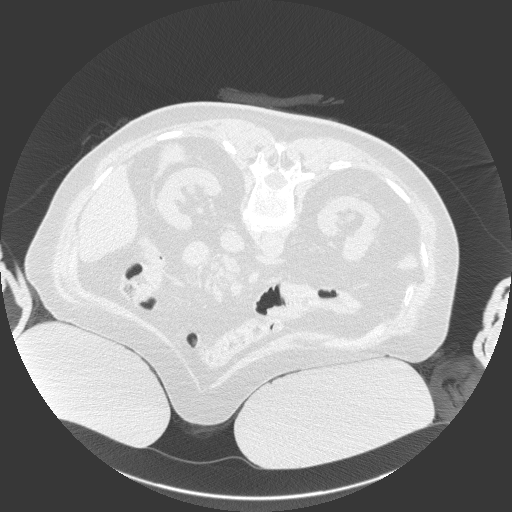

[Series 16: i-spiral 2.0 bf37 · axial · 0.98mm/px · z∈[-156,-126]mm · 2 of 78 slices shown (3 of 4)]
[im 39/78  lung]
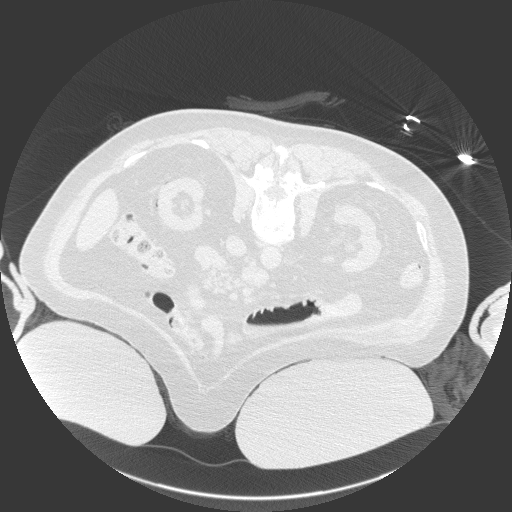
[im 54/78  lung]
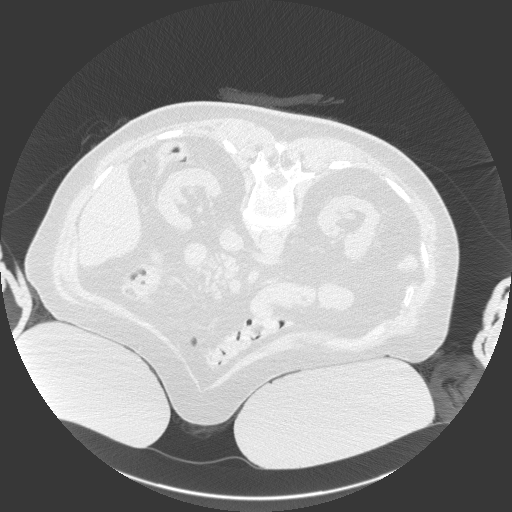

[Series 17: i-spiral 2.0 bf37 · axial · 0.98mm/px · z∈[-156,-126]mm · 2 of 78 slices shown (4 of 4)]
[im 39/78  lung]
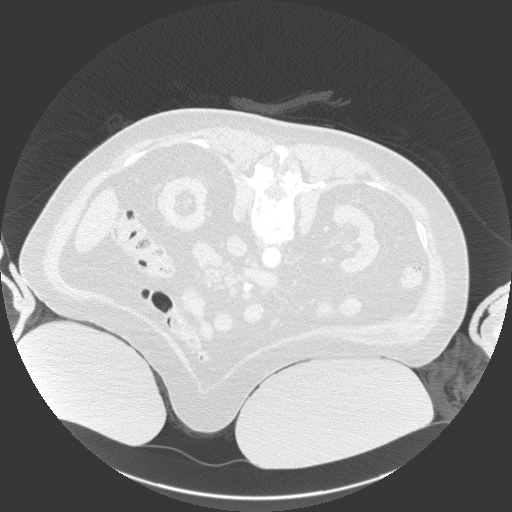
[im 54/78  lung]
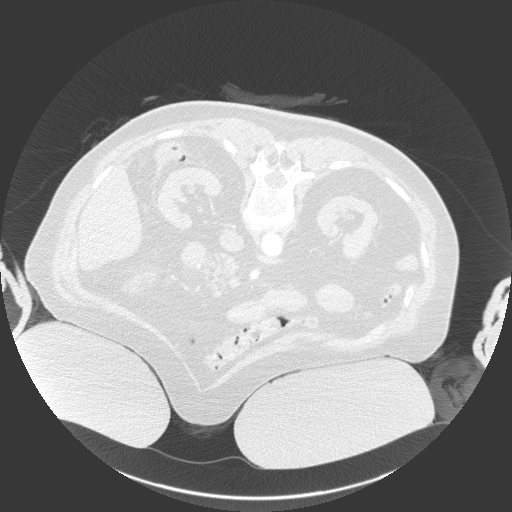

[8 of 38 positions shown; findings below may reference images not displayed]

MEDICATIONS:
None.

ANESTHESIA/SEDATION:
General - as administered by the Anesthesia department

FLUOROSCOPY:
None.

COMPLICATIONS:
None immediate.
Axial CT imaging was performed. The solid exophytic lesion and the
fatty exophytic lesions were successfully identified in the lower
and upper poles respectively. A suitable skin entry sites were
planned, and marked. The skin was then sterilely prepped and draped
in standard fashion using chlorhexidine skin prep.

Attention was first turned to the right lower pole solid lesion. Two
small dermatotomies were made in the skin. Under intermittent CT
guidance, a 15 cm 17 gauge trocar introducer needle was carefully
advanced and positioned at the margin of the mass. A single 2.3 cm
length 18 gauge core biopsy was obtained using the SADA
automated biopsy device. The biopsy specimen was placed in formalin
and delivered to pathology for further analysis.

Next, a 15 cm PR probe was advanced and used pierce the exophytic
lesion where it arises from the lower pole of the right kidney.
Thermal ablation was then performed with the probe powered at 65
SADA for 5 minutes.

At attention was turned to the upper pole fat containing
angiomyolipoma. In similar fashion, 2 small dermatotomy is were made
in the skin. Under intermittent CT guidance, 215 cm PR probes were
carefully advanced in parallel fashion into the center of the mass.
The probes were first advanced to the deep surface where it abuts
the central renal parenchyma. Percutaneous thermal ablation was then
performed with both probes powered at 65 SADA for 5 minutes. The
probes were then pulled back 2 cm into the more central aspect of
the mass and additional ablation was performed for an additional
minutes. Total ablation time 6.5 minutes. CT imaging was performed
intermittently during the ablation confirming excellent distribution
of gas throughout the mass.

Following removal of the probes, contrast enhanced CT imaging was
performed confirming successful complete ablation of the enhancing
lower pole lesion and no evidence of complication surrounding the
ablated angiomyolipoma.
FINDINGS: As above.  No immediate complication.
IMPRESSION: 1. Successful CT-guided biopsy of right lower pole renal mass.
2. Successful microwave ablation of right lower pole renal mass.
3. Successful microwave ablation of right upper pole angiomyolipoma.

## 2021-11-29 IMAGING — CT CT GUIDANCE TISSUE ABLATION
4 of 13 series · 8 of 38 positions shown, 9 images · non-contrast
Comparison: None Available.

INDICATION: 70-year-old male with a 4.4 cm angiomyolipoma arising exophytic from
the upper pole of the right kidney, and a 2.4 cm enhancing solid
mass exophytic from the lower pole of the right kidney. He presents
for CT-guided ablation of the lower pole lesion and percutaneous
thermal ablation with microwave of both the lower and upper pole
lesions.

EXAM:
1. CT-guided biopsy right lower pole renal mass
2. Microwave ablation right lower pole renal mass (presumed renal
cell carcinoma)
3. Microwave ablation right upper pole renal angiomyolipoma (greater
than 4 cm and at risk for spontaneous hemorrhage)
TECHNIQUE: Informed written consent was obtained from the patient after a
thorough discussion of the procedural risks, benefits and
alternatives. All questions were addressed. Maximal Sterile Barrier
Technique was utilized including caps, mask, sterile gowns, sterile
gloves, sterile drape, hand hygiene and skin antiseptic. A timeout
was performed prior to the initiation of the procedure.

[Series 2: i-spiral 2.0 bf37 · axial · 0.98mm/px · z∈[-124,-110]mm · 2 of 110 slices shown, 3 images (1 of 4)]
[im 48/110  mediastinal]
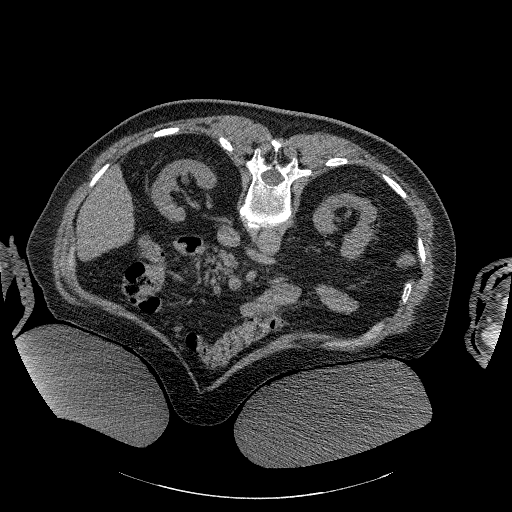
[im 48/110  lung]
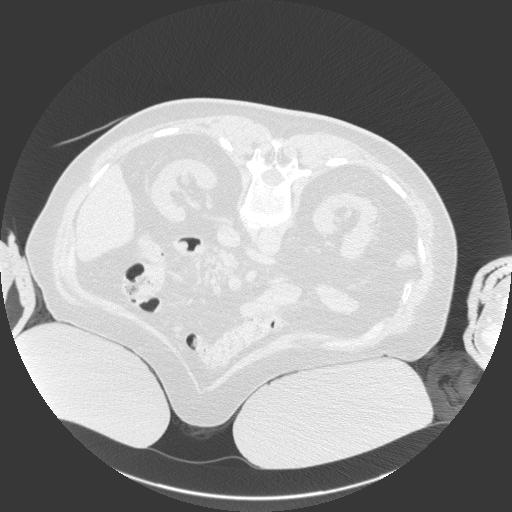
[im 55/110  lung]
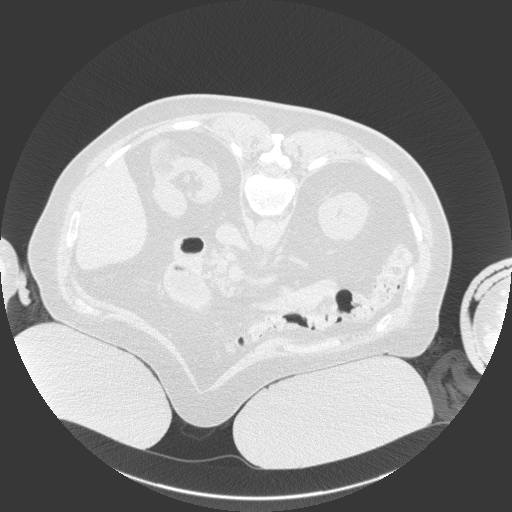

[Series 15: i-spiral 2.0 bf37 · axial · 0.98mm/px · z∈[-156,-126]mm · 2 of 78 slices shown (2 of 4)]
[im 39/78  lung]
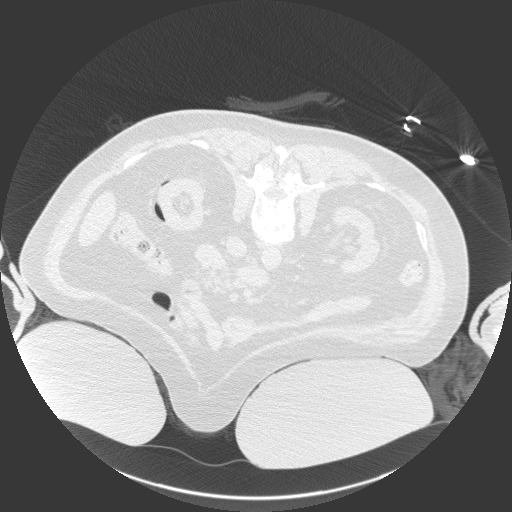
[im 54/78  lung]
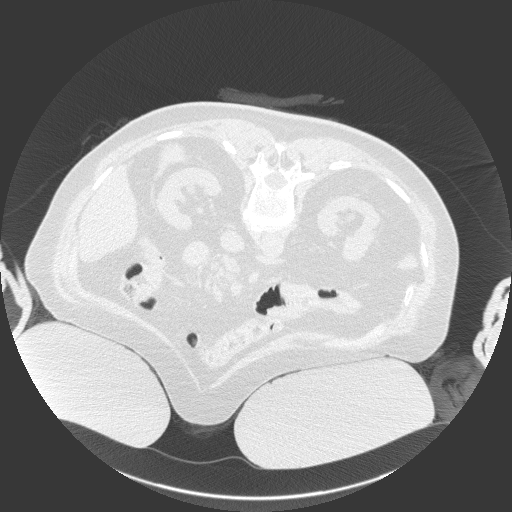

[Series 16: i-spiral 2.0 bf37 · axial · 0.98mm/px · z∈[-156,-126]mm · 2 of 78 slices shown (3 of 4)]
[im 39/78  lung]
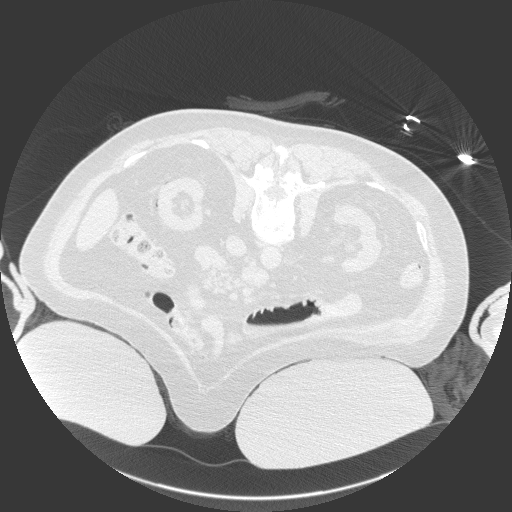
[im 54/78  lung]
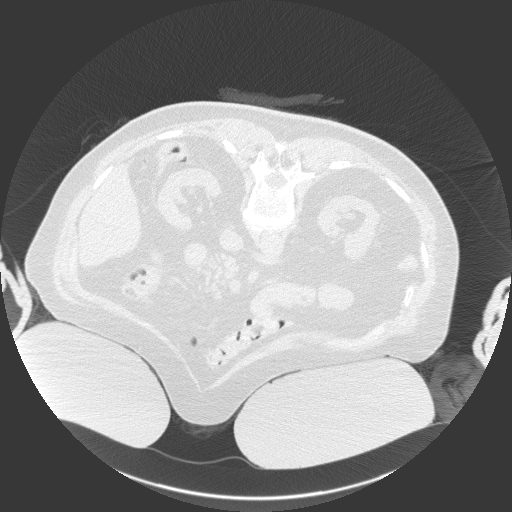

[Series 17: i-spiral 2.0 bf37 · axial · 0.98mm/px · z∈[-156,-126]mm · 2 of 78 slices shown (4 of 4)]
[im 39/78  lung]
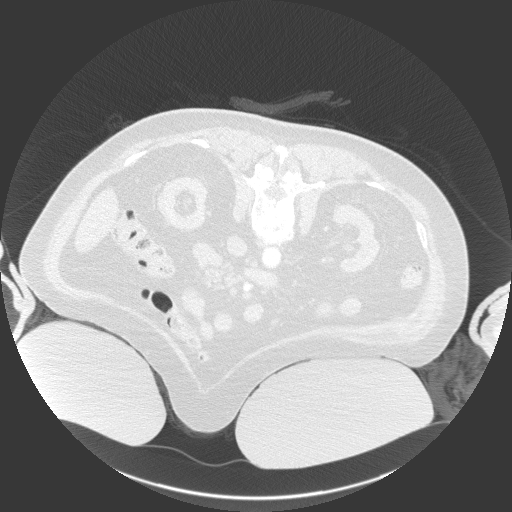
[im 54/78  lung]
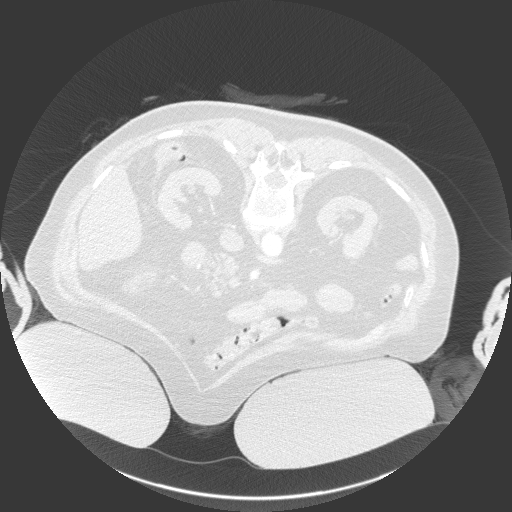

[8 of 38 positions shown; findings below may reference images not displayed]

MEDICATIONS:
None.

ANESTHESIA/SEDATION:
General - as administered by the Anesthesia department

FLUOROSCOPY:
None.

COMPLICATIONS:
None immediate.
Axial CT imaging was performed. The solid exophytic lesion and the
fatty exophytic lesions were successfully identified in the lower
and upper poles respectively. A suitable skin entry sites were
planned, and marked. The skin was then sterilely prepped and draped
in standard fashion using chlorhexidine skin prep.

Attention was first turned to the right lower pole solid lesion. Two
small dermatotomies were made in the skin. Under intermittent CT
guidance, a 15 cm 17 gauge trocar introducer needle was carefully
advanced and positioned at the margin of the mass. A single 2.3 cm
length 18 gauge core biopsy was obtained using the SADA
automated biopsy device. The biopsy specimen was placed in formalin
and delivered to pathology for further analysis.

Next, a 15 cm PR probe was advanced and used pierce the exophytic
lesion where it arises from the lower pole of the right kidney.
Thermal ablation was then performed with the probe powered at 65
SADA for 5 minutes.

At attention was turned to the upper pole fat containing
angiomyolipoma. In similar fashion, 2 small dermatotomy is were made
in the skin. Under intermittent CT guidance, 215 cm PR probes were
carefully advanced in parallel fashion into the center of the mass.
The probes were first advanced to the deep surface where it abuts
the central renal parenchyma. Percutaneous thermal ablation was then
performed with both probes powered at 65 SADA for 5 minutes. The
probes were then pulled back 2 cm into the more central aspect of
the mass and additional ablation was performed for an additional
minutes. Total ablation time 6.5 minutes. CT imaging was performed
intermittently during the ablation confirming excellent distribution
of gas throughout the mass.

Following removal of the probes, contrast enhanced CT imaging was
performed confirming successful complete ablation of the enhancing
lower pole lesion and no evidence of complication surrounding the
ablated angiomyolipoma.
FINDINGS: As above.  No immediate complication.
IMPRESSION: 1. Successful CT-guided biopsy of right lower pole renal mass.
2. Successful microwave ablation of right lower pole renal mass.
3. Successful microwave ablation of right upper pole angiomyolipoma.

## 2021-11-29 SURGERY — CT WITH ANESTHESIA
Anesthesia: General

## 2021-11-29 MED ORDER — ONDANSETRON HCL 4 MG/2ML IJ SOLN: 4.0000 mg | Freq: Once | INTRAMUSCULAR | Status: DC | PRN

## 2021-11-29 MED ORDER — ACETAMINOPHEN 10 MG/ML IV SOLN: 1000.0000 mg | Freq: Once | INTRAVENOUS | Status: DC | PRN

## 2021-11-29 MED ORDER — ROCURONIUM BROMIDE 10 MG/ML (PF) SYRINGE
PREFILLED_SYRINGE | INTRAVENOUS | Status: DC | PRN
  Administered 2021-11-29: 80 mg via INTRAVENOUS
  Administered 2021-11-29: 20 mg via INTRAVENOUS

## 2021-11-29 MED ORDER — FEBUXOSTAT 40 MG PO TABS
40.0000 mg | ORAL_TABLET | Freq: Every day | ORAL | Status: DC
  Administered 2021-11-29: 40 mg via ORAL
  Filled 2021-11-29 (×2): qty 1

## 2021-11-29 MED ORDER — CHLORHEXIDINE GLUCONATE 0.12 % MT SOLN: 15.0000 mL | Freq: Once | OROMUCOSAL | Status: DC

## 2021-11-29 MED ORDER — FENTANYL CITRATE (PF) 100 MCG/2ML IJ SOLN
INTRAMUSCULAR | Status: DC | PRN
  Administered 2021-11-29: 50 ug via INTRAVENOUS
  Administered 2021-11-29: 75 ug via INTRAVENOUS

## 2021-11-29 MED ORDER — IOHEXOL 350 MG/ML SOLN
100.0000 mL | Freq: Once | INTRAVENOUS | Status: DC | PRN
Start: 2021-11-29 — End: 2021-11-29

## 2021-11-29 MED ORDER — ASPIRIN 81 MG PO TBEC
81.0000 mg | DELAYED_RELEASE_TABLET | Freq: Every day | ORAL | Status: DC
  Administered 2021-11-29: 81 mg via ORAL
  Filled 2021-11-29 (×2): qty 1

## 2021-11-29 MED ORDER — SODIUM CHLORIDE (PF) 0.9 % IJ SOLN
INTRAMUSCULAR | Status: AC
  Filled 2021-11-29: qty 50

## 2021-11-29 MED ORDER — ROSUVASTATIN CALCIUM 20 MG PO TABS
40.0000 mg | ORAL_TABLET | Freq: Every day | ORAL | Status: DC
  Administered 2021-11-29 – 2021-11-30 (×2): 40 mg via ORAL
  Filled 2021-11-29 (×2): qty 2

## 2021-11-29 MED ORDER — VERAPAMIL HCL ER 240 MG PO TBCR
240.0000 mg | EXTENDED_RELEASE_TABLET | Freq: Every day | ORAL | Status: DC
  Administered 2021-11-29: 240 mg via ORAL
  Filled 2021-11-29: qty 1

## 2021-11-29 MED ORDER — HYDROCODONE-ACETAMINOPHEN 5-325 MG PO TABS
ORAL_TABLET | ORAL | Status: AC
  Filled 2021-11-29: qty 1

## 2021-11-29 MED ORDER — PHENYLEPHRINE HCL-NACL 20-0.9 MG/250ML-% IV SOLN
INTRAVENOUS | Status: DC | PRN
  Administered 2021-11-29: 40 ug/min via INTRAVENOUS

## 2021-11-29 MED ORDER — ORAL CARE MOUTH RINSE: 15.0000 mL | Freq: Once | OROMUCOSAL | Status: DC

## 2021-11-29 MED ORDER — DOCUSATE SODIUM 100 MG PO CAPS
100.0000 mg | ORAL_CAPSULE | Freq: Two times a day (BID) | ORAL | Status: DC
  Administered 2021-11-29 – 2021-11-30 (×3): 100 mg via ORAL
  Filled 2021-11-29 (×3): qty 1

## 2021-11-29 MED ORDER — AMISULPRIDE (ANTIEMETIC) 5 MG/2ML IV SOLN: 10.0000 mg | Freq: Once | INTRAVENOUS | Status: DC | PRN

## 2021-11-29 MED ORDER — IRBESARTAN 150 MG PO TABS
300.0000 mg | ORAL_TABLET | Freq: Every day | ORAL | Status: DC
  Administered 2021-11-29 – 2021-11-30 (×2): 300 mg via ORAL
  Filled 2021-11-29 (×2): qty 2

## 2021-11-29 MED ORDER — LIDOCAINE 2% (20 MG/ML) 5 ML SYRINGE
INTRAMUSCULAR | Status: DC | PRN
  Administered 2021-11-29: 100 mg via INTRAVENOUS

## 2021-11-29 MED ORDER — HYDROMORPHONE HCL 1 MG/ML IJ SOLN: 0.2500 mg | INTRAMUSCULAR | Status: DC | PRN

## 2021-11-29 MED ORDER — ONDANSETRON HCL 4 MG/2ML IJ SOLN
INTRAMUSCULAR | Status: DC | PRN
  Administered 2021-11-29: 4 mg via INTRAVENOUS

## 2021-11-29 MED ORDER — HYDRALAZINE HCL 20 MG/ML IJ SOLN
INTRAMUSCULAR | Status: AC
  Filled 2021-11-29: qty 1

## 2021-11-29 MED ORDER — SUGAMMADEX SODIUM 200 MG/2ML IV SOLN
INTRAVENOUS | Status: DC | PRN
  Administered 2021-11-29: 300 mg via INTRAVENOUS

## 2021-11-29 MED ORDER — LEVOTHYROXINE SODIUM 75 MCG PO TABS
150.0000 ug | ORAL_TABLET | Freq: Every day | ORAL | Status: DC
  Administered 2021-11-30: 150 ug via ORAL
  Filled 2021-11-29: qty 2

## 2021-11-29 MED ORDER — IOHEXOL 300 MG/ML  SOLN
100.0000 mL | Freq: Once | INTRAMUSCULAR | Status: AC | PRN
  Administered 2021-11-29: 100 mL via INTRAVENOUS

## 2021-11-29 MED ORDER — PROPOFOL 10 MG/ML IV BOLUS
INTRAVENOUS | Status: DC | PRN
  Administered 2021-11-29: 50 mg via INTRAVENOUS
  Administered 2021-11-29: 120 mg via INTRAVENOUS

## 2021-11-29 MED ORDER — FENTANYL CITRATE (PF) 250 MCG/5ML IJ SOLN
INTRAMUSCULAR | Status: AC
  Filled 2021-11-29: qty 5

## 2021-11-29 MED ORDER — HYDROCODONE-ACETAMINOPHEN 5-325 MG PO TABS
1.0000 | ORAL_TABLET | ORAL | Status: DC | PRN
  Administered 2021-11-29 (×2): 1 via ORAL
  Filled 2021-11-29: qty 1

## 2021-11-29 MED ORDER — OXYCODONE HCL 5 MG PO TABS: 5.0000 mg | ORAL_TABLET | Freq: Once | ORAL | Status: DC | PRN

## 2021-11-29 MED ORDER — FUROSEMIDE 20 MG PO TABS
20.0000 mg | ORAL_TABLET | Freq: Two times a day (BID) | ORAL | Status: DC
  Administered 2021-11-29 – 2021-11-30 (×2): 20 mg via ORAL
  Filled 2021-11-29 (×2): qty 1

## 2021-11-29 MED ORDER — HYDRALAZINE HCL 20 MG/ML IJ SOLN
10.0000 mg | Freq: Once | INTRAMUSCULAR | Status: AC
  Administered 2021-11-29: 10 mg via INTRAVENOUS

## 2021-11-29 MED ORDER — ONDANSETRON HCL 4 MG/2ML IJ SOLN: 4.0000 mg | Freq: Four times a day (QID) | INTRAMUSCULAR | Status: DC | PRN

## 2021-11-29 MED ORDER — OXYCODONE HCL 5 MG/5ML PO SOLN: 5.0000 mg | Freq: Once | ORAL | Status: DC | PRN

## 2021-11-29 MED ORDER — LACTATED RINGERS IV SOLN: INTRAVENOUS | Status: DC

## 2021-11-29 MED ORDER — DEXAMETHASONE SODIUM PHOSPHATE 10 MG/ML IJ SOLN
INTRAMUSCULAR | Status: DC | PRN
  Administered 2021-11-29: 10 mg via INTRAVENOUS

## 2021-11-29 NOTE — Procedures (Signed)
Interventional Radiology Procedure Note  Procedure:  1.) CT biopsy right lower pole renal mass 2.) MWA right lower pole renal mass 3.) MWA right upper pole renal AML  Complications: None  Estimated Blood Loss: None  Recommendations: - Admit for obs   Signed,  Criselda Peaches, MD

## 2021-11-29 NOTE — Transfer of Care (Signed)
Immediate Anesthesia Transfer of Care Note  Patient: Alan Campbell  Procedure(s) Performed: CT MICROWAVE ABLATION  Patient Location: PACU  Anesthesia Type:General  Level of Consciousness: awake and alert   Airway & Oxygen Therapy: Patient Spontanous Breathing and Patient connected to face mask oxygen  Post-op Assessment: Report given to RN, Post -op Vital signs reviewed and stable and Patient moving all extremities X 4  Post vital signs: Reviewed and stable  Last Vitals:  Vitals Value Taken Time  BP 172/90 11/29/21 1302  Temp    Pulse 52 11/29/21 1306  Resp 14 11/29/21 1306  SpO2 93 % 11/29/21 1306  Vitals shown include unvalidated device data.  Last Pain:  Vitals:   11/29/21 0910  TempSrc: Oral         Complications: No notable events documented.

## 2021-11-29 NOTE — Sedation Documentation (Signed)
Anesthesia in to sedate 

## 2021-11-29 NOTE — H&P (Signed)
Referring Physician(s): Camillia Herter  Supervising Physician: Jacqulynn Cadet  Patient Status:  [facility] OP TBA  Chief Complaint:  Right renal lesions  Subjective: Patient familiar to IR service from consultation with Dr. Laurence Ferrari on 10/31/2021 to discuss treatment options for right renal upper pole AML as well as right lower pole renal lesion suspicious for renal cell carcinoma.  Following discussions with Dr. Laurence Ferrari he was deemed an appropriate candidate for CT/ultrasound-guided microwave /thermal ablation of right upper pole AML as well as thermal ablation/ possible biopsy of right lower pole renal lesion.  He presents today for the procedure.  Past medical history also significant for hyperlipidemia, hypertension, hypothyroidism, obesity, osteoporosis, sleep apnea, vitamin D deficiency.  He currently denies fever, headache, chest pain, dyspnea, cough, abdominal/back pain, nausea, vomiting or bleeding.  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 Moderate to severe atypia, edge involved.   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  Medications: Prior to Admission medications   Medication Sig Start  Date End Date Taking? Authorizing Provider  Cholecalciferol (VITAMIN D) 2000 units CAPS Take 1 capsule (2,000 Units total) by mouth daily. 07/07/15  Yes Sowles, Drue Stager, MD  Cinnamon 500 MG TABS Take 1,000 mg by mouth daily.   Yes [provider]  CRANBERRY PO Take 4,200 mg by mouth daily.   Yes [provider]  febuxostat (ULORIC) 40 MG tablet Take 1 tablet (40 mg total) by mouth daily. 08/01/21  Yes Sowles, Drue Stager, MD  furosemide (LASIX) 20 MG tablet Take 1 tablet (20 mg total) by mouth 2 (two) times daily. 03/02/21 03/02/22 Yes Sowles, Drue Stager, MD  levothyroxine (SYNTHROID) 150 MCG tablet TAKE ONE TABLET BY MOUTH DAILY 11/15/21  Yes Teodora Medici, DO  Multiple Vitamins-Minerals (CENTRUM SILVER 50+MEN) TABS Take 1 tablet by mouth daily.   Yes [provider]  OMEGA-3 FATTY ACIDS PO Take 1 capsule by mouth daily. 01/06/07  Yes [provider]  potassium chloride SA (KLOR-CON) 20 MEQ tablet Take 1 tablet (20 mEq total) by mouth 2 (two) times daily. Take only when you take furosemide 03/02/21  Yes Sowles, Drue Stager, MD  predniSONE (DELTASONE) 20 MG tablet Take 1 tablet (20 mg total) by mouth 2 (two) times daily with a meal. For 3-5 days prn Patient taking differently: Take 20 mg by mouth 2 (two) times daily as needed (gout flare). For 3-5 days prn 08/01/21  Yes Sowles, Drue Stager, MD  Testosterone 20.25 MG/ACT (1.62%) GEL Apply 2 each topically daily. 2 pumps daily 11/30/20  Yes Sowles,  Drue Stager, MD  valsartan (DIOVAN) 320 MG tablet TAKE ONE TABLET BY MOUTH DAILY 10/05/21  Yes Sowles, Drue Stager, MD  verapamil (CALAN-SR) 240 MG CR tablet TAKE ONE TABLET BY MOUTH EVERY NIGHT AT BEDTIME 10/05/21  Yes Sowles, Drue Stager, MD  aspirin EC 81 MG tablet Take 81 mg by mouth daily. Swallow whole.    [provider]  rosuvastatin (CRESTOR) 40 MG tablet TAKE ONE TABLET BY MOUTH DAILY 11/23/21   Bo Merino, FNP     Vital Signs: BP (!) 190/81 (BP Location: Right Arm)   Pulse (!)  54   Temp 98.4 F (36.9 C) (Oral)   Resp 14   SpO2 97%   Physical Exam awake, alert.  Chest clear to auscultation bilaterally.  Heart with bradycardic but regular rhythm, no murmur.  Abdomen obese, soft, positive bowel sounds, nontender.  Trace pretibial edema bilaterally.  Imaging: No results found.  Labs:  CBC: Recent Labs    11/30/20 0000 11/23/21 0818  WBC 7.9 6.2  HGB 13.5 13.0  HCT 39.4 37.7*  PLT 217 215    COAGS: Recent Labs    11/23/21 0818  INR 1.0    BMP: Recent Labs    11/30/20 0000 03/02/21 0918 11/23/21 0818  NA 141 140 143  K 4.4 3.7 3.9  CL 107 105 110  CO2 '25 28 27  '$ GLUCOSE 101* 106* 116*  BUN 22 17 25*  CALCIUM 9.6 9.3 9.4  CREATININE 1.11 1.14 0.97  GFRNONAA 67  --  >60  GFRAA 78  --   --     LIVER FUNCTION TESTS: Recent Labs    11/30/20 0000 03/02/21 0918  BILITOT 0.8 0.7  AST 20 15  ALT 33 26  PROT 6.6 6.2    Assessment and Plan: Patient familiar to IR service from consultation with Dr. Laurence Ferrari on 10/31/2021 to discuss treatment options for right renal upper pole AML as well as right lower pole renal lesion suspicious for renal cell carcinoma.  Following discussions with Dr. Laurence Ferrari he was deemed an appropriate candidate for CT/ultrasound-guided microwave /thermal ablation of right upper pole AML as well as thermal ablation/ possible biopsy of right lower pole renal lesion.  He presents today for the procedure.  Past medical history also significant for hyperlipidemia, hypertension, hypothyroidism, obesity, osteoporosis, sleep apnea, vitamin D deficiency.  Details/risks of procedure, including but not limited to, internal bleeding, infection, injury to adjacent structures, anesthesia related complications discussed with patient with his understanding and consent.  LABS PENDING   Electronically Signed: D. Rowe Robert, PA-C 11/29/2021, 9:23 AM   I spent a total of 30 minutes  at the the patient's bedside AND on the  patient's hospital floor or unit, greater than 50% of which was counseling/coordinating care for CT/ultrasound-guided microwave ablation of right upper pole renal angiomyolipoma as well as microwave ablation/possible biopsy of right lower pole renal lesion

## 2021-11-29 NOTE — Anesthesia Postprocedure Evaluation (Signed)
Anesthesia Post Note  Patient: Alan Campbell  Procedure(s) Performed: CT MICROWAVE ABLATION     Patient location during evaluation: PACU Anesthesia Type: General Level of consciousness: awake and alert Pain management: pain level controlled Vital Signs Assessment: post-procedure vital signs reviewed and stable Respiratory status: spontaneous breathing, nonlabored ventilation, respiratory function stable and patient connected to nasal cannula oxygen Cardiovascular status: blood pressure returned to baseline and stable Postop Assessment: no apparent nausea or vomiting Anesthetic complications: no   No notable events documented.  Last Vitals:  Vitals:   11/29/21 1600 11/29/21 1704  BP: (!) 157/76 (!) 156/77  Pulse: (!) 55 (!) 58  Resp: 15 16  Temp: (!) 36.4 C 36.4 C  SpO2: 96% 96%    Last Pain:  Vitals:   11/29/21 1704  TempSrc: Oral  PainSc:                  Barnet Glasgow

## 2021-11-29 NOTE — Anesthesia Procedure Notes (Signed)
Procedure Name: Intubation Date/Time: 11/29/2021 10:40 AM  Performed by: Montel Clock, CRNAPre-anesthesia Checklist: Patient identified, Emergency Drugs available, Suction available, Patient being monitored and Timeout performed Patient Re-evaluated:Patient Re-evaluated prior to induction Oxygen Delivery Method: Circle system utilized Preoxygenation: Pre-oxygenation with 100% oxygen Induction Type: IV induction Ventilation: Mask ventilation without difficulty Laryngoscope Size: Mac and 4 Grade View: Grade II Tube type: Oral Tube size: 7.5 mm Number of attempts: 2 Airway Equipment and Method: Bougie stylet Placement Confirmation: ETT inserted through vocal cords under direct vision, positive ETCO2 and breath sounds checked- equal and bilateral Secured at: 25 cm Tube secured with: Tape Dental Injury: Teeth and Oropharynx as per pre-operative assessment and Injury to lip

## 2021-11-30 ENCOUNTER — Encounter (HOSPITAL_COMMUNITY): Payer: Self-pay | Admitting: Interventional Radiology

## 2021-11-30 DIAGNOSIS — D1771 Benign lipomatous neoplasm of kidney: Secondary | ICD-10-CM | POA: Diagnosis not present

## 2021-11-30 NOTE — Discharge Instructions (Signed)
Resume home medications; stay well hydrated; avoid strenuous activity for next 3-4 days

## 2021-11-30 NOTE — Progress Notes (Signed)
Transition of Care [facility]) Screening Note  Patient Details  Name: Alan Campbell Date of Birth: 1951/02/10  Transition of Care [facility]) CM/SW Contact:    Sherie Don, LCSW Phone Number: 11/30/2021, 10:40 AM  Transition of Care Department [facility]) has reviewed patient and no TOC needs have been identified at this time. We will continue to monitor patient advancement through interdisciplinary progression rounds. If new patient transition needs arise, please place a TOC consult.

## 2021-11-30 NOTE — Discharge Summary (Signed)
Patient ID: LENFORD BEDDOW [name]: [id number] DOB/AGE: 1950-09-17 71 y.o.  Admit date: 11/29/2021 Discharge date: 11/30/2021  Supervising Physician: Jacqulynn Cadet  Patient Status: [facility] - In-pt  Admission Diagnoses: Right upper pole renal angiomyolipoma and right lower pole renal mass  Discharge Diagnoses:  Right upper pole renal angiomyolipoma and right lower pole renal mass, s/p 6/14 1. Successful CT-guided biopsy of right lower pole renal mass. 2. Successful microwave ablation of right lower pole renal mass. 3. Successful microwave ablation of right upper pole angiomyolipoma.   Discharged Condition: good  Hospital Course: Mr Gassmann is a 71 yo male with hx of right upper pole renal angiomyolipoma and right lower pole renal mass who underwent CT guided ablation of right upper and right lower pole renal masses and biopsy of right lower pole renal lesion on 6/14. He was admitted for observation and tolerated the procedure well. Pt is pain-free, reports not hematuria, and has no concerns on 6/15. He was deemed stable for discharge. He will resume his home medications. IR will follow-up with patient in 2 weeks.  Consults:  Anesthesia  Significant Diagnostic Studies:  Results for orders placed or performed during the hospital encounter of 11/29/21  ABO/Rh  Result Value Ref Range   ABO/RH(D)      A POS Performed at [facility], [place] [address] [place], [place] [address]    Recent Results (from the past 2160 hour(s))  Protime-INR     Status: None   Collection Time: 11/23/21  8:18 AM  Result Value Ref Range   Prothrombin Time 12.6 11.4 - 15.2 seconds   INR 1.0 0.8 - 1.2    Comment: (NOTE) INR goal varies based on device and disease states. Performed at [facility], [place] [address], [place], [place] [address]   CBC with Differential/Platelet     Status: Abnormal   Collection Time: 11/23/21  8:18 AM  Result Value Ref Range   WBC 6.2 4.0 -  10.5 K/uL   RBC 4.17 (L) 4.22 - 5.81 MIL/uL   Hemoglobin 13.0 13.0 - 17.0 g/dL   HCT 37.7 (L) 39.0 - 52.0 %   MCV 90.4 80.0 - 100.0 fL   MCH 31.2 26.0 - 34.0 pg   MCHC 34.5 30.0 - 36.0 g/dL   RDW 14.6 11.5 - 15.5 %   Platelets 215 150 - 400 K/uL   nRBC 0.0 0.0 - 0.2 %   Neutrophils Relative % 68 %   Neutro Abs 4.3 1.7 - 7.7 K/uL   Lymphocytes Relative 22 %   Lymphs Abs 1.4 0.7 - 4.0 K/uL   Monocytes Relative 6 %   Monocytes Absolute 0.4 0.1 - 1.0 K/uL   Eosinophils Relative 3 %   Eosinophils Absolute 0.2 0.0 - 0.5 K/uL   Basophils Relative 1 %   Basophils Absolute 0.0 0.0 - 0.1 K/uL   Immature Granulocytes 0 %   Abs Immature Granulocytes 0.02 0.00 - 0.07 K/uL    Comment: Performed at [facility], [place] [address], [place], [place] [address]  Basic metabolic panel     Status: Abnormal   Collection Time: 11/23/21  8:18 AM  Result Value Ref Range   Sodium 143 135 - 145 mmol/L   Potassium 3.9 3.5 - 5.1 mmol/L   Chloride 110 98 - 111 mmol/L   CO2 27 22 - 32 mmol/L   Glucose, Bld 116 (H) 70 - 99 mg/dL    Comment: Glucose reference range applies  only to samples taken after fasting for at least 8 hours.   BUN 25 (H) 8 - 23 mg/dL   Creatinine, Ser 0.97 0.61 - 1.24 mg/dL   Calcium 9.4 8.9 - 10.3 mg/dL   GFR, Estimated >60 >60 mL/min    Comment: (NOTE) Calculated using the CKD-EPI Creatinine Equation (2021)    Anion gap 6 5 - 15    Comment: Performed at [facility], [place] [address], [place], [place] [address]  Type and screen     Status: None   Collection Time: 11/23/21  8:18 AM  Result Value Ref Range   ABO/RH(D) A POS    Antibody Screen NEG    Sample Expiration 12/02/2021,2359    Extend sample reason      NO TRANSFUSIONS OR PREGNANCY IN THE PAST 3 MONTHS Performed at [facility], [place] [address], [place], [place] [address]   Hemoglobin A1c per protocol     Status: Abnormal   Collection Time: 11/23/21  8:18 AM  Result  Value Ref Range   Hgb A1c MFr Bld 4.5 (L) 4.8 - 5.6 %    Comment: (NOTE) Pre diabetes:          5.7%-6.4%  Diabetes:              >6.4%  Glycemic control for   <7.0% adults with diabetes    Mean Plasma Glucose 82.45 mg/dL    Comment: Performed at [facility], [place] [address] [place], [place] [address]  ABO/Rh     Status: None   Collection Time: 11/29/21  6:45 AM  Result Value Ref Range   ABO/RH(D)      A POS Performed at [facility], [place] [address] [place], [place] [address]      Treatments: 11/29/21: 1. Successful CT-guided biopsy of right lower pole renal mass. 2. Successful microwave ablation of right lower pole renal mass. 3. Successful microwave ablation of right upper pole angiomyolipoma.  Discharge Exam: Blood pressure (!) 156/83, pulse (!) 53, temperature 98.1 F (36.7 C), temperature source Oral, resp. rate 18, height 5' 10.5" (1.791 m), weight 231 lb 11.3 oz (105.1 kg), SpO2 97 %. Patient awake, alert. Chest CTA. Heart regular rate and rhythm, + for murmur. Abdomen soft and non-tender with positive bowel sounds. Puncture sites right flank clean, non-tender, no hematoma. No lower extremity edema  Disposition: Discharge disposition: 01-Home or Self Care       Discharge Instructions     Call MD for:  difficulty breathing, headache or visual disturbances   Complete by: As directed    Call MD for:  extreme fatigue   Complete by: As directed    Call MD for:  hives   Complete by: As directed    Call MD for:  persistant dizziness or light-headedness   Complete by: As directed    Call MD for:  persistant nausea and vomiting   Complete by: As directed    Call MD for:  redness, tenderness, or signs of infection (pain, swelling, redness, odor or green/yellow discharge around incision site)   Complete by: As directed    Call MD for:  severe uncontrolled pain   Complete by: As directed    Call MD for:  temperature >100.4   Complete by: As  directed    Change dressing (specify)   Complete by: As directed    May place bandage over puncture sites on right flank for the next 2-3 days; may  wash sites with soap and water   Diet - low sodium heart healthy   Complete by: As directed    Discharge instructions   Complete by: As directed    Resume home medications, stay well hydrated, avoid strenuous activity for the next 3-4 days.   Driving Restrictions   Complete by: As directed    No driving for next 24 hours or after taking narcotics   Increase activity slowly   Complete by: As directed    Lifting restrictions   Complete by: As directed    No heavy lifting 3-4 days   May shower / Bathe   Complete by: As directed    May walk up steps   Complete by: As directed          Follow-up Information     Criselda Peaches, MD Follow up.   Specialties: Interventional Radiology, Radiology Why: Dr Laurence Ferrari will contact you within the next 2 weeks; call [phone] or [phone] with any questions Contact information: [address] [place] [phone]         Hollice Espy, MD Follow up.   Specialty: Urology Why: Follow up with Dr Erlene Quan as scheduled Contact information: [place] [address] [phone]                  Electronically Signed: Lura Em, Tony 11/30/2021, 10:48 AM   I have spent Less Than 30 Minutes discharging [employer]	[place].

## 2021-11-30 NOTE — Progress Notes (Signed)
Assessment unchanged. Pt verbalized understanding of dc instructions through teach back including medications and follow up care.  Discharged to front entrance accompanied by NT. 

## 2021-12-01 ENCOUNTER — Ambulatory Visit: Payer: Medicare Other | Admitting: Family Medicine

## 2021-12-01 LAB — SURGICAL PATHOLOGY

## 2021-12-07 ENCOUNTER — Encounter: Payer: Self-pay | Admitting: Family Medicine

## 2021-12-11 ENCOUNTER — Other Ambulatory Visit: Payer: Self-pay | Admitting: Family Medicine

## 2021-12-11 DIAGNOSIS — M109 Gout, unspecified: Secondary | ICD-10-CM

## 2021-12-11 MED ORDER — PREDNISONE 20 MG PO TABS: 20.0000 mg | ORAL_TABLET | Freq: Two times a day (BID) | ORAL | 0 refills | Status: DC

## 2021-12-28 ENCOUNTER — Encounter: Payer: Self-pay | Admitting: Urology

## 2021-12-28 ENCOUNTER — Ambulatory Visit (INDEPENDENT_AMBULATORY_CARE_PROVIDER_SITE_OTHER): Payer: Medicare Other | Admitting: Urology

## 2021-12-28 VITALS — BP 173/85 | HR 76 | Ht 70.0 in | Wt 233.0 lb

## 2021-12-28 DIAGNOSIS — N2889 Other specified disorders of kidney and ureter: Secondary | ICD-10-CM

## 2021-12-28 NOTE — Progress Notes (Signed)
12/28/21 12:53 PM   Alan Campbell 05-Dec-1950 [id number]  Referring provider:  [name], MD [address] [place],  [place] [address] Chief Complaint  Patient presents with   Follow-up    Renal Mass     HPI: Alan Campbell is a 71 y.o.male with a personal history of retractile testis, hypogonadism, epididymal cyst, renal mass, microscopic hematuria and angiomyolipoma, who presents today for follow-up on tumor in right kidney.   04/19/2021 MRI abdomen with and without contrast revealed a 3.3 x 3.6 cm lesion on 22/6 which has macroscopic fat within consistent with angiomyolipoma , including T2 hypointensity on series 6 and hypointensity at the mass/renal parenchymal interface on out of phase image 61/13. 2.0 cm exophytic lesion is mildly T2 hyperintense .  This exophytic enhancing mass at the lower pole of right kidney  had slight interval increase in size that was visualized on MRI on 10/21/2021. It was 2.4 cm. It also visualized Bilateral renal angiomyolipomas, largest in the upper pole right kidney. Small right renal cysts. Hepatic cysts including a complex left hepatic cyst. Hepatosplenomegaly  He underwent a CT guided biopsy and tissue ablation on 11/29/2021 with Dr Laurence Ferrari. Surgical pathology revealed oncocytic neoplasm.   He reports Dr Ancil Boozer manages he androgen depo therapy. He reports that he has some nocturia.    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 Moderate to severe atypia, edge involved.   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    Surgical History: Past Surgical History:  Procedure Laterality Date   BACK  SURGERY     COLONOSCOPY WITH PROPOFOL N/A 08/14/2019   Procedure: COLONOSCOPY WITH PROPOFOL;  Surgeon: Lin Landsman, MD;  Location: [facility];  Service: Gastroenterology;  Laterality: N/A;  PRIORITY 4   EYE SURGERY  2016   right cataract removal   IR RADIOLOGIST EVAL & MGMT  10/31/2021   LAMINECTOMY  1985   L-5   RADIOLOGY WITH ANESTHESIA N/A 11/29/2021   Procedure: CT MICROWAVE ABLATION;  Surgeon: Criselda Peaches, MD;  Location: [facility];  Service: Radiology;  Laterality: N/A;   SPINE SURGERY  1985    Home Medications:  Allergies as of 12/28/2021   No Known Allergies      Medication List        Accurate as of December 28, 2021 12:53 PM. If you have any questions, ask your nurse or doctor.          aspirin EC 81 MG tablet Take 81 mg by mouth daily. Swallow whole.   Centrum Silver 50+Men Tabs Take 1 tablet by mouth daily.   Cinnamon 500 MG Tabs Take 1,000 mg by mouth daily.   CRANBERRY PO Take 4,200 mg by mouth daily.   febuxostat 40 MG tablet Commonly known as: ULORIC Take 1 tablet (40 mg total) by mouth daily.   furosemide 20 MG tablet Commonly known as: LASIX Take 1 tablet (20 mg total) by mouth 2 (two) times daily.   levothyroxine 150 MCG tablet Commonly known as: SYNTHROID TAKE  ONE TABLET BY MOUTH DAILY   OMEGA-3 FATTY ACIDS PO Take 1 capsule by mouth daily.   potassium chloride SA 20 MEQ tablet Commonly known as: KLOR-CON M Take 1 tablet (20 mEq total) by mouth 2 (two) times daily. Take only when you take furosemide   predniSONE 20 MG tablet Commonly known as: DELTASONE Take 1 tablet (20 mg total) by mouth 2 (two) times daily with a meal. For 3-5 days prn   rosuvastatin 40 MG tablet Commonly known as: CRESTOR TAKE ONE TABLET BY MOUTH DAILY   Testosterone 20.25 MG/ACT (1.62%) Gel Apply 2 each topically daily. 2 pumps daily   valsartan 320 MG tablet Commonly known as: DIOVAN TAKE ONE TABLET BY MOUTH DAILY   verapamil 240 MG CR  tablet Commonly known as: CALAN-SR TAKE ONE TABLET BY MOUTH EVERY NIGHT AT BEDTIME   Vitamin D 50 MCG (2000 UT) Caps Take 1 capsule (2,000 Units total) by mouth daily.        Allergies:  No Known Allergies  Family History: Family History  Problem Relation Age of Onset   Heart disease Father    Early death Father    Hypothyroidism Sister    Thyroid disease Sister    Hypothyroidism Sister    Migraines Sister    Prostate cancer Neg Hx    Bladder Cancer Neg Hx    Kidney cancer Neg Hx     Social History:  reports that he quit smoking about 51 years ago. His smoking use included cigarettes. He has a 2.50 pack-year smoking history. He has never used smokeless tobacco. He reports current alcohol use of about 4.0 standard drinks of alcohol per week. He reports that he does not use drugs.   Physical Exam: BP (!) 173/85   Pulse 76   Ht '5\' 10"'$  (1.778 m)   Wt 233 lb (105.7 kg)   BMI 33.43 kg/m   Constitutional:  Alert and oriented, No acute distress. HEENT: [place] AT, moist mucus membranes.  Trachea midline, no masses. Cardiovascular: No clubbing, cyanosis, or edema. Respiratory: Normal respiratory effort, no increased work of breathing. Skin: No rashes, bruises or suspicious lesions. Neurologic: Grossly intact, no focal deficits, moving all 4 extremities. Psychiatric: Normal mood and affect.  Laboratory Data:  Lab Results  Component Value Date   CREATININE 0.97 11/23/2021   Lab Results  Component Value Date   HGBA1C 4.5 (L) 11/23/2021    Assessment & Plan:    Right renal mass  - S/p CT ablation and biopsy -Reviewed and discussed pathology - Will release care to intervential radiology who will follow with serial imaging  Return if symptoms worsen or fail to improve.  Conley Rolls as a Education administrator for Hollice Espy, MD.,have documented all relevant documentation on the behalf of Hollice Espy, MD,as directed by  Hollice Espy, MD while in the presence of  Hollice Espy, MD.  I have reviewed the above documentation for accuracy and completeness, and I agree with the above.   Hollice Espy, MD   [facility] [address] [place], [place] [address] [phone]

## 2022-01-09 ENCOUNTER — Ambulatory Visit
Admission: RE | Admit: 2022-01-09 | Discharge: 2022-01-09 | Disposition: A | Discharge status: Home / Self Care | Payer: Medicare Other | Source: Ambulatory Visit | Attending: Student | Admitting: Student

## 2022-01-09 ENCOUNTER — Encounter: Payer: Self-pay | Admitting: *Deleted

## 2022-01-09 DIAGNOSIS — N2889 Other specified disorders of kidney and ureter: Secondary | ICD-10-CM

## 2022-01-09 HISTORY — PX: IR RADIOLOGIST EVAL & MGMT: IMG5224

## 2022-01-09 NOTE — Progress Notes (Signed)
Chief Complaint: Patient was consulted remotely today (TeleHealth) for right renal AML and enhancing mass at the request of Campbell,Alan L.    Referring Physician(s): Hollice Espy, MD  History of Present Illness:  Alan Campbell is a 71 y.o. 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  He underwent percutaneous microwave ablation of the upper pole AML with concurrent biopsy and percutaneous microwave ablation of the lower pole enhancing renal mass on 11/29/2021.  He was discharged home the following day in good condition.  His recovery was uneventful.  He had minimal pain, no issues with dysuria, hematuria or other symptoms.  He has returned to work and is at full activities and has been for several weeks.  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  Of course, renal cell carcinomas are known to have areas of oncocytic features and the false negative biopsy rate is nearly 50%.  However, these biopsy results are good news.  Alan Campbell is quite pleased with his procedure and results and has no active complaints at this time.   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 Moderate to severe atypia, edge involved.   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    Past Surgical History:  Procedure Laterality Date   BACK SURGERY     COLONOSCOPY WITH PROPOFOL N/A 08/14/2019   Procedure: COLONOSCOPY WITH PROPOFOL;  Surgeon: Lin Landsman, MD;  Location: [facility];  Service: Gastroenterology;  Laterality: N/A;  PRIORITY 4   EYE SURGERY  2016   right cataract removal   IR RADIOLOGIST EVAL & MGMT  10/31/2021   LAMINECTOMY  1985   L-5   RADIOLOGY WITH ANESTHESIA N/A 11/29/2021   Procedure: CT MICROWAVE ABLATION;  Surgeon: Criselda Peaches, MD;  Location: [facility];  Service: Radiology;  Laterality: N/A;   SPINE SURGERY  1985    Allergies: Patient has no known allergies.  Medications: Prior to Admission medications   Medication Sig Start Date End Date Taking? Authorizing Provider  aspirin EC 81 MG tablet Take 81 mg by mouth daily. Swallow whole.    [provider]  Cholecalciferol (VITAMIN D) 2000 units CAPS Take 1 capsule (2,000 Units total) by mouth daily. 07/07/15   [name], MD  Cinnamon 500 MG TABS Take 1,000 mg by mouth daily.    [provider]  CRANBERRY PO Take 4,200 mg by mouth daily.    [provider]  febuxostat (ULORIC) 40 MG tablet Take 1 tablet (40 mg total) by mouth daily. 08/01/21   [name], MD  furosemide (LASIX) 20 MG tablet Take 1 tablet (20 mg total) by mouth 2 (two) times daily. 03/02/21 03/02/22  [name], MD  levothyroxine (SYNTHROID) 150 MCG tablet TAKE ONE TABLET BY MOUTH DAILY 11/15/21   Teodora Medici, DO  Multiple Vitamins-Minerals (  CENTRUM SILVER 50+MEN) TABS Take 1 tablet by mouth daily.    [provider]  OMEGA-3 FATTY ACIDS PO Take 1 capsule by mouth daily. 01/06/07   [provider]  potassium chloride SA (KLOR-CON) 20 MEQ tablet Take 1 tablet (20 mEq total) by mouth 2 (two) times daily. Take only when you take furosemide 03/02/21   [name], MD  predniSONE (DELTASONE) 20 MG tablet Take 1 tablet (20 mg total) by mouth 2 (two) times daily with a meal. For 3-5 days prn 12/11/21   [name], MD  rosuvastatin (CRESTOR) 40 MG tablet TAKE ONE TABLET BY MOUTH DAILY 11/23/21   Bo Merino, FNP  Testosterone 20.25  MG/ACT (1.62%) GEL Apply 2 each topically daily. 2 pumps daily 11/30/20   [name], MD  valsartan (DIOVAN) 320 MG tablet TAKE ONE TABLET BY MOUTH DAILY 10/05/21   [name], MD  verapamil (CALAN-SR) 240 MG CR tablet TAKE ONE TABLET BY MOUTH EVERY NIGHT AT BEDTIME 10/05/21   [name], MD     Family History  Problem Relation Age of Onset   Heart disease Father    Early death Father    Hypothyroidism Sister    Thyroid disease Sister    Hypothyroidism Sister    Migraines Sister    Prostate cancer Neg Hx    Bladder Cancer Neg Hx    Kidney cancer Neg Hx     Social History   Socioeconomic History   Marital status: Married    Spouse name: Alan Campbell   Number of children: 0   Years of education: Not on file   Highest education level: Not on file  Occupational History   Occupation: Investment banker, corporate   Tobacco Use   Smoking status: Former    Packs/day: 0.50    Years: 5.00    Total pack years: 2.50    Types: Cigarettes    Quit date: 10/03/1970    Years since quitting: 51.3   Smokeless tobacco: Never   Tobacco comments:    Quit 1972  Vaping Use   Vaping Use: Never used  Substance and Sexual Activity   Alcohol use: Yes    Alcohol/week: 4.0 standard drinks of alcohol    Types: 4 Standard drinks or equivalent per week   Drug use: No   Sexual activity: Yes    Partners: Female  Other Topics Concern   Not on file  Social History Narrative   Married since 1974, no children but they helped raise Alan Campbell ( wife's niece), also helped raise their God-daughter Alan Campbell   He is planning on retiring January 2021   Social Determinants of Health   Financial Resource Strain: Low Risk  (07/27/2021)   Overall Financial Resource Strain (CARDIA)    Difficulty of Paying Living Expenses: Not hard at all  Food Insecurity: No Food Insecurity (10/04/2021)   Hunger Vital Sign    Worried About Running Out of Food in the Last Year: Never true    [place] in the Last Year: Never true   Transportation Needs: No Transportation Needs (10/04/2021)   PRAPARE - Hydrologist (Medical): No    Lack of Transportation (Non-Medical): No  Physical Activity: Inactive (07/27/2021)   Exercise Vital Sign    Days of Exercise per Week: 0 days    Minutes of Exercise per Session: 0 min  Stress: No Stress Concern Present (07/27/2021)   [place]  Feeling of Stress : Not at all  Social Connections: Socially Integrated (07/27/2021)   Social Connection and Isolation Panel [NHANES]    Frequency of Communication with Friends and Family: More than three times a week    Frequency of Social Gatherings with Friends and Family: Three times a week    Attends Religious Services: More than 4 times per year    Active Member of Clubs or Organizations: Yes    Attends Music therapist: More than 4 times per year    Marital Status: Married    ECOG Status: 0 - Asymptomatic  Review of Systems  Review of Systems: A 12 point ROS discussed and pertinent positives are indicated in the HPI above.  All other systems are negative.  Physical Exam No direct physical exam was performed (except for noted visual exam findings with Video Visits).    Vital Signs: There were no vitals taken for this visit.  Imaging: No results found.  Labs:  CBC: Recent Labs    11/23/21 0818  WBC 6.2  HGB 13.0  HCT 37.7*  PLT 215    COAGS: Recent Labs    11/23/21 0818  INR 1.0    BMP: Recent Labs    03/02/21 0918 11/23/21 0818  NA 140 143  K 3.7 3.9  CL 105 110  CO2 28 27  GLUCOSE 106* 116*  BUN 17 25*  CALCIUM 9.3 9.4  CREATININE 1.14 0.97  GFRNONAA  --  >60    LIVER FUNCTION TESTS: Recent Labs    03/02/21 0918  BILITOT 0.7  AST 15  ALT 26  PROT 6.2    TUMOR MARKERS: No results for input(s): "AFPTM", "CEA", "CA199", "CHROMGRNA" in the last 8760 hours.  Assessment and  Plan:  71 year old gentleman now 5 weeks status post percutaneous thermal ablation of a 4.4 cm right upper pole angiomyolipoma and a 2.4 cm right lower pole enhancing renal mass.  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  He has recovered completely from his procedure and is back at full activity.  We will continue with routine surveillance imaging.  1.)  Initial follow-up MR abdomen with gadolinium contrast in mid December 2023 with accompanying clinic visit.     Electronically Signed: Criselda Peaches 01/09/2022, 10:10 AM   I spent a total of  15 Minutes in remote  clinical consultation, greater than 50% of which was counseling/coordinating care for right renal AML and oncocytic neoplasm.    Visit type: Audio only (telephone). Audio (no video) only due to patient preference. Alternative for in-person consultation at [facility], [address] [place], [place], [place]. This visit type was conducted due to national recommendations for restrictions regarding the COVID-19 Pandemic (e.g. social distancing).  This format is felt to be most appropriate for this patient at this time.  All issues noted in this document were discussed and addressed.

## 2022-01-16 NOTE — Progress Notes (Unsigned)
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 Erlene Quan and will follow up with interventional radiologist - Laurence Ferrari , he will go back for a MRI every 6 months x 2 to monitor . He is feeling well, no hematuria, and no pain   Aorta atherosclerosis/hyperlipidemia: : he is taking higher dose of statin to get LDL below 70   Hypothyroidism: he is on levothyroxine 150 mcg seven days a week now,  last TSH was at goal but he is due for repeat TSH  Denies change in bowel movements , dry skin  or dysphagia.    Hypogonadism: last PSA was normal again 02/2021 Testosterone helps with fatigue.   He states two pumps seems to be good for him. We will not recheck level since responding to therapy   OA  Bilateral   seen by [facility] but not severe enough for surgery,  he was taking NsAID's otc , however since proteinuria he has been taking Tylenol prn and we gave Tramadol in September that he only takes prn, unchanged    Metabolic syndrome: he was on Metformin for many years, but still had insulin resistance and weight was trending up, he was given GLP agonists at the Spring 2018. He is now off medication but changed diet and continues to lose weight    HTN: taking medication daily and denies side effects, EKG was abnormal seen by Dr. Clayborn Bigness and had stress test and echo done 07/2017, mild right ventricular enlargement otherwise normal, he occasionally snores, denies decreased in exercise tolerance.BP today is borderline today   Echo 2019 at Dr. Etta Quill office   NORMAL LEFT VENTRICULAR SYSTOLIC FUNCTION WITH AN ESTIMATED EF = >55 %  NORMAL RIGHT VENTRICULAR SYSTOLIC FUNCTION  MILD TRICUSPID  AND MITRAL VALVE INSUFFICIENCY  NO VALVULAR STENOSIS  MILD RV ENLARGEMENT  MILD BIATRIAL ENLARGEMENT   Gout: had a flare recently but took prednisone and doing well now , we will recheck uric acid, on Uloric   Morbid obesity: he has BMI above 35 with HTN, dyslipidemia , OA. 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   Cyst of kidney, acquired 03/22/2021   Other proteinuria 03/22/2021   Lymphedema 03/22/2021   Other obesity 03/22/2021   Pain in joint of right knee 03/20/2021   Anemia 02/14/2021   Hypertension 02/14/2021   Posterior vitreous detachment, left eye 10/04/2020   Lymphedema 03/22/2020   Chronic venous insufficiency 03/22/2020   Morbid obesity ([place]) 02/29/2020   Right epiretinal membrane 10/05/2019   Posterior vitreous detachment of right eye 10/05/2019   Nuclear sclerotic cataract of left eye 10/05/2019   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 10/05/2019   Tendinitis of foot 09/15/2019   Epididymal cyst 06/24/2016   Benign essential HTN 12/30/2014   Dyslipidemia 12/30/2014   Adult hypothyroidism 40/98/1191   Dysmetabolic syndrome 47/82/9562   Arthritis,  degenerative 12/30/2014   Perennial allergic rhinitis with seasonal variation 12/30/2014   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   Patella-femoral syndrome 12/03/2014   Low back pain 04/03/2010   Vitamin D deficiency 04/14/2009   OP (osteoporosis) 05/05/2007    Past Surgical History:  Procedure Laterality Date   BACK SURGERY     COLONOSCOPY WITH PROPOFOL N/A 08/14/2019   Procedure: COLONOSCOPY WITH PROPOFOL;  Surgeon: Lin Landsman, MD;  Location: [facility];  Service: Gastroenterology;  Laterality: N/A;  PRIORITY 4   EYE SURGERY  2016    right cataract removal   IR RADIOLOGIST EVAL & MGMT  10/31/2021   IR RADIOLOGIST EVAL & MGMT  01/09/2022   LAMINECTOMY  1985   L-5   RADIOLOGY WITH ANESTHESIA N/A 11/29/2021   Procedure: CT MICROWAVE ABLATION;  Surgeon: Criselda Peaches, MD;  Location: [facility];  Service: Radiology;  Laterality: N/A;   SPINE SURGERY  1985    Family History  Problem Relation Age of Onset   Heart disease Father    Early death Father    Hypothyroidism Sister    Thyroid disease Sister    Hypothyroidism Sister    Migraines Sister    Prostate cancer Neg Hx    Bladder Cancer Neg Hx    Kidney cancer Neg Hx     Social History   Tobacco Use   Smoking status: Former    Packs/day: 0.50    Years: 5.00    Total pack years: 2.50    Types: Cigarettes    Quit date: 10/03/1970    Years since quitting: 51.3   Smokeless tobacco: Never   Tobacco comments:    Quit 1972  Substance Use Topics   Alcohol use: Yes    Alcohol/week: 4.0 standard drinks of alcohol    Types: 4 Standard drinks or equivalent per week     Current Outpatient Medications:    aspirin EC 81 MG tablet, Take 81 mg by mouth daily. Swallow whole., Disp: , Rfl:    Cholecalciferol (VITAMIN D) 2000 units CAPS, Take 1 capsule (2,000 Units total) by mouth daily., Disp: 30 capsule, Rfl: 0   Cinnamon 500 MG TABS, Take 1,000 mg by mouth daily., Disp: , Rfl:    CRANBERRY PO, Take 4,200 mg by mouth daily., Disp: , Rfl:    febuxostat (ULORIC) 40 MG tablet, Take 1 tablet (40 mg total) by mouth daily., Disp: 90 tablet, Rfl: 1   furosemide (LASIX) 20 MG tablet, Take 1 tablet (20 mg total) by mouth 2 (two) times daily., Disp: 180 tablet, Rfl: 1   levothyroxine (SYNTHROID) 150 MCG tablet, TAKE ONE TABLET BY MOUTH DAILY, Disp: 90 tablet, Rfl: 0   Multiple Vitamins-Minerals (CENTRUM SILVER 50+MEN) TABS, Take 1 tablet by mouth daily., Disp: , Rfl:    OMEGA-3 FATTY ACIDS PO, Take 1 capsule by mouth daily., Disp: , Rfl:    potassium chloride SA (KLOR-CON) 20  MEQ tablet, Take 1 tablet (20 mEq total) by mouth 2 (two) times daily. Take only when you take furosemide, Disp: 180 tablet, Rfl: 1   predniSONE (DELTASONE) 20 MG tablet, Take 1 tablet (20 mg total) by mouth 2 (two) times daily with a meal. For 3-5 days prn, Disp: 20 tablet, Rfl: 0   rosuvastatin (CRESTOR) 40 MG tablet, TAKE ONE TABLET BY MOUTH DAILY, Disp: 90 tablet, Rfl: 1   Testosterone 20.25 MG/ACT (1.62%) GEL, Apply 2 each topically daily. 2 pumps daily, Disp: 75 g, Rfl: 5   valsartan (DIOVAN) 320 MG  tablet, TAKE ONE TABLET BY MOUTH DAILY, Disp: 90 tablet, Rfl: 1   verapamil (CALAN-SR) 240 MG CR tablet, TAKE ONE TABLET BY MOUTH EVERY NIGHT AT BEDTIME, Disp: 90 tablet, Rfl: 1  No Known Allergies  I personally reviewed active problem list, medication list, allergies, family history, social history, health maintenance with the patient/caregiver today.   ROS  Constitutional: Negative for fever , positive for mild weight change.  Respiratory: Negative for cough and shortness of breath.   Cardiovascular: Negative for chest pain or palpitations.  Gastrointestinal: Negative for abdominal pain, no bowel changes.  Musculoskeletal: Negative for gait problem or joint swelling.  Skin: Negative for rash.  Neurological: Negative for dizziness or headache.  No other specific complaints in a complete review of systems (except as listed in HPI above).   Objective  Vitals:   01/17/22 0803  BP: (!) 140/72  Pulse: 75  Resp: 16  SpO2: 97%  Weight: 233 lb (105.7 kg)  Height: '5\' 10"'$  (1.778 m)    Body mass index is 33.43 kg/m.  Physical Exam  Constitutional: Patient appears well-developed and well-nourished. Obese  No distress.  HEENT: head atraumatic, normocephalic, pupils equal and reactive to light, neck supple, Cardiovascular: Normal rate, regular rhythm and normal heart sounds.  No murmur heard. Trace BLE edema. Pulmonary/Chest: Effort normal and breath sounds normal. No respiratory  distress. Abdominal: Soft.  There is no tenderness. Psychiatric: Patient has a normal mood and affect. behavior is normal. Judgment and thought content normal.   Recent Results (from the past 2160 hour(s))  Protime-INR     Status: None   Collection Time: 11/23/21  8:18 AM  Result Value Ref Range   Prothrombin Time 12.6 11.4 - 15.2 seconds   INR 1.0 0.8 - 1.2    Comment: (NOTE) INR goal varies based on device and disease states. Performed at [facility], [place] [address], [place], [place] [address]   CBC with Differential/Platelet     Status: Abnormal   Collection Time: 11/23/21  8:18 AM  Result Value Ref Range   WBC 6.2 4.0 - 10.5 K/uL   RBC 4.17 (L) 4.22 - 5.81 MIL/uL   Hemoglobin 13.0 13.0 - 17.0 g/dL   HCT 37.7 (L) 39.0 - 52.0 %   MCV 90.4 80.0 - 100.0 fL   MCH 31.2 26.0 - 34.0 pg   MCHC 34.5 30.0 - 36.0 g/dL   RDW 14.6 11.5 - 15.5 %   Platelets 215 150 - 400 K/uL   nRBC 0.0 0.0 - 0.2 %   Neutrophils Relative % 68 %   Neutro Abs 4.3 1.7 - 7.7 K/uL   Lymphocytes Relative 22 %   Lymphs Abs 1.4 0.7 - 4.0 K/uL   Monocytes Relative 6 %   Monocytes Absolute 0.4 0.1 - 1.0 K/uL   Eosinophils Relative 3 %   Eosinophils Absolute 0.2 0.0 - 0.5 K/uL   Basophils Relative 1 %   Basophils Absolute 0.0 0.0 - 0.1 K/uL   Immature Granulocytes 0 %   Abs Immature Granulocytes 0.02 0.00 - 0.07 K/uL    Comment: Performed at [facility], [place] [address], [place], [place] [address]  Basic metabolic panel     Status: Abnormal   Collection Time: 11/23/21  8:18 AM  Result Value Ref Range   Sodium 143 135 - 145 mmol/L   Potassium 3.9 3.5 - 5.1 mmol/L   Chloride 110 98 - 111 mmol/L   CO2 27 22 - 32 mmol/L  Glucose, Bld 116 (H) 70 - 99 mg/dL    Comment: Glucose reference range applies only to samples taken after fasting for at least 8 hours.   BUN 25 (H) 8 - 23 mg/dL   Creatinine, Ser 0.97 0.61 - 1.24 mg/dL   Calcium 9.4 8.9 - 10.3 mg/dL   GFR,  Estimated >60 >60 mL/min    Comment: (NOTE) Calculated using the CKD-EPI Creatinine Equation (2021)    Anion gap 6 5 - 15    Comment: Performed at [facility], [place] [address], [place], [place] [address]  Type and screen     Status: None   Collection Time: 11/23/21  8:18 AM  Result Value Ref Range   ABO/RH(D) A POS    Antibody Screen NEG    Sample Expiration 12/02/2021,2359    Extend sample reason      NO TRANSFUSIONS OR PREGNANCY IN THE PAST 3 MONTHS Performed at [facility], [place] [address], [place], [place] [address]   Hemoglobin A1c per protocol     Status: Abnormal   Collection Time: 11/23/21  8:18 AM  Result Value Ref Range   Hgb A1c MFr Bld 4.5 (L) 4.8 - 5.6 %    Comment: (NOTE) Pre diabetes:          5.7%-6.4%  Diabetes:              >6.4%  Glycemic control for   <7.0% adults with diabetes    Mean Plasma Glucose 82.45 mg/dL    Comment: Performed at [facility], [place] [address] [place], [place] [address]  ABO/Rh     Status: None   Collection Time: 11/29/21  6:45 AM  Result Value Ref Range   ABO/RH(D)      A POS Performed at [facility], [place] [address] [place], [place] [address]   Surgical pathology     Status: None   Collection Time: 11/29/21 12:55 PM  Result Value Ref Range   SURGICAL PATHOLOGY      Surgical Pathology CASE: [id number] PATIENT: Karion Pae Surgical Pathology Report     Clinical History: Enhancing renal mass concern for RCC (crm)     FINAL MICROSCOPIC DIAGNOSIS:  A. KIDNEY, RIGHT, BIOPSY: Oncocytic neoplasm (size 2.4 cm)  Comment: If this biopsy is representative of the entire lesion, it would be consistent with an oncocytoma. However, renal cell carcinoma may uncommonly show focal areas with oncocytic features.    GROSS DESCRIPTION:  Received in formalin are 2 cores of tan soft tissue measuring 0.6 and 0.7 cm in length, each measuring 0.1 cm in diameter.   The specimen is entirely submitted in 1 block. (KW, 11/29/2021)   Final Diagnosis performed by Casimer Lanius, MD.   Electronically signed 12/01/2021 Technical and / or Professional components performed at [facility], [place] [address] [place], [place] [address].  Immunohistochemistry Technical component (if applicable) was performed at [facility]. [address], [place], [place], [place] [address].   IMMUNOHISTOCHEMISTRY DISCLAIMER (if applicable): Some of these immunohistochemical stains may have been developed and the performance characteristics determine by [facility]. Some may not have been cleared or approved by the U.S. Food and Drug Administration. The FDA has determined that such clearance or approval is not necessary. This test is used for clinical purposes. It should not be regarded as investigational or for research. This laboratory is certified under the [place] (CLIA-88) as  qualified to perform high complexity clinical laboratory testing.  The controls stained appropriately.     PHQ2/9:    01/17/2022    8:03 AM 10/04/2021   11:17 AM 08/01/2021    3:17 PM 07/27/2021   10:24 AM 06/02/2021    8:08 AM  Depression screen PHQ 2/9  Decreased Interest 0 0 0 0 0  Down, Depressed, Hopeless 0 0 0 0 0  PHQ - 2 Score 0 0 0 0 0  Altered sleeping 0  0 0 0  Tired, decreased energy 0  0 0 0  Change in appetite 0  0 0 0  Feeling bad or failure about yourself  0  0 0 0  Trouble concentrating 0  0 0 0  Moving slowly or fidgety/restless 0  0 0 0  Suicidal thoughts 0  0 0 0  PHQ-9 Score 0  0 0 0  Difficult doing work/chores   Not difficult at all      phq 9 is negative   Fall Risk:    01/17/2022    8:03 AM 10/04/2021   11:18 AM 08/01/2021    3:16 PM 07/27/2021   10:27 AM 06/02/2021    8:08 AM  Fall Risk   Falls in the past year? 0 0 0 0 0  Number falls in past yr: 0 0 0 0 0  Injury  with Fall? 0  0 0 0  Risk for fall due to : No Fall Risks Medication side effect No Fall Risks No Fall Risks No Fall Risks  Follow up Falls prevention discussed Falls prevention discussed Falls prevention discussed Falls prevention discussed Falls prevention discussed      Functional Status Survey: Is the patient deaf or have difficulty hearing?: No Does the patient have difficulty seeing, even when wearing glasses/contacts?: No Does the patient have difficulty concentrating, remembering, or making decisions?: No Does the patient have difficulty walking or climbing stairs?: No Does the patient have difficulty dressing or bathing?: No Does the patient have difficulty doing errands alone such as visiting a doctor's office or shopping?: No    Assessment & Plan  1. Benign essential HTN  - COMPLETE METABOLIC PANEL WITH GFR - CBC with Differential/Platelet - Microalbumin / creatinine urine ratio  2. Dyslipidemia  - Lipid panel  3. Atherosclerosis of aorta ([place])  On statin therapy   4. Hypothyroidism, adult  - TSH  5. Primary osteoarthritis of both knees  Stable   6. Vitamin D deficiency   7. Dysmetabolic syndrome   8. Hypogonadism in male  On low dose testosterone  9. Personal history of renal cancer  Will have repeat MRI in Dec   10. Positive for microalbuminuria  - Uric acid  11. Controlled gout  - febuxostat (ULORIC) 40 MG tablet; Take 1 tablet (40 mg total) by mouth daily.  Dispense: 90 tablet; Refill: 1

## 2022-01-17 ENCOUNTER — Ambulatory Visit (INDEPENDENT_AMBULATORY_CARE_PROVIDER_SITE_OTHER): Payer: Medicare Other | Admitting: Family Medicine

## 2022-01-17 ENCOUNTER — Encounter: Payer: Self-pay | Admitting: Family Medicine

## 2022-01-17 VITALS — BP 136/78 | HR 75 | Resp 16 | Ht 70.0 in | Wt 233.0 lb

## 2022-01-17 DIAGNOSIS — E785 Hyperlipidemia, unspecified: Secondary | ICD-10-CM | POA: Diagnosis not present

## 2022-01-17 DIAGNOSIS — E8881 Metabolic syndrome: Secondary | ICD-10-CM

## 2022-01-17 DIAGNOSIS — E291 Testicular hypofunction: Secondary | ICD-10-CM

## 2022-01-17 DIAGNOSIS — M109 Gout, unspecified: Secondary | ICD-10-CM

## 2022-01-17 DIAGNOSIS — E039 Hypothyroidism, unspecified: Secondary | ICD-10-CM

## 2022-01-17 DIAGNOSIS — R809 Proteinuria, unspecified: Secondary | ICD-10-CM

## 2022-01-17 DIAGNOSIS — I700 Atherosclerosis of aorta: Secondary | ICD-10-CM | POA: Insufficient documentation

## 2022-01-17 DIAGNOSIS — M170 Bilateral primary osteoarthritis of knee: Secondary | ICD-10-CM

## 2022-01-17 DIAGNOSIS — I10 Essential (primary) hypertension: Secondary | ICD-10-CM | POA: Diagnosis not present

## 2022-01-17 DIAGNOSIS — Z85528 Personal history of other malignant neoplasm of kidney: Secondary | ICD-10-CM | POA: Insufficient documentation

## 2022-01-17 DIAGNOSIS — E559 Vitamin D deficiency, unspecified: Secondary | ICD-10-CM

## 2022-01-17 HISTORY — DX: Bilateral primary osteoarthritis of knee: M17.0

## 2022-01-17 MED ORDER — FEBUXOSTAT 40 MG PO TABS: 40.0000 mg | ORAL_TABLET | Freq: Every day | ORAL | 1 refills | Status: AC

## 2022-01-18 ENCOUNTER — Other Ambulatory Visit: Payer: Self-pay

## 2022-01-18 DIAGNOSIS — D649 Anemia, unspecified: Secondary | ICD-10-CM

## 2022-01-18 LAB — MICROALBUMIN / CREATININE URINE RATIO
Creatinine, Urine: 43 mg/dL (ref 20–320)
Microalb Creat Ratio: 1321 mcg/mg creat — ABNORMAL HIGH (ref <30)
Microalb, Ur: 56.8 mg/dL

## 2022-01-18 LAB — CBC WITH DIFFERENTIAL/PLATELET
Absolute Monocytes: 449 cells/uL (ref 200–950)
Basophils Absolute: 48 cells/uL (ref 0–200)
Basophils Relative: 0.7 %
Eosinophils Absolute: 152 cells/uL (ref 15–500)
Eosinophils Relative: 2.2 %
HCT: 36 % — ABNORMAL LOW (ref 38.5–50.0)
Hemoglobin: 12.3 g/dL — ABNORMAL LOW (ref 13.2–17.1)
Lymphs Abs: 1290 cells/uL (ref 850–3900)
MCH: 30.4 pg (ref 27.0–33.0)
MCHC: 34.2 g/dL (ref 32.0–36.0)
MCV: 88.9 fL (ref 80.0–100.0)
MPV: 9 fL (ref 7.5–12.5)
Monocytes Relative: 6.5 %
Neutro Abs: 4961 cells/uL (ref 1500–7800)
Neutrophils Relative %: 71.9 %
Platelets: 201 10*3/uL (ref 140–400)
RBC: 4.05 10*6/uL — ABNORMAL LOW (ref 4.20–5.80)
RDW: 13.6 % (ref 11.0–15.0)
Total Lymphocyte: 18.7 %
WBC: 6.9 10*3/uL (ref 3.8–10.8)

## 2022-01-18 LAB — TEST AUTHORIZATION

## 2022-01-18 LAB — COMPLETE METABOLIC PANEL WITH GFR
AG Ratio: 2.2 (calc) (ref 1.0–2.5)
ALT: 28 U/L (ref 9–46)
AST: 24 U/L (ref 10–35)
Albumin: 4.4 g/dL (ref 3.6–5.1)
Alkaline phosphatase (APISO): 92 U/L (ref 35–144)
BUN/Creatinine Ratio: 25 (calc) — ABNORMAL HIGH (ref 6–22)
BUN: 26 mg/dL — ABNORMAL HIGH (ref 7–25)
CO2: 28 mmol/L (ref 20–32)
Calcium: 9.8 mg/dL (ref 8.6–10.3)
Chloride: 106 mmol/L (ref 98–110)
Creat: 1.02 mg/dL (ref 0.70–1.28)
Globulin: 2 g/dL (calc) (ref 1.9–3.7)
Glucose, Bld: 100 mg/dL — ABNORMAL HIGH (ref 65–99)
Potassium: 3.8 mmol/L (ref 3.5–5.3)
Sodium: 142 mmol/L (ref 135–146)
Total Bilirubin: 1 mg/dL (ref 0.2–1.2)
Total Protein: 6.4 g/dL (ref 6.1–8.1)
eGFR: 79 mL/min/{1.73_m2} (ref >=60)

## 2022-01-18 LAB — LIPID PANEL
Cholesterol: 183 mg/dL (ref <200)
HDL: 56 mg/dL (ref >=40)
LDL Cholesterol (Calc): 104 mg/dL (calc) — ABNORMAL HIGH
Non-HDL Cholesterol (Calc): 127 mg/dL (calc) (ref <130)
Total CHOL/HDL Ratio: 3.3 (calc) (ref <5.0)
Triglycerides: 125 mg/dL (ref <150)

## 2022-01-18 LAB — URIC ACID: Uric Acid, Serum: 4.1 mg/dL (ref 4.0–8.0)

## 2022-01-18 LAB — IRON,TIBC AND FERRITIN PANEL
%SAT: 21 % (calc) (ref 20–48)
Ferritin: 79 ng/mL (ref 24–380)
Iron: 75 ug/dL (ref 50–180)
TIBC: 354 mcg/dL (calc) (ref 250–425)

## 2022-01-18 LAB — TSH: TSH: 1.9 mIU/L (ref 0.40–4.50)

## 2022-02-09 ENCOUNTER — Other Ambulatory Visit: Payer: Self-pay | Admitting: Family Medicine

## 2022-02-09 DIAGNOSIS — I10 Essential (primary) hypertension: Secondary | ICD-10-CM

## 2022-02-12 ENCOUNTER — Other Ambulatory Visit: Payer: Self-pay | Admitting: Family Medicine

## 2022-02-12 ENCOUNTER — Other Ambulatory Visit: Payer: Self-pay

## 2022-02-12 ENCOUNTER — Ambulatory Visit (INDEPENDENT_AMBULATORY_CARE_PROVIDER_SITE_OTHER): Payer: Medicare Other | Admitting: Dermatology

## 2022-02-12 ENCOUNTER — Encounter: Payer: Self-pay | Admitting: Dermatology

## 2022-02-12 DIAGNOSIS — E039 Hypothyroidism, unspecified: Secondary | ICD-10-CM

## 2022-02-12 DIAGNOSIS — L304 Erythema intertrigo: Secondary | ICD-10-CM

## 2022-02-12 DIAGNOSIS — Z86018 Personal history of other benign neoplasm: Secondary | ICD-10-CM

## 2022-02-12 DIAGNOSIS — L719 Rosacea, unspecified: Secondary | ICD-10-CM | POA: Diagnosis not present

## 2022-02-12 DIAGNOSIS — L814 Other melanin hyperpigmentation: Secondary | ICD-10-CM

## 2022-02-12 DIAGNOSIS — D1800 Hemangioma unspecified site: Secondary | ICD-10-CM

## 2022-02-12 DIAGNOSIS — D239 Other benign neoplasm of skin, unspecified: Secondary | ICD-10-CM

## 2022-02-12 DIAGNOSIS — D229 Melanocytic nevi, unspecified: Secondary | ICD-10-CM

## 2022-02-12 DIAGNOSIS — L578 Other skin changes due to chronic exposure to nonionizing radiation: Secondary | ICD-10-CM | POA: Diagnosis not present

## 2022-02-12 DIAGNOSIS — Z1283 Encounter for screening for malignant neoplasm of skin: Secondary | ICD-10-CM

## 2022-02-12 DIAGNOSIS — L821 Other seborrheic keratosis: Secondary | ICD-10-CM

## 2022-02-12 DIAGNOSIS — L918 Other hypertrophic disorders of the skin: Secondary | ICD-10-CM

## 2022-02-12 MED ORDER — LEVOTHYROXINE SODIUM 150 MCG PO TABS: 150.0000 ug | ORAL_TABLET | Freq: Every day | ORAL | 1 refills | Status: DC

## 2022-02-12 NOTE — Progress Notes (Signed)
Follow-Up Visit   Subjective  Alan Campbell is a 71 y.o. male who presents for the following: Annual Exam (1 year tbse, hx of isks. Reports a skin irritated at abdomen he has had for over year now. Would like to discuss treatment. ). The patient presents for Total-Body Skin Exam (TBSE) for skin cancer screening and mole check.  The patient has spots, moles and lesions to be evaluated, some may be new or changing and the patient has concerns that these could be cancer.  The following portions of the chart were reviewed this encounter and updated as appropriate:  Tobacco  Allergies  Meds  Problems  Med Hx  Surg Hx  Fam Hx     Review of Systems: No other skin or systemic complaints except as noted in HPI or Assessment and Plan.  Objective  Well appearing patient in no apparent distress; mood and affect are within normal limits.  A full examination was performed including scalp, head, eyes, ears, nose, lips, neck, chest, axillae, abdomen, back, buttocks, bilateral upper extremities, bilateral lower extremities, hands, feet, fingers, toes, fingernails, and toenails. All findings within normal limits unless otherwise noted below.  Infrabdominal area Erythematous rash        mid face and nose Dilated vessels in face and nose and thickened skin at nose   Assessment & Plan  Erythema intertrigo Infrabdominal area Intertrigo is a chronic recurrent rash that occurs in skin fold areas that may be associated with friction; heat; moisture; yeast; fungus; and bacteria.  It is exacerbated by increased movement / activity; sweating; and higher atmospheric temperature.  Discussed treatment  Start [facility] Iodoquinol 1%, Hydrocortisone 2.5%, Niacinamide 2% Cream twice a day to affected areas for up to two weeks.  The patient was advised this is not covered by insurance since it is made by a compounding pharmacy. They will receive an email to check out and the medication will be mailed  to their home.   Instructions for [facility] Medications One or more of your medications was sent to the [facility] mail order compounding pharmacy. You will receive an email from them and can purchase the medicine through that link. It will then be mailed to your home at the address you confirmed. If for any reason you do not receive an email from them, please check your spam folder. If you still do not find the email, please let us know. [facility] phone number is [phone].  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 It is treatable but not curable. It sometimes affects the eyes (ocular rosacea) as well. It may respond to topical and/or systemic medication and can flare with stress, sun exposure, alcohol, exercise and some foods.  Daily application of broad spectrum spf 30+ sunscreen to face is recommended to reduce flares.  Discussed treatment Patient deferred treatment at this time.  Lentigines - Scattered tan macules - Due to sun exposure - Benign-appearing, observe - Recommend daily broad spectrum sunscreen SPF 30+ to sun-exposed areas, reapply every 2 hours as needed. - Call for any changes  Seborrheic Keratoses - Stuck-on, waxy, tan-brown papules and/or plaques  - Benign-appearing - Discussed benign etiology and prognosis. - Observe - Call for any changes  Acrochordons (Skin Tags) Axillary area  - Fleshy, skin-colored  pedunculated papules - Benign appearing.  - Observe. - If desired, they can be removed with an in office procedure that is not covered by insurance. - Please call the clinic if you notice any new or changing lesions.  Melanocytic Nevi - Tan-brown and/or pink-flesh-colored symmetric macules and papules - Benign appearing on exam today -  Observation - Call clinic for new or changing moles - Recommend daily use of broad spectrum spf 30+ sunscreen to sun-exposed areas.   Hemangiomas - Red papules - Discussed benign nature - Observe - Call for any changes  Actinic Damage - Chronic condition, secondary to cumulative UV/sun exposure - diffuse scaly erythematous macules with underlying dyspigmentation - Recommend daily broad spectrum sunscreen SPF 30+ to sun-exposed areas, reapply every 2 hours as needed.  - Staying in the shade or wearing long sleeves, sun glasses (UVA+UVB protection) and wide brim hats (4-inch brim around the entire circumference of the hat) are also recommended for sun protection.  - Call for new or changing lesions.  History of Dysplastic Nevi Left low back 2011 - No evidence of recurrence today - Recommend regular full body skin exams - Recommend daily broad spectrum sunscreen SPF 30+ to sun-exposed areas, reapply every 2 hours as needed.  - Call if any new or changing lesions are noted between office visits  Skin cancer screening performed today. Return in about 1 year (around 02/13/2023) for TBSE. IRuthell Rummage, CMA, am acting as scribe for Sarina Ser, MD. Documentation: I have reviewed the above documentation for accuracy and completeness, and I agree with the above.  Sarina Ser, MD

## 2022-02-12 NOTE — Telephone Encounter (Signed)
Last seen 8/2

## 2022-02-12 NOTE — Patient Instructions (Addendum)
Intertrigo is a chronic recurrent rash that occurs in skin fold areas that may be associated with friction; heat; moisture; yeast; fungus; and bacteria.  It is exacerbated by increased movement / activity; sweating; and higher atmospheric temperature.  Start [facility] Iodoquinol 1%, Hydrocortisone 2.5%, Niacinamide 2% Cream twice a day to affected areas for up to two weeks.  The patient was advised this is not covered by insurance since it is made by a compounding pharmacy. They will receive an email to check out and the medication will be mailed to their home.   Instructions for [facility] Medications  One or more of your medications was sent to the [facility] mail order compounding pharmacy. You will receive an email from them and can purchase the medicine through that link. It will then be mailed to your home at the address you confirmed. If for any reason you do not receive an email from them, please check your spam folder. If you still do not find the email, please let us know. [facility] phone number is [phone].  Start Zeasorb Af powder once rash has cleared to help prevent reoccurance. Can be found in antifungal section at local pharmacy      Seborrheic Keratosis  What causes seborrheic keratoses? Seborrheic keratoses are harmless, common skin growths that first appear during adult life.  As time goes by, more growths appear.  Some people may develop a large number of them.  Seborrheic keratoses appear on both covered and uncovered body parts.  They are not caused by sunlight.  The tendency to develop seborrheic keratoses can be inherited.  They vary in color from skin-colored to gray, brown, or even black.  They can be either smooth or have a rough, warty surface.   Seborrheic keratoses are superficial and look as if they were stuck on the skin.  Under the microscope this type of keratosis looks like layers upon layers of skin.  That is why at times the top layer  may seem to fall off, but the rest of the growth remains and re-grows.    Treatment Seborrheic keratoses do not need to be treated, but can easily be removed in the office.  Seborrheic keratoses often cause symptoms when they rub on clothing or jewelry.  Lesions can be in the way of shaving.  If they become inflamed, they can cause itching, soreness, or burning.  Removal of a seborrheic keratosis can be accomplished by freezing, burning, or surgery. If any spot bleeds, scabs, or grows rapidly, please return to have it checked, as these can be an indication of a skin cancer.  Melanoma ABCDEs  Melanoma is the most dangerous type of skin cancer, and is the leading cause of death from skin disease.  You are more likely to develop melanoma if you: Have light-colored skin, light-colored eyes, or red or blond hair Spend a lot of time in the sun Tan regularly, either outdoors or in a tanning bed Have had blistering sunburns, especially during childhood Have a close family member who has had a melanoma Have atypical moles or large birthmarks  Early detection of melanoma is key since treatment is typically straightforward and cure rates are extremely high if we catch it early.   The first sign of melanoma is often a change in a mole or a new dark spot.  The ABCDE system is a way of remembering the signs of melanoma.  A for asymmetry:  The two halves do not match. B for border:  The edges of the growth are irregular. C for color:  A mixture of colors are present instead of an even brown color. D for diameter:  Melanomas are usually (but not always) greater than 73m - the size of a pencil eraser. E for evolution:  The spot keeps changing in size, shape, and color.  Please check your skin once per month between visits. You can use a small mirror in front and a large mirror behind you to keep an eye on the back side or your body.   If you see any new or changing lesions before your next follow-up,  please call to schedule a visit.  Please continue daily skin protection including broad spectrum sunscreen SPF 30+ to sun-exposed areas, reapplying every 2 hours as needed when you're outdoors.   Staying in the shade or wearing long sleeves, sun glasses (UVA+UVB protection) and wide brim hats (4-inch brim around the entire circumference of the hat) are also recommended for sun protection.    Due to recent changes in healthcare laws, you may see results of your pathology and/or laboratory studies on MyChart before the doctors have had a chance to review them. We understand that in some cases there may be results that are confusing or concerning to you. Please understand that not all results are received at the same time and often the doctors may need to interpret multiple results in order to provide you with the best plan of care or course of treatment. Therefore, we ask that you please give u[place]2 business days to thoroughly review all your results before contacting the office for clarification. Should we see a critical lab result, you will be contacted sooner.   If You Need Anything After Your Visit  If you have any questions or concerns for your doctor, please call our main line at [phone] press option 4 to reach your doctor's medical assistant. If no one answers, please leave a voicemail as directed and we will return your call as soon as possible. Messages left after 4 pm will be answered the following business day.   You may also send [place] message via [place] We typically respond to MyChart messages within 1-2 business days.  For prescription refills, please ask your pharmacy to contact our office. Our fax number is [phone]  If you have an urgent issue when the clinic is closed that cannot wait until the next business day, you can page your doctor at the number below.    Please note that while we do our best to be available for urgent issues outside of office hours, we are not  available 24/7.   If you have an urgent issue and are unable to reach u[place] you may choose to seek medical care at your doctor's office, retail clinic, urgent care center, or emergency room.  If you have a medical emergency, please immediately call 911 or go to the emergency department.  Pager Numbers  - Dr. KNehemiah Massed [phone] - Dr. MLaurence Ferrari [phone] - Dr. SNicole Kindred [phone] In the event of inclement weather, please call our main line at [phone] an update on the status of any delays or closures.  Dermatology Medication Tips: Please keep the boxes that topical medications come in in order to help keep track of the instructions about where and how to use these. Pharmacies typically print the medication instructions only on the boxes and not directly on the medication tubes.   If your medication is too expensive,  please contact our office at [phone] option 4 or send [place] a message through [place].   We are unable to tell what your co-pay for medications will be in advance as this is different depending on your insurance coverage. However, we may be able to find a substitute medication at lower cost or fill out paperwork to get insurance to cover a needed medication.   If a prior authorization is required to get your medication covered by your insurance company, please allow [place] 1-2 business days to complete this process.  Drug prices often vary depending on where the prescription is filled and some pharmacies may offer cheaper prices.  The website www.goodrx.com contains coupons for medications through different pharmacies. The prices here do not account for what the cost may be with help from insurance (it may be cheaper with your insurance), but the website can give you the price if you did not use any insurance.  - You can print the associated coupon and take it with your prescription to the pharmacy.  - You may also stop by our office during regular business hours  and pick up a GoodRx coupon card.  - If you need your prescription sent electronically to a different pharmacy, notify our office through [facility] or by phone at [phone] option 4.     Si Usted Necesita Algo Despus de Su Visita  Tambin puede enviarnos un mensaje a travs de Pharmacist, community. Por lo general respondemos a los mensajes de MyChart en el transcurso de 1 a 2 das hbiles.  Para renovar recetas, por favor pida a su farmacia que se ponga en contacto con nuestra oficina. Harland Dingwall de fax es [place] [phone].  Si tiene un asunto urgente cuando la clnica est cerrada y que no puede esperar hasta el siguiente da hbil, puede llamar/localizar a su doctor(a) al nmero que aparece a continuacin.   Por favor, tenga en cuenta que aunque hacemos todo lo posible para estar disponibles para asuntos urgentes fuera del horario de [place], no estamos disponibles las 24 horas del da, los 7 das de la [place].   Si tiene un problema urgente y no puede comunicarse con nosotros, puede optar por buscar atencin mdica  en el consultorio de su doctor(a), en una clnica privada, en un centro de atencin urgente o en una sala de emergencias.  Si tiene Engineering geologist, por favor llame inmediatamente al 911 o vaya a la sala de emergencias.  Nmeros de bper  - Dr. Nehemiah Massed: [phone]  - Dra. Moye: [phone]  - Dra. Nicole Kindred: [phone]  En caso de inclemencias del [place], por favor llame a Johnsie Kindred principal al [phone] para una actualizacin sobre el [place] de cualquier retraso o cierre.  Consejos para la medicacin en dermatologa: Por favor, guarde las cajas en las que vienen los medicamentos de uso tpico para ayudarle a seguir las instrucciones sobre dnde y cmo usarlos. Las farmacias generalmente imprimen las instrucciones del medicamento slo en las cajas y no directamente en los tubos del [place].   Si su medicamento es muy caro, por favor, pngase  en contacto con Zigmund Daniel llamando al [phone] y presione la opcin 4 o envenos un mensaje a travs de Pharmacist, community.   No podemos decirle cul ser su copago por los medicamentos por adelantado ya que esto es diferente dependiendo de la cobertura de su seguro. Sin embargo, es posible que podamos encontrar un medicamento sustituto a Electrical engineer un formulario para que el [place]  el medicamento que se considera necesario.   Si se requiere una autorizacin previa para que su compaa de seguros [place] su medicamento, por favor permtanos de 1 a 2 das hbiles para completar este proceso.  Los precios de los medicamentos varan con frecuencia dependiendo del Environmental consultant de dnde se surte la receta y alguna farmacias pueden ofrecer precios ms baratos.  El sitio web www.goodrx.com tiene cupones para medicamentos de Airline pilot. Los precios aqu no tienen en cuenta lo que podra costar con la ayuda del seguro (puede ser ms barato con su seguro), pero el sitio web puede darle el precio si no utiliz Research scientist (physical sciences).  - Puede imprimir el cupn correspondiente y llevarlo con su receta a la farmacia.  - Tambin puede pasar por nuestra oficina durante el horario de atencin regular y Charity fundraiser una tarjeta de cupones de GoodRx.  - Si necesita que su receta se enve electrnicamente a una farmacia diferente, informe a nuestra oficina a travs de MyChart de [place] o por telfono llamando al [phone] y presione la opcin 4.

## 2022-04-03 ENCOUNTER — Other Ambulatory Visit: Payer: Self-pay | Admitting: Family Medicine

## 2022-04-03 DIAGNOSIS — I10 Essential (primary) hypertension: Secondary | ICD-10-CM

## 2022-04-14 ENCOUNTER — Other Ambulatory Visit: Payer: Self-pay | Admitting: Family Medicine

## 2022-04-14 DIAGNOSIS — Z79899 Other long term (current) drug therapy: Secondary | ICD-10-CM

## 2022-04-14 DIAGNOSIS — I10 Essential (primary) hypertension: Secondary | ICD-10-CM

## 2022-04-16 ENCOUNTER — Encounter (INDEPENDENT_AMBULATORY_CARE_PROVIDER_SITE_OTHER): Payer: Self-pay

## 2022-05-01 ENCOUNTER — Other Ambulatory Visit: Payer: Self-pay | Admitting: Interventional Radiology

## 2022-05-01 DIAGNOSIS — N2889 Other specified disorders of kidney and ureter: Secondary | ICD-10-CM

## 2022-05-22 ENCOUNTER — Ambulatory Visit: Admission: RE | Admit: 2022-05-22 | Payer: Medicare Other | Source: Ambulatory Visit

## 2022-05-22 ENCOUNTER — Other Ambulatory Visit: Payer: Self-pay

## 2022-05-22 DIAGNOSIS — E785 Hyperlipidemia, unspecified: Secondary | ICD-10-CM

## 2022-05-22 MED ORDER — ROSUVASTATIN CALCIUM 40 MG PO TABS: 40.0000 mg | ORAL_TABLET | Freq: Every day | ORAL | 1 refills | Status: AC

## 2022-05-29 ENCOUNTER — Ambulatory Visit
Admission: RE | Admit: 2022-05-29 | Discharge: 2022-05-29 | Disposition: A | Discharge status: Home / Self Care | Payer: Medicare Other | Source: Ambulatory Visit | Attending: Interventional Radiology | Admitting: Interventional Radiology

## 2022-05-29 ENCOUNTER — Other Ambulatory Visit: Payer: Medicare Other

## 2022-05-29 DIAGNOSIS — N2889 Other specified disorders of kidney and ureter: Secondary | ICD-10-CM

## 2022-06-04 ENCOUNTER — Ambulatory Visit: Payer: Self-pay

## 2022-06-04 ENCOUNTER — Inpatient Hospital Stay
Admission: EM | Admit: 2022-06-04 | Discharge: 2022-06-07 | DRG: 229 | Disposition: A | Discharge status: Home / Self Care | Payer: Medicare Other | Attending: Student | Admitting: Student

## 2022-06-04 ENCOUNTER — Ambulatory Visit (INDEPENDENT_AMBULATORY_CARE_PROVIDER_SITE_OTHER): Payer: Medicare Other | Admitting: Internal Medicine

## 2022-06-04 ENCOUNTER — Telehealth: Payer: Self-pay | Admitting: Family Medicine

## 2022-06-04 ENCOUNTER — Emergency Department: Payer: Medicare Other

## 2022-06-04 ENCOUNTER — Other Ambulatory Visit: Payer: Self-pay

## 2022-06-04 ENCOUNTER — Encounter: Payer: Self-pay | Admitting: Internal Medicine

## 2022-06-04 VITALS — BP 136/62 | HR 40 | Temp 97.8°F | Resp 16 | Ht 70.0 in | Wt 242.6 lb

## 2022-06-04 DIAGNOSIS — R001 Bradycardia, unspecified: Secondary | ICD-10-CM

## 2022-06-04 DIAGNOSIS — I890 Lymphedema, not elsewhere classified: Secondary | ICD-10-CM | POA: Diagnosis not present

## 2022-06-04 DIAGNOSIS — R0602 Shortness of breath: Secondary | ICD-10-CM

## 2022-06-04 DIAGNOSIS — I110 Hypertensive heart disease with heart failure: Secondary | ICD-10-CM | POA: Diagnosis present

## 2022-06-04 DIAGNOSIS — I350 Nonrheumatic aortic (valve) stenosis: Secondary | ICD-10-CM

## 2022-06-04 DIAGNOSIS — E669 Obesity, unspecified: Secondary | ICD-10-CM | POA: Diagnosis present

## 2022-06-04 DIAGNOSIS — R079 Chest pain, unspecified: Secondary | ICD-10-CM | POA: Diagnosis not present

## 2022-06-04 DIAGNOSIS — Z7982 Long term (current) use of aspirin: Secondary | ICD-10-CM

## 2022-06-04 DIAGNOSIS — Z6834 Body mass index (BMI) 34.0-34.9, adult: Secondary | ICD-10-CM

## 2022-06-04 DIAGNOSIS — Z538 Procedure and treatment not carried out for other reasons: Secondary | ICD-10-CM | POA: Diagnosis present

## 2022-06-04 DIAGNOSIS — R011 Cardiac murmur, unspecified: Secondary | ICD-10-CM

## 2022-06-04 DIAGNOSIS — Z87891 Personal history of nicotine dependence: Secondary | ICD-10-CM

## 2022-06-04 DIAGNOSIS — I441 Atrioventricular block, second degree: Principal | ICD-10-CM | POA: Diagnosis present

## 2022-06-04 DIAGNOSIS — E785 Hyperlipidemia, unspecified: Secondary | ICD-10-CM | POA: Diagnosis present

## 2022-06-04 DIAGNOSIS — I5032 Chronic diastolic (congestive) heart failure: Secondary | ICD-10-CM | POA: Diagnosis present

## 2022-06-04 DIAGNOSIS — J069 Acute upper respiratory infection, unspecified: Secondary | ICD-10-CM

## 2022-06-04 DIAGNOSIS — I3139 Other pericardial effusion (noninflammatory): Secondary | ICD-10-CM | POA: Diagnosis present

## 2022-06-04 DIAGNOSIS — M810 Age-related osteoporosis without current pathological fracture: Secondary | ICD-10-CM | POA: Diagnosis present

## 2022-06-04 DIAGNOSIS — G4733 Obstructive sleep apnea (adult) (pediatric): Secondary | ICD-10-CM | POA: Diagnosis present

## 2022-06-04 DIAGNOSIS — R059 Cough, unspecified: Secondary | ICD-10-CM | POA: Diagnosis not present

## 2022-06-04 DIAGNOSIS — Z006 Encounter for examination for normal comparison and control in clinical research program: Secondary | ICD-10-CM

## 2022-06-04 DIAGNOSIS — I083 Combined rheumatic disorders of mitral, aortic and tricuspid valves: Secondary | ICD-10-CM | POA: Diagnosis present

## 2022-06-04 DIAGNOSIS — Z79890 Hormone replacement therapy (postmenopausal): Secondary | ICD-10-CM

## 2022-06-04 DIAGNOSIS — E039 Hypothyroidism, unspecified: Secondary | ICD-10-CM

## 2022-06-04 DIAGNOSIS — I10 Essential (primary) hypertension: Secondary | ICD-10-CM

## 2022-06-04 DIAGNOSIS — Z1152 Encounter for screening for COVID-19: Secondary | ICD-10-CM

## 2022-06-04 DIAGNOSIS — Z8349 Family history of other endocrine, nutritional and metabolic diseases: Secondary | ICD-10-CM

## 2022-06-04 DIAGNOSIS — I2489 Other forms of acute ischemic heart disease: Secondary | ICD-10-CM | POA: Diagnosis present

## 2022-06-04 DIAGNOSIS — Z8249 Family history of ischemic heart disease and other diseases of the circulatory system: Secondary | ICD-10-CM

## 2022-06-04 DIAGNOSIS — Z79899 Other long term (current) drug therapy: Secondary | ICD-10-CM

## 2022-06-04 HISTORY — DX: Cardiac murmur, unspecified: R01.1

## 2022-06-04 LAB — BASIC METABOLIC PANEL
Anion gap: 7 (ref 5–15)
BUN: 22 mg/dL (ref 8–23)
CO2: 24 mmol/L (ref 22–32)
Calcium: 9 mg/dL (ref 8.9–10.3)
Chloride: 111 mmol/L (ref 98–111)
Creatinine, Ser: 1.08 mg/dL (ref 0.61–1.24)
GFR, Estimated: >60 mL/min (ref >60)
Glucose, Bld: 104 mg/dL — ABNORMAL HIGH (ref 70–99)
Potassium: 3.6 mmol/L (ref 3.5–5.1)
Sodium: 142 mmol/L (ref 135–145)

## 2022-06-04 LAB — RESPIRATORY PANEL BY PCR

## 2022-06-04 LAB — TROPONIN I (HIGH SENSITIVITY)
Troponin I (High Sensitivity): 56 ng/L — ABNORMAL HIGH (ref <18)
Troponin I (High Sensitivity): 62 ng/L — ABNORMAL HIGH (ref <18)

## 2022-06-04 LAB — MAGNESIUM: Magnesium: 2.2 mg/dL (ref 1.7–2.4)

## 2022-06-04 LAB — CBC
HCT: 40.3 % (ref 39.0–52.0)
Hemoglobin: 13.2 g/dL (ref 13.0–17.0)
MCH: 30.1 pg (ref 26.0–34.0)
MCHC: 32.8 g/dL (ref 30.0–36.0)
MCV: 92 fL (ref 80.0–100.0)
Platelets: 195 10*3/uL (ref 150–400)
RBC: 4.38 MIL/uL (ref 4.22–5.81)
RDW: 14.8 % (ref 11.5–15.5)
WBC: 9.5 10*3/uL (ref 4.0–10.5)
nRBC: 0 % (ref 0.0–0.2)

## 2022-06-04 LAB — POCT INFLUENZA A/B
Influenza A, POC: NEGATIVE
Influenza B, POC: NEGATIVE

## 2022-06-04 LAB — RESP PANEL BY RT-PCR (RSV, FLU A&B, COVID)  RVPGX2
Influenza A by PCR: NEGATIVE
Influenza B by PCR: NEGATIVE
Resp Syncytial Virus by PCR: NEGATIVE
SARS Coronavirus 2 by RT PCR: NEGATIVE

## 2022-06-04 LAB — TSH: TSH: 5.522 u[IU]/mL — ABNORMAL HIGH (ref 0.350–4.500)

## 2022-06-04 LAB — BRAIN NATRIURETIC PEPTIDE: B Natriuretic Peptide: 559.1 pg/mL — ABNORMAL HIGH (ref 0.0–100.0)

## 2022-06-04 MED ORDER — POTASSIUM CHLORIDE CRYS ER 20 MEQ PO TBCR
20.0000 meq | EXTENDED_RELEASE_TABLET | Freq: Every day | ORAL | Status: DC
  Administered 2022-06-05 – 2022-06-07 (×3): 20 meq via ORAL
  Filled 2022-06-04 (×3): qty 1

## 2022-06-04 MED ORDER — HYDRALAZINE HCL 20 MG/ML IJ SOLN
10.0000 mg | Freq: Four times a day (QID) | INTRAMUSCULAR | Status: DC | PRN
  Administered 2022-06-04 – 2022-06-06 (×3): 10 mg via INTRAVENOUS
  Filled 2022-06-04 (×4): qty 1

## 2022-06-04 MED ORDER — IRBESARTAN 75 MG PO TABS
75.0000 mg | ORAL_TABLET | Freq: Every day | ORAL | Status: DC
  Administered 2022-06-04: 75 mg via ORAL
  Filled 2022-06-04 (×2): qty 1

## 2022-06-04 MED ORDER — ACETAMINOPHEN 650 MG RE SUPP: 650.0000 mg | Freq: Four times a day (QID) | RECTAL | Status: DC | PRN

## 2022-06-04 MED ORDER — ASPIRIN 81 MG PO TBEC: 81.0000 mg | DELAYED_RELEASE_TABLET | Freq: Every day | ORAL | Status: DC

## 2022-06-04 MED ORDER — ACETAMINOPHEN 325 MG PO TABS
650.0000 mg | ORAL_TABLET | Freq: Four times a day (QID) | ORAL | Status: DC | PRN
  Filled 2022-06-04: qty 2

## 2022-06-04 MED ORDER — LEVOTHYROXINE SODIUM 50 MCG PO TABS
150.0000 ug | ORAL_TABLET | Freq: Every day | ORAL | Status: DC
  Administered 2022-06-05 – 2022-06-07 (×3): 150 ug via ORAL
  Filled 2022-06-04: qty 3
  Filled 2022-06-04 (×2): qty 1

## 2022-06-04 MED ORDER — ASPIRIN 81 MG PO TBEC
81.0000 mg | DELAYED_RELEASE_TABLET | Freq: Every day | ORAL | Status: DC
  Administered 2022-06-04 – 2022-06-07 (×4): 81 mg via ORAL
  Filled 2022-06-04 (×4): qty 1

## 2022-06-04 MED ORDER — FEBUXOSTAT 40 MG PO TABS
40.0000 mg | ORAL_TABLET | Freq: Every day | ORAL | Status: DC
  Administered 2022-06-05 – 2022-06-06 (×2): 40 mg via ORAL
  Filled 2022-06-04 (×5): qty 1

## 2022-06-04 MED ORDER — ONDANSETRON HCL 4 MG PO TABS: 4.0000 mg | ORAL_TABLET | Freq: Four times a day (QID) | ORAL | Status: DC | PRN

## 2022-06-04 MED ORDER — ENOXAPARIN SODIUM 60 MG/0.6ML IJ SOSY
0.5000 mg/kg | PREFILLED_SYRINGE | INTRAMUSCULAR | Status: DC
  Administered 2022-06-04 – 2022-06-06 (×3): 57.5 mg via SUBCUTANEOUS
  Filled 2022-06-04 (×3): qty 0.6

## 2022-06-04 MED ORDER — POLYETHYLENE GLYCOL 3350 17 G PO PACK: 17.0000 g | PACK | Freq: Every day | ORAL | Status: DC | PRN

## 2022-06-04 MED ORDER — ONDANSETRON HCL 4 MG/2ML IJ SOLN: 4.0000 mg | Freq: Four times a day (QID) | INTRAMUSCULAR | Status: DC | PRN

## 2022-06-04 MED ORDER — ADULT MULTIVITAMIN W/MINERALS CH
1.0000 | ORAL_TABLET | Freq: Every day | ORAL | Status: DC
  Administered 2022-06-04 – 2022-06-07 (×4): 1 via ORAL
  Filled 2022-06-04 (×4): qty 1

## 2022-06-04 MED ORDER — ROSUVASTATIN CALCIUM 10 MG PO TABS
40.0000 mg | ORAL_TABLET | Freq: Every day | ORAL | Status: DC
  Administered 2022-06-05 – 2022-06-06 (×2): 40 mg via ORAL
  Filled 2022-06-04: qty 2
  Filled 2022-06-04: qty 4
  Filled 2022-06-04: qty 2
  Filled 2022-06-04 (×2): qty 4

## 2022-06-04 MED ORDER — SODIUM CHLORIDE 0.9% FLUSH
3.0000 mL | Freq: Two times a day (BID) | INTRAVENOUS | Status: DC
  Administered 2022-06-04 – 2022-06-07 (×6): 3 mL via INTRAVENOUS

## 2022-06-04 NOTE — Assessment & Plan Note (Signed)
Patient presenting to his PCPs office today with URI symptoms found to be bradycardic as low as 26.  EKG with Mobitz type II AV block.  Differential of etiology includes medication induced (patient on verapamil, last dose last night) versus viral infection versus structural heart disease (although less likely).  Echocardiogram obtained in 2019 with no significant structural disease though. Troponin minimally elevated likely due to demand rather than ACS, as patient is only experiencing pleuritic chest pain. No prior history of CAD with normal stress test in 2019.   At this time, patient remains hemodynamically stable.  - Cardiology consulted; appreciate their recommendations - Keep pacer pads at bedside - Can try atropine if there are any changes in hemodynamic status, however patient has been informed that pacer pads may need to be used in that case - RVP pending - Echocardiogram ordered

## 2022-06-04 NOTE — Progress Notes (Addendum)
   Acute Office Visit  Subjective:     Patient ID: Alan Campbell, male    DOB: 08/12/1950, 71 y.o.   [name]: [id number]  Chief Complaint  Patient presents with   URI    cough    HPI Patient is in today for cough x 1 day. He also has a headache, shortness of breath. Symptoms all started yesterday. He is also having substernal chest pain and generalized myalgias. He has not had any recent medication changes. He has only taken his Levothyroxine today, he last took all of this other medications yesterday evening. He denies lightheadedness, dizziness currently. He did have an episode of lightheadedness that almost caused a fall 1 month ago when he was lifting above his head. No fevers. He took a rapid home COVID earlier this morning that was negative.    Review of Systems  Constitutional:  Negative for chills and fever.  Eyes:  Negative for blurred vision.  Respiratory:  Positive for cough and shortness of breath.   Cardiovascular:  Positive for chest pain. Negative for palpitations.        Objective:    BP 136/62   Pulse (!) 40   Temp 97.8 F (36.6 C)   Resp 16   Ht '5\' 10"'$  (1.778 m)   Wt 242 lb 9.6 oz (110 kg)   SpO2 96%   BMI 34.81 kg/m  BP Readings from Last 3 Encounters:  06/04/22 136/62  01/17/22 136/78  12/28/21 (!) 173/85   Wt Readings from Last 3 Encounters:  06/04/22 242 lb 9.6 oz (110 kg)  01/17/22 233 lb (105.7 kg)  12/28/21 233 lb (105.7 kg)      Physical Exam Constitutional:      Appearance: Normal appearance.  HENT:     Head: Normocephalic and atraumatic.  Eyes:     Conjunctiva/sclera: Conjunctivae normal.  Cardiovascular:     Rate and Rhythm: Regular rhythm. Bradycardia present.  Neurological:     General: No focal deficit present.     Mental Status: He is alert. Mental status is at baseline.  Psychiatric:        Mood and Affect: Mood normal.        Behavior: Behavior normal.     No results found for any visits on 06/04/22.       Assessment & Plan:   1. Bradycardia/Chest pain, unspecified type/SOB (shortness of breath)/Cough, unspecified type: Pulse initially 30 here, best was 40. EKG with inconsistent p waves, what appears to be PR elongation in lead I and new right bundle branch block as well as ST wave changes, all new changes compared to EKG from June. Based on symptoms and changed EKG patient will present to the ER for further evaluation. COVID test performed here, flu negative. Offered to call EMS for the patient, he politely declined.   - EKG 12-Lead - POCT Influenza A/B - Novel Coronavirus, NAA ([facility])  Return if symptoms worsen or fail to improve.  Teodora Medici, DO

## 2022-06-04 NOTE — Assessment & Plan Note (Signed)
TSH within normal limits when corrected for patient's age.  - Continue home levothyroxine

## 2022-06-04 NOTE — ED Provider Notes (Signed)
[facility] Provider Note    Event Date/Time   First MD Initiated Contact with Patient 06/04/22 1238     (approximate)   History   Chief Complaint: Cough   HPI  Alan Campbell is a 71 y.o. male with a history of hypertension, obesity who comes ED complaining of nonproductive cough for the past few days, associated with shortness of breath with exertion, dizziness with standing, and profound fatigue.  He went to his PCP today where EKG showed bradycardia and he was sent to the ED for evaluation.  He takes verapamil once daily, last dose yesterday evening.  Denies beta-blocker use.  Denies chest pain.  No syncope.     Physical Exam   Triage Vital Signs: ED Triage Vitals [06/04/22 1231]  Enc Vitals Group     BP (!) 122/97     Pulse Rate (!) 26     Resp 19     Temp 99.1 F (37.3 C)     Temp Source Oral     SpO2 98 %     Weight 236 lb (107 kg)     Height      Head Circumference      Peak Flow      Pain Score 0     Pain Loc      Pain Edu?      Excl. in [place]?     Most recent vital signs: Vitals:   06/04/22 1231  BP: (!) 122/97  Pulse: (!) 26  Resp: 19  Temp: 99.1 F (37.3 C)  SpO2: 98%    General: Awake, no distress.  CV:  Good peripheral perfusion.  Bradycardia, heart rate about 35.  Normal distal pulses Resp:  Normal effort.  Clear to auscultation bilaterally Abd:  No distention.  Soft nontender Other:  No lower extremity edema or calf tenderness.  Normal mental status.   ED Results / Procedures / Treatments   Labs (all labs ordered are listed, but only abnormal results are displayed) Labs Reviewed  RESP PANEL BY RT-PCR (RSV, FLU A&B, COVID)  RVPGX2  CBC  BASIC METABOLIC PANEL  MAGNESIUM  TSH  TROPONIN I (HIGH SENSITIVITY)     EKG Interpreted by me Sinus bradycardia with Mobitz 2 second-degree AV block, rate of 39, right axis, normal intervals.  Left bundle branch block.  Normal ST segments.  Lateral T wave  inversions.   RADIOLOGY Chest x-ray interpreted by me, appears normal.  Radiology report reviewed   PROCEDURES:  .Critical Care  Performed by: Carrie Mew, MD Authorized by: Carrie Mew, MD   Critical care provider statement:    Critical care time (minutes):  35   Critical care time was exclusive of:  Separately billable procedures and treating other patients   Critical care was necessary to treat or prevent imminent or life-threatening deterioration of the following conditions:  Cardiac failure, circulatory failure and shock   Critical care was time spent personally by me on the following activities:  Development of treatment plan with patient or surrogate, discussions with consultants, evaluation of patient's response to treatment, examination of patient, obtaining history from patient or surrogate, ordering and performing treatments and interventions, ordering and review of laboratory studies, ordering and review of radiographic studies, pulse oximetry, re-evaluation of patient's condition and review of old charts   Care discussed with: admitting provider   Comments:       .1-3 Lead EKG Interpretation  Performed by: Carrie Mew, MD Authorized by: Carrie Mew,  MD     Interpretation: abnormal     ECG rate:  35   ECG rate assessment: bradycardic     Rhythm: sinus bradycardia     Ectopy: none     Conduction: abnormal     Abnormal conduction: 2nd degree AV block (Mobitz 2)      MEDICATIONS ORDERED IN ED: Medications - No data to display   IMPRESSION / MDM / [place] / ED COURSE  I reviewed the triage vital signs and the nursing notes.                              Differential diagnosis includes, but is not limited to, non-STEMI, symptomatic bradycardia, electrolyte abnormality, AKI, viral illness  Patient's presentation is most consistent with acute presentation with potential threat to life or bodily function.  Patient presents with  dizziness, fatigue, shortness of breath with walking.  Found to be bradycardic with a heart rate of about 30.  EKG shows rhythm is a high-grade AV block.  Will check labs, viral swab and plan to admit.  He sees cardiology Dr. Clayborn Bigness.       FINAL CLINICAL IMPRESSION(S) / ED DIAGNOSES   Final diagnoses:  Symptomatic bradycardia  Mobitz type 2 second degree AV block     Rx / DC Orders   ED Discharge Orders     None        Note:  This document was prepared using Dragon voice recognition software and may include unintentional dictation errors.   Carrie Mew, MD 06/04/22 [phone]

## 2022-06-04 NOTE — Assessment & Plan Note (Signed)
Patient endorses 4-day history of nonproductive cough, pleuritic chest pain, and diffuse body aches.  Chest x-ray is negative.  COVID, influenza and RSV are negative.   - Full RVP panel pending - Supportive management

## 2022-06-04 NOTE — H&P (Addendum)
History and Physical    Patient: BANYAN GOODCHILD WER:[id number] DOB: 11-05-1950 DOA: 06/04/2022 DOS: the patient was seen and examined on 06/04/2022 PCP: [name], MD  Patient coming from: Home  Chief Complaint:  Chief Complaint  Patient presents with   Cough   HPI: CELIA GIBBONS is a 71 y.o. male with medical history significant of hypertension, RCC, hypothyroidism, OSA not on CPAP, who presents to the ED with complaints of nonproductive cough.  Mr. Leslie Andrea states that on 12/14, he developed a nonproductive cough that has persisted since then.  In addition, he endorses bilateral anterior chest pain with deep breaths only that does not occur otherwise.  He denies any radiation of this chest pain.  He has also been experiencing diffuse body aches that were worst overnight and dizziness when standing.  He denies any chest pain with exertion or at rest, palpitations, nausea, vomiting, diarrhea, abdominal pain, fever, chills.  He denies any history of CAD, known MI or structural heart disease. He denies any history of tick bites over the summer; he denies any recent history of viral illness as far as he knows.  He is taking his home medications as prescribed, including verapamil prior to bedtime.  No recent travel.  Per chart review, patient presented to his PCPs office today due to symptoms noted above.  At that time, he was noted to be bradycardic at 40 with EKG changes.  Due to this, patient was recommended to go to the ED.  Influenza testing at PCPs office negative.  ED course: On arrival to the ED, patient was normotensive at 122/97 with heart rate of 26.  EKG with second-degree AV block.  Initial workup remarkable for troponin of 56, glucose of 104, TSH of 5.5.  Influenza, COVID, RSV negative.  Chest x-ray with no acute abnormalities.  Dr. Clayborn Bigness messaged by EDP.  TR[facility] contacted for admission for symptomatic bradycardia.  Review of Systems: As mentioned in the history of present illness.  All other systems reviewed and are negative.  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 Moderate to severe atypia, edge involved.   Hyperlipidemia    Hypertension    Hypothyroidism    Low back pain    Migraine    Obesity    OP (osteoporosis)    OSA (obstructive sleep apnea)    Primary osteoarthritis of both knees 06/27/5091   Systolic murmur 26/71/2458   Thyroid disease    Vitamin D deficiency    Vitreomacular adhesion of left eye 10/05/2019   Past Surgical History:  Procedure Laterality Date   BACK SURGERY     COLONOSCOPY WITH PROPOFOL N/A 08/14/2019   Procedure: COLONOSCOPY WITH PROPOFOL;  Surgeon: Lin Landsman, MD;  Location: [facility];  Service: Gastroenterology;  Laterality: N/A;  PRIORITY 4   EYE SURGERY  2016   right cataract removal   IR RADIOLOGIST EVAL & MGMT  10/31/2021   IR RADIOLOGIST EVAL & MGMT  01/09/2022   LAMINECTOMY  1985   L-5   RADIOLOGY WITH ANESTHESIA N/A 11/29/2021   Procedure: CT MICROWAVE ABLATION;  Surgeon: Criselda Peaches, MD;  Location: [facility];  Service: Radiology;  Laterality: N/A;   [place]   Social History:  reports that he quit smoking about 51 years ago. His smoking use included cigarettes. He has  a 2.50 pack-year smoking history. He has never used smokeless tobacco. He reports current alcohol use of about 4.0 standard drinks of alcohol per week. He reports that he does not use drugs.  No Known Allergies  Family History  Problem Relation Age of Onset   Heart disease Father    Early death Father    Hypothyroidism Sister    Thyroid disease Sister    Hypothyroidism Sister    Migraines Sister    Prostate cancer Neg Hx    Bladder Cancer Neg Hx    Kidney cancer Neg Hx     Prior to Admission medications   Medication Sig Start Date End Date Taking? Authorizing Provider  aspirin EC 81  MG tablet Take 81 mg by mouth daily. Swallow whole.    [provider]  Cholecalciferol (VITAMIN D) 2000 units CAPS Take 1 capsule (2,000 Units total) by mouth daily. 07/07/15   [name], MD  Cinnamon 500 MG TABS Take 1,000 mg by mouth daily.    [provider]  CRANBERRY PO Take 4,200 mg by mouth daily.    [provider]  febuxostat (ULORIC) 40 MG tablet Take 1 tablet (40 mg total) by mouth daily. 01/17/22   [name], MD  furosemide (LASIX) 20 MG tablet TAKE ONE TABLET BY MOUTH TWICE A DAY 02/09/22   Sowles, Drue Stager, MD  KLOR-CON M20 20 MEQ tablet TAKE ONE TABLET BY MOUTH TWICE A DAY TAKE ONLY WHEN YOU TAKE FUROSEMIDE 04/15/22   [name], MD  levothyroxine (SYNTHROID) 150 MCG tablet Take 1 tablet (150 mcg total) by mouth daily. 02/12/22   [name], MD  Multiple Vitamins-Minerals (CENTRUM SILVER 50+MEN) TABS Take 1 tablet by mouth daily.    [provider]  OMEGA-3 FATTY ACIDS PO Take 1 capsule by mouth daily. 01/06/07   [provider]  predniSONE (DELTASONE) 20 MG tablet Take 1 tablet (20 mg total) by mouth 2 (two) times daily with a meal. For 3-5 days prn 12/11/21   [name], MD  rosuvastatin (CRESTOR) 40 MG tablet Take 1 tablet (40 mg total) by mouth daily. 05/22/22   [name], MD  Testosterone 20.25 MG/ACT (1.62%) GEL Apply 2 each topically daily. 2 pumps daily 11/30/20   [name], MD  valsartan (DIOVAN) 320 MG tablet TAKE ONE TABLET BY MOUTH DAILY 04/03/22   [name], MD  verapamil (CALAN-SR) 240 MG CR tablet TAKE ONE TABLET BY MOUTH EVERY NIGHT AT BEDTIME 04/03/22   [name], MD    Physical Exam: Vitals:   06/04/22 1231 06/04/22 1357 06/04/22 1420 06/04/22 1541  BP: (!) 122/97  (!) 195/86 (!) 202/96  Pulse: (!) 26  (!) 37   Resp: 19  13   Temp: 99.1 F (37.3 C)     TempSrc: Oral     SpO2: 98%  94%   Weight: 107 kg 113.3 kg    Height:  '5\' 11"'$  (1.803 m)     Physical Exam Vitals and  nursing note reviewed.  Constitutional:      General: He is not in acute distress.    Appearance: He is obese. He is not toxic-appearing.  HENT:     Head: Normocephalic and atraumatic.     Mouth/Throat:     Mouth: Mucous membranes are moist.     Pharynx: Oropharynx is clear.  Eyes:     Conjunctiva/sclera: Conjunctivae normal.     Pupils: Pupils are equal, round, and reactive to light.  Cardiovascular:  Rate and Rhythm: Regular rhythm. Bradycardia present.     Heart sounds: Murmur (3/6 holosystolic murmur heard in the RUSB and LUSB. 3/6 high-pitched descrendo systolic murmur heard at the apex.) heard.     No friction rub (No friction rub when leaning forward). No gallop.  Pulmonary:     Effort: Pulmonary effort is normal. No respiratory distress.     Breath sounds: Normal breath sounds. No wheezing, rhonchi or rales.  Abdominal:     General: Bowel sounds are normal. There is no distension.     Palpations: Abdomen is soft.     Tenderness: There is no abdominal tenderness. There is no guarding.  Musculoskeletal:     Right lower leg: 1+ Pitting Edema present.     Left lower leg: 1+ Pitting Edema present.  Skin:    General: Skin is warm and dry.     Coloration: Skin is not pale.  Neurological:     General: No focal deficit present.     Mental Status: He is alert and oriented to person, place, and time. Mental status is at baseline.  Psychiatric:        Mood and Affect: Mood normal.        Behavior: Behavior normal.        Thought Content: Thought content normal.        Judgment: Judgment normal.    Data Reviewed: CBC with WBC of 9.5, hemoglobin of 13.2 and platelets of 195. BMP with potassium of 3.6, bicarb of 24, BUN of 22, creatinine of 1.08, calcium of 9.0. Magnesium within normal limits at 2.2 TSH within age-appropriate normal limits at 5.5. Initial troponin elevated at 56 with flat trend to 62. COVID-19 PCR, influenza PCR and RSV PCR negative.  EKG obtained today at  10:17 AM personally reviewed. Sinus rhythm with rate of 38.  Appears to be Mobitz type II with a 2-1 block.  EKG obtained on November 23, 2021 with sinus bradycardia with rate of 54.  First-degree AV block noted at that time.  RBBB with bifascicular block noted.  DG Chest Port 1 View  Result Date: 06/04/2022 CLINICAL DATA:  Dry cough and shortness of breath. EXAM: PORTABLE CHEST 1 VIEW COMPARISON:  07/06/2068 FINDINGS: Defibrillator pads project over the mid and right chest. Lungs are adequately inflated without focal airspace consolidation or effusion. Cardiomediastinal silhouette and remainder the exam is unchanged. IMPRESSION: No active disease. Electronically Signed   By: Marin Olp M.D.   On: 06/04/2022 13:09    Results are pending, will review when available.  Assessment and Plan: * Symptomatic bradycardia Patient presenting to his PCPs office today with URI symptoms found to be bradycardic as low as 26.  EKG with Mobitz type II AV block.  Differential of etiology includes medication induced (patient on verapamil, last dose last night) versus viral infection versus structural heart disease (although less likely).  Echocardiogram obtained in 2019 with no significant structural disease though. Troponin minimally elevated likely due to demand rather than ACS, as patient is only experiencing pleuritic chest pain. No prior history of CAD with normal stress test in 2019.   At this time, patient remains hemodynamically stable.  - Cardiology consulted; appreciate their recommendations - Keep pacer pads at bedside - Can try atropine if there are any changes in hemodynamic status, however patient has been informed that pacer pads may need to be used in that case - RVP pending - Echocardiogram ordered  Upper respiratory infection Patient endorses 4-day history of  nonproductive cough, pleuritic chest pain, and diffuse body aches.  Chest x-ray is negative.  COVID, influenza and RSV are negative.   -  Full RVP panel pending - Supportive management  Benign essential HTN - Hold home verapamil due to bradycardia - Continue home valsartan and Lasix  Hypothyroidism, adult TSH within normal limits when corrected for patient's age.  - Continue home levothyroxine  Lymphedema +1 pitting edema on lower extremities bilaterally.  Patient states this is baseline.  - Continue home Lasix  Systolic murmur On examination, there is a systolic murmur that is low pitched and holosystolic at the bilateral upper sternal borders, however a decrescendo high-pitched systolic murmur in the apex.  Per chart review, he does have a history of a murmur.  Echocardiogram in 2019 with mild tricuspid and mitral valve regurgitation.  -Echocardiogram ordered    Advance Care Planning:   Code Status: Full Code   Consults: Cardiology  Family Communication: Patient's wife updated at bedside.  Severity of Illness: The appropriate patient status for this patient is OBSERVATION. Observation status is judged to be reasonable and necessary in order to provide the required intensity of service to ensure the patient's safety. The patient's presenting symptoms, physical exam findings, and initial radiographic and laboratory data in the context of their medical condition is felt to place them at decreased risk for further clinical deterioration. Furthermore, it is anticipated that the patient will be medically stable for discharge from the hospital within 2 midnights of admission.   Author: Jose Persia, MD 06/04/2022 3:42 PM  For on call review [email or web address].

## 2022-06-04 NOTE — Assessment & Plan Note (Signed)
On examination, there is a systolic murmur that is low pitched and holosystolic at the bilateral upper sternal borders, however a decrescendo high-pitched systolic murmur in the apex.  Per chart review, he does have a history of a murmur.  Echocardiogram in 2019 with mild tricuspid and mitral valve regurgitation.  -Echocardiogram ordered

## 2022-06-04 NOTE — Telephone Encounter (Signed)
[place] office manager spoke w/ patients wife and wrote down her concerns

## 2022-06-04 NOTE — Telephone Encounter (Signed)
  Chief Complaint: Cough, BA, chills, fatigue, Chest pain with breathing Symptoms: above Frequency: yesterday Pertinent Negatives: Patient denies true chest pain Disposition: '[]'$ ED /'[]'$ Urgent Care (no appt availability in office) / '[x]'$ Appointment(In office/virtual)/ '[]'$  [facility]/ '[]'$ Home Care/ '[]'$ Refused Recommended Disposition /'[]'$ [place] [facility]/ '[]'$  Follow-up with PCP Additional Notes: Pt states s/s yesterday afternoon. Cough, BA, fatigue, chest pain with breathing. COVID test was negative.    Reason for Disposition  [1] MILD difficulty breathing (e.g., minimal/no SOB at rest, SOB with walking, pulse <100) AND [2] still present when not coughing  Answer Assessment - Initial Assessment Questions 1. ONSET: "When did the cough begin?"      yesterday 2. SEVERITY: "How bad is the cough today?"      Mild moderate 3. SPUTUM: "Describe the color of your sputum" (none, dry cough; clear, white, yellow, green)     none 4. HEMOPTYSIS: "Are you coughing up any blood?" If so ask: "How much?" (flecks, streaks, tablespoons, etc.)      5. DIFFICULTY BREATHING: "Are you having difficulty breathing?" If Yes, ask: "How bad is it?" (e.g., mild, moderate, severe)    - MILD: No SOB at rest, mild SOB with walking, speaks normally in sentences, can lie down, no retractions, pulse < 100.    - MODERATE: SOB at rest, SOB with minimal exertion and prefers to sit, cannot lie down flat, speaks in phrases, mild retractions, audible wheezing, pulse 100-120.    - SEVERE: Very SOB at rest, speaks in single words, struggling to breathe, sitting hunched forward, retractions, pulse > 120      mild 6. FEVER: "Do you have a fever?" If Yes, ask: "What is your temperature, how was it measured, and when did it start?"     Did have one - resolved now - chills 7. CARDIAC HISTORY: "Do you have any history of heart disease?" (e.g., heart attack, congestive heart failure)      no 8. LUNG HISTORY: "Do you have any  history of lung disease?"  (e.g., pulmonary embolus, asthma, emphysema)     no 9. PE RISK FACTORS: "Do you have a history of blood clots?" (or: recent major surgery, recent prolonged travel, bedridden)      10. OTHER SYMPTOMS: "Do you have any other symptoms?" (e.g., runny nose, wheezing, chest pain)       Chest pain from breathing, fever, fatigue 11. PREGNANCY: "Is there any chance you are pregnant?" "When was your last menstrual period?"        12. TRAVEL: "Have you traveled out of the country in the last month?" (e.g., travel history, exposures)  Protocols used: Cough - Acute Non-Productive-A-AH

## 2022-06-04 NOTE — ED Notes (Signed)
RN contacted PA Orinda Kenner (Cardiology) regarding high blood pressures, PA input medications and pt educated of revised treatment plan.

## 2022-06-04 NOTE — Telephone Encounter (Signed)
Copied from [place] [id number]. Topic: General - Other >> Jun 04, 2022 11:52 AM [place] B wrote: Reason for CRM: Patient's wife called in not happy with [place] not calling the ambulance for the patient , her husband to take him to the ER instead of him having to drive himself with sob at the ER now... Wants to speak with Dr Ancil Boozer nurse about this.

## 2022-06-04 NOTE — Consult Note (Addendum)
Alan Campbell NOTE       Patient ID: Alan Campbell [name]: [id number] DOB/AGE: 09-09-1950 71 y.o.  Admit date: 06/04/2022 Referring Physician Dr. Joni Fears Primary Physician Dr. Dorann Ou Primary Cardiologist Dr. 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 Pulse was between 30-40bpm for which he was referred to [facility] ED. Initial EKG showed a Mobitz type 2 AV block for which cardiology is consulted for further assistance.   He presents with his wife (internal medicine/psychiatry NP) who contributes to the history.  He states that several days he was having cough, significant generalized body aches, and discomfort in his chest exclusively associated with respirations.  He has some shortness of breath over the past several days as well, but he ambulates around his home breakfast daily as he feels somewhat labored, but recovers with rest.  He has some dizziness exclusively with position changes (sitting to standing) that is transient in nature.  Although he notes a single episode 3 months ago when he was reaching for something on top of the fridge and he felt incredibly lightheaded and leaned against the counters in his kitchen and slid to the floor before the dizziness resolved.  He denied atrial syncopal episodes or losing consciousness at that time.  He has chronic peripheral edema which is at his baseline today.  He most recently took his verapamil 360 mg last evening which he takes for his hypertension along with valsartan.   In the emergency department he was then Mobitz 2 second-degree AV block with 2-1  conduction on twelve-lead and telemetry with a RBBB that is chronic in nature.  Ventricular rate has been between 31-40 bpm.  Blood pressure elevated at 202/96 on most recent check. Comfortable on room air.  At my time of evaluation this afternoon, he says he "feels pretty good" and he denies dizziness or lightheadedness while sitting at rest, defibrillator pads in place.  He was most recently seen by Dr. Clayborn Bigness in January 2023 and the patient states he follows with him due to his family history of an unfortunate fatal heart attack in his father in his 72s.  He had a normal treadmill Myoview in 2019 that showed preserved LV function without evidence of ischemia.  He had an echocardiogram in 2019 that showed an EF >55%, annular calcification of mitral valve with mild mitral valve insufficiency without evidence of valvular stenosis.  Labs are notable for K+ 3.6, BUN/Cr 22/1.08, GFR >60. Mag 2.2. WBC 9.5, H/H 13.2/40.3. plts 195. TSH 5.52 (was 1.9 in August 2023).  High-sensitivity troponin slightly elevated with a flat trend at 56, 62.  CXR without active cardiopulmonary disease.   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 Moderate to severe atypia, edge involved.   Hyperlipidemia    Hypertension    Hypothyroidism    Low back pain    Migraine    Obesity  OP (osteoporosis)    OSA (obstructive sleep apnea)    Primary osteoarthritis of both knees 01/17/2022   Thyroid disease    Vitamin D deficiency    Vitreomacular adhesion of left eye 10/05/2019    Past Surgical History:  Procedure Laterality Date   BACK SURGERY     COLONOSCOPY WITH PROPOFOL N/A 08/14/2019   Procedure: COLONOSCOPY WITH PROPOFOL;  Surgeon: Lin Landsman, MD;  Location: [facility] ENDOSCOPY;  Service:  Gastroenterology;  Laterality: N/A;  PRIORITY 4   EYE SURGERY  2016   right cataract removal   IR RADIOLOGIST EVAL & MGMT  10/31/2021   IR RADIOLOGIST EVAL & MGMT  01/09/2022   LAMINECTOMY  1985   L-5   RADIOLOGY WITH ANESTHESIA N/A 11/29/2021   Procedure: CT MICROWAVE ABLATION;  Surgeon: Criselda Peaches, MD;  Location: [facility] ORS;  Service: Radiology;  Laterality: N/A;   [place]    (Not in a hospital admission)  Social History   Socioeconomic History   Marital status: Married    Spouse name: donna   Number of children: 0   Years of education: Not on file   Highest education level: Not on file  Occupational History   Occupation: managing/training   Tobacco Use   Smoking status: Former    Packs/day: 0.50    Years: 5.00    Total pack years: 2.50    Types: Cigarettes    Quit date: 10/03/1970    Years since quitting: 51.7   Smokeless tobacco: Never   Tobacco comments:    Quit 1972  Vaping Use   Vaping Use: Never used  Substance and Sexual Activity   Alcohol use: Yes    Alcohol/week: 4.0 standard drinks of alcohol    Types: 4 Standard drinks or equivalent per week   Drug use: No   Sexual activity: Yes    Partners: Female  Other Topics Concern   Not on file  Social History Narrative   Married since 1974, no children but they helped raise Alan Campbell ( wife's niece), also helped raise their God-daughter Alan Campbell   He is planning on retiring January 2021   Social Determinants of Health   Financial Resource Strain: Low Risk  (07/27/2021)   Overall Financial Resource Strain (CARDIA)    Difficulty of Paying Living Expenses: Not hard at all  Food Insecurity: No Food Insecurity (10/04/2021)   Hunger Vital Sign    Worried About Running Out of Food in the Last Year: Never true    [place] in the Last Year: Never true  Transportation Needs: No Transportation Needs (10/04/2021)   PRAPARE - Hydrologist (Medical): No    Lack of  Transportation (Non-Medical): No  Physical Activity: Inactive (07/27/2021)   Exercise Vital Sign    Days of Exercise per Week: 0 days    Minutes of Exercise per Session: 0 min  Stress: No Stress Concern Present (07/27/2021)   [place]    Feeling of Stress : Not at all  Social Connections: [place] (07/27/2021)   Social Connection and Isolation Panel [NHANES]    Frequency of Communication with Friends and Family: More than three times a week    Frequency of Social Gatherings with Friends and Family: Three times a week    Attends Religious Services: More than 4 times per year    Active Member of Clubs or Organizations: Yes    Attends  Club or Organization Meetings: More than 4 times per year    Marital Status: Married  Human resources officer Violence: Not At Risk (07/27/2021)   Humiliation, Afraid, Rape, and Kick questionnaire    Fear of Current or Ex-Partner: No    Emotionally Abused: No    Physically Abused: No    Sexually Abused: No    Family History  Problem Relation Age of Onset   Heart disease Father    Early death Father    Hypothyroidism Sister    Thyroid disease Sister    Hypothyroidism Sister    Migraines Sister    Prostate cancer Neg Hx    Bladder Cancer Neg Hx    Kidney cancer Neg Hx      No intake or output data in the 24 hours ending 06/04/22 1444  Vitals:   06/04/22 1231 06/04/22 1357 06/04/22 1420  BP: (!) 122/97  (!) 195/86  Pulse: (!) 26  (!) 37  Resp: 19  13  Temp: 99.1 F (37.3 C)    TempSrc: Oral    SpO2: 98%  94%  Weight: 107 kg 113.3 kg   Height:  '5\' 11"'$  (1.803 m)     PHYSICAL EXAM General: Pleasant well-appearing elderly Caucasian man, in no acute distress.  Sitting upright in ED stretcher with wife at bedside HEENT:  Normocephalic and atraumatic. Neck:  No JVD.  Lungs: Normal respiratory effort on room air. Clear bilaterally to auscultation. No wheezes, crackles, rhonchi.   Heart: Bradycardic and irregular. Normal S1 and S2. 4/6 crescendo systolic murmur heard best at the LUSB. 4/6 holosystolic murmur at the apex. Abdomen: Non-distended appearing with excess adiposity.  Msk: Normal strength and tone for age. Extremities: Warm and well perfused. No clubbing, cyanosis. 1+ LLE and trace RLE edema.  Neuro: Alert and oriented X 3. Psych:  Answers questions appropriately.   Labs: Basic Metabolic Panel: Recent Labs    06/04/22 1253  NA 142  K 3.6  CL 111  CO2 24  GLUCOSE 104*  BUN 22  CREATININE 1.08  CALCIUM 9.0  MG 2.2   Liver Function Tests: No results for input(s): "AST", "ALT", "ALKPHOS", "BILITOT", "PROT", "ALBUMIN" in the last 72 hours. No results for input(s): "LIPASE", "AMYLASE" in the last 72 hours. CBC: Recent Labs    06/04/22 1253  WBC 9.5  HGB 13.2  HCT 40.3  MCV 92.0  PLT 195   Cardiac Enzymes: Recent Labs    06/04/22 1253  TROPONINIHS 56*   BNP: No results for input(s): "BNP" in the last 72 hours. D-Dimer: No results for input(s): "DDIMER" in the last 72 hours. Hemoglobin A1C: No results for input(s): "HGBA1C" in the last 72 hours. Fasting Lipid Panel: No results for input(s): "CHOL", "HDL", "LDLCALC", "TRIG", "CHOLHDL", "LDLDIRECT" in the last 72 hours. Thyroid Function Tests: Recent Labs    06/04/22 1253  TSH 5.522*   Anemia Panel: No results for input(s): "VITAMINB12", "FOLATE", "FERRITIN", "TIBC", "IRON", "RETICCTPCT" in the last 72 hours.   Radiology: [facility] Chest Port 1 View  Result Date: 06/04/2022 CLINICAL DATA:  Dry cough and shortness of breath. EXAM: PORTABLE CHEST 1 VIEW COMPARISON:  07/06/2068 FINDINGS: Defibrillator pads project over the mid and right chest. Lungs are adequately inflated without focal airspace consolidation or effusion. Cardiomediastinal silhouette and remainder the exam is unchanged. IMPRESSION: No active disease. Electronically Signed   By: Marin Olp M.D.   On: 06/04/2022 13:09     07/22/2017 treadmill myoview  Impression   Normal myocardial perfusion  scan no evidence of stress-induced myocardial ischemia ejection fraction of 66% conclusion negative scan low risk scan Narrative  This result has an attachment that is not available. [facility] [address] [place], [place]  [address] [phone]  Procedure: Exercise Myocardial Perfusion Imaging   ONE day procedure  Indication: Abnormal ECG Plan: NM myocardial perfusion SPECT multiple (stress       and rest), ECG stress test only  Equivalent angina (CMS-HCC) Plan: NM myocardial perfusion SPECT multiple (stress       and rest), ECG stress test only  Ordering Physician:  Dr. Lujean Amel   Clinical History: 71 y.o. year old male recent anginal symptoms Vitals: Height: 71 in  Weight: 250 lb Cardiac risk factors include:    Hyperlipidemia, Diabetes, HTN and Obesity   Procedure: The patient performed treadmill exercise using a Bruce protocol for 8:00 minutes. The exercise test was stopped due to fatigue.  Blood pressure response was normal. The patient developed symptoms other than fatigue during the procedure; specific symptoms included SOB AND SEVERE FATIGUE  Rest HR: 67bpm Rest BP: 142/52mHg Max HR: 134bpm Max BP: 160/641mg Mets:     10.10 % MAX HR:   87%  Stress Test Administered by: DIOswald HillockCMA  ECG Interpretation: Rest ECG:  normal sinus rhythm, right bundle branch block (RBBB) Stress ECG:  sinus tachycardia, RBBB Recovery ECG:  normal sinus rhythm ECG Interpretation:  negative, nondiagnostic changes.  Gated post-stress perfusion imaging was performed 30 minutes after stress. Rest images were performed 30 minutes after injection.  Gated LV Analysis: TID:  0.77  LVEF= 66 %  FINDINGS: Regional wall motion:  reveals normal myocardial thickening and wall motion. The overall quality of the study is good.    Artifacts noted: no Left ventricular cavity: normal.  ECHO 07/22/2017 NORMAL LEFT VENTRICULAR SYSTOLIC FUNCTION WITH AN ESTIMATED EF = >55 %  NORMAL RIGHT VENTRICULAR SYSTOLIC FUNCTION  MILD TRICUSPID AND MITRAL VALVE INSUFFICIENCY  NO VALVULAR STENOSIS  MILD RV ENLARGEMENT  MILD BIATRIAL ENLARGEMENT   TELEMETRY reviewed by me (LT) 06/04/2022 : Mobitz 2 second-degree AV block with a chronic RBBB with ventricular rate 34-40 bpm. No pauses or evidence of SSS.  EKG reviewed by me: sinus rhythm with 2:1 mobitz 2 2nd degree AV block, RBBB, V rate 39bpm  Data reviewed by me (LT) 06/04/2022: last pCP note, last nephroloyg note, cardiology note from jan 2023, ekgs, troponins, vitals tele, cbc, bmp   Principal Problem:   Symptomatic bradycardia Active Problems:   Benign essential HTN   Hypothyroidism, adult    ASSESSMENT AND PLAN:  [place]. 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 Pulse was between 30-40bpm [facility] Initial EKG showed a Mobitz type 2 AV block for which cardiology is consulted for further assistance.   # Upper respiratory infection Presented to his PCP with a 4-day history of nonproductive cough, shortness of breath, pleuritic chest pain, and generalized myalgias.  Denies sick contacts. Influenza, COVID, RSV negative, extended respiratory panel pending. -Supportive care per primary team  # Symptomatic bradycardia # Mobitz 2 second-degree AV block Reported heart rate at his PCPs office  the morning of 12/18 was anywhere from 30 to 40 bpm, initial EKG in the emergency department showed a Mobitz 2 second-degree AV block with a chronic RBBB with ventricular  rate 34-40 bpm on telemetry.  He is hypertensive and hemodynamically stable at my time of evaluation this afternoon.  Most recently took verapamil 360 mg yesterday evening. -Continuous monitoring on telemetry -Hold CCB, BB -As needed atropine, continue pacing pads -Monitor and replete electrolytes for a K >4, mag >2 -No indication for temporary transvenous pacemaker or permanent pacemaker at this time.  Briefly discussed the indications for TTP and PPM along with types of PPMs (DC PPM vs Micra leadless) if indicated. -Anticipate a 24-48-hour washout period for his CCB  # HFpEF # Mild MR & mitral annular calcification by echo 07/2017 He has chronic peripheral edema, well-managed with Lasix 20 mg p.o. daily.  On exam today he has 1+ pitting edema L>R lower extremity, at baseline per patient.  BNP pending.  I can also appreciate a 4/6 systolic murmur both at the LUSB and apex, chronic in nature per patient.  Last echocardiogram in 2019 showed mild MR and annular calcification of the mitral valve. -Repeat echocardiogram complete -ordered irbesartan '75mg'$  daily (home ARB is valsartan '320mg'$  daily), Lasix 20 mg once daily  # Demand ischemia  Troponin borderline elevated at 56, 62 which is most consistent with demand/supply mismatch and not ACS. -continue aspirin '81mg'$  daily -continue rosuvastatin '40mg'$  daily  This patient's plan of care was discussed and created with Dr. Saralyn Pilar and he is in agreement.  Signed: Tristan Schroeder , PA-C 06/04/2022, 2:44 PM [facility]

## 2022-06-04 NOTE — Assessment & Plan Note (Signed)
+  1 pitting edema on lower extremities bilaterally.  Patient states this is baseline.  - Continue home Lasix

## 2022-06-04 NOTE — Assessment & Plan Note (Signed)
-   Hold home verapamil due to bradycardia - Continue home valsartan and Lasix

## 2022-06-04 NOTE — ED Triage Notes (Signed)
Pt sts that he started with a dry cough. Last PM pt sts that he has been having SOB. Pt was seen at his PCP office and they advised him that his VS were abnormal.

## 2022-06-05 ENCOUNTER — Telehealth: Payer: Self-pay | Admitting: Family Medicine

## 2022-06-05 ENCOUNTER — Other Ambulatory Visit: Payer: Self-pay

## 2022-06-05 ENCOUNTER — Observation Stay
Admit: 2022-06-05 | Discharge: 2022-06-05 | Disposition: A | Discharge status: Home / Self Care | Payer: Medicare Other | Attending: Internal Medicine | Admitting: Internal Medicine

## 2022-06-05 DIAGNOSIS — R001 Bradycardia, unspecified: Secondary | ICD-10-CM | POA: Diagnosis present

## 2022-06-05 DIAGNOSIS — Z1152 Encounter for screening for COVID-19: Secondary | ICD-10-CM | POA: Diagnosis not present

## 2022-06-05 DIAGNOSIS — G4733 Obstructive sleep apnea (adult) (pediatric): Secondary | ICD-10-CM | POA: Diagnosis present

## 2022-06-05 DIAGNOSIS — I3139 Other pericardial effusion (noninflammatory): Secondary | ICD-10-CM | POA: Diagnosis present

## 2022-06-05 DIAGNOSIS — Z8249 Family history of ischemic heart disease and other diseases of the circulatory system: Secondary | ICD-10-CM | POA: Diagnosis not present

## 2022-06-05 DIAGNOSIS — E785 Hyperlipidemia, unspecified: Secondary | ICD-10-CM | POA: Diagnosis present

## 2022-06-05 DIAGNOSIS — I110 Hypertensive heart disease with heart failure: Secondary | ICD-10-CM | POA: Diagnosis present

## 2022-06-05 DIAGNOSIS — I2489 Other forms of acute ischemic heart disease: Secondary | ICD-10-CM | POA: Diagnosis present

## 2022-06-05 DIAGNOSIS — Z538 Procedure and treatment not carried out for other reasons: Secondary | ICD-10-CM | POA: Diagnosis present

## 2022-06-05 DIAGNOSIS — Z6834 Body mass index (BMI) 34.0-34.9, adult: Secondary | ICD-10-CM | POA: Diagnosis not present

## 2022-06-05 DIAGNOSIS — Z7982 Long term (current) use of aspirin: Secondary | ICD-10-CM | POA: Diagnosis not present

## 2022-06-05 DIAGNOSIS — Z8349 Family history of other endocrine, nutritional and metabolic diseases: Secondary | ICD-10-CM | POA: Diagnosis not present

## 2022-06-05 DIAGNOSIS — Z79890 Hormone replacement therapy (postmenopausal): Secondary | ICD-10-CM | POA: Diagnosis not present

## 2022-06-05 DIAGNOSIS — M810 Age-related osteoporosis without current pathological fracture: Secondary | ICD-10-CM | POA: Diagnosis present

## 2022-06-05 DIAGNOSIS — I350 Nonrheumatic aortic (valve) stenosis: Secondary | ICD-10-CM

## 2022-06-05 DIAGNOSIS — Z87891 Personal history of nicotine dependence: Secondary | ICD-10-CM | POA: Diagnosis not present

## 2022-06-05 DIAGNOSIS — I083 Combined rheumatic disorders of mitral, aortic and tricuspid valves: Secondary | ICD-10-CM | POA: Diagnosis present

## 2022-06-05 DIAGNOSIS — I890 Lymphedema, not elsewhere classified: Secondary | ICD-10-CM | POA: Diagnosis present

## 2022-06-05 DIAGNOSIS — E039 Hypothyroidism, unspecified: Secondary | ICD-10-CM | POA: Diagnosis present

## 2022-06-05 DIAGNOSIS — E669 Obesity, unspecified: Secondary | ICD-10-CM | POA: Diagnosis present

## 2022-06-05 DIAGNOSIS — I441 Atrioventricular block, second degree: Secondary | ICD-10-CM | POA: Diagnosis present

## 2022-06-05 DIAGNOSIS — Z006 Encounter for examination for normal comparison and control in clinical research program: Secondary | ICD-10-CM | POA: Diagnosis not present

## 2022-06-05 DIAGNOSIS — I5032 Chronic diastolic (congestive) heart failure: Secondary | ICD-10-CM | POA: Diagnosis present

## 2022-06-05 DIAGNOSIS — Z79899 Other long term (current) drug therapy: Secondary | ICD-10-CM | POA: Diagnosis not present

## 2022-06-05 DIAGNOSIS — J069 Acute upper respiratory infection, unspecified: Secondary | ICD-10-CM | POA: Diagnosis present

## 2022-06-05 LAB — BASIC METABOLIC PANEL
Anion gap: 6 (ref 5–15)
BUN: 22 mg/dL (ref 8–23)
CO2: 24 mmol/L (ref 22–32)
Calcium: 8.7 mg/dL — ABNORMAL LOW (ref 8.9–10.3)
Chloride: 111 mmol/L (ref 98–111)
Creatinine, Ser: 0.96 mg/dL (ref 0.61–1.24)
GFR, Estimated: >60 mL/min (ref >60)
Glucose, Bld: 115 mg/dL — ABNORMAL HIGH (ref 70–99)
Potassium: 3.4 mmol/L — ABNORMAL LOW (ref 3.5–5.1)
Sodium: 141 mmol/L (ref 135–145)

## 2022-06-05 LAB — CBC
HCT: 35.6 % — ABNORMAL LOW (ref 39.0–52.0)
Hemoglobin: 11.7 g/dL — ABNORMAL LOW (ref 13.0–17.0)
MCH: 29.7 pg (ref 26.0–34.0)
MCHC: 32.9 g/dL (ref 30.0–36.0)
MCV: 90.4 fL (ref 80.0–100.0)
Platelets: 176 10*3/uL (ref 150–400)
RBC: 3.94 MIL/uL — ABNORMAL LOW (ref 4.22–5.81)
RDW: 14.8 % (ref 11.5–15.5)
WBC: 7.6 10*3/uL (ref 4.0–10.5)
nRBC: 0 % (ref 0.0–0.2)

## 2022-06-05 MED ORDER — IRBESARTAN 150 MG PO TABS
150.0000 mg | ORAL_TABLET | Freq: Every day | ORAL | Status: DC
  Administered 2022-06-05 – 2022-06-07 (×3): 150 mg via ORAL
  Filled 2022-06-05 (×3): qty 1

## 2022-06-05 NOTE — Hospital Course (Addendum)
Alan Campbell is a 71 y.o. male with medical history significant of hypertension, RCC, hypothyroidism, OSA not on CPAP, who presents to the ED 06/04/22 with complaints of nonproductive cough since 12/14, developed chest pain w/ deep breaths. Went to [facility], HR 40s, was sent to ED. 12/18: normotensive at 122/97 with heart rate of 26. EKG with second-degree AV block. Initial workup remarkable for troponin of 56, glucose of 104, TSH of 5.5. Influenza, COVID, RSV negative. Chest x-ray with no acute abnormalities. Dr. Clayborn Bigness of cardiology was consulted by EDP. Advised hold CCB and BB, as needed atropine, continue pacing pads , goal electrolytes K >4, Mg >2, no indication for pacemaker (temp or permanent) at this time, allow 24-48 hr washout period for CCB 12/19: still brady overnight, low as HR 28. BP stable.   Consultants:  Cardiology   Procedures: none      ASSESSMENT & PLAN:   Principal Problem:   Symptomatic bradycardia Active Problems:   Upper respiratory infection   Benign essential HTN   Hypothyroidism, adult   Lymphedema   Systolic murmur   Symptomatic bradycardia Differential of etiology includes medication induced (patient on verapamil, last dose last night) versus viral infection versus structural heart disease (although less likely).   Echocardiogram obtained in 2019 with no significant structural disease.  Troponin minimally elevated likely due to demand rather than ACS, as patient is only experiencing pleuritic chest pain.  No prior history of CAD with normal stress test in 2019.  Cardiology consulted, recs as follows:  hold CCB and BB as needed atropine continue pacing pads  goal electrolytes K >4, Mg >2,  Plan for pacemaker tomorrow  Echocardiogram ordered  Upper respiratory infection Chest x-ray is negative.   COVID, influenza and RSV are negative.  RVP negative Supportive management  Benign essential HTN Hold home verapamil due to bradycardia Continue home  valsartan and Lasix  Hypothyroidism, adult TSH within normal limits when corrected for patient's age. Continue home levothyroxine  Lymphedema +1 pitting edema on lower extremities bilaterally.  Patient states this is baseline. Continue home Lasix  Systolic murmur On examination, there is a systolic murmur that is low pitched and holosystolic at the bilateral upper sternal borders, however a decrescendo high-pitched systolic murmur in the apex.  Per chart review, he does have a history of a murmur.  Echocardiogram in 2019 with mild tricuspid and mitral valve regurgitation. Echocardiogram ordered      DVT prophylaxis: lovenox  Pertinent IV fluids/nutrition: no continuous IV fluids  Central lines / invasive devices: none. He does have pacer pads in place   Code Status: FULL CODE  Disposition: obs --> inpatient  TOC needs: none at this time Barriers to discharge / significant pending items: cardiology planning pacemaker tomorrow

## 2022-06-05 NOTE — ED Notes (Addendum)
Pt A&Ox4. Pt given morning meds and tolerated well. Contracted pharmacy to adjust 2 of pt medications because pt takes them normally at nighttime and that's what he would prefer. Pt up to the bathroom toilet and sink with a steady gait.

## 2022-06-05 NOTE — ED Notes (Addendum)
Pt A&Ox4. Pt heartrate in sinus brady. Pt resting in bed with RR normal.

## 2022-06-05 NOTE — Progress Notes (Signed)
[place] NOTE       Patient ID: Alan Campbell [name]: [id number] DOB/AGE: [birth year] 71 y.o.  Admit date: 06/04/2022 Referring Physician Dr. Joni Fears Primary Physician Dr. Dorann Ou Primary Cardiologist Dr. 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 Pulse was between 30-40bpm for which he was referred to [facility] ED. Initial EKG showed a Mobitz type 2 AV block for which cardiology is consulted for further assistance.   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 Moderate to severe atypia, edge involved.   Hyperlipidemia    Hypertension    Hypothyroidism    Low back pain    Migraine    Obesity    OP (osteoporosis)    OSA (obstructive sleep apnea)    Primary osteoarthritis of both knees 07/27/5282   Systolic murmur 13/24/4010   Thyroid disease    Vitamin D deficiency    Vitreomacular adhesion of left eye 10/05/2019    Past Surgical History:  Procedure Laterality Date   BACK SURGERY     COLONOSCOPY WITH PROPOFOL N/A 08/14/2019   Procedure: COLONOSCOPY WITH PROPOFOL;  Surgeon: Lin Landsman, MD;  Location: [place];  Service:  Gastroenterology;  Laterality: N/A;  PRIORITY 4   EYE SURGERY  2016   right cataract removal   IR RADIOLOGIST EVAL & MGMT  10/31/2021   IR RADIOLOGIST EVAL & MGMT  01/09/2022   LAMINECTOMY  1985   L-5   RADIOLOGY WITH ANESTHESIA N/A 11/29/2021   Procedure: CT MICROWAVE ABLATION;  Surgeon: Criselda Peaches, MD;  Location: [facility];  Service: Radiology;  Laterality: N/A;   [place]    (Not in a hospital admission)  Social History   Socioeconomic History   Marital status: Married    Spouse name: donna   Number of children: 0   Years of education: Not on file   Highest education level: Not on file  Occupational History   Occupation: managing/training   Tobacco Use   Smoking status: Former    Packs/day: 0.50    Years: 5.00    Total pack years: 2.50    Types: Cigarettes    Quit date: 10/03/1970    Years since quitting: 51.7   Smokeless tobacco: Never   Tobacco comments:    Quit 1972  Vaping Use   Vaping Use: Never used  Substance and Sexual Activity   Alcohol use: Yes    Alcohol/week: 4.0 standard drinks of alcohol    Types: 4 Standard drinks or equivalent per week   Drug use: No   Sexual activity: Yes    Partners: Female  Other Topics Concern   Not on file  Social History Narrative   Married since 1974, no children but they helped raise Tretha Sciara ( wife's niece), also helped raise their God-daughter Lyndee Leo   He is planning on retiring January 2021   Social Determinants of Health   Financial Resource Strain: Low Risk  (07/27/2021)   Overall Financial Resource Strain (CARDIA)    Difficulty of Paying Living Expenses: Not hard at all  Food Insecurity: No Food Insecurity (10/04/2021)   Hunger Vital Sign    Worried About Running Out of Food in the Last Year: Never true    [place] in the Last Year: Never true  Transportation Needs: No Transportation Needs (10/04/2021)   PRAPARE - Hydrologist (Medical): No    Lack of  Transportation (Non-Medical): No  Physical Activity: Inactive (07/27/2021)   Exercise Vital Sign    Days of Exercise per Week: 0 days    Minutes of Exercise per Session: 0 min  Stress: No Stress Concern Present (07/27/2021)   [place]    Feeling of Stress : Not at all  Social Connections: [place] (07/27/2021)   Social Connection and Isolation Panel [NHANES]    Frequency of Communication with Friends and Family: More than three times a week    Frequency of Social Gatherings with Friends and Family: Three times a week    Attends Religious Services: More than 4 times per year    Active Member of Clubs or Organizations: Yes    Attends Archivist Meetings: More than 4 times per year    Marital Status: Married  Human resources officer Violence: Not At Risk (07/27/2021)   Humiliation, Afraid, Rape, and Kick questionnaire    Fear of Current or Ex-Partner: No    Emotionally Abused: No    Physically Abused: No    Sexually Abused: No    Family History  Problem Relation Age of Onset   Heart disease Father    Early death Father    Hypothyroidism Sister    Thyroid disease Sister    Hypothyroidism Sister    Migraines Sister    Prostate cancer Neg Hx    Bladder Cancer Neg Hx    Kidney cancer Neg Hx       Intake/Output Summary (Last 24 hours) at 06/05/2022 1035 Last data filed at 06/05/2022 0525 Gross per 24 hour  Intake --  Output 300 ml  Net -300 ml    Vitals:   06/05/22 0830 06/05/22 0900 06/05/22 0930 06/05/22 0939  BP: (!) 157/53   (!) 193/70  Pulse: (!) 45 (!) 38 (!) 35 (!) 40  Resp: '19 20 17 12  '$ Temp:      TempSrc:      SpO2: 96% 96% 92% 96%  Weight:      Height:        PHYSICAL EXAM General: Pleasant well-appearing elderly Caucasian man, in no acute distress.  Sitting upright in ED stretcher  HEENT:  Normocephalic and atraumatic. Neck:  No JVD.  Lungs: Normal respiratory effort on room air.  Clear bilaterally to auscultation. No wheezes, crackles, rhonchi.  Heart: Bradycardic and irregular. Normal S1 and S2. 4/6 crescendo systolic murmur heard best at the LUSB. 4/6 holosystolic murmur at the apex. Abdomen: Non-distended appearing with excess adiposity.  Msk: Normal strength and tone for age. Extremities: Warm and well perfused. No clubbing, cyanosis. 1+ LLE and trace RLE edema.  Neuro: Alert and oriented X 3. Psych:  Answers questions appropriately.   Labs: Basic Metabolic Panel: Recent Labs    06/04/22 1253 06/05/22 0432  NA 142 141  K 3.6 3.4*  CL 111 111  CO2 24 24  GLUCOSE 104* 115*  BUN 22 22  CREATININE 1.08 0.96  CALCIUM 9.0 8.7*  MG 2.2  --     Liver Function Tests: No results for input(s): "AST", "ALT", "ALKPHOS", "BILITOT", "PROT", "ALBUMIN" in the last 72 hours. No results for input(s): "LIPASE", "AMYLASE" in the last 72 hours. CBC: Recent Labs    06/04/22 1253 06/05/22 0432  WBC 9.5 7.6  HGB 13.2 11.7*  HCT 40.3 35.6*  MCV 92.0 90.4  PLT 195 176    Cardiac Enzymes: Recent Labs    06/04/22 1253 06/04/22 1419  TROPONINIHS 56* 62*    BNP: Recent Labs    06/04/22 1253  BNP 559.1*   D-Dimer: No results for input(s): "DDIMER" in the last 72 hours. Hemoglobin A1C: No results for input(s): "HGBA1C" in the last 72 hours. Fasting Lipid Panel: No results for input(s): "CHOL", "HDL", "LDLCALC", "TRIG", "CHOLHDL", "LDLDIRECT" in the last 72 hours. Thyroid Function Tests: Recent Labs    06/04/22 1253  TSH 5.522*    Anemia Panel: No results for input(s): "VITAMINB12", "FOLATE", "FERRITIN", "TIBC", "IRON", "RETICCTPCT" in the last 72 hours.   Radiology: [facility] Chest Port 1 View  Result Date: 06/04/2022 CLINICAL DATA:  Dry cough and shortness of breath. EXAM: PORTABLE CHEST 1 VIEW COMPARISON:  07/06/2068 FINDINGS: Defibrillator pads project over the mid and right chest. Lungs are adequately inflated without focal airspace consolidation or  effusion. Cardiomediastinal silhouette and remainder the exam is unchanged. IMPRESSION: No active disease. Electronically Signed   By: Marin Olp M.D.   On: 06/04/2022 13:09    07/22/2017 treadmill myoview  Impression   Normal myocardial perfusion scan no evidence of stress-induced myocardial ischemia ejection fraction of 66% conclusion negative scan low risk scan Narrative  This result has an attachment that is not available. [facility] [address] [place], [place]  [id number] [phone]  Procedure: Exercise Myocardial Perfusion Imaging   ONE day procedure  Indication: Abnormal ECG Plan: NM myocardial perfusion SPECT multiple (stress       and rest), ECG stress test only  Equivalent angina (CMS-HCC) Plan: NM myocardial perfusion SPECT multiple (stress       and rest), ECG stress test only  Ordering Physician:  Dr. Lujean Amel   Clinical History: 71 y.o. year old male recent anginal symptoms Vitals: Height: 71 in  Weight: 250 lb Cardiac risk factors include:    Hyperlipidemia, Diabetes, HTN and Obesity   Procedure: The patient performed treadmill exercise using a Bruce protocol for 8:00 minutes. The exercise test was stopped due to fatigue.  Blood pressure response was normal. The patient developed symptoms other than fatigue during the procedure; specific symptoms included SOB AND SEVERE FATIGUE  Rest HR: 67bpm Rest BP: 142/64mHg Max HR: 134bpm Max BP: 160/664mg Mets:     10.10 % MAX HR:   87%  Stress Test Administered by: DIOswald HillockCMA  ECG Interpretation: Rest ECG:  normal sinus rhythm, right bundle branch block (RBBB) Stress ECG:  sinus tachycardia, RBBB Recovery ECG:  normal sinus rhythm ECG Interpretation:  negative, nondiagnostic changes.  Gated post-stress perfusion imaging was performed 30 minutes after stress. Rest images were performed 30 minutes after injection.  Gated LV  Analysis: TID:  0.77  LVEF= 66 %  FINDINGS:  Regional wall motion:  reveals normal myocardial thickening and wall motion. The overall quality of the study is good.   Artifacts noted: no Left ventricular cavity: normal.  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 No pauses or evidence of SSS.  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 Pulse was between 30-40bpm for which he was referred to [facility]. Initial EKG showed a Mobitz type 2 AV block for which cardiology is consulted for further assistance.   # Upper respiratory infection Presented to his PCP with a 4-day history of nonproductive cough, shortness of breath, pleuritic chest pain, and generalized myalgias.  Denies sick contacts. 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  He remains hypertensive and hemodynamically stable. Most recently took verapamil 360 mg the evening of 12/17. -Continuous monitoring on telemetry -Hold CCB, BB -As needed atropine, continue pacing pads -Monitor and replete electrolytes for a K >4, mag >2 -No indication for temporary transvenous pacemaker at this time.   -Discussed the risks, benefits, and alternatives to proceeding with DC versus Micra leadless PPM with the patient.  He is agreeable to proceed with Micra leadless PPM tomorrow morning, 12/20 with Dr. Saralyn Pilar should he remain in Mobitz 2 AV block with ventricular rates 30-40 into tomorrow morning.  # HFpEF # Mild MR & mitral annular calcification by echo 07/2017 He has chronic peripheral edema, well-managed with Lasix 20 mg p.o. daily.  On exam today he has 1+ pitting edema L>R lower extremity, at baseline per patient.  BNP pending.  I can also appreciate a 4/6 systolic murmur both at the LUSB and apex, chronic in nature per patient.  Last echocardiogram in 2019 showed mild MR and annular calcification of the mitral valve. -Repeat echocardiogram complete, pending read -increase irbesartan '150mg'$  daily (home ARB is valsartan '320mg'$  daily), Lasix 20 mg once daily  # Demand ischemia  Troponin borderline elevated at 56, 62 which is most consistent with demand/supply mismatch and not ACS. -continue aspirin '81mg'$  daily -continue rosuvastatin '40mg'$  daily  This patient's plan of  care was discussed and created with Dr. Saralyn Pilar and he is in agreement.  Signed: Tristan Schroeder , PA-C 06/05/2022, 10:35 AM [facility]

## 2022-06-05 NOTE — Progress Notes (Signed)
BP in 935T systolic.  PRN hydralazine administered.  BP remains in 017B systolic.  MD notified.  Will continue to monitor.

## 2022-06-05 NOTE — Progress Notes (Signed)
PROGRESS NOTE    Alan Campbell   OMV:[id number] DOB: 1950/06/27  DOA: 06/04/2022 Date of Service: 06/05/22 PCP: [name], MD     Brief Narrative / Hospital Course:  Alan Campbell is a 71 y.o. male with medical history significant of hypertension, RCC, hypothyroidism, OSA not on CPAP, who presents to the ED 06/04/22 with complaints of nonproductive cough since 12/14, developed chest pain w/ deep breaths. Went to [facility], HR 40s, was sent to ED. 12/18: normotensive at 122/97 with heart rate of 26. EKG with second-degree AV block. Initial workup remarkable for troponin of 56, glucose of 104, TSH of 5.5. Influenza, COVID, RSV negative. Chest x-ray with no acute abnormalities. Dr. Clayborn Bigness of cardiology was consulted by EDP. Advised hold CCB and BB, as needed atropine, continue pacing pads , goal electrolytes K >4, Mg >2, no indication for pacemaker (temp or permanent) at this time, allow 24-48 hr washout period for CCB 12/19: still brady overnight, low as HR 28. BP stable.   Consultants:  Cardiology   Procedures: none      ASSESSMENT & PLAN:   Principal Problem:   Symptomatic bradycardia Active Problems:   Upper respiratory infection   Benign essential HTN   Hypothyroidism, adult   Lymphedema   Systolic murmur   Symptomatic bradycardia Differential of etiology includes medication induced (patient on verapamil, last dose last night) versus viral infection versus structural heart disease (although less likely).   Echocardiogram obtained in 2019 with no significant structural disease.  Troponin minimally elevated likely due to demand rather than ACS, as patient is only experiencing pleuritic chest pain.  No prior history of CAD with normal stress test in 2019.  Cardiology consulted, recs as follows:  hold CCB and BB as needed atropine continue pacing pads  goal electrolytes K >4, Mg >2,  Plan for pacemaker tomorrow  Echocardiogram ordered  Upper respiratory  infection Chest x-ray is negative.   COVID, influenza and RSV are negative.  RVP negative Supportive management  Benign essential HTN Hold home verapamil due to bradycardia Continue home valsartan and Lasix  Hypothyroidism, adult TSH within normal limits when corrected for patient's age. Continue home levothyroxine  Lymphedema +1 pitting edema on lower extremities bilaterally.  Patient states this is baseline. Continue home Lasix  Systolic murmur On examination, there is a systolic murmur that is low pitched and holosystolic at the bilateral upper sternal borders, however a decrescendo high-pitched systolic murmur in the apex.  Per chart review, he does have a history of a murmur.  Echocardiogram in 2019 with mild tricuspid and mitral valve regurgitation. Echocardiogram ordered      DVT prophylaxis: lovenox  Pertinent IV fluids/nutrition: no continuous IV fluids  Central lines / invasive devices: none. He does have pacer pads in place   Code Status: FULL CODE  Disposition: obs --> inpatient  TOC needs: none at this time Barriers to discharge / significant pending items: cardiology planning pacemaker tomorrow              Subjective:  Patient reports doing well this morning, no concerns at this time, states didn't sleep well w/ all the beeping when his HR was low but otherwise feels file Denies CP/SOB.  Pain controlled.  Denies new weakness.  Tolerating diet.  Reports no concerns w/ urination/defecation.   Family Communication: cardiology has updated family, pt declines additional call to family  /support persons     Objective Findings:  Vitals:   06/05/22 0939 06/05/22 1015 06/05/22 1045  06/05/22 1111  BP: (!) 193/70   (!) 147/80  Pulse: (!) 40 (!) 38 (!) 39 (!) 107  Resp: '12 11 14 16  '$ Temp:      TempSrc:      SpO2: 96% 99% 97% 94%  Weight:      Height:        Intake/Output Summary (Last 24 hours) at 06/05/2022 1210 Last data filed at  06/05/2022 1047 Gross per 24 hour  Intake 3 ml  Output 300 ml  Net -297 ml   Filed Weights   06/04/22 1231 06/04/22 1357  Weight: 107 kg 113.3 kg    Examination:  Physical Exam Constitutional:      General: He is not in acute distress.    Appearance: Normal appearance. He is not toxic-appearing or diaphoretic.  Eyes:     Extraocular Movements: Extraocular movements intact.     Conjunctiva/sclera: Conjunctivae normal.  Cardiovascular:     Rate and Rhythm: Bradycardia present.     Heart sounds: Murmur heard.  Pulmonary:     Effort: Pulmonary effort is normal. No respiratory distress.     Breath sounds: Normal breath sounds.  Abdominal:     General: There is no distension.     Palpations: Abdomen is soft.  Musculoskeletal:        General: No swelling.     Cervical back: Normal range of motion.     Right lower leg: No edema.     Left lower leg: No edema.  Skin:    General: Skin is warm and dry.  Neurological:     General: No focal deficit present.     Mental Status: He is alert and oriented to person, place, and time.  Psychiatric:        Mood and Affect: Mood normal.        Behavior: Behavior normal.        Thought Content: Thought content normal.        Judgment: Judgment normal.           Scheduled Medications:   aspirin EC  81 mg Oral Daily   enoxaparin (LOVENOX) injection  0.5 mg/kg Subcutaneous Q24H   febuxostat  40 mg Oral Daily   irbesartan  150 mg Oral Daily   levothyroxine  150 mcg Oral Q0600   multivitamin with minerals  1 tablet Oral Daily   potassium chloride SA  20 mEq Oral Daily   rosuvastatin  40 mg Oral Daily   sodium chloride flush  3 mL Intravenous Q12H    Continuous Infusions:   PRN Medications:  acetaminophen **OR** acetaminophen, hydrALAZINE, ondansetron **OR** ondansetron (ZOFRAN) IV, polyethylene glycol  Antimicrobials:  Anti-infectives (From admission, onward)    None           Data Reviewed: I have personally  reviewed following labs and imaging studies  CBC: Recent Labs  Lab 06/04/22 1253 06/05/22 0432  WBC 9.5 7.6  HGB 13.2 11.7*  HCT 40.3 35.6*  MCV 92.0 90.4  PLT 195 867   Basic Metabolic Panel: Recent Labs  Lab 06/04/22 1253 06/05/22 0432  NA 142 141  K 3.6 3.4*  CL 111 111  CO2 24 24  GLUCOSE 104* 115*  BUN 22 22  CREATININE 1.08 0.96  CALCIUM 9.0 8.7*  MG 2.2  --    GFR: Estimated Creatinine Clearance: 89 mL/min (by C-G formula based on SCr of 0.96 mg/dL). Liver Function Tests: No results for input(s): "AST", "ALT", "ALKPHOS", "BILITOT", "PROT", "ALBUMIN" in  the last 168 hours. No results for input(s): "LIPASE", "AMYLASE" in the last 168 hours. No results for input(s): "AMMONIA" in the last 168 hours. Coagulation Profile: No results for input(s): "INR", "PROTIME" in the last 168 hours. Cardiac Enzymes: No results for input(s): "CKTOTAL", "CKMB", "CKMBINDEX", "TROPONINI" in the last 168 hours. BNP (last 3 results) No results for input(s): "PROBNP" in the last 8760 hours. HbA1C: No results for input(s): "HGBA1C" in the last 72 hours. CBG: No results for input(s): "GLUCAP" in the last 168 hours. Lipid Profile: No results for input(s): "CHOL", "HDL", "LDLCALC", "TRIG", "CHOLHDL", "LDLDIRECT" in the last 72 hours. Thyroid Function Tests: Recent Labs    06/04/22 1253  TSH 5.522*   Anemia Panel: No results for input(s): "VITAMINB12", "FOLATE", "FERRITIN", "TIBC", "IRON", "RETICCTPCT" in the last 72 hours. Most Recent Urinalysis On File:     Component Value Date/Time   APPEARANCEUR Clear 05/16/2021 1422   GLUCOSEU Negative 05/16/2021 1422   BILIRUBINUR Negative 05/16/2021 1422   PROTEINUR 3+ (A) 05/16/2021 1422   NITRITE Negative 05/16/2021 1422   LEUKOCYTESUR Negative 05/16/2021 1422   Sepsis Labs: '@LABRCNTIP'$ (procalcitonin:4,lacticidven:4)  Recent Results (from the past 240 hour(s))  Resp panel by RT-PCR (RSV, Flu A&B, Covid) Anterior Nasal Swab      Status: None   Collection Time: 06/04/22  1:22 PM   Specimen: Anterior Nasal Swab  Result Value Ref Range Status   SARS Coronavirus 2 by RT PCR NEGATIVE NEGATIVE Final    Comment: (NOTE) SARS-CoV-2 target nucleic acids are NOT DETECTED.  The SARS-CoV-2 RNA is generally detectable in upper respiratory specimens during the acute phase of infection. The lowest concentration of SARS-CoV-2 viral copies this assay can detect is 138 copies/mL. A negative result does not preclude SARS-Cov-2 infection and should not be used as the sole basis for treatment or other patient management decisions. A negative result may occur with  improper specimen collection/handling, submission of specimen other than nasopharyngeal swab, presence of viral mutation(s) within the areas targeted by this assay, and inadequate number of viral copies(<138 copies/mL). A negative result must be combined with clinical observations, patient history, and epidemiological information. The expected result is Negative.  Fact Sheet for Patients:  EntrepreneurPulse.com.au  Fact Sheet for Healthcare Providers:  IncredibleEmployment.be  This test is no t yet approved or cleared by the [place] FDA and  has been authorized for detection and/or diagnosis of SARS-CoV-2 by FDA under an Emergency Use Authorization (EUA). This EUA will remain  in effect (meaning this test can be used) for the duration of the COVID-19 declaration under Section 564(b)(1) of the Act, 21 U.S.C.section 360bbb-3(b)(1), unless the authorization is terminated  or revoked sooner.       Influenza A by PCR NEGATIVE NEGATIVE Final   Influenza B by PCR NEGATIVE NEGATIVE Final    Comment: (NOTE) The Xpert Xpress SARS-CoV-2/FLU/RSV plus assay is intended as an aid in the diagnosis of influenza from Nasopharyngeal swab specimens and should not be used as a sole basis for treatment. Nasal washings and aspirates are  unacceptable for Xpert Xpress SARS-CoV-2/FLU/RSV testing.  Fact Sheet for Patients: EntrepreneurPulse.com.au  Fact Sheet for Healthcare Providers: IncredibleEmployment.be  This test is not yet approved or cleared by the [place] FDA and has been authorized for detection and/or diagnosis of SARS-CoV-2 by FDA under an Emergency Use Authorization (EUA). This EUA will remain in effect (meaning this test can be used) for the duration of the COVID-19 declaration under Section 564(b)(1) of the Act, 21 U.S.C.  section 360bbb-3(b)(1), unless the authorization is terminated or revoked.     Resp Syncytial Virus by PCR NEGATIVE NEGATIVE Final    Comment: (NOTE) Fact Sheet for Patients: EntrepreneurPulse.com.au  Fact Sheet for Healthcare Providers: IncredibleEmployment.be  This test is not yet approved or cleared by the [place] FDA and has been authorized for detection and/or diagnosis of SARS-CoV-2 by FDA under an Emergency Use Authorization (EUA). This EUA will remain in effect (meaning this test can be used) for the duration of the COVID-19 declaration under Section 564(b)(1) of the Act, 21 U.S.C. section 360bbb-3(b)(1), unless the authorization is terminated or revoked.  Performed at [facility], [place], [place] [address]   Respiratory (~20 pathogens) panel by PCR     Status: None   Collection Time: 06/04/22  1:22 PM   Specimen: Nasopharyngeal Swab; Respiratory  Result Value Ref Range Status   Adenovirus NOT DETECTED NOT DETECTED Final   Coronavirus 229E NOT DETECTED NOT DETECTED Final    Comment: (NOTE) The Coronavirus on the Respiratory Panel, DOES NOT test for the novel  Coronavirus (2019 nCoV)    Coronavirus HKU1 NOT DETECTED NOT DETECTED Final   Coronavirus NL63 NOT DETECTED NOT DETECTED Final   Coronavirus OC43 NOT DETECTED NOT DETECTED Final   Metapneumovirus NOT  DETECTED NOT DETECTED Final   Rhinovirus / Enterovirus NOT DETECTED NOT DETECTED Final   Influenza A NOT DETECTED NOT DETECTED Final   Influenza B NOT DETECTED NOT DETECTED Final   Parainfluenza Virus 1 NOT DETECTED NOT DETECTED Final   Parainfluenza Virus 2 NOT DETECTED NOT DETECTED Final   Parainfluenza Virus 3 NOT DETECTED NOT DETECTED Final   Parainfluenza Virus 4 NOT DETECTED NOT DETECTED Final   Respiratory Syncytial Virus NOT DETECTED NOT DETECTED Final   Bordetella pertussis NOT DETECTED NOT DETECTED Final   Bordetella Parapertussis NOT DETECTED NOT DETECTED Final   Chlamydophila pneumoniae NOT DETECTED NOT DETECTED Final   Mycoplasma pneumoniae NOT DETECTED NOT DETECTED Final    Comment: Performed at [facility], [place]. [address], [place],  [address]         Radiology Studies: No results found.          LOS: 0 days      Emeterio Reeve, DO [facility] 06/05/2022, 12:10 PM    Dictation software may have been used to generate the above note. Typos may occur and escape review in typed/dictated notes. Please contact Dr Sheppard Coil directly for clarity if needed.  Staff may message me via secure chat in [place]  but this may not receive an immediate response,  please page me for urgent matters!  If 7PM-7AM, please contact night coverage [email or web address]

## 2022-06-05 NOTE — Progress Notes (Signed)
*  PRELIMINARY RESULTS* Echocardiogram 2D Echocardiogram has been performed.  Alan Campbell 06/05/2022, 9:27 AM

## 2022-06-06 ENCOUNTER — Encounter
Admission: EM | Disposition: A | Discharge status: Home / Self Care | Payer: Self-pay | Source: Home / Self Care | Attending: Student

## 2022-06-06 ENCOUNTER — Inpatient Hospital Stay
Admit: 2022-06-06 | Discharge: 2022-06-06 | Disposition: A | Discharge status: Home / Self Care | Payer: Medicare Other | Attending: Cardiology | Admitting: Cardiology

## 2022-06-06 ENCOUNTER — Other Ambulatory Visit: Payer: Self-pay

## 2022-06-06 DIAGNOSIS — R001 Bradycardia, unspecified: Secondary | ICD-10-CM | POA: Diagnosis not present

## 2022-06-06 HISTORY — PX: PERICARDIOCENTESIS: CATH118255

## 2022-06-06 HISTORY — PX: PACEMAKER LEADLESS INSERTION: EP1219

## 2022-06-06 LAB — BASIC METABOLIC PANEL
Anion gap: 5 (ref 5–15)
BUN: 29 mg/dL — ABNORMAL HIGH (ref 8–23)
CO2: 23 mmol/L (ref 22–32)
Calcium: 8.6 mg/dL — ABNORMAL LOW (ref 8.9–10.3)
Chloride: 112 mmol/L — ABNORMAL HIGH (ref 98–111)
Creatinine, Ser: 1.06 mg/dL (ref 0.61–1.24)
GFR, Estimated: >60 mL/min (ref >60)
Glucose, Bld: 125 mg/dL — ABNORMAL HIGH (ref 70–99)
Potassium: 3.8 mmol/L (ref 3.5–5.1)
Sodium: 140 mmol/L (ref 135–145)

## 2022-06-06 LAB — ECHOCARDIOGRAM LIMITED
Height: 71 in
Weight: 3996.5 oz

## 2022-06-06 LAB — SURGICAL PCR SCREEN
MRSA, PCR: NEGATIVE
Staphylococcus aureus: NEGATIVE

## 2022-06-06 LAB — NOVEL CORONAVIRUS, NAA: SARS-CoV-2, NAA: NOT DETECTED

## 2022-06-06 LAB — MRSA NEXT GEN BY PCR, NASAL: MRSA by PCR Next Gen: NOT DETECTED

## 2022-06-06 SURGERY — PACEMAKER LEADLESS INSERTION
Anesthesia: Moderate Sedation

## 2022-06-06 SURGERY — PERICARDIOCENTESIS

## 2022-06-06 MED ORDER — HEPARIN (PORCINE) IN NACL 1000-0.9 UT/500ML-% IV SOLN
INTRAVENOUS | Status: AC
  Filled 2022-06-06: qty 500

## 2022-06-06 MED ORDER — FENTANYL CITRATE (PF) 100 MCG/2ML IJ SOLN
INTRAMUSCULAR | Status: DC | PRN
  Administered 2022-06-06: 12.5 ug via INTRAVENOUS
  Administered 2022-06-06: 12.5 ug

## 2022-06-06 MED ORDER — LIDOCAINE HCL (PF) 1 % IJ SOLN
INTRAMUSCULAR | Status: DC | PRN
  Administered 2022-06-06: 5 mL

## 2022-06-06 MED ORDER — SODIUM CHLORIDE 0.9 % IV SOLN: INTRAVENOUS | Status: DC

## 2022-06-06 MED ORDER — HEPARIN (PORCINE) IN NACL 1000-0.9 UT/500ML-% IV SOLN
INTRAVENOUS | Status: AC
  Filled 2022-06-06: qty 1000

## 2022-06-06 MED ORDER — ONDANSETRON HCL 4 MG/2ML IJ SOLN: 4.0000 mg | Freq: Four times a day (QID) | INTRAMUSCULAR | Status: DC | PRN

## 2022-06-06 MED ORDER — MIDAZOLAM HCL 2 MG/2ML IJ SOLN
INTRAMUSCULAR | Status: AC
  Filled 2022-06-06: qty 2

## 2022-06-06 MED ORDER — SODIUM CHLORIDE 0.9% FLUSH
3.0000 mL | Freq: Two times a day (BID) | INTRAVENOUS | Status: DC
  Administered 2022-06-06 – 2022-06-07 (×3): 3 mL via INTRAVENOUS

## 2022-06-06 MED ORDER — ACETAMINOPHEN 325 MG PO TABS
650.0000 mg | ORAL_TABLET | ORAL | Status: DC | PRN
  Administered 2022-06-06 – 2022-06-07 (×2): 650 mg via ORAL
  Filled 2022-06-06: qty 2

## 2022-06-06 MED ORDER — SODIUM CHLORIDE 0.9 % IV SOLN: 250.0000 mL | INTRAVENOUS | Status: DC | PRN

## 2022-06-06 MED ORDER — HEPARIN SODIUM (PORCINE) 1000 UNIT/ML IJ SOLN
INTRAMUSCULAR | Status: AC
  Filled 2022-06-06: qty 10

## 2022-06-06 MED ORDER — HEPARIN SODIUM (PORCINE) 1000 UNIT/ML IJ SOLN
INTRAMUSCULAR | Status: DC | PRN
  Administered 2022-06-06: 5000 [IU] via INTRAVENOUS

## 2022-06-06 MED ORDER — MIDAZOLAM HCL 2 MG/2ML IJ SOLN
INTRAMUSCULAR | Status: DC | PRN
  Administered 2022-06-06: .5 mg via INTRAVENOUS
  Administered 2022-06-06: .5 mg

## 2022-06-06 MED ORDER — FENTANYL CITRATE (PF) 100 MCG/2ML IJ SOLN
INTRAMUSCULAR | Status: AC
  Filled 2022-06-06: qty 2

## 2022-06-06 MED ORDER — CHLORHEXIDINE GLUCONATE CLOTH 2 % EX PADS
6.0000 | MEDICATED_PAD | Freq: Every day | CUTANEOUS | Status: DC
  Administered 2022-06-06 – 2022-06-07 (×2): 6 via TOPICAL

## 2022-06-06 MED ORDER — HEPARIN (PORCINE) IN NACL 1000-0.9 UT/500ML-% IV SOLN
INTRAVENOUS | Status: DC | PRN
  Administered 2022-06-06: 500 mL
  Administered 2022-06-06: 1000 mL
  Administered 2022-06-06 (×2): 500 mL

## 2022-06-06 MED ORDER — SODIUM CHLORIDE 0.9 % IV BOLUS: 300.0000 mL | Freq: Once | INTRAVENOUS | Status: DC

## 2022-06-06 MED ORDER — IOHEXOL 300 MG/ML  SOLN
INTRAMUSCULAR | Status: DC | PRN
  Administered 2022-06-06: 75 mL

## 2022-06-06 MED ORDER — HYDRALAZINE HCL 50 MG PO TABS: 50.0000 mg | ORAL_TABLET | Freq: Four times a day (QID) | ORAL | Status: DC | PRN

## 2022-06-06 MED ORDER — SODIUM CHLORIDE 0.9% FLUSH: 3.0000 mL | INTRAVENOUS | Status: DC | PRN

## 2022-06-06 SURGICAL SUPPLY — 22 items
CABLE ADAPT PACING TEMP 12FT (ADAPTER) IMPLANT
CATH INFINITI 5FR ANG PIGTAIL (CATHETERS) IMPLANT
DILATOR VESSEL 38 20CM 12FR (INTRODUCER) IMPLANT
DILATOR VESSEL 38 20CM 14FR (INTRODUCER) IMPLANT
DILATOR VESSEL 38 20CM 18FR (INTRODUCER) IMPLANT
DILATOR VESSEL 38 20CM 8FR (INTRODUCER) IMPLANT
DRAPE BRACHIAL (DRAPES) IMPLANT
MICRA INTRODUCER SHEATH (SHEATH) ×1
NDL PERC 18GX7CM (NEEDLE) IMPLANT
NEEDLE PERC 18GX7CM (NEEDLE) ×1 IMPLANT
PACEMAKER LEADLESS AV2 MICRA (Pacemaker) IMPLANT
PACK CARDIAC CATH (CUSTOM PROCEDURE TRAY) ×1 IMPLANT
PAD ELECT DEFIB RADIOL ZOLL (MISCELLANEOUS) IMPLANT
SHEATH AVANTI 6FR X 11CM (SHEATH) IMPLANT
SHEATH AVANTI 7FRX11 (SHEATH) IMPLANT
SHEATH INTRODUCER MICRA (SHEATH) IMPLANT
SHEATH PINNACLE 11FRX10 (SHEATH) IMPLANT
SLEEVE REPOSITIONING LENGTH 30 (MISCELLANEOUS) IMPLANT
SUT SILK 0 FSL (SUTURE) IMPLANT
WIRE AMPLATZ SS-J .035X180CM (WIRE) IMPLANT
WIRE GUIDERIGHT .035X150 (WIRE) IMPLANT
WIRE PACING TEMP ST TIP 5 (CATHETERS) IMPLANT

## 2022-06-06 SURGICAL SUPPLY — 5 items
DRAPE BRACHIAL (DRAPES) IMPLANT
KIT MICRO 4FR STIFFIN 15 (SHEATH) IMPLANT
KIT MICROPUNCTURE NIT STIFF (SHEATH) IMPLANT
PACK CARDIAC CATH (CUSTOM PROCEDURE TRAY) ×1 IMPLANT
TRAY PERICARDIOCENTESIS 6FX60 (TRAY / TRAY PROCEDURE) IMPLANT

## 2022-06-06 NOTE — Progress Notes (Signed)
Dr. Saralyn Pilar at bedside now, speaking with wife and pt. Re: pacer placement procedure. Both verbalized understanding of conversation. Rep. Still interrogating pacer secondary to freq. PVC's on monitor.

## 2022-06-06 NOTE — Progress Notes (Signed)
[employer] rep. Adam at bedside, post Micra pacer implant, interrogating pacer now. Pt. Wife at bedside also visiting pt.

## 2022-06-06 NOTE — Progress Notes (Signed)
ECHO completed. MD still at bedside. Pt. To return to cath lab now. Pt. States "I still ache across my chest." NS bolus given now sec. Per MD orders. Pt. Wife aware of status now.

## 2022-06-06 NOTE — Progress Notes (Signed)
   06/06/22 1600  Clinical Encounter Type  Visited With Patient and family together  Visit Type Initial;Post-op  Referral From Nurse  Consult/Referral To [place] responded to request to provide support for patient and spouse.

## 2022-06-06 NOTE — Progress Notes (Signed)
[facility] Progress Note  Patient: Alan Campbell    IOX:[id number]  DOA: 06/04/2022     Date of Service: the patient was seen and examined on 06/06/2022  Chief Complaint  Patient presents with   Cough   Brief hospital course: Alan Campbell is a 71 y.o. male with medical history significant of hypertension, RCC, hypothyroidism, OSA not on CPAP, who presents to the ED 06/04/22 with complaints of nonproductive cough since 12/14, developed chest pain w/ deep breaths. Went to [facility], HR 40s, was sent to ED. 12/18: normotensive at 122/97 with heart rate of 26. EKG with second-degree AV block. Initial workup remarkable for troponin of 56, glucose of 104, TSH of 5.5. Influenza, COVID, RSV negative. Chest x-ray with no acute abnormalities. Dr. Clayborn Bigness of cardiology was consulted by EDP. Advised hold CCB and BB, as needed atropine, continue pacing pads , goal electrolytes K >4, Mg >2, no indication for pacemaker (temp or permanent) at this time, allow 24-48 hr washout period for CCB 12/19: still brady overnight, low as HR 28. BP stable.    Consultants:  Cardiology    Procedures: Successful Micra AV 2 leadless pacemaker implantation  done on 06/06/22     Assessment and Plan: Principal Problem:   Symptomatic bradycardia Active Problems:   Upper respiratory infection   Benign essential HTN   Hypothyroidism, adult   Lymphedema   Systolic murmur     Symptomatic bradycardia Differential of etiology includes medication induced (patient on verapamil, last dose last night) versus viral infection versus structural heart disease (although less likely).   Echocardiogram obtained in 2019 with no significant structural disease.  Troponin minimally elevated likely due to demand rather than ACS, as patient is only experiencing pleuritic chest pain.  No prior history of CAD with normal stress test in 2019.  Cardiology consulted, recs as follows:  hold CCB and BB goal electrolytes K >4, Mg >2,   Echocardiogram done report is pending Successful Micra AV 2 leadless pacemaker implantation  Repeat postprocedural 2D echocardiogram to rule out pericardial effusion     Upper respiratory infection Chest x-ray is negative.   COVID, influenza and RSV are negative.  RVP negative Supportive management   Benign essential HTN Hold home verapamil due to bradycardia Continue home valsartan and Lasix   Hypothyroidism, adult TSH within normal limits when corrected for patient's age. Continue home levothyroxine   Lymphedema +1 pitting edema on lower extremities bilaterally.  Patient states this is baseline. Continue home Lasix   Systolic murmur On examination, there is a systolic murmur that is low pitched and holosystolic at the bilateral upper sternal borders, however a decrescendo high-pitched systolic murmur in the apex.  Per chart review, he does have a history of a murmur.  Echocardiogram in 2019 with mild tricuspid and mitral valve regurgitation. Echocardiogram done, report is pending   Body mass index is 34.84 kg/m.  Interventions:       Diet: Heart healthy diet DVT Prophylaxis: Subcutaneous Lovenox   Advance goals of care discussion: Full code  Family Communication: family was NOT  present at bedside, at the time of interview.  The pt provided permission to discuss medical plan with the family. Opportunity was given to ask question and all questions were answered satisfactorily.   Disposition:  Pt is from Home, admitted with symptomatic bradycardia s/p Micra leadless pacemaker insertion done today on 06/06/2022, transferred to ICU for overnight monitoring, which precludes a safe discharge. Discharge to home, when cleared by cardiology.Marland Kitchen  Subjective: No significant events overnight, patient remained asymptomatic, denied any chest pain or palpitation, no shortness of breath.  Denied any headache or dizziness. Patient was awaiting for pacemaker insertion.  Physical  Exam: General:  alert oriented to time, place, and person.  Appear in no distress, affect appropriate Eyes: PERRLA ENT: Oral Mucosa Clear, moist  Neck: no JVD,  Cardiovascular: S1 and S2 Present, no Murmur, bradycardia Respiratory: good respiratory effort, Bilateral Air entry equal and Decreased, no Crackles, no wheezes Abdomen: Bowel Sound present, Soft and no tenderness,  Skin: no rashes Extremities: no Pedal edema, no calf tenderness Neurologic: without any new focal findings Gait not checked due to patient safety concerns  Vitals:   06/06/22 1408 06/06/22 1409 06/06/22 1410 06/06/22 1412  BP: 99/74   (!) 83/71  Pulse: 61 70 67 71  Resp: '16 16 14 11  '$ Temp:      TempSrc:      SpO2: 96% 96% 98% 97%  Weight:      Height:        Intake/Output Summary (Last 24 hours) at 06/06/2022 1656 Last data filed at 06/06/2022 1300 Gross per 24 hour  Intake --  Output 150 ml  Net -150 ml   Filed Weights   06/04/22 1231 06/04/22 1357 06/06/22 1026  Weight: 107 kg 113.3 kg 113.3 kg    Data Reviewed: I have personally reviewed and interpreted daily labs, tele strips, imagings as discussed above. I reviewed all nursing notes, pharmacy notes, vitals, pertinent old records I have discussed plan of care as described above with RN and patient/family.  CBC: Recent Labs  Lab 06/04/22 1253 06/05/22 0432  WBC 9.5 7.6  HGB 13.2 11.7*  HCT 40.3 35.6*  MCV 92.0 90.4  PLT 195 948   Basic Metabolic Panel: Recent Labs  Lab 06/04/22 1253 06/05/22 0432 06/06/22 0535  NA 142 141 140  K 3.6 3.4* 3.8  CL 111 111 112*  CO2 '24 24 23  '$ GLUCOSE 104* 115* 125*  BUN 22 22 29*  CREATININE 1.08 0.96 1.06  CALCIUM 9.0 8.7* 8.6*  MG 2.2  --   --     Studies: ECHOCARDIOGRAM LIMITED  Result Date: 06/06/2022    ECHOCARDIOGRAM LIMITED REPORT   Patient Name:   Alan Campbell BLANK Date of Exam: 06/06/2022 Medical Rec #:  [id number]     Height:       71.0 in Accession #:    [id number]    Weight:        249.8 lb Date of Birth:  06/15/1951    BSA:          2.317 m Patient Age:    43 years      BP:           74/49 mmHg Patient Gender: M             HR:           72 bpm. Exam Location:  [facility] Procedure: Limited Echo Indications:     R00.8 Other abnormalities of the heart  History:         Patient has prior history of Echocardiogram examinations, most                  recent 06/05/2022. Risk Factors:Hypertension and Dyslipidemia.  Sonographer:     Charmayne Sheer Referring Phys:  [place] Diagnosing Phys: Isaias Cowman MD IMPRESSIONS  1. A small pericardial effusion is present. The pericardial effusion is circumferential. FINDINGS  Pericardium: A small pericardial effusion is present. The pericardial effusion is circumferential. There is diastolic collapse of the right atrial wall. Additional Comments: Spectral Doppler performed.  Isaias Cowman MD Electronically signed by Isaias Cowman MD Signature Date/Time: 06/06/2022/3:46:52 PM    Final    CARDIAC CATHETERIZATION  Result Date: 06/06/2022 1.  Unsuccessful attempt to preform pericardiocentesis Recommendations 1.  300 cc bolus 2.  Continue to carefully monitor 3.  Repeat limited 2D echocardiogram in a.m.   EP PPM/ICD IMPLANT  Result Date: 06/06/2022 1.  Successful Micra AV 2 leadless pacemaker implantation Recommendations Repeat postprocedural 2D echocardiogram to rule out pericardial effusion    Scheduled Meds:  aspirin EC  81 mg Oral Daily   Chlorhexidine Gluconate Cloth  6 each Topical Daily   enoxaparin (LOVENOX) injection  0.5 mg/kg Subcutaneous Q24H   febuxostat  40 mg Oral Daily   irbesartan  150 mg Oral Daily   levothyroxine  150 mcg Oral Q0600   multivitamin with minerals  1 tablet Oral Daily   potassium chloride SA  20 mEq Oral Daily   rosuvastatin  40 mg Oral Daily   sodium chloride flush  3 mL Intravenous Q12H   sodium chloride flush  3 mL Intravenous Q12H   Continuous Infusions:  sodium chloride      PRN Meds: sodium chloride, acetaminophen **OR** acetaminophen, acetaminophen, hydrALAZINE, hydrALAZINE, ondansetron **OR** ondansetron (ZOFRAN) IV, ondansetron (ZOFRAN) IV, polyethylene glycol, sodium chloride flush  Time spent: 35 minutes  Author: Val Riles. MD [facility] 06/06/2022 4:56 PM  To reach On-call, see care teams to locate the attending and reach out to them via [email or web address]. If 7PM-7AM, please contact night-coverage If you still have difficulty reaching the attending provider, please page the [facility] (Director on Call) for [facility] on amion for assistance.

## 2022-06-06 NOTE — Progress Notes (Signed)
*  PRELIMINARY RESULTS* Echocardiogram 2D Echocardiogram has been performed.  Alan Campbell Alan Campbell 06/06/2022, 3:10 PM

## 2022-06-06 NOTE — Progress Notes (Signed)
  Transition of Care [facility]) Screening Note   Patient Details  Name: Alan Campbell Date of Birth: Jun 28, 1950   [facility] of [facility] CM/SW Contact:    Tiburcio Bash, LCSW Phone Number: 06/06/2022, 9:57 AM    Transition of Care Department [facility]) has reviewed patient and no TOC needs have been identified at this time. We will continue to monitor patient advancement through interdisciplinary progression rounds. If new patient transition needs arise, please place a TOC consult.  Kelby Fam, [place], MSW, [place]

## 2022-06-06 NOTE — Progress Notes (Signed)
Pt. Back to cath lab now.

## 2022-06-06 NOTE — Progress Notes (Signed)
Mobility Specialist - Progress Note   06/06/22 1349  Mobility  Activity Off unit  $Mobility charge 1 Mobility   Pt off unit during attempt. Will attempt at another date/time.   Gretchen Short  Mobility Specialist  06/06/22 1:50 PM

## 2022-06-07 ENCOUNTER — Other Ambulatory Visit: Payer: Self-pay

## 2022-06-07 ENCOUNTER — Encounter: Payer: Self-pay | Admitting: Cardiology

## 2022-06-07 ENCOUNTER — Inpatient Hospital Stay
Admit: 2022-06-07 | Discharge: 2022-06-07 | Disposition: A | Discharge status: Home / Self Care | Payer: Medicare Other | Attending: Cardiology | Admitting: Cardiology

## 2022-06-07 DIAGNOSIS — R001 Bradycardia, unspecified: Secondary | ICD-10-CM | POA: Diagnosis not present

## 2022-06-07 LAB — ECHOCARDIOGRAM COMPLETE
AR max vel: 1.32 cm2
AR max vel: 1.66 cm2
AV Area VTI: 1.43 cm2
AV Area VTI: 1.93 cm2
AV Area mean vel: 1.27 cm2
AV Area mean vel: 1.55 cm2
AV Mean grad: 27.3 mmHg
AV Mean grad: 19 mmHg
AV Peak grad: 47.6 mmHg
AV Peak grad: 32.9 mmHg
Ao pk vel: 3.45 m/s
Ao pk vel: 2.87 m/s
Area-P 1/2: 3.91 cm2
Area-P 1/2: 5.13 cm2
Height: 71 in
Height: 71 in
S' Lateral: 3.8 cm
S' Lateral: 3.1 cm
Weight: 3996.5 oz
Weight: 3996.5 oz

## 2022-06-07 LAB — BASIC METABOLIC PANEL
Anion gap: 11 (ref 5–15)
BUN: 45 mg/dL — ABNORMAL HIGH (ref 8–23)
CO2: 22 mmol/L (ref 22–32)
Calcium: 9.1 mg/dL (ref 8.9–10.3)
Chloride: 108 mmol/L (ref 98–111)
Creatinine, Ser: 2.02 mg/dL — ABNORMAL HIGH (ref 0.61–1.24)
GFR, Estimated: 34 mL/min — ABNORMAL LOW (ref >60)
Glucose, Bld: 153 mg/dL — ABNORMAL HIGH (ref 70–99)
Potassium: 4.5 mmol/L (ref 3.5–5.1)
Sodium: 141 mmol/L (ref 135–145)

## 2022-06-07 LAB — GLUCOSE, CAPILLARY: Glucose-Capillary: 138 mg/dL — ABNORMAL HIGH (ref 70–99)

## 2022-06-07 MED ORDER — VERAPAMIL HCL ER 120 MG PO TBCR
120.0000 mg | EXTENDED_RELEASE_TABLET | Freq: Every day | ORAL | Status: DC
  Filled 2022-06-07: qty 1

## 2022-06-07 MED ORDER — TRAZODONE HCL 50 MG PO TABS
25.0000 mg | ORAL_TABLET | Freq: Once | ORAL | Status: AC
  Administered 2022-06-07: 25 mg via ORAL
  Filled 2022-06-07: qty 1

## 2022-06-07 MED ORDER — VERAPAMIL HCL 120 MG PO TABS: 120.0000 mg | ORAL_TABLET | Freq: Every day | ORAL | Status: DC

## 2022-06-07 NOTE — Progress Notes (Signed)
Pt ambulation on monitor around unit. Pt able to independently get dressed and ambulate around unit. HR into 120s, remaining V-paced during ambulation. Pt having mild shortness of breath during ambulation. Dr Dwyane Dee updated on pt condition.

## 2022-06-07 NOTE — Progress Notes (Signed)
[facility] Cardiology  SUBJECTIVE: Patient laying in bed, reports doing well, denies chest pain or shortness of breath, wishes to go home   Vitals:   06/07/22 0000 06/07/22 0100 06/07/22 0200 06/07/22 0236  BP:    (!) 122/46  Pulse: 69 84 89 91  Resp: '17 19 16 19  '$ Temp:    98.5 F (36.9 C)  TempSrc:    Oral  SpO2: 98% 96% 97%   Weight:      Height:         Intake/Output Summary (Last 24 hours) at 06/07/2022 0835 Last data filed at 06/07/2022 0411 Gross per 24 hour  Intake 3061.33 ml  Output 450 ml  Net 2611.33 ml      PHYSICAL EXAM  General: Well developed, well nourished, in no acute distress HEENT:  Normocephalic and atramatic Neck:  No JVD.  Lungs: Clear bilaterally to auscultation and percussion. Heart: HRRR . Normal S1 and S2 without gallops or murmurs.  Abdomen: Bowel sounds are positive, abdomen soft and non-tender  Msk:  Back normal, normal gait. Normal strength and tone for age. Extremities: No clubbing, cyanosis or edema.   Neuro: Alert and oriented X 3. Psych:  Good affect, responds appropriately   LABS: Basic Metabolic Panel: Recent Labs    06/04/22 1253 06/05/22 0432 06/06/22 0535  NA 142 141 140  K 3.6 3.4* 3.8  CL 111 111 112*  CO2 '24 24 23  '$ GLUCOSE 104* 115* 125*  BUN 22 22 29*  CREATININE 1.08 0.96 1.06  CALCIUM 9.0 8.7* 8.6*  MG 2.2  --   --    Liver Function Tests: No results for input(s): "AST", "ALT", "ALKPHOS", "BILITOT", "PROT", "ALBUMIN" in the last 72 hours. No results for input(s): "LIPASE", "AMYLASE" in the last 72 hours. CBC: Recent Labs    06/04/22 1253 06/05/22 0432  WBC 9.5 7.6  HGB 13.2 11.7*  HCT 40.3 35.6*  MCV 92.0 90.4  PLT 195 176   Cardiac Enzymes: No results for input(s): "CKTOTAL", "CKMB", "CKMBINDEX", "TROPONINI" in the last 72 hours. BNP: Invalid input(s): "POCBNP" D-Dimer: No results for input(s): "DDIMER" in the last 72 hours. Hemoglobin A1C: No results for input(s): "HGBA1C" in the last 72  hours. Fasting Lipid Panel: No results for input(s): "CHOL", "HDL", "LDLCALC", "TRIG", "CHOLHDL", "LDLDIRECT" in the last 72 hours. Thyroid Function Tests: Recent Labs    06/04/22 1253  TSH 5.522*   Anemia Panel: No results for input(s): "VITAMINB12", "FOLATE", "FERRITIN", "TIBC", "IRON", "RETICCTPCT" in the last 72 hours.  ECHOCARDIOGRAM LIMITED  Result Date: 06/06/2022    ECHOCARDIOGRAM LIMITED REPORT   Patient Name:   Alan Campbell Date of Exam: 06/06/2022 Medical Rec #:  [id number]     Height:       71.0 in Accession #:    [id number]    Weight:       249.8 lb Date of Birth:  07/13/1950    BSA:          2.317 m Patient Age:    72 years      BP:           74/49 mmHg Patient Gender: M             HR:           72 bpm. Exam Location:  [facility] Procedure: Limited Echo Indications:     R00.8 Other abnormalities of the heart  History:         Patient has prior history of Echocardiogram  examinations, most                  recent 06/05/2022. Risk Factors:Hypertension and Dyslipidemia.  Sonographer:     Charmayne Sheer Referring Phys:  [place] Diagnosing Phys: Isaias Cowman MD IMPRESSIONS  1. A small pericardial effusion is present. The pericardial effusion is circumferential. FINDINGS  Pericardium: A small pericardial effusion is present. The pericardial effusion is circumferential. There is diastolic collapse of the right atrial wall. Additional Comments: Spectral Doppler performed.  Isaias Cowman MD Electronically signed by Isaias Cowman MD Signature Date/Time: 06/06/2022/3:46:52 PM    Final    CARDIAC CATHETERIZATION  Result Date: 06/06/2022 1.  Unsuccessful attempt to preform pericardiocentesis Recommendations 1.  300 cc bolus 2.  Continue to carefully monitor 3.  Repeat limited 2D echocardiogram in a.m.   EP PPM/ICD IMPLANT  Result Date: 06/06/2022 1.  Successful Micra AV 2 leadless pacemaker implantation Recommendations Repeat postprocedural 2D echocardiogram to  rule out pericardial effusion   ECHOCARDIOGRAM COMPLETE  Result Date: 06/05/2022    ECHOCARDIOGRAM REPORT   Patient Name:   Alan Campbell Date of Exam: 06/05/2022 Medical Rec #:  [id number]     Height:       71.0 in Accession #:    [id number]    Weight:       249.8 lb Date of Birth:  1951-05-10    BSA:          2.317 m Patient Age:    67 years      BP:           195/66 mmHg Patient Gender: M             HR:           30 bpm. Exam Location:  [facility] Procedure: 2D Echo, Cardiac Doppler and Color Doppler Indications:     Abnormal ECG R94.31  History:         Patient has no prior history of Echocardiogram examinations.                  Risk Factors:Hypertension. Systolic murmur.  Sonographer:     Sherrie Sport Referring Phys:  [id number] Jose [place] Diagnosing Phys: Isaias Cowman MD  Sonographer Comments: Suboptimal apical window and no subcostal window. IMPRESSIONS  1. Left ventricular ejection fraction, by estimation, is 55 to 60%. The left ventricle has normal function. The left ventricle has no regional wall motion abnormalities. Left ventricular diastolic parameters were normal.  2. Right ventricular systolic function is normal. The right ventricular size is normal.  3. The mitral valve is normal in structure. Mild mitral valve regurgitation. No evidence of mitral stenosis.  4. The aortic valve is normal in structure. Aortic valve regurgitation is moderate. No aortic stenosis is present.  5. The inferior vena cava is normal in size with greater than 50% respiratory variability, suggesting right atrial pressure of 3 mmHg. FINDINGS  Left Ventricle: Left ventricular ejection fraction, by estimation, is 55 to 60%. The left ventricle has normal function. The left ventricle has no regional wall motion abnormalities. The left ventricular internal cavity size was normal in size. There is  no left ventricular hypertrophy. Left ventricular diastolic parameters were normal. Right Ventricle: The right ventricular size is  normal. No increase in right ventricular wall thickness. Right ventricular systolic function is normal. Left Atrium: Left atrial size was normal in size. Right Atrium: Right atrial size was normal in size. Pericardium: There is no evidence of pericardial effusion.  Mitral Valve: The mitral valve is normal in structure. Mild mitral valve regurgitation. No evidence of mitral valve stenosis. Tricuspid Valve: The tricuspid valve is normal in structure. Tricuspid valve regurgitation is mild . No evidence of tricuspid stenosis. Aortic Valve: The aortic valve is normal in structure. Aortic valve regurgitation is moderate. No aortic stenosis is present. Aortic valve mean gradient measures 27.3 mmHg. Aortic valve peak gradient measures 47.6 mmHg. Aortic valve area, by VTI measures  1.43 cm. Pulmonic Valve: The pulmonic valve was normal in structure. Pulmonic valve regurgitation is not visualized. No evidence of pulmonic stenosis. Aorta: The aortic root is normal in size and structure. Venous: The inferior vena cava is normal in size with greater than 50% respiratory variability, suggesting right atrial pressure of 3 mmHg. IAS/Shunts: No atrial level shunt detected by color flow Doppler.  LEFT VENTRICLE PLAX 2D LVIDd:         5.30 cm LVIDs:         3.80 cm LV PW:         1.40 cm LV IVS:        1.40 cm LVOT diam:     2.00 cm LV SV:         112 LV SV Index:   49 LVOT Area:     3.14 cm  RIGHT VENTRICLE RV Basal diam:  3.70 cm RV Mid diam:    3.20 cm LEFT ATRIUM             Index        RIGHT ATRIUM           Index LA diam:        4.30 cm 1.86 cm/m   RA Area:     18.00 cm LA Vol (A2C):   91.4 ml 39.44 ml/m  RA Volume:   49.80 ml  21.49 ml/m LA Vol (A4C):   49.6 ml 21.40 ml/m LA Biplane Vol: 71.0 ml 30.64 ml/m  AORTIC VALVE AV Area (Vmax):    1.32 cm AV Area (Vmean):   1.27 cm AV Area (VTI):     1.43 cm AV Vmax:           345.00 cm/s AV Vmean:          246.333 cm/s AV VTI:            0.788 m AV Peak Grad:      47.6 mmHg  AV Mean Grad:      27.3 mmHg LVOT Vmax:         145.00 cm/s LVOT Vmean:        99.900 cm/s LVOT VTI:          0.358 m LVOT/AV VTI ratio: 0.45  AORTA Ao Root diam: 3.40 cm MITRAL VALVE                TRICUSPID VALVE MV Area (PHT): 3.91 cm     TR Peak grad:   25.4 mmHg MV Decel Time: 194 msec     TR Vmax:        252.00 cm/s MV E velocity: 84.40 cm/s MV A velocity: 140.00 cm/s  SHUNTS MV E/A ratio:  0.60         Systemic VTI:  0.36 m                             Systemic Diam: 2.00 cm Isaias Cowman MD Electronically signed by Isaias Cowman MD Signature  Date/Time: 06/05/2022/1:32:32 PM    Final      Echo  LVEF 55-60%, small pericardial effusion, improved 06/06/2022  TELEMETRY: Atrial sensing with ventricular pacing:  ASSESSMENT AND PLAN:  Principal Problem:   Symptomatic bradycardia Active Problems:   Benign essential HTN   Hypothyroidism, adult   Lymphedema   Upper respiratory infection   Systolic murmur   Bradycardia   Aortic stenosis, moderate    1.  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  Pericardial effusion status post Micra AV 2 implant, unsuccessful pericardiocentesis, patient remained clinically stable, normotensive overnight.  Repeat echo this morning showed overall improvement with less pericardial effusion, no evidence for pericardial tamponade. 3.  Mildly elevated troponin (56, 62), likely demand supply ischemia secondary to second-degree heart block  Recommendations  1.  Continue current medications 2.  Continue home blood pressure medications 3.  DC home after ambulation 4.  Follow-up with Dr. Clayborn Bigness within 1 week  Isaias Cowman, MD, PhD, [facility] 06/07/2022 8:35 AM

## 2022-06-07 NOTE — Discharge Instructions (Signed)
Please take HALF of home doses of verapamil and valsartan when you go home. So take '120mg'$  verpamil once daily and '150mg'$  of valsartan once daily. Please call Dr. Etta Quill office with questions or concerns in the interim.

## 2022-06-07 NOTE — Discharge Summary (Signed)
[facility] Discharge Summary   Patient: Alan Campbell:[id number]  PCP: [name], MD  Date of admission: 06/04/2022   Date of discharge:  06/07/2022     Discharge Diagnoses:  Principal Problem:   Symptomatic bradycardia Active Problems:   Upper respiratory infection   Benign essential HTN   Hypothyroidism, adult   Lymphedema   Systolic murmur   Bradycardia   Aortic stenosis, moderate   Admitted From: Home Disposition:  Home   Recommendations for Outpatient Follow-up:  PCP: in 1 week  Follow-up with cardiologist Dr. Clayborn Bigness within 1 week Follow up LABS/TEST:     Diet recommendation: Cardiac diet  Activity: The patient is advised to gradually reintroduce usual activities, as tolerated  Discharge Condition: stable  Code Status: Full code   History of present illness: As per the H and P dictated on admission, Hospital Course:  Alan Campbell is a 71 y.o. male with medical history significant of hypertension, RCC, hypothyroidism, OSA not on CPAP, who presents to the ED 06/04/22 with complaints of nonproductive cough since 12/14, developed chest pain w/ deep breaths. Went to [facility], HR 40s, was sent to ED. 12/18: normotensive at 122/97 with heart rate of 26. EKG with second-degree AV block. Initial workup remarkable for troponin of 56, glucose of 104, TSH of 5.5. Influenza, COVID, RSV negative. Chest x-ray with no acute abnormalities. Dr. Clayborn Bigness of cardiology was consulted by EDP. Advised hold CCB and BB, as needed atropine, continue pacing pads , goal electrolytes K >4, Mg >2, no indication for pacemaker (temp or permanent) at this time, allow 24-48 hr washout period for CCB 12/19: still brady overnight, low as HR 28. BP stable.     Assessment and plan # Symptomatic bradycardia Differential of etiology includes medication induced (patient on verapamil, last dose last night) versus viral infection versus structural heart disease (although less likely).    Echocardiogram obtained in 2019 with no significant structural disease.  Troponin minimally elevated likely due to demand rather than ACS, as patient is only experiencing pleuritic chest pain. No prior history of CAD with normal stress test in 2019.  Cardiology consulted, recs pacemaker insertion, hold CCB and BB, goal electrolytes K >4, Mg >2, TTE LVEF 55 to 60%, no wall motion abnormality, no any other acute findings. On 12/20 S/p Successful Micra AV 2 leadless pacemaker implantation. Repeat postprocedural 2D echocardiogram shows LVEF 6712%, grade 1 diastolic dysfunction, to rule out pericardial effusion. A small pericardial effusion is present. The pericardial  effusion is circumferential. There is no evidence of cardiac tamponade.  Patient was seen by cardiology before discharge, recommended to resume home medications.  Patient ambulated without any complaints and patient was cleared by cardiology to discharge home and follow-up as an outpatient.   # Upper respiratory infection, Chest x-ray is negative.  COVID, influenza and RSV are negative.  RVP negative.  Continued supportive management # Benign essential HTN, Hold home verapamil due to bradycardia during hospital stay. Continued home valsartan and Lasix.  After Micra leadless pacemaker insertion verapamil was resumed as per cardiology.  Patient was discharged home in recommended to follow with cardio and PCP in 1 week. # Hypothyroidism, adult, TSH within normal limits when corrected for patient's age.Continue home levothyroxine # Lymphedema +1 pitting edema on lower extremities bilaterally.  Patient states this is baseline. Continue home Lasix # Systolic murmur, On examination, there is a systolic murmur that is low pitched and holosystolic at the bilateral upper sternal borders, however a decrescendo high-pitched  systolic murmur in the apex.  Per chart review, he does have a history of a murmur.  Echocardiogram in 2019 with mild tricuspid and  mitral valve regurgitation. TTE negative for any significant valvular abnormality.   Body mass index is 34.84 kg/m.  Nutrition Interventions:   Patient was ambulatory without any assistance. On the day of the discharge the patient's vitals were stable, and no other acute medical condition were reported by patient. the patient was felt safe to be discharge at Home with Home health.  Consultants: Cardiology Procedures: Successful Micra AV 2 leadless pacemaker implantation  done on 06/06/22  Discharge Exam: General: Appear in no distress, no Rash; Oral Mucosa Clear, moist. Cardiovascular: S1 and S2 Present, no Murmur, Respiratory: normal respiratory effort, Bilateral Air entry present and no Crackles, no wheezes Abdomen: Bowel Sound present, Soft and no tenderness, no hernia Extremities: no Pedal edema, no calf tenderness Neurology: alert and oriented to time, place, and person affect appropriate.  Filed Weights   06/04/22 1231 06/04/22 1357 06/06/22 1026  Weight: 107 kg 113.3 kg 113.3 kg   Vitals:   06/07/22 0800 06/07/22 0900  BP:    Pulse: 90 90  Resp: (!) 24 (!) 21  Temp:    SpO2: 96% 95%    DISCHARGE MEDICATION: Allergies as of 06/07/2022   No Known Allergies      Medication List     TAKE these medications    aspirin EC 81 MG tablet Take 81 mg by mouth daily. Swallow whole.   Centrum Silver 50+Men Tabs Take 1 tablet by mouth daily.   Cinnamon 500 MG Tabs Take 1,000 mg by mouth daily.   CRANBERRY PO Take 4,200 mg by mouth daily.   febuxostat 40 MG tablet Commonly known as: ULORIC Take 1 tablet (40 mg total) by mouth daily.   fish oil-omega-3 fatty acids 1000 MG capsule Take 1,000 mg by mouth daily.   furosemide 20 MG tablet Commonly known as: LASIX TAKE ONE TABLET BY MOUTH TWICE A DAY What changed: when to take this   Klor-Con M20 20 MEQ tablet Generic drug: potassium chloride SA TAKE ONE TABLET BY MOUTH TWICE A DAY  TAKE ONLY WHEN YOU TAKE  FUROSEMIDE What changed: See the new instructions.   levothyroxine 150 MCG tablet Commonly known as: SYNTHROID Take 1 tablet (150 mcg total) by mouth daily.   rosuvastatin 40 MG tablet Commonly known as: CRESTOR Take 1 tablet (40 mg total) by mouth daily.   Testosterone 20.25 MG/ACT (1.62%) Gel Apply 2 each topically daily. 2 pumps daily   valsartan 320 MG tablet Commonly known as: DIOVAN TAKE ONE TABLET BY MOUTH DAILY   verapamil 240 MG CR tablet Commonly known as: CALAN-SR TAKE ONE TABLET BY MOUTH EVERY NIGHT AT BEDTIME   Vitamin D 50 MCG (2000 UT) Caps Take 1 capsule (2,000 Units total) by mouth daily.       No Known Allergies Discharge Instructions     Call MD for:  difficulty breathing, headache or visual disturbances   Complete by: As directed    Call MD for:  extreme fatigue   Complete by: As directed    Call MD for:  persistant dizziness or light-headedness   Complete by: As directed    Call MD for:  severe uncontrolled pain   Complete by: As directed    Call MD for:  temperature >100.4   Complete by: As directed    Diet - low sodium heart healthy   Complete by:  As directed    Discharge instructions   Complete by: As directed    Follow-up with PCP in 1 week  Follow-up with cardiologist Dr. Clayborn Bigness within 1 week   Increase activity slowly   Complete by: As directed    No wound care   Complete by: As directed        The results of significant diagnostics from this hospitalization (including imaging, microbiology, ancillary and laboratory) are listed below for reference.    Significant Diagnostic Studies: ECHOCARDIOGRAM COMPLETE  Result Date: 06/07/2022    ECHOCARDIOGRAM REPORT   Patient Name:   KAMEL HAVEN Date of Exam: 06/05/2022 Medical Rec #:  [id number]     Height:       71.0 in Accession #:    [id number]    Weight:       249.8 lb Date of Birth:  28-Jun-1950    BSA:          2.317 m Patient Age:    71 years      BP:           195/66 mmHg  Patient Gender: M             HR:           30 bpm. Exam Location:  [facility] Procedure: 2D Echo, Cardiac Doppler and Color Doppler Indications:     Abnormal ECG R94.31  History:         Patient has no prior history of Echocardiogram examinations.                  Risk Factors:Hypertension. Systolic murmur.  Sonographer:     Sherrie Sport Referring Phys:  [id number] Jose Persia Diagnosing Phys: Isaias Cowman MD  Sonographer Comments: Suboptimal apical window and no subcostal window. IMPRESSIONS  1. Left ventricular ejection fraction, by estimation, is 55 to 60%. The left ventricle has normal function. The left ventricle has no regional wall motion abnormalities. Left ventricular diastolic parameters were normal.  2. Right ventricular systolic function is normal. The right ventricular size is normal.  3. The mitral valve is normal in structure. Mild mitral valve regurgitation. No evidence of mitral stenosis.  4. The aortic valve is normal in structure. Moderate aortic valve stenosis.  5. The inferior vena cava is normal in size with greater than 50% respiratory variability, suggesting right atrial pressure of 3 mmHg. FINDINGS  Left Ventricle: Left ventricular ejection fraction, by estimation, is 55 to 60%. The left ventricle has normal function. The left ventricle has no regional wall motion abnormalities. The left ventricular internal cavity size was normal in size. There is  no left ventricular hypertrophy. Left ventricular diastolic parameters were normal. Right Ventricle: The right ventricular size is normal. No increase in right ventricular wall thickness. Right ventricular systolic function is normal. Left Atrium: Left atrial size was normal in size. Right Atrium: Right atrial size was normal in size. Pericardium: There is no evidence of pericardial effusion. Mitral Valve: The mitral valve is normal in structure. Mild mitral valve regurgitation. No evidence of mitral valve stenosis. Tricuspid Valve: The tricuspid  valve is normal in structure. Tricuspid valve regurgitation is mild . No evidence of tricuspid stenosis. Aortic Valve: The aortic valve is normal in structure. Moderate aortic stenosis is present. Aortic valve mean gradient measures 27.3 mmHg. Aortic valve peak gradient measures 47.6 mmHg. Aortic valve area, by VTI measures 1.43 cm. Pulmonic Valve: The pulmonic valve was normal in structure. Pulmonic valve regurgitation is not visualized.  No evidence of pulmonic stenosis. Aorta: The aortic root is normal in size and structure. Venous: The inferior vena cava is normal in size with greater than 50% respiratory variability, suggesting right atrial pressure of 3 mmHg. IAS/Shunts: No atrial level shunt detected by color flow Doppler.  LEFT VENTRICLE PLAX 2D LVIDd:         5.30 cm LVIDs:         3.80 cm LV PW:         1.40 cm LV IVS:        1.40 cm LVOT diam:     2.00 cm LV SV:         112 LV SV Index:   49 LVOT Area:     3.14 cm  RIGHT VENTRICLE RV Basal diam:  3.70 cm RV Mid diam:    3.20 cm LEFT ATRIUM             Index        RIGHT ATRIUM           Index LA diam:        4.30 cm 1.86 cm/m   RA Area:     18.00 cm LA Vol (A2C):   91.4 ml 39.44 ml/m  RA Volume:   49.80 ml  21.49 ml/m LA Vol (A4C):   49.6 ml 21.40 ml/m LA Biplane Vol: 71.0 ml 30.64 ml/m  AORTIC VALVE AV Area (Vmax):    1.32 cm AV Area (Vmean):   1.27 cm AV Area (VTI):     1.43 cm AV Vmax:           345.00 cm/s AV Vmean:          246.333 cm/s AV VTI:            0.788 m AV Peak Grad:      47.6 mmHg AV Mean Grad:      27.3 mmHg LVOT Vmax:         145.00 cm/s LVOT Vmean:        99.900 cm/s LVOT VTI:          0.358 m LVOT/AV VTI ratio: 0.45  AORTA Ao Root diam: 3.40 cm MITRAL VALVE                TRICUSPID VALVE MV Area (PHT): 3.91 cm     TR Peak grad:   25.4 mmHg MV Decel Time: 194 msec     TR Vmax:        252.00 cm/s MV E velocity: 84.40 cm/s MV A velocity: 140.00 cm/s  SHUNTS MV E/A ratio:  0.60         Systemic VTI:  0.36 m                              Systemic Diam: 2.00 cm Isaias Cowman MD Electronically signed by Isaias Cowman MD Signature Date/Time: 06/05/2022/1:32:32 PM    Final (Updated)    ECHOCARDIOGRAM LIMITED  Result Date: 06/06/2022    ECHOCARDIOGRAM LIMITED REPORT   Patient Name:   ANDERSEN IORIO Date of Exam: 06/06/2022 Medical Rec #:  [id number]     Height:       71.0 in Accession #:    [id number]    Weight:       249.8 lb Date of Birth:  1951/03/19    BSA:          2.317 m Patient Age:  71 years      BP:           74/49 mmHg Patient Gender: M             HR:           72 bpm. Exam Location:  [facility] Procedure: Limited Echo Indications:     R00.8 Other abnormalities of the heart  History:         Patient has prior history of Echocardiogram examinations, most                  recent 06/05/2022. Risk Factors:Hypertension and Dyslipidemia.  Sonographer:     Charmayne Sheer Referring Phys:  [place] Diagnosing Phys: Isaias Cowman MD IMPRESSIONS  1. A small pericardial effusion is present. The pericardial effusion is circumferential. FINDINGS  Pericardium: A small pericardial effusion is present. The pericardial effusion is circumferential. There is diastolic collapse of the right atrial wall. Additional Comments: Spectral Doppler performed.  Isaias Cowman MD Electronically signed by Isaias Cowman MD Signature Date/Time: 06/06/2022/3:46:52 PM    Final    CARDIAC CATHETERIZATION  Result Date: 06/06/2022 1.  Unsuccessful attempt to preform pericardiocentesis Recommendations 1.  300 cc bolus 2.  Continue to carefully monitor 3.  Repeat limited 2D echocardiogram in a.m.   EP PPM/ICD IMPLANT  Result Date: 06/06/2022 1.  Successful Micra AV 2 leadless pacemaker implantation Recommendations Repeat postprocedural 2D echocardiogram to rule out pericardial effusion   DG Chest Port 1 View  Result Date: 06/04/2022 CLINICAL DATA:  Dry cough and shortness of breath. EXAM: PORTABLE CHEST 1 VIEW  COMPARISON:  07/06/2068 FINDINGS: Defibrillator pads project over the mid and right chest. Lungs are adequately inflated without focal airspace consolidation or effusion. Cardiomediastinal silhouette and remainder the exam is unchanged. IMPRESSION: No active disease. Electronically Signed   By: Marin Olp M.D.   On: 06/04/2022 13:09    Microbiology: Recent Results (from the past 240 hour(s))  Resp panel by RT-PCR (RSV, Flu A&B, Covid) Anterior Nasal Swab     Status: None   Collection Time: 06/04/22  1:22 PM   Specimen: Anterior Nasal Swab  Result Value Ref Range Status   SARS Coronavirus 2 by RT PCR NEGATIVE NEGATIVE Final    Comment: (NOTE) SARS-CoV-2 target nucleic acids are NOT DETECTED.  The SARS-CoV-2 RNA is generally detectable in upper respiratory specimens during the acute phase of infection. The lowest concentration of SARS-CoV-2 viral copies this assay can detect is 138 copies/mL. A negative result does not preclude SARS-Cov-2 infection and should not be used as the sole basis for treatment or other patient management decisions. A negative result may occur with  improper specimen collection/handling, submission of specimen other than nasopharyngeal swab, presence of viral mutation(s) within the areas targeted by this assay, and inadequate number of viral copies(<138 copies/mL). A negative result must be combined with clinical observations, patient history, and epidemiological information. The expected result is Negative.  Fact Sheet for Patients:  EntrepreneurPulse.com.au  Fact Sheet for Healthcare Providers:  IncredibleEmployment.be  This test is no t yet approved or cleared by the [place] FDA and  has been authorized for detection and/or diagnosis of SARS-CoV-2 by FDA under an Emergency Use Authorization (EUA). This EUA will remain  in effect (meaning this test can be used) for the duration of the COVID-19 declaration under  Section 564(b)(1) of the Act, 21 U.S.C.section 360bbb-3(b)(1), unless the authorization is terminated  or revoked sooner.  Influenza A by PCR NEGATIVE NEGATIVE Final   Influenza B by PCR NEGATIVE NEGATIVE Final    Comment: (NOTE) The Xpert Xpress SARS-CoV-2/FLU/RSV plus assay is intended as an aid in the diagnosis of influenza from Nasopharyngeal swab specimens and should not be used as a sole basis for treatment. Nasal washings and aspirates are unacceptable for Xpert Xpress SARS-CoV-2/FLU/RSV testing.  Fact Sheet for Patients: EntrepreneurPulse.com.au  Fact Sheet for Healthcare Providers: IncredibleEmployment.be  This test is not yet approved or cleared by the [place] FDA and has been authorized for detection and/or diagnosis of SARS-CoV-2 by FDA under an Emergency Use Authorization (EUA). This EUA will remain in effect (meaning this test can be used) for the duration of the COVID-19 declaration under Section 564(b)(1) of the Act, 21 U.S.C. section 360bbb-3(b)(1), unless the authorization is terminated or revoked.     Resp Syncytial Virus by PCR NEGATIVE NEGATIVE Final    Comment: (NOTE) Fact Sheet for Patients: EntrepreneurPulse.com.au  Fact Sheet for Healthcare Providers: IncredibleEmployment.be  This test is not yet approved or cleared by the [place] FDA and has been authorized for detection and/or diagnosis of SARS-CoV-2 by FDA under an Emergency Use Authorization (EUA). This EUA will remain in effect (meaning this test can be used) for the duration of the COVID-19 declaration under Section 564(b)(1) of the Act, 21 U.S.C. section 360bbb-3(b)(1), unless the authorization is terminated or revoked.  Performed at [facility], [place], [place] [address]   Respiratory (~20 pathogens) panel by PCR     Status: None   Collection Time: 06/04/22  1:22 PM    Specimen: Nasopharyngeal Swab; Respiratory  Result Value Ref Range Status   Adenovirus NOT DETECTED NOT DETECTED Final   Coronavirus 229E NOT DETECTED NOT DETECTED Final    Comment: (NOTE) The Coronavirus on the Respiratory Panel, DOES NOT test for the novel  Coronavirus (2019 nCoV)    Coronavirus HKU1 NOT DETECTED NOT DETECTED Final   Coronavirus NL63 NOT DETECTED NOT DETECTED Final   Coronavirus OC43 NOT DETECTED NOT DETECTED Final   Metapneumovirus NOT DETECTED NOT DETECTED Final   Rhinovirus / Enterovirus NOT DETECTED NOT DETECTED Final   Influenza A NOT DETECTED NOT DETECTED Final   Influenza B NOT DETECTED NOT DETECTED Final   Parainfluenza Virus 1 NOT DETECTED NOT DETECTED Final   Parainfluenza Virus 2 NOT DETECTED NOT DETECTED Final   Parainfluenza Virus 3 NOT DETECTED NOT DETECTED Final   Parainfluenza Virus 4 NOT DETECTED NOT DETECTED Final   Respiratory Syncytial Virus NOT DETECTED NOT DETECTED Final   Bordetella pertussis NOT DETECTED NOT DETECTED Final   Bordetella Parapertussis NOT DETECTED NOT DETECTED Final   Chlamydophila pneumoniae NOT DETECTED NOT DETECTED Final   Mycoplasma pneumoniae NOT DETECTED NOT DETECTED Final    Comment: Performed at [facility], [place]. [address], [place], [place] [address]  Novel Coronavirus, NAA ([facility])     Status: None   Collection Time: 06/04/22  5:04 PM   Specimen: Nasopharyngeal(NP) swabs in vial transport medium   Nasopharynge  Previous  Result Value Ref Range Status   SARS-CoV-2, NAA Not Detected Not Detected Final    Comment: This nucleic acid amplification test was developed and its performance characteristics determined by [employer]. Nucleic acid amplification tests include RT-PCR and TMA. This test has not been FDA cleared or approved. This test has been authorized by FDA under an Emergency Use Authorization (EUA). This test is only authorized for the duration of  time the declaration that circumstances  exist justifying the authorization of the emergency use of in vitro diagnostic tests for detection of SARS-CoV-2 virus and/or diagnosis of COVID-19 infection under section 564(b)(1) of the Act, 21 U.S.C. 621HYQ-6(V) (1), unless the authorization is terminated or revoked sooner. When diagnostic testing is negative, the possibility of a false negative result should be considered in the context of a patient's recent exposures and the presence of clinical signs and symptoms consistent with COVID-19. An individual without symptoms of COVID-19 and who is not shedding SARS-CoV-2 virus wo uld expect to have a negative (not detected) result in this assay.   Surgical PCR screen     Status: None   Collection Time: 06/06/22  6:45 AM   Specimen: Nasal Mucosa; Nasal Swab  Result Value Ref Range Status   MRSA, PCR NEGATIVE NEGATIVE Final   Staphylococcus aureus NEGATIVE NEGATIVE Final    Comment: (NOTE) The Xpert SA Assay (FDA approved for NASAL specimens in patients 86 years of age and older), is one component of a comprehensive surveillance program. It is not intended to diagnose infection nor to guide or monitor treatment. Performed at [facility], [address] [place], [place] [address]   MRSA Next Gen by PCR, Nasal     Status: None   Collection Time: 06/06/22  4:16 PM   Specimen: Nasal Mucosa; Nasal Swab  Result Value Ref Range Status   MRSA by PCR Next Gen NOT DETECTED NOT DETECTED Final    Comment: (NOTE) The GeneXpert MRSA Assay (FDA approved for NASAL specimens only), is one component of a comprehensive MRSA colonization surveillance program. It is not intended to diagnose MRSA infection nor to guide or monitor treatment for MRSA infections. Test performance is not FDA approved in patients less than 26 years old. Performed at [facility], [address] [place], [place] [address]      Labs: CBC: Recent Labs  Lab 06/04/22 1253 06/05/22 0432  WBC 9.5  7.6  HGB 13.2 11.7*  HCT 40.3 35.6*  MCV 92.0 90.4  PLT 195 841   Basic Metabolic Panel: Recent Labs  Lab 06/04/22 1253 06/05/22 0432 06/06/22 0535 06/07/22 0809  NA 142 141 140 141  K 3.6 3.4* 3.8 4.5  CL 111 111 112* 108  CO2 '24 24 23 22  '$ GLUCOSE 104* 115* 125* 153*  BUN 22 22 29* 45*  CREATININE 1.08 0.96 1.06 2.02*  CALCIUM 9.0 8.7* 8.6* 9.1  MG 2.2  --   --   --    Liver Function Tests: No results for input(s): "AST", "ALT", "ALKPHOS", "BILITOT", "PROT", "ALBUMIN" in the last 168 hours. No results for input(s): "LIPASE", "AMYLASE" in the last 168 hours. No results for input(s): "AMMONIA" in the last 168 hours. Cardiac Enzymes: No results for input(s): "CKTOTAL", "CKMB", "CKMBINDEX", "TROPONINI" in the last 168 hours. BNP (last 3 results) Recent Labs    06/04/22 1253  BNP 559.1*   CBG: Recent Labs  Lab 06/06/22 1550  GLUCAP 138*    Time spent: 35 minutes  Signed:  Val Riles  [facility]  06/07/2022 10:55 AM

## 2022-06-07 NOTE — Progress Notes (Signed)
       CROSS COVER NOTE  NAME: Alan Campbell [name]: [id number] DOB : 1951/05/28    HPI/Events of Note   Request for sleep med. Nurse reports patient takes benadryl at home for sleep but request something stronger due to no sleep in 4 nights  Assessment and  Interventions   Assessment:  Plan: 25 mg trazodone ordered     Kathlene Cote NP   [facility]

## 2022-06-07 NOTE — Progress Notes (Signed)
*  PRELIMINARY RESULTS* Echocardiogram 2D Echocardiogram has been performed.  Alan Campbell 06/07/2022, 9:13 AM

## 2022-06-07 NOTE — Progress Notes (Signed)
Pt discharged to home via wife. All discharge instructions discussed with pt prior to discharge. Cardiology to bedside for examination proir to discharge. Changes made to medications, pt aware and verbalizing understanding. All questions answered at this time.

## 2022-06-08 ENCOUNTER — Telehealth: Payer: Self-pay | Admitting: Family Medicine

## 2022-06-08 ENCOUNTER — Telehealth: Payer: Self-pay | Admitting: *Deleted

## 2022-06-08 NOTE — Patient Outreach (Signed)
  Care Coordination [facility] Note Transition Care Management Follow-up Telephone Call Date of discharge and from where: [phone] [facility] How have you been since you were released from the hospital? I feel pretty good Any questions or concerns? No  Items Reviewed: Did the pt receive and understand the discharge instructions provided? Yes  Medications obtained and verified? Yes  Other? No  Any new allergies since your discharge? No  Dietary orders reviewed? No Do you have support at home? Yes   Home Care and Equipment/Supplies: Were home health services ordered? not applicable If so, what is the name of the agency? N/a  Has the agency set up a time to come to the patient's home? not applicable Were any new equipment or medical supplies ordered?  No What is the name of the medical supply agency? N/a Were you able to get the supplies/equipment? not applicable Do you have any questions related to the use of the equipment or supplies? No  Functional Questionnaire: (I = Independent and D = Dependent) ADLs: I  Bathing/Dressing- I  Meal Prep- I  Eating- I  Maintaining continence- I  Transferring/Ambulation- I  Managing Meds- I  Follow up appointments reviewed:  PCP Hospital f/u appt confirmed? Yes  Dr Ancil Boozer [phone] 11:20 [facility] f/u appt confirmed? Yes Dr Clayborn Bigness [phone] 10:00 Are transportation arrangements needed? No  If their condition worsens, is the pt aware to call PCP or go to the Emergency Dept.? Yes Was the patient provided with contact information for the PCP's office or ED? Yes Was to pt encouraged to call back with questions or concerns? Yes  SDOH assessments and interventions completed:   Yes  SDOH Screenings   Food Insecurity: No Food Insecurity (06/08/2022)  Housing: Low Risk  (06/08/2022)  Transportation Needs: No Transportation Needs (06/08/2022)  Alcohol Screen: Low Risk  (07/27/2021)  Depression (PHQ2-9): Low Risk  (06/04/2022)  Financial  Resource Strain: Low Risk  (07/27/2021)  Physical Activity: Inactive (07/27/2021)  Social Connections: Socially Integrated (07/27/2021)  Stress: No Stress Concern Present (07/27/2021)  Tobacco Use: Medium Risk (06/07/2022)     Care Coordination Interventions:  No Care Coordination interventions needed at this time.   Encounter Outcome:  Pt. Visit Completed    [facility] [facility] [phone]

## 2022-06-08 NOTE — Telephone Encounter (Signed)
Copied from [place] [id number]. Topic: Appointment Scheduling - Scheduling Inquiry for Clinic >> Jun 07, 2022  4:31 PM Tiffany B wrote: Reason for CRM: Patient had a pacemaker put in on 06/06/2022 and was advised by his cardiologist Dr. Isaias Cowman, MD to follow up with PCP next week. PCP has no available appointments next week, please advise patient directly.

## 2022-06-08 NOTE — Telephone Encounter (Signed)
I have schedule his hospital follow up with Dr Ancil Boozer for the 28th of Decemember @ 11:20

## 2022-06-13 NOTE — Progress Notes (Unsigned)
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   Increase in eructation: over the past few weeks, he denies heartburn or indigestion. He does not drink from straws, chews gum or drinks carbonated beverages. 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   Cyst of kidney, acquired 03/22/2021   Pain in joint of right knee 03/20/2021   Anemia 02/14/2021   Hypertension 02/14/2021   Posterior vitreous detachment, left eye 10/04/2020   Lymphedema 03/22/2020   Chronic venous insufficiency 03/22/2020   Morbid obesity ([place]) 02/29/2020   Right epiretinal membrane 10/05/2019   Posterior vitreous detachment of  right eye 10/05/2019   Nuclear sclerotic cataract of left eye 10/05/2019   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 10/05/2019   Tendinitis of foot 09/15/2019   Epididymal cyst 06/24/2016   Benign essential HTN 12/30/2014   Dyslipidemia 12/30/2014   Hypothyroidism, adult 08/67/6195   Dysmetabolic syndrome 09/32/6712   Arthritis, degenerative 12/30/2014   Perennial allergic rhinitis with seasonal variation 12/30/2014   Hypogonadism in male 12/30/2014   Patella-femoral syndrome 12/03/2014   Low back pain 04/03/2010   Vitamin D deficiency 04/14/2009   OP (osteoporosis) 05/05/2007    Past Surgical History:  Procedure Laterality Date   BACK SURGERY     COLONOSCOPY WITH PROPOFOL N/A 08/14/2019   Procedure: COLONOSCOPY WITH PROPOFOL;  Surgeon: Lin Landsman, MD;  [facility] END[facility];  Service: Gastroenterology;  Laterality: N/A;  PRIORITY 4   EYE SURGERY  2016   right cataract removal   IR RADIOLOGIST EVAL & MGMT  10/31/2021   IR RADIOLOGIST EVAL & MGMT  01/09/2022   LAMINECTOMY  1985   L-5   PACEMAKER LEADLESS INSERTION N/A 06/06/2022   Procedure: PACEMAKER LEADLESS INSERTION;  Surgeon: Isaias Cowman, MD;  Location: [facility];  Service: Cardiovascular;  Laterality: N/A;  following AP first Micra   PERICARDIOCENTESIS N/A 06/06/2022   Procedure: PERICARDIOCENTESIS;  Surgeon: Isaias Cowman, MD;  Location: [facility];  Service: Cardiovascular;  Laterality: N/A;   RADIOLOGY WITH ANESTHESIA N/A 11/29/2021   Procedure: CT MICROWAVE ABLATION;  Surgeon: Criselda Peaches, MD;  Location: [facility] ORS;  Service:  Radiology;  Laterality: N/A;   SPINE SURGERY  1985    Family History  Problem Relation Age of Onset   Heart disease Father    Early death Father    Hypothyroidism Sister    Thyroid disease Sister    Hypothyroidism Sister    Migraines Sister    Prostate cancer Neg Hx    Bladder Cancer Neg Hx    Kidney cancer Neg  Hx     Social History   Tobacco Use   Smoking status: Former    Packs/day: 0.50    Years: 5.00    Total pack years: 2.50    Types: Cigarettes    Quit date: 10/03/1970    Years since quitting: 51.7   Smokeless tobacco: Never   Tobacco comments:    Quit 1972  Substance Use Topics   Alcohol use: Yes    Alcohol/week: 4.0 standard drinks of alcohol    Types: 4 Standard drinks or equivalent per week     Current Outpatient Medications:    amitriptyline (ELAVIL) 25 MG tablet, Take 1 tablet by mouth at bedtime., Disp: , Rfl:    aspirin EC 81 MG tablet, Take 81 mg by mouth daily. Swallow whole., Disp: , Rfl:    Cholecalciferol (VITAMIN D) 2000 units CAPS, Take 1 capsule (2,000 Units total) by mouth daily., Disp: 30 capsule, Rfl: 0   Cinnamon 500 MG TABS, Take 1,000 mg by mouth daily., Disp: , Rfl:    clonazePAM (KLONOPIN) 1 MG tablet, Take 1 mg by mouth at bedtime as needed., Disp: , Rfl:    CRANBERRY PO, Take 4,200 mg by mouth daily., Disp: , Rfl:    febuxostat (ULORIC) 40 MG tablet, Take 1 tablet (40 mg total) by mouth daily., Disp: 90 tablet, Rfl: 1   fish oil-omega-3 fatty acids 1000 MG capsule, Take 1,000 mg by mouth daily., Disp: , Rfl:    furosemide (LASIX) 20 MG tablet, TAKE ONE TABLET BY MOUTH TWICE A DAY (Patient taking differently: Take 20 mg by mouth daily.), Disp: 180 tablet, Rfl: 1   levothyroxine (SYNTHROID) 150 MCG tablet, Take 1 tablet (150 mcg total) by mouth daily., Disp: 90 tablet, Rfl: 1   Multiple Vitamins-Minerals (CENTRUM SILVER 50+MEN) TABS, Take 1 tablet by mouth daily., Disp: , Rfl:    rosuvastatin (CRESTOR) 40 MG tablet, Take 1 tablet (40 mg total) by mouth daily., Disp: 90 tablet, Rfl: 1   Testosterone 20.25 MG/ACT (1.62%) GEL, Apply 2 each topically daily. 2 pumps daily, Disp: 75 g, Rfl: 5   traZODone (DESYREL) 50 MG tablet, Take 1 tablet by mouth at bedtime., Disp: , Rfl:    valsartan (DIOVAN) 320 MG tablet, TAKE ONE TABLET BY MOUTH DAILY, Disp: 90 tablet,  Rfl: 1   verapamil (CALAN-SR) 240 MG CR tablet, TAKE ONE TABLET BY MOUTH EVERY NIGHT AT BEDTIME, Disp: 90 tablet, Rfl: 1   potassium chloride SA (KLOR-CON M20) 20 MEQ tablet, Take 1 tablet (20 mEq total) by mouth 2 (two) times daily. When you take furosemide, Disp: 180 tablet, Rfl: 1  No Known Allergies  I personally reviewed active problem list, medication list, allergies, family history, social history, health maintenance with the patient/caregiver today.   ROS  Constitutional: Negative for fever or weight change.  Respiratory: Negative for cough and shortness of breath.   Cardiovascular: Negative for chest pain or palpitations.  Gastrointestinal: Negative for abdominal pain, no bowel changes.  Musculoskeletal: Negative for gait problem or joint swelling.  Skin: Negative for rash.  Neurological: Negative for dizziness or headache.  No other specific complaints in a complete review of systems (except as listed in HPI above).   Objective  Vitals:   06/14/22 1122  BP: 126/72  Pulse: 94  Resp: 18  Temp: 97.8 F (36.6 C)  TempSrc: Oral  SpO2: 96%  Weight: 242 lb 4.8 oz (109.9 kg)  Height: '5\' 11"'$  (1.803 m)    Body mass index is 33.79 kg/m.  Physical Exam  Constitutional: Patient appears well-developed and well-nourished. Obese  No distress.  HEENT: head atraumatic, normocephalic, pupils equal and reactive to light,, neck supple Cardiovascular: Normal rate, regular rhythm and normal heart sounds.  No murmur heard. 1 plus  BLE edema. Pulmonary/Chest: Effort normal and breath sounds normal. No respiratory distress. Abdominal: Soft.  There is no tenderness. Psychiatric: Patient has a normal mood and affect. behavior is normal. Judgment and thought content normal.    PHQ2/9:    06/14/2022   11:25 AM 06/04/2022    9:56 AM 01/17/2022    8:03 AM 10/04/2021   11:17 AM 08/01/2021    3:17 PM  Depression screen PHQ 2/9  Decreased Interest 0 0 0 0 0  Down, Depressed, Hopeless 0 0  0 0 0  PHQ - 2 Score 0 0 0 0 0  Altered sleeping 0 0 0  0  Tired, decreased energy 0 0 0  0  Change in appetite 0 0 0  0  Feeling bad or failure about yourself  0 0 0  0  Trouble concentrating 0 0 0  0  Moving slowly or fidgety/restless 0 0 0  0  Suicidal thoughts 0 0 0  0  PHQ-9 Score 0 0 0  0  Difficult doing work/chores  Not difficult at all   Not difficult at all    phq 9 is negative   Fall Risk:    06/14/2022   11:24 AM 06/04/2022    9:56 AM 01/17/2022    8:03 AM 10/04/2021   11:18 AM 08/01/2021    3:16 PM  Fall Risk   Falls in the past year? 0 0 0 0 0  Number falls in past yr:  0 0 0 0  Injury with Fall?  0 0  0  Risk for fall due to : No Fall Risks  No Fall Risks Medication side effect No Fall Risks  Follow up Falls prevention discussed;Education provided;Falls evaluation completed  Falls prevention discussed Falls prevention discussed Falls prevention discussed      Functional Status Survey: Is the patient deaf or have difficulty hearing?: No Does the patient have difficulty seeing, even when wearing glasses/contacts?: No Does the patient have difficulty concentrating, remembering, or making decisions?: No Does the patient have difficulty walking or climbing stairs?: No Does the patient have difficulty dressing or bathing?: No Does the patient have difficulty doing errands alone such as visiting a doctor's office or shopping?: No    Assessment & Plan   1. Hospital discharge follow-up  Some deconditioning, discussed importance to gradually resume regular level of physical activity  2. Acute kidney insufficiency  - BASIC METABOLIC PANEL WITH GFR  3. Pacemaker  Keep follow up with cardiologist

## 2022-06-14 ENCOUNTER — Encounter: Payer: Self-pay | Admitting: Family Medicine

## 2022-06-14 ENCOUNTER — Ambulatory Visit (INDEPENDENT_AMBULATORY_CARE_PROVIDER_SITE_OTHER): Payer: Medicare Other | Admitting: Family Medicine

## 2022-06-14 VITALS — BP 126/72 | HR 94 | Temp 97.8°F | Resp 18 | Ht 71.0 in | Wt 242.3 lb

## 2022-06-14 DIAGNOSIS — I10 Essential (primary) hypertension: Secondary | ICD-10-CM

## 2022-06-14 DIAGNOSIS — N289 Disorder of kidney and ureter, unspecified: Secondary | ICD-10-CM | POA: Diagnosis not present

## 2022-06-14 DIAGNOSIS — Z79899 Other long term (current) drug therapy: Secondary | ICD-10-CM

## 2022-06-14 DIAGNOSIS — Z950 Presence of cardiac pacemaker: Secondary | ICD-10-CM

## 2022-06-14 DIAGNOSIS — Z09 Encounter for follow-up examination after completed treatment for conditions other than malignant neoplasm: Secondary | ICD-10-CM

## 2022-06-14 MED ORDER — POTASSIUM CHLORIDE CRYS ER 20 MEQ PO TBCR: 20.0000 meq | EXTENDED_RELEASE_TABLET | Freq: Two times a day (BID) | ORAL | 1 refills | Status: DC

## 2022-06-15 LAB — BASIC METABOLIC PANEL WITH GFR
BUN/Creatinine Ratio: 23 (calc) — ABNORMAL HIGH (ref 6–22)
BUN: 28 mg/dL — ABNORMAL HIGH (ref 7–25)
CO2: 27 mmol/L (ref 20–32)
Calcium: 8.8 mg/dL (ref 8.6–10.3)
Chloride: 109 mmol/L (ref 98–110)
Creat: 1.2 mg/dL (ref 0.70–1.28)
Glucose, Bld: 103 mg/dL — ABNORMAL HIGH (ref 65–99)
Potassium: 4.1 mmol/L (ref 3.5–5.3)
Sodium: 145 mmol/L (ref 135–146)
eGFR: 64 mL/min/{1.73_m2} (ref >=60)

## 2022-06-19 ENCOUNTER — Ambulatory Visit: Payer: Self-pay

## 2022-06-19 ENCOUNTER — Ambulatory Visit (INDEPENDENT_AMBULATORY_CARE_PROVIDER_SITE_OTHER): Payer: Medicare Other | Admitting: Physician Assistant

## 2022-06-19 ENCOUNTER — Encounter: Payer: Self-pay | Admitting: Physician Assistant

## 2022-06-19 VITALS — BP 128/80 | HR 94 | Temp 97.9°F | Resp 16 | Ht 71.0 in | Wt 247.9 lb

## 2022-06-19 DIAGNOSIS — J9801 Acute bronchospasm: Secondary | ICD-10-CM | POA: Diagnosis not present

## 2022-06-19 MED ORDER — ALBUTEROL SULFATE HFA 108 (90 BASE) MCG/ACT IN AERS: 2.0000 | INHALATION_SPRAY | Freq: Four times a day (QID) | RESPIRATORY_TRACT | 0 refills | Status: AC | PRN

## 2022-06-19 MED ORDER — PREDNISONE 20 MG PO TABS: 40.0000 mg | ORAL_TABLET | Freq: Every day | ORAL | 0 refills | Status: AC

## 2022-06-19 NOTE — Progress Notes (Signed)
Acute Office Visit   Patient: Alan Campbell   DOB: [birth year]   72 y.o. Male  [name]: [id number] Visit Date: 06/19/2022  Today's healthcare provider: Dani Gobble Bridget Ruston, PA-C  Introduced myself to the patient as a Journalist, newspaper and provided education on APPs in clinical practice.    Chief Complaint  Patient presents with   Cough    Dry cough, onset for 2 weeks or more   Wheezing   Subjective    HPI HPI     Cough    Additional comments: Dry cough, onset for 2 weeks or more      Last edited by Salomon Fick, CMA on 06/19/2022  3:40 PM.        He is here with his wife who is helping with HPI  He reports lingering SOB since getting a pacemaker on 06/06/22 He reports dry cough, sleeplessness, fatigue, increased SOB when lying down Reports he has some swelling in his ankles but this is normal for him  Reports cough started while he was in the hospital  He was tested for RSV, COVID, and flu which all came back negative  He denies recent sick contacts  Interventions: used wife's inhaler which helped with wheezing and coughing, Delsym TID   Medications: Outpatient Medications Prior to Visit  Medication Sig   amitriptyline (ELAVIL) 25 MG tablet Take 1 tablet by mouth at bedtime.   aspirin EC 81 MG tablet Take 81 mg by mouth daily. Swallow whole.   Cholecalciferol (VITAMIN D) 2000 units CAPS Take 1 capsule (2,000 Units total) by mouth daily.   Cinnamon 500 MG TABS Take 1,000 mg by mouth daily.   clonazePAM (KLONOPIN) 1 MG tablet Take 1 mg by mouth at bedtime as needed.   CRANBERRY PO Take 4,200 mg by mouth daily.   febuxostat (ULORIC) 40 MG tablet Take 1 tablet (40 mg total) by mouth daily.   fish oil-omega-3 fatty acids 1000 MG capsule Take 1,000 mg by mouth daily.   furosemide (LASIX) 20 MG tablet TAKE ONE TABLET BY MOUTH TWICE A DAY (Patient taking differently: Take 20 mg by mouth daily.)   levothyroxine (SYNTHROID) 150 MCG tablet Take 1 tablet (150 mcg total) by  mouth daily.   Multiple Vitamins-Minerals (CENTRUM SILVER 50+MEN) TABS Take 1 tablet by mouth daily.   potassium chloride SA (KLOR-CON M20) 20 MEQ tablet Take 1 tablet (20 mEq total) by mouth 2 (two) times daily. When you take furosemide   rosuvastatin (CRESTOR) 40 MG tablet Take 1 tablet (40 mg total) by mouth daily.   Testosterone 20.25 MG/ACT (1.62%) GEL Apply 2 each topically daily. 2 pumps daily   traZODone (DESYREL) 50 MG tablet Take 1 tablet by mouth at bedtime.   valsartan (DIOVAN) 320 MG tablet TAKE ONE TABLET BY MOUTH DAILY   verapamil (CALAN-SR) 240 MG CR tablet TAKE ONE TABLET BY MOUTH EVERY NIGHT AT BEDTIME   No facility-administered medications prior to visit.    Review of Systems  Constitutional:  Positive for fatigue. Negative for chills, diaphoresis and fever.  HENT:  Positive for congestion (mild and worse at night). Negative for ear pain, postnasal drip, rhinorrhea, sinus pressure, sinus pain, sore throat and trouble swallowing.   Respiratory:  Positive for cough, shortness of breath and wheezing.   Gastrointestinal:  Negative for diarrhea, nausea and vomiting.  Musculoskeletal:  Positive for myalgias.  Neurological:  Negative for dizziness, light-headedness and headaches.  Objective    BP 128/80   Pulse 94   Temp 97.9 F (36.6 C) (Oral)   Resp 16   Ht '5\' 11"'$  (1.803 m)   Wt 247 lb 14.4 oz (112.4 kg)   SpO2 96%   BMI 34.58 kg/m    Physical Exam Vitals reviewed.  Constitutional:      General: He is awake.     Appearance: Normal appearance. He is well-developed and well-groomed.  HENT:     Head: Normocephalic and atraumatic.     Mouth/Throat:     Lips: Pink.     Mouth: Mucous membranes are moist.     Pharynx: Oropharynx is clear. Uvula midline. No pharyngeal swelling, oropharyngeal exudate, posterior oropharyngeal erythema or uvula swelling.  Eyes:     General: Lids are normal. Gaze aligned appropriately.     Extraocular Movements: Extraocular  movements intact.     Conjunctiva/sclera: Conjunctivae normal.  Cardiovascular:     Rate and Rhythm: Normal rate and regular rhythm.     Pulses: Normal pulses.     Heart sounds: Murmur heard.     Systolic murmur is present with a grade of 2/6.     No friction rub. No gallop.     Comments: Slight murmur heard at start of systolic  Pulmonary:     Effort: Pulmonary effort is normal.     Breath sounds: Normal breath sounds. No decreased air movement. No decreased breath sounds, wheezing, rhonchi or rales.  Musculoskeletal:     Cervical back: Normal range of motion and neck supple.     Right lower leg: No edema.     Left lower leg: No edema.  Lymphadenopathy:     Head:     Right side of head: No submental, submandibular or preauricular adenopathy.     Left side of head: No submental, submandibular or preauricular adenopathy.     Cervical:     Right cervical: No superficial or posterior cervical adenopathy.    Left cervical: No superficial or posterior cervical adenopathy.     Upper Body:     Right upper body: No supraclavicular adenopathy.     Left upper body: No supraclavicular adenopathy.  Neurological:     Mental Status: He is alert.  Psychiatric:        Attention and Perception: Attention and perception normal.        Mood and Affect: Mood and affect normal.        Speech: Speech normal.        Behavior: Behavior normal. Behavior is cooperative.       No results found for any visits on 06/19/22.  Assessment & Plan      No follow-ups on file.       Problem List Items Addressed This Visit   None Visit Diagnoses     Bronchospasm, acute    -  Primary Acute, new concern, ongoing PE and HPI are suspicious for acute bronchospasm at this time Will send in script for Prednisone 40 mg PO QD x 7 days and Albuterol inhaler to assist with bronchial irritation  Can continue OTC medications as desired to assist further with coughing Reviewed gradual return to activity and work  following pacemaker placement  Follow up as needed with PCP for persistent or progressing symptoms.          No follow-ups on file.   I, Izzah Pasqua E Yanette Tripoli, PA-C, have reviewed all documentation for this visit. The documentation on 06/19/22 for the exam,  diagnosis, procedures, and orders are all accurate and complete.   Talitha Givens, MHS, PA-C [facility]

## 2022-06-19 NOTE — Telephone Encounter (Signed)
  Chief Complaint: dry cough/wheezing Symptoms: SOB with exertion, dry cough Frequency: few days Pertinent Negatives: Patient denies fever, chest pain Disposition: '[]'$ ED /'[]'$ Urgent Care (no appt availability in office) / '[x]'$ Appointment(In office/virtual)/ '[]'$  [facility]/ '[]'$ Home Care/ '[]'$ Refused Recommended Disposition /'[]'$ [facility]/ '[]'$  Follow-up with PCP Additional Notes: tried wife's Xopenex and wheezing stopped Reason for Disposition  Wheezing is present  Answer Assessment - Initial Assessment Questions 1. ONSET: "When did the cough begin?"      *No Answer* 2. SEVERITY: "How bad is the cough today?"      frequent 3. SPUTUM: "Describe the color of your sputum" (none, dry cough; clear, white, yellow, green)     dry 4. HEMOPTYSIS: "Are you coughing up any blood?" If so ask: "How much?" (flecks, streaks, tablespoons, etc.)     No  5. DIFFICULTY BREATHING: "Are you having difficulty breathing?" If Yes, ask: "How bad is it?" (e.g., mild, moderate, severe)    - MILD: No SOB at rest, mild SOB with walking, speaks normally in sentences, can lie down, no retractions, pulse < 100.    - MODERATE: SOB at rest, SOB with minimal exertion and prefers to sit, cannot lie down flat, speaks in phrases, mild retractions, audible wheezing, pulse 100-120.    - SEVERE: Very SOB at rest, speaks in single words, struggling to breathe, sitting hunched forward, retractions, pulse > 120      Wheezing- SOB with exertion since 06/06/22 pacemaker insertion 6. FEVER: "Do you have a fever?" If Yes, ask: "What is your temperature, how was it measured, and when did it start?"     no 7. CARDIAC HISTORY: "Do you have any history of heart disease?" (e.g., heart attack, congestive heart failure)      pacemaker 8. LUNG HISTORY: "Do you have any history of lung disease?"  (e.g., pulmonary embolus, asthma, emphysema)     no 9. PE RISK FACTORS: "Do you have a history of blood clots?" (or: recent major  surgery, recent prolonged travel, bedridden)     N/a 10. OTHER SYMPTOMS: "Do you have any other symptoms?" (e.g., runny nose, wheezing, chest pain)       wheezing 11. PREGNANCY: "Is there any chance you are pregnant?" "When was your last menstrual period?"       N/a 12. TRAVEL: "Have you traveled out of the country in the last month?" (e.g., travel history, exposures)       N/a  Protocols used: Cough - Acute Non-Productive-A-AH

## 2022-06-21 NOTE — Telephone Encounter (Signed)
Erroneous encounter

## 2022-06-22 ENCOUNTER — Ambulatory Visit: Payer: Medicare Other

## 2022-06-28 ENCOUNTER — Other Ambulatory Visit: Payer: Self-pay | Admitting: Interventional Radiology

## 2022-06-28 DIAGNOSIS — N2889 Other specified disorders of kidney and ureter: Secondary | ICD-10-CM

## 2022-07-03 ENCOUNTER — Ambulatory Visit
Admission: RE | Admit: 2022-07-03 | Discharge: 2022-07-03 | Disposition: A | Discharge status: Home / Self Care | Payer: Medicare Other | Source: Ambulatory Visit | Attending: Interventional Radiology | Admitting: Interventional Radiology

## 2022-07-03 DIAGNOSIS — N2889 Other specified disorders of kidney and ureter: Secondary | ICD-10-CM

## 2022-07-03 LAB — POCT I-STAT CREATININE: Creatinine, Ser: 1.7 mg/dL — ABNORMAL HIGH (ref 0.61–1.24)

## 2022-07-03 MED ORDER — IOHEXOL 300 MG/ML  SOLN
75.0000 mL | Freq: Once | INTRAMUSCULAR | Status: AC | PRN
  Administered 2022-07-03: 75 mL via INTRAVENOUS

## 2022-07-10 ENCOUNTER — Telehealth: Payer: Self-pay | Admitting: Family Medicine

## 2022-07-10 NOTE — Telephone Encounter (Signed)
LVM for pt to rtn my call to re-schedule AWV on 07/31/22.

## 2022-07-12 ENCOUNTER — Ambulatory Visit
Admission: RE | Admit: 2022-07-12 | Discharge: 2022-07-12 | Disposition: A | Discharge status: Home / Self Care | Payer: Medicare Other | Source: Ambulatory Visit | Attending: Interventional Radiology | Admitting: Interventional Radiology

## 2022-07-12 HISTORY — PX: IR RADIOLOGIST EVAL & MGMT: IMG5224

## 2022-07-12 NOTE — Progress Notes (Signed)
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    Referring Physician(s): Hollice Espy, MD   History of Present Illness: Alan Campbell is a 72 y.o. 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  He underwent percutaneous microwave ablation of the upper pole AML with concurrent biopsy and percutaneous microwave ablation of the lower pole enhancing renal mass on 11/29/2021.  He was discharged home the following day in good condition.  His recovery was uneventful.  He had minimal pain, no issues with dysuria, hematuria or other symptoms.  He has returned to work and is at full activities and has been for several weeks.  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  Of course, renal cell carcinomas are known to have areas of oncocytic features and the false negative biopsy rate is nearly 50%.  However, these biopsy results are good news.  Since our last clinic visit, Mr. Alan Campbell developed symptomatic bradycardia and required emergent pacemaker placement on 20 December.  He is recovering well and back to working a few days per week.  However, he has decided to enter retirement again.  He has no flank pain, hematuria or other symptoms related to the urinary tract.  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 Moderate to severe atypia, edge involved.   Hyperlipidemia    Hypertension    Hypothyroidism    Low back pain    Migraine    Obesity    OP (osteoporosis)    OSA (obstructive sleep apnea)    Primary osteoarthritis  of both knees 02/18/7901   Systolic murmur 40/97/3532   Thyroid disease    Vitamin D deficiency    Vitreomacular adhesion of left eye 10/05/2019    Past Surgical History:  Procedure Laterality Date   BACK SURGERY     COLONOSCOPY WITH PROPOFOL N/A 08/14/2019   Procedure: COLONOSCOPY WITH PROPOFOL;  Surgeon: Lin Landsman, MD;  Location: [facility];  Service: Gastroenterology;  Laterality: N/A;  PRIORITY 4   EYE SURGERY  2016   right cataract removal   IR RADIOLOGIST EVAL & MGMT  10/31/2021   IR RADIOLOGIST EVAL & MGMT  01/09/2022   LAMINECTOMY  1985   L-5   PACEMAKER LEADLESS INSERTION N/A 06/06/2022   Procedure: PACEMAKER LEADLESS INSERTION;  Surgeon: Isaias Cowman, MD;  Location: [facility];  Service: Cardiovascular;  Laterality: N/A;  following AP first Micra   PERICARDIOCENTESIS N/A 06/06/2022   Procedure: PERICARDIOCENTESIS;  Surgeon: Isaias Cowman, MD;  Location: [facility];  Service: Cardiovascular;  Laterality: N/A;   RADIOLOGY WITH ANESTHESIA N/A 11/29/2021   Procedure: CT MICROWAVE ABLATION;  Surgeon: Criselda Peaches, MD;  Location: [facility];  Service: Radiology;  Laterality: N/A;   SPINE SURGERY  1985    Allergies: Patient has no known allergies.  Medications: Prior to Admission medications   Medication Sig Start Date End Date Taking? Authorizing Provider  albuterol (VENTOLIN HFA) 108 (90 Base) MCG/ACT inhaler Inhale 2 puffs into the lungs every 6 (six)  hours as needed for wheezing or shortness of breath. 06/19/22   Mecum, Erin E, PA-C  amitriptyline (ELAVIL) 25 MG tablet Take 1 tablet by mouth at bedtime. 06/13/22 06/13/23  [provider]  aspirin EC 81 MG tablet Take 81 mg by mouth daily. Swallow whole.    [provider]  Cholecalciferol (VITAMIN D) 2000 units CAPS Take 1 capsule (2,000 Units total) by mouth daily. 07/07/15   [name], MD  Cinnamon 500 MG TABS Take 1,000 mg by mouth daily.    [provider]  clonazePAM (KLONOPIN) 1 MG tablet Take 1 mg by mouth at bedtime as needed. 06/13/22   [provider]  CRANBERRY PO Take 4,200 mg by mouth daily.    [provider]  febuxostat (ULORIC) 40 MG tablet Take 1 tablet (40 mg total) by mouth daily. 01/17/22   [name], MD  fish oil-omega-3 fatty acids 1000 MG capsule Take 1,000 mg by mouth daily.    [provider]  furosemide (LASIX) 20 MG tablet TAKE ONE TABLET BY MOUTH TWICE A DAY Patient taking differently: Take 20 mg by mouth daily. 02/09/22   [name], MD  levothyroxine (SYNTHROID) 150 MCG tablet Take 1 tablet (150 mcg total) by mouth daily. 02/12/22   [name], MD  Multiple Vitamins-Minerals (CENTRUM SILVER 50+MEN) TABS Take 1 tablet by mouth daily.    [provider]  potassium chloride SA (KLOR-CON M20) 20 MEQ tablet Take 1 tablet (20 mEq total) by mouth 2 (two) times daily. When you take furosemide 06/14/22   [name], MD  rosuvastatin (CRESTOR) 40 MG tablet Take 1 tablet (40 mg total) by mouth daily. 05/22/22   [name], MD  Testosterone 20.25 MG/ACT (1.62%) GEL Apply 2 each topically daily. 2 pumps daily 11/30/20   [name], MD  traZODone (DESYREL) 50 MG tablet Take 1 tablet by mouth at bedtime. 06/13/22 06/13/23  [provider]  valsartan (DIOVAN) 320 MG tablet TAKE ONE TABLET BY MOUTH DAILY 04/03/22   Ancil Boozer, Drue Stager, MD  verapamil (CALAN-SR) 240 MG CR tablet TAKE ONE TABLET BY MOUTH EVERY NIGHT AT BEDTIME 04/03/22   [name], MD     Family History  Problem Relation Age of Onset   Heart disease Father    Early death Father    Hypothyroidism Sister    Thyroid disease Sister    Hypothyroidism Sister    Migraines Sister    Prostate cancer Neg Hx    Bladder Cancer Neg Hx    Kidney cancer Neg Hx     Social History   Socioeconomic History   Marital status: Married    Spouse name: donna   Number of children: 0   Years of  education: Not on file   Highest education level: Not on file  Occupational History   Occupation: Investment banker, corporate   Tobacco Use   Smoking status: Former    Packs/day: 0.50    Years: 5.00    Total pack years: 2.50    Types: Cigarettes    Quit date: 10/03/1970    Years since quitting: 51.8   Smokeless tobacco: Never   Tobacco comments:    Quit 1972  Vaping Use   Vaping Use: Never used  Substance and Sexual Activity   Alcohol use: Yes    Alcohol/week: 4.0 standard drinks of alcohol    Types: 4 Standard drinks or equivalent per week   Drug use: No   Sexual activity: Yes    Partners: Female  Other Topics Concern   Not on file  Social History Narrative   Married since 1974, no children but they helped raise Tretha Sciara ( wife's niece), also helped raise their God-daughter Lyndee Leo   He is planning on retiring January 2021   Social Determinants of Health   Financial Resource Strain: Low Risk  (07/27/2021)   Overall Financial Resource Strain (CARDIA)    Difficulty of Paying Living Expenses: Not hard at all  Food Insecurity: No Food Insecurity (06/08/2022)   Hunger Vital Sign    Worried About Running Out of Food in the Last Year: Never true    [place] in the Last Year: Never true  Transportation Needs: No Transportation Needs (06/08/2022)   PRAPARE - Hydrologist (Medical): No    Lack of Transportation (Non-Medical): No  Physical Activity: Inactive (07/27/2021)   Exercise Vital Sign    Days of Exercise per Week: 0 days    Minutes of Exercise per Session: 0 min  Stress: No Stress Concern Present (07/27/2021)   [place]    Feeling of Stress : Not at all  Social Connections: [place] (07/27/2021)   Social Connection and Isolation Panel [NHANES]    Frequency of Communication with Friends and Family: More than three times a week    Frequency of Social Gatherings with Friends  and Family: Three times a week    Attends Religious Services: More than 4 times per year    Active Member of Clubs or Organizations: Yes    Attends Music therapist: More than 4 times per year    Marital Status: Married    ECOG Status: 0 - Asymptomatic  Review of Systems  Review of Systems: A 12 point ROS discussed and pertinent positives are indicated in the HPI above.  All other systems are negative.   Physical Exam No direct physical exam was performed (except for noted visual exam findings with Video Visits).    Vital Signs: There were no vitals taken for this visit.  Imaging: CT ABDOMEN W WO CONTRAST  Result Date: 07/04/2022 CLINICAL DATA:  Status post microwave ablation of upper pole right renal angiomyolipoma and lower pole right renal presumed renal cell carcinoma on 11/29/2021. Pacemaker. Asymptomatic. * Tracking Code: BO * EXAM: CT ABDOMEN WITHOUT AND WITH CONTRAST TECHNIQUE: Multidetector CT imaging of the abdomen was performed following the standard protocol before and following the bolus administration of intravenous contrast. RADIATION DOSE REDUCTION: This exam was performed according to the departmental dose-optimization program which includes automated exposure control, adjustment of the mA and/or kV according to patient size and/or use of iterative reconstruction technique. CONTRAST:  61m OMNIPAQUE IOHEXOL 300 MG/ML  SOLN COMPARISON:  Images from ablation 11/29/2021. Pre procedure MRI of 10/21/2021 FINDINGS: Lower chest: Areas of peribronchovascular interstitial thickening in the right middle and anterior right lower lobes on 06/03. A subsolid peribronchovascular right lower lobe pulmonary nodule on 2/3 measures 2.6 x 2.1 cm. Suspect a solid right lower lobe 5 mm peribronchovascular nodule on 05/03. Dependent left lower lobe atelectasis. Mild cardiomegaly with small pericardial effusion. Aortic valve calcification. Left circumflex coronary artery calcification.  Small left pleural effusion is new since 10/21/2021 MRI. Wireless pacer. Hepatobiliary: Benign liver lesions were detailed on prior MRI. No suspicious superimposed abnormality. Normal gallbladder, without biliary ductal dilatation. Pancreas: Normal, without mass or ductal dilatation. Spleen: Normal in size, without focal abnormality. Adrenals/Urinary Tract: Mild left adrenal thickening. Normal  right adrenal gland. No renal calculi or hydronephrosis.  No abdominal ureteric calculi. Left kidney: 4 mm interpolar left renal angiomyolipoma. Posterior interpolar 7 mm cyst. Right kidney: 1.3 cm upper pole right renal cyst. The ablated upper pole right renal angiomyolipoma measures 4.1 x 3.6 cm on 59/11 versus 4.4 x 3.3 cm on the prior MRI. No surrounding hemorrhage or other acute complication. Lower pole right renal ablation site measures 3.4 x 2. 4 cm on 82/6, encompassing the 2.5 x 2.4 cm renal cell carcinoma on the 10/21/2021 MRI. The central soft tissue density component measures on the order of 1.4 x 1.4 cm on 83/6 and demonstrates no post-contrast enhancement. No collecting system complication on delayed images. Stomach/Bowel: Normal stomach, without wall thickening. Normal colon and terminal ileum. Normal small bowel. 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 Aortic atherosclerosis. Patent renal veins. Small abdominal retroperitoneal nodes are similar, not pathologic by size criteria. Other: No ascites. Musculoskeletal: Degenerative disc disease at multiple lumbar levels. No suspicious osseous lesion. IMPRESSION: 1. Upper and lower pole right renal ablation sites, without acute complication. 2. The lower pole right renal ablation site encompasses the entire presumed renal cell carcinoma, which is decreased in size and demonstrates no residual postcontrast enhancement. 3. No evidence of abdominal metastatic disease. 4. Right lung base areas of ground-glass and peribronchovascular  interstitial thickening, new since 2009 CT. In the setting of recent pacer, new left pleural effusion and pericardial effusion, these could represent areas of pulmonary edema. However, a right lower lobe mixed attenuation nodule is suspected. Consider chest CT delineation. If there is a clinical concern of fluid overload, consider diuresis prior to chest CT. 5. Celiac ectasia, similar. 6.  Aortic Atherosclerosis (ICD10-I70.0). 7. Aortic valvular calcifications. Consider echocardiography to evaluate for valvular dysfunction. Electronically Signed   By: Abigail Miyamoto M.D.   On: 07/04/2022 10:39    Labs:  CBC: Recent Labs    11/23/21 0818 01/17/22 0858 06/04/22 1253 06/05/22 0432  WBC 6.2 6.9 9.5 7.6  HGB 13.0 12.3* 13.2 11.7*  HCT 37.7* 36.0* 40.3 35.6*  PLT 215 201 195 176    COAGS: Recent Labs    11/23/21 0818  INR 1.0    BMP: Recent Labs    06/04/22 1253 06/05/22 0432 06/06/22 0535 06/07/22 0809 06/14/22 1201 07/03/22 1526  NA 142 141 140 141 145  --   K 3.6 3.4* 3.8 4.5 4.1  --   CL 111 111 112* 108 109  --   CO2 '24 24 23 22 27  '$ --   GLUCOSE 104* 115* 125* 153* 103*  --   BUN 22 22 29* 45* 28*  --   CALCIUM 9.0 8.7* 8.6* 9.1 8.8  --   CREATININE 1.08 0.96 1.06 2.02* 1.20 1.70*  GFRNONAA >60 >60 >60 34*  --   --     LIVER FUNCTION TESTS: Recent Labs    01/17/22 0858  BILITOT 1.0  AST 24  ALT 28  PROT 6.4    TUMOR MARKERS: No results for input(s): "AFPTM", "CEA", "CA199", "CHROMGRNA" in the last 8760 hours.  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  Initial surveillance imaging demonstrates excellent imaging results with decreased size and absent enhancement in the oncocytic  renal neoplasm.  The angiomyolipoma also  appears well treated.  No complications identified.  We will continue with routine surveillance imaging.   1.)  Follow-up Renal protocol CT abdomen with contrast in mid July 2023 with accompanying clinic visit.      Electronically Signed: Criselda Peaches 07/12/2022, 3:07 PM   I spent a total of  10 Minutes in remote  clinical consultation, greater than 50% of which was counseling/coordinating care for right renal AML and right renal Oncocytic neoplasm.    Visit type: Audio only (telephone). Audio (no video) only due to patient preference. Alternative for in-person consultation at [facility], [address], [place], [place]. This visit type was conducted due to national recommendations for restrictions regarding the COVID-19 Pandemic (e.g. social distancing).  This format is felt to be most appropriate for this patient at this time.  All issues noted in this document were discussed and addressed.

## 2022-07-24 ENCOUNTER — Ambulatory Visit: Payer: Medicare Other | Admitting: Family Medicine

## 2022-07-24 ENCOUNTER — Telehealth: Payer: Self-pay | Admitting: Family Medicine

## 2022-07-24 NOTE — Telephone Encounter (Signed)
Tried to reach pt 3 different times to discuss if pt is indeed transferring to a different office (as stated in the letter that his wife signed and dropped off, he did not sign it). Each time I called it went straight to voicemail.

## 2022-07-31 ENCOUNTER — Ambulatory Visit: Payer: Medicare Other

## 2022-08-17 ENCOUNTER — Other Ambulatory Visit: Payer: Self-pay | Admitting: Family Medicine

## 2022-08-17 DIAGNOSIS — E039 Hypothyroidism, unspecified: Secondary | ICD-10-CM

## 2022-08-31 ENCOUNTER — Ambulatory Visit: Payer: Medicare Other

## 2022-09-26 ENCOUNTER — Other Ambulatory Visit
Admission: RE | Admit: 2022-09-26 | Discharge: 2022-09-26 | Disposition: A | Discharge status: Home / Self Care | Payer: Medicare Other | Source: Ambulatory Visit | Attending: Infectious Diseases | Admitting: Infectious Diseases

## 2022-09-26 DIAGNOSIS — E0590 Thyrotoxicosis, unspecified without thyrotoxic crisis or storm: Secondary | ICD-10-CM | POA: Diagnosis present

## 2022-09-26 DIAGNOSIS — R053 Chronic cough: Secondary | ICD-10-CM | POA: Diagnosis present

## 2022-09-26 DIAGNOSIS — I700 Atherosclerosis of aorta: Secondary | ICD-10-CM | POA: Diagnosis present

## 2022-09-26 DIAGNOSIS — I509 Heart failure, unspecified: Secondary | ICD-10-CM | POA: Insufficient documentation

## 2022-09-26 DIAGNOSIS — I10 Essential (primary) hypertension: Secondary | ICD-10-CM | POA: Insufficient documentation

## 2022-09-26 DIAGNOSIS — R9389 Abnormal findings on diagnostic imaging of other specified body structures: Secondary | ICD-10-CM | POA: Insufficient documentation

## 2022-09-26 DIAGNOSIS — R001 Bradycardia, unspecified: Secondary | ICD-10-CM | POA: Insufficient documentation

## 2022-09-26 DIAGNOSIS — I429 Cardiomyopathy, unspecified: Secondary | ICD-10-CM | POA: Insufficient documentation

## 2022-09-26 LAB — BRAIN NATRIURETIC PEPTIDE: B Natriuretic Peptide: 3764.8 pg/mL — ABNORMAL HIGH (ref 0.0–100.0)

## 2022-10-18 ENCOUNTER — Encounter (INDEPENDENT_AMBULATORY_CARE_PROVIDER_SITE_OTHER): Payer: Medicare Other | Admitting: Ophthalmology

## 2022-10-30 ENCOUNTER — Other Ambulatory Visit: Payer: Self-pay | Admitting: Infectious Diseases

## 2022-10-30 DIAGNOSIS — R053 Chronic cough: Secondary | ICD-10-CM

## 2022-11-02 ENCOUNTER — Ambulatory Visit
Admission: RE | Admit: 2022-11-02 | Discharge: 2022-11-02 | Disposition: A | Discharge status: Home / Self Care | Payer: Medicare Other | Source: Ambulatory Visit | Attending: Infectious Diseases | Admitting: Infectious Diseases

## 2022-11-02 DIAGNOSIS — R053 Chronic cough: Secondary | ICD-10-CM | POA: Diagnosis present

## 2022-11-14 DIAGNOSIS — Z950 Presence of cardiac pacemaker: Secondary | ICD-10-CM | POA: Insufficient documentation

## 2022-11-18 ENCOUNTER — Other Ambulatory Visit: Payer: Self-pay | Admitting: Family Medicine

## 2022-11-18 DIAGNOSIS — E039 Hypothyroidism, unspecified: Secondary | ICD-10-CM

## 2022-11-18 DIAGNOSIS — E785 Hyperlipidemia, unspecified: Secondary | ICD-10-CM

## 2022-11-21 DIAGNOSIS — I428 Other cardiomyopathies: Secondary | ICD-10-CM | POA: Insufficient documentation

## 2022-11-23 ENCOUNTER — Other Ambulatory Visit: Payer: Self-pay

## 2022-11-28 DIAGNOSIS — I5022 Chronic systolic (congestive) heart failure: Secondary | ICD-10-CM | POA: Insufficient documentation

## 2022-11-28 DIAGNOSIS — N183 Chronic kidney disease, stage 3 unspecified: Secondary | ICD-10-CM | POA: Insufficient documentation

## 2022-12-05 DIAGNOSIS — G4733 Obstructive sleep apnea (adult) (pediatric): Secondary | ICD-10-CM

## 2022-12-05 HISTORY — DX: Obstructive sleep apnea (adult) (pediatric): G47.33

## 2023-01-31 ENCOUNTER — Other Ambulatory Visit: Payer: Self-pay | Admitting: Interventional Radiology

## 2023-01-31 DIAGNOSIS — N2889 Other specified disorders of kidney and ureter: Secondary | ICD-10-CM

## 2023-02-07 ENCOUNTER — Ambulatory Visit
Admission: RE | Admit: 2023-02-07 | Discharge: 2023-02-07 | Disposition: A | Discharge status: Home / Self Care | Payer: Medicare Other | Source: Ambulatory Visit | Attending: Interventional Radiology | Admitting: Interventional Radiology

## 2023-02-07 DIAGNOSIS — N2889 Other specified disorders of kidney and ureter: Secondary | ICD-10-CM | POA: Insufficient documentation

## 2023-02-07 LAB — POCT I-STAT CREATININE: Creatinine, Ser: 2.7 mg/dL — ABNORMAL HIGH (ref 0.61–1.24)

## 2023-02-12 ENCOUNTER — Telehealth: Payer: Medicare Other

## 2023-03-06 ENCOUNTER — Ambulatory Visit (INDEPENDENT_AMBULATORY_CARE_PROVIDER_SITE_OTHER): Payer: Medicare Other | Admitting: Dermatology

## 2023-03-06 DIAGNOSIS — Z86018 Personal history of other benign neoplasm: Secondary | ICD-10-CM

## 2023-03-06 DIAGNOSIS — L578 Other skin changes due to chronic exposure to nonionizing radiation: Secondary | ICD-10-CM

## 2023-03-06 DIAGNOSIS — D229 Melanocytic nevi, unspecified: Secondary | ICD-10-CM

## 2023-03-06 DIAGNOSIS — L814 Other melanin hyperpigmentation: Secondary | ICD-10-CM

## 2023-03-06 DIAGNOSIS — L711 Rhinophyma: Secondary | ICD-10-CM

## 2023-03-06 DIAGNOSIS — L821 Other seborrheic keratosis: Secondary | ICD-10-CM

## 2023-03-06 DIAGNOSIS — Z79899 Other long term (current) drug therapy: Secondary | ICD-10-CM

## 2023-03-06 DIAGNOSIS — L719 Rosacea, unspecified: Secondary | ICD-10-CM

## 2023-03-06 DIAGNOSIS — W908XXA Exposure to other nonionizing radiation, initial encounter: Secondary | ICD-10-CM | POA: Diagnosis not present

## 2023-03-06 DIAGNOSIS — Z1283 Encounter for screening for malignant neoplasm of skin: Secondary | ICD-10-CM | POA: Diagnosis not present

## 2023-03-06 DIAGNOSIS — D1801 Hemangioma of skin and subcutaneous tissue: Secondary | ICD-10-CM

## 2023-03-06 DIAGNOSIS — L304 Erythema intertrigo: Secondary | ICD-10-CM

## 2023-03-06 DIAGNOSIS — L813 Cafe au lait spots: Secondary | ICD-10-CM

## 2023-03-06 DIAGNOSIS — Z7189 Other specified counseling: Secondary | ICD-10-CM

## 2023-03-06 NOTE — Progress Notes (Signed)
   Follow-Up Visit   Subjective  Alan Campbell is a 72 y.o. male who presents for the following: Yearly Skin Cancer Screening and Full Body Skin Exam, hx of Dysplastic nevus   The patient presents for Total-Body Skin Exam (TBSE) for skin cancer screening and mole check. The patient has spots, moles and lesions to be evaluated, some may be new or changing and the patient may have concern these could be cancer.   The following portions of the chart were reviewed this encounter and updated as appropriate: medications, allergies, medical history  Review of Systems:  No other skin or systemic complaints except as noted in HPI or Assessment and Plan.  Objective  Well appearing patient in no apparent distress; mood and affect are within normal limits.  A full examination was performed including scalp, head, eyes, ears, nose, lips, neck, chest, axillae, abdomen, back, buttocks, bilateral upper extremities, bilateral lower extremities, hands, feet, fingers, toes, fingernails, and toenails. All findings within normal limits unless otherwise noted below.   Relevant physical exam findings are noted in the Assessment and Plan.    Assessment & Plan   SKIN CANCER SCREENING PERFORMED TODAY.  ACTINIC DAMAGE - Chronic condition, secondary to cumulative UV/sun exposure - diffuse scaly erythematous macules with underlying dyspigmentation - Recommend daily broad spectrum sunscreen SPF 30+ to sun-exposed areas, reapply every 2 hours as needed.  - Staying in the shade or wearing long sleeves, sun glasses (UVA+UVB protection) and wide brim hats (4-inch brim around the entire circumference of the hat) are also recommended for sun protection.  - Call for new or changing lesions.  LENTIGINES, SEBORRHEIC KERATOSES, HEMANGIOMAS - Benign normal skin lesions - Benign-appearing - Call for any changes  MELANOCYTIC NEVI - Tan-brown and/or pink-flesh-colored symmetric macules and papules - Benign appearing on  exam today - Observation - Call clinic for new or changing moles - Recommend daily use of broad spectrum spf 30+ sunscreen to sun-exposed areas.   Rosacea Mid Face With Rhinophyma Discussed Laser treatment  Erythema intertrigo Infrabdominal area- mild erythema  Intertrigo is a chronic recurrent rash that occurs in skin fold areas that may be associated with friction; heat; moisture; yeast; fungus; and bacteria.  It is exacerbated by increased movement / activity; sweating; and higher atmospheric temperature.   Discussed treatment  Continue Skin Medicinals Iodoquinol 1%, Hydrocortisone 2.5%, Niacinamide 2% Cream twice a day to affected areas for up to two weeks.   Cafe au lait spots Left Medial Thigh Birthmarks Benign, observe.   HISTORY OF DYSPLASTIC NEVUS Left low back 2011 No evidence of recurrence today Recommend regular full body skin exams Recommend daily broad spectrum sunscreen SPF 30+ to sun-exposed areas, reapply every 2 hours as needed.  Call if any new or changing lesions are noted between office visits    Return in about 1 year (around 03/05/2024) for TBSE, hx of Dysplastic nevus .  IAngelique Holm, CMA, am acting as scribe for Armida Sans, MD .   Documentation: I have reviewed the above documentation for accuracy and completeness, and I agree with the above.  Armida Sans, MD

## 2023-03-06 NOTE — Patient Instructions (Signed)

## 2023-03-08 ENCOUNTER — Other Ambulatory Visit: Payer: Self-pay | Admitting: Interventional Radiology

## 2023-03-08 DIAGNOSIS — N2889 Other specified disorders of kidney and ureter: Secondary | ICD-10-CM

## 2023-03-12 ENCOUNTER — Encounter: Payer: Self-pay | Admitting: Dermatology

## 2023-04-03 DIAGNOSIS — Z952 Presence of prosthetic heart valve: Secondary | ICD-10-CM | POA: Insufficient documentation

## 2023-04-05 ENCOUNTER — Telehealth: Payer: Self-pay

## 2023-04-05 NOTE — Transitions of Care (Post Inpatient/ED Visit) (Signed)
04/05/2023  Name: Alan Campbell [name]: [id number] DOB: 1951-01-30  Today's TOC FU Call Status: Today's TOC FU Call Status:: Successful TOC FU Call Completed TOC FU Call Complete Date: 04/05/23 Patient's Name and Date of Birth confirmed.  Transition Care Management Follow-up Telephone Call Date of Discharge: 04/04/23 Discharge Facility: Other ([facility]) Name of Other ([facility]) Discharge Facility: [facility] Type of Discharge: Inpatient Admission Primary Inpatient Discharge Diagnosis:: TAVR How have you been since you were released from the hospital?: Better Any questions or concerns?: No  Items Reviewed: Did you receive and understand the discharge instructions provided?: Yes Medications obtained,verified, and reconciled?: Yes (Medications Reviewed) Any new allergies since your discharge?: No Dietary orders reviewed?: NA Do you have support at home?: Yes People in Home: spouse Name of Support/Comfort Primary Source: Lupita Leash  Medications Reviewed Today: Medications Reviewed Today     Reviewed by Redge Gainer, RN (Case Manager) on 04/05/23 at 1327  Med List Status: <None>   Medication Order Taking? Sig Documenting Provider Last Dose Status Informant  albuterol (VENTOLIN HFA) 108 (90 Base) MCG/ACT inhaler [id number]  Inhale 2 puffs into the lungs every 6 (six) hours as needed for wheezing or shortness of breath. Mecum, Erin E, PA-C  Active   amitriptyline (ELAVIL) 25 MG tablet [id number] No Take 1 tablet by mouth at bedtime. [provider] Taking Active   aspirin EC 81 MG tablet [id number] No Take 81 mg by mouth daily. Swallow whole. [provider] Taking Active Self  Cholecalciferol (VITAMIN D) 2000 units CAPS [id number] No Take 1 capsule (2,000 Units total) by mouth daily. Alba Cory, MD Taking Active Self  Cinnamon 500 MG TABS [id number] No Take 1,000 mg by mouth daily. [provider] Taking Active Self  clonazePAM (KLONOPIN) 1 MG  tablet [id number] No Take 1 mg by mouth at bedtime as needed. [provider] Taking Active   CRANBERRY PO [id number] No Take 4,200 mg by mouth daily. [provider] Taking Active Self  febuxostat (ULORIC) 40 MG tablet [id number] No Take 1 tablet (40 mg total) by mouth daily. Alba Cory, MD Taking Active Self  fish oil-omega-3 fatty acids 1000 MG capsule [id number] No Take 1,000 mg by mouth daily. [provider] Taking Active Self           Med Note Reather Littler D   Tue Jul 26, 2020 10:22 AM)    furosemide (LASIX) 20 MG tablet [id number] No TAKE ONE TABLET BY MOUTH TWICE A DAY  Patient taking differently: Take 20 mg by mouth daily.   Alba Cory, MD Taking Active Self, Pharmacy Records  levothyroxine (SYNTHROID) 150 MCG tablet [id number]  TAKE 1 TABLET BY MOUTH DAILY Carlynn Purl, Danna Hefty, MD  Active   Multiple Vitamins-Minerals (CENTRUM SILVER 50+MEN) TABS [id number] No Take 1 tablet by mouth daily. [provider] Taking Active Self  potassium chloride SA (KLOR-CON M20) 20 MEQ tablet [id number] No Take 1 tablet (20 mEq total) by mouth 2 (two) times daily. When you take furosemide Alba Cory, MD Taking Active   rosuvastatin (CRESTOR) 40 MG tablet [id number] No Take 1 tablet (40 mg total) by mouth daily. Alba Cory, MD Taking Active Self, Pharmacy Records  Testosterone 20.25 MG/ACT (1.62%) GEL [id number] No Apply 2 each topically daily. 2 pumps daily Alba Cory, MD Taking Active Self           Med Note Sharon Seller Jun 04, 2022  3:12 PM) Patient reports using infrequently  traZODone (DESYREL) 50 MG  tablet [id number] No Take 1 tablet by mouth at bedtime. [provider] Taking Active   valsartan (DIOVAN) 320 MG tablet [id number] No TAKE ONE TABLET BY MOUTH DAILY Carlynn Purl, Danna Hefty, MD Taking Active Self, Pharmacy Records  verapamil (CALAN-SR) 240 MG CR tablet [id number] No TAKE ONE TABLET BY MOUTH EVERY NIGHT AT BEDTIME Alba Cory, MD  Taking Active Self, Pharmacy Records            Home Care and Equipment/Supplies: Were Home Health Services Ordered?: NA Any new equipment or medical supplies ordered?: NA  Functional Questionnaire: Do you need assistance with bathing/showering or dressing?: No Do you need assistance with meal preparation?: No Do you need assistance with eating?: No Do you have difficulty maintaining continence: No Do you need assistance with getting out of bed/getting out of a chair/moving?: No Do you have difficulty managing or taking your medications?: No  Follow up appointments reviewed: PCP Follow-up appointment confirmed?: Yes Date of PCP follow-up appointment?: 03/29/23 Follow-up Provider: Clydie Braun Specialist [facility] Follow-up appointment confirmed?: Yes Date of Specialist follow-up appointment?: 04/22/23 Follow-Up Specialty Provider:: Dr. Elijah Birk - cardiology Do you need transportation to your follow-up appointment?: No Do you understand care options if your condition(s) worsen?: Yes-patient verbalized understanding   The patient declines enrollment  Deidre Ala, RN RN Care Manager [facility] [phone]

## 2023-04-11 ENCOUNTER — Telehealth (HOSPITAL_COMMUNITY): Payer: Self-pay

## 2023-04-11 ENCOUNTER — Encounter (HOSPITAL_COMMUNITY): Payer: Self-pay

## 2023-04-11 NOTE — Telephone Encounter (Signed)
Outside/paper referral received by Dr. Zebedee Iba from [facility]. Will fax over Physician order and request further documents. Insurance benefits and eligibility to be determined.

## 2023-04-11 NOTE — Telephone Encounter (Signed)
Attempted to call patient in regards to Cardiac Rehab - LM on VM Mailed letter 

## 2023-05-02 ENCOUNTER — Telehealth (HOSPITAL_COMMUNITY): Payer: Self-pay

## 2023-05-02 ENCOUNTER — Encounter: Payer: Medicare Other | Attending: Internal Medicine | Admitting: *Deleted

## 2023-05-02 DIAGNOSIS — Z48812 Encounter for surgical aftercare following surgery on the circulatory system: Secondary | ICD-10-CM | POA: Insufficient documentation

## 2023-05-02 DIAGNOSIS — Z952 Presence of prosthetic heart valve: Secondary | ICD-10-CM | POA: Insufficient documentation

## 2023-05-02 NOTE — Progress Notes (Signed)
Initial phone call completed. Diagnosis can be found in [facility] 10/15. EP Orientation scheduled for Wednesday 11/20 at 9:30.

## 2023-05-02 NOTE — Telephone Encounter (Signed)
No response from in regards to CR.

## 2023-05-08 VITALS — Ht 71.0 in | Wt 237.3 lb

## 2023-05-08 DIAGNOSIS — Z952 Presence of prosthetic heart valve: Secondary | ICD-10-CM

## 2023-05-08 DIAGNOSIS — Z48812 Encounter for surgical aftercare following surgery on the circulatory system: Secondary | ICD-10-CM | POA: Diagnosis not present

## 2023-05-08 NOTE — Patient Instructions (Signed)
Patient Instructions  Patient Details  Name: Alan Campbell [name]: [id number] Date of Birth: [birth year] Referring Provider:  Alwyn Pea, MD  Below are your personal goals for exercise, nutrition, and risk factors. Our goal is to help you stay on track towards obtaining and maintaining these goals. We will be discussing your progress on these goals with you throughout the program.  Initial Exercise Prescription:  Initial Exercise Prescription - 05/08/23 1100       Date of Initial Exercise RX and Referring Provider   Date 05/08/23    Referring Provider Dr. Dorothyann Peng, MD      Oxygen   Maintain Oxygen Saturation 88% or higher      Treadmill   MPH 1.6    Grade 0    Minutes 15    METs 2.23      Recumbant Bike   Level 1    RPM 50    Watts 15    Minutes 15    METs 1.7      NuStep   Level 2    SPM 80    Minutes 15    METs 1.7      Track   Laps 18    Minutes 15    METs 1.98      Prescription Details   Frequency (times per week) 3    Duration Progress to 30 minutes of continuous aerobic without signs/symptoms of physical distress      Intensity   THRR 40-80% of Max Heartrate 102-132    Ratings of Perceived Exertion 11-13    Perceived Dyspnea 0-4      Progression   Progression Continue to progress workloads to maintain intensity without signs/symptoms of physical distress.      Resistance Training   Training Prescription Yes    Weight 5 lb    Reps 10-15             Exercise Goals: Frequency: Be able to perform aerobic exercise two to three times per week in program working toward 2-5 days per week of home exercise.  Intensity: Work with a perceived exertion of 11 (fairly light) - 15 (hard) while following your exercise prescription.  We will make changes to your prescription with you as you progress through the program.   Duration: Be able to do 30 to 45 minutes of continuous aerobic exercise in addition to a 5 minute warm-up and a 5 minute  cool-down routine.   Nutrition Goals: Your personal nutrition goals will be established when you do your nutrition analysis with the dietician.  The following are general nutrition guidelines to follow: Cholesterol < 200mg /day Sodium < 1500mg /day Fiber: Men over 50 yrs - 30 grams per day  Personal Goals:  Personal Goals and Risk Factors at Admission - 05/02/23 1338       Core Components/Risk Factors/Patient Goals on Admission   Hypertension Yes    Intervention Provide education on lifestyle modifcations including regular physical activity/exercise, weight management, moderate sodium restriction and increased consumption of fresh fruit, vegetables, and low fat dairy, alcohol moderation, and smoking cessation.;Monitor prescription use compliance.    Expected Outcomes Short Term: Continued assessment and intervention until BP is < 140/71mm HG in hypertensive participants. < 130/46mm HG in hypertensive participants with diabetes, heart failure or chronic kidney disease.;Long Term: Maintenance of blood pressure at goal levels.    Lipids Yes    Intervention Provide education and support for participant on nutrition & aerobic/resistive exercise along with prescribed medications to  achieve LDL 70mg , HDL >40mg .    Expected Outcomes Short Term: Participant states understanding of desired cholesterol values and is compliant with medications prescribed. Participant is following exercise prescription and nutrition guidelines.;Long Term: Cholesterol controlled with medications as prescribed, with individualized exercise RX and with personalized nutrition plan. Value goals: LDL < 70mg , HDL > 40 mg.            Exercise Goals and Review:  Exercise Goals     Row Name 05/08/23 1049             Exercise Goals   Increase Physical Activity Yes       Intervention Provide advice, education, support and counseling about physical activity/exercise needs.;Develop an individualized exercise prescription  for aerobic and resistive training based on initial evaluation findings, risk stratification, comorbidities and participant's personal goals.       Expected Outcomes Short Term: Attend rehab on a regular basis to increase amount of physical activity.;Long Term: Exercising regularly at least 3-5 days a week.;Long Term: Add in home exercise to make exercise part of routine and to increase amount of physical activity.       Increase Strength and Stamina Yes       Intervention Develop an individualized exercise prescription for aerobic and resistive training based on initial evaluation findings, risk stratification, comorbidities and participant's personal goals.;Provide advice, education, support and counseling about physical activity/exercise needs.       Expected Outcomes Short Term: Increase workloads from initial exercise prescription for resistance, speed, and METs.;Short Term: Perform resistance training exercises routinely during rehab and add in resistance training at home;Long Term: Improve cardiorespiratory fitness, muscular endurance and strength as measured by increased METs and functional capacity ( )       Able to understand and use rate of perceived exertion (RPE) scale Yes       Intervention Provide education and explanation on how to use RPE scale       Expected Outcomes Short Term: Able to use RPE daily in rehab to express subjective intensity level;Long Term:  Able to use RPE to guide intensity level when exercising independently       Able to understand and use Dyspnea scale Yes       Intervention Provide education and explanation on how to use Dyspnea scale       Expected Outcomes Long Term: Able to use Dyspnea scale to guide intensity level when exercising independently;Short Term: Able to use Dyspnea scale daily in rehab to express subjective sense of shortness of breath during exertion       Knowledge and understanding of Target Heart Rate Range (THRR) Yes       Intervention  Provide education and explanation of THRR including how the numbers were predicted and where they are located for reference       Expected Outcomes Short Term: Able to state/look up THRR;Long Term: Able to use THRR to govern intensity when exercising independently;Short Term: Able to use daily as guideline for intensity in rehab       Able to check pulse independently Yes       Intervention Provide education and demonstration on how to check pulse in carotid and radial arteries.;Review the importance of being able to check your own pulse for safety during independent exercise       Expected Outcomes Short Term: Able to explain why pulse checking is important during independent exercise;Long Term: Able to check pulse independently and accurately       Understanding  of Exercise Prescription Yes       Intervention Provide education, explanation, and written materials on patient's individual exercise prescription       Expected Outcomes Short Term: Able to explain program exercise prescription;Long Term: Able to explain home exercise prescription to exercise independently

## 2023-05-08 NOTE — Progress Notes (Signed)
Cardiac Individual Treatment Plan  Patient Details  Name: Alan Campbell [name]: [id number] Date of Birth: 04/27/1951 Referring Provider:   Flowsheet Row Cardiac Rehab from 05/08/2023 in [facility] Cardiac and Pulmonary Rehab  Referring Provider Dr. Dorothyann Peng, MD       Initial Encounter Date:  Flowsheet Row Cardiac Rehab from 05/08/2023 in [facility] [place] Cardiac and Pulmonary Rehab  Date 05/08/23       Visit Diagnosis: S/P TAVR (transcatheter aortic valve replacement)  Patient's Home Medications on Admission:  Current Outpatient Medications:    Acetaminophen 500 MG capsule, Take by mouth., Disp: , Rfl:    albuterol (VENTOLIN HFA) 108 (90 Base) MCG/ACT inhaler, Inhale 2 puffs into the lungs every 6 (six) hours as needed for wheezing or shortness of breath., Disp: 6.7 g, Rfl: 0   allopurinol (ZYLOPRIM) 100 MG tablet, Take 50 mg by mouth daily., Disp: , Rfl:    amitriptyline (ELAVIL) 25 MG tablet, Take 1 tablet by mouth at bedtime. (Patient not taking: Reported on 05/02/2023), Disp: , Rfl:    aspirin EC 81 MG tablet, Take 81 mg by mouth daily. Swallow whole., Disp: , Rfl:    Cholecalciferol (VITAMIN D) 2000 units CAPS, Take 1 capsule (2,000 Units total) by mouth daily., Disp: 30 capsule, Rfl: 0   Cinnamon 500 MG TABS, Take 1,000 mg by mouth daily., Disp: , Rfl:    clonazePAM (KLONOPIN) 1 MG tablet, Take 1 mg by mouth at bedtime as needed., Disp: , Rfl:    CRANBERRY PO, Take 4,200 mg by mouth daily., Disp: , Rfl:    empagliflozin (JARDIANCE) 10 MG TABS tablet, Take 10 mg by mouth daily., Disp: , Rfl:    febuxostat (ULORIC) 40 MG tablet, Take 1 tablet (40 mg total) by mouth daily. (Patient not taking: Reported on 05/02/2023), Disp: 90 tablet, Rfl: 1   fish oil-omega-3 fatty acids 1000 MG capsule, Take 1,000 mg by mouth daily., Disp: , Rfl:    levothyroxine (SYNTHROID) 137 MCG tablet, Take 137 mcg by mouth daily before breakfast., Disp: , Rfl:    metoprolol succinate (TOPROL-XL) 25 MG 24 hr tablet,  Take 12.5 mg by mouth daily., Disp: , Rfl:    Multiple Vitamin (MULTI-VITAMIN) tablet, Take 1 tablet by mouth daily., Disp: , Rfl:    Multiple Vitamins-Minerals (CENTRUM SILVER 50+MEN) TABS, Take 1 tablet by mouth daily., Disp: , Rfl:    potassium chloride (KLOR-CON M) 10 MEQ tablet, Take 10 mEq by mouth daily as needed (As needed with Torsemide)., Disp: , Rfl:    predniSONE (DELTASONE) 20 MG tablet, Take 40 mg by mouth as needed (as needed for gout flare ups)., Disp: , Rfl:    rosuvastatin (CRESTOR) 40 MG tablet, Take 1 tablet (40 mg total) by mouth daily., Disp: 90 tablet, Rfl: 1   sacubitril-valsartan (ENTRESTO) 24-26 MG, Take 1 tablet by mouth 2 (two) times daily., Disp: , Rfl:    spironolactone (ALDACTONE) 25 MG tablet, Take 25 mg by mouth daily., Disp: , Rfl:    Testosterone 20.25 MG/ACT (1.62%) GEL, Apply 2 each topically daily. 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 Moderate to severe atypia, edge involved.   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    Tobacco Use: Social History   Tobacco Use  Smoking Status Former   Current packs/day: 0.00   Average packs/day: 0.5 packs/day for 5.0 years (2.5 ttl pk-yrs)   Types: Cigarettes   Start date: 10/02/1965   Quit date: 10/03/1970   Years since quitting: 52.6  Smokeless Tobacco Never  Tobacco Comments   Quit 1972    Labs: Review Flowsheet  More data exists      Latest Ref Rng & Units 06/02/2019 02/25/2020 03/02/2021 11/23/2021 01/17/2022  Labs for ITP Cardiac and Pulmonary Rehab  Cholestrol <200 mg/dL 782  956  213  - 086   LDL (calc) mg/dL (calc) 91  578  88  - 469   HDL-C > OR = 40 mg/dL 48  52  41  - 56   Trlycerides <150 mg/dL 629  528  413  - 244   Hemoglobin A1c 4.8 - 5.6 % 4.6  4.9  - 4.5  -    Details             Exercise Target Goals: Exercise Program Goal: Individual exercise prescription set using results from initial 6 min walk test and THRR while considering  patient's activity barriers and safety.   Exercise Prescription Goal: Initial exercise prescription builds to 30-45 minutes a day of aerobic activity, 2-3 days per week.  Home exercise guidelines will be given to patient during program as part of exercise prescription that the participant will acknowledge.   Education: Aerobic Exercise: - Group verbal and visual presentation on the components of exercise prescription. Introduces F.I.T.T principle from ACSM for exercise prescriptions.  Reviews F.I.T.T. principles of aerobic exercise including progression. Written material given at graduation.   Education: Resistance Exercise: - Group verbal and visual presentation on the components of exercise prescription. Introduces F.I.T.T principle from ACSM for exercise prescriptions  Reviews F.I.T.T. principles of resistance exercise including progression. Written material given at graduation.    Education: Exercise & Equipment Safety: - Individual verbal instruction and demonstration of equipment use  and safety with use of the equipment. Flowsheet Row Cardiac Rehab from 05/08/2023 in [facility] Cardiac and Pulmonary Rehab  Date 05/08/23  Educator NT  Instruction Review Code 1- Verbalizes Understanding       Education: Exercise Physiology & General Exercise Guidelines: - Group verbal and written instruction with models to review the exercise physiology of the cardiovascular system and associated critical values. Provides general exercise guidelines with specific guidelines to those with heart or lung disease.    Education: Flexibility, Balance, Mind/Body Relaxation: - Group verbal and visual presentation with interactive activity on the components of exercise prescription. Introduces F.I.T.T principle from ACSM for exercise  prescriptions. Reviews F.I.T.T. principles of flexibility and balance exercise training including progression. Also discusses the mind body connection.  Reviews various relaxation techniques to help reduce and manage stress (i.e. Deep breathing, progressive muscle relaxation, and visualization). Balance handout provided to take home. Written material given at graduation.   Activity Barriers & Risk Stratification:  Activity Barriers & Cardiac Risk Stratification - 05/08/23 1049       Activity Barriers & Cardiac Risk Stratification   Activity Barriers Joint Problems;Balance Concerns;Arthritis;Back Problems;Other (comment)    Comments gout    Cardiac Risk Stratification Moderate             6 Minute Walk:  6 Minute Walk     Row Name 05/08/23 1047         6 Minute Walk   Phase Initial     Distance 905 feet     Walk Time 6 minutes     # of Rest Breaks 0     MPH 1.71     METS 1.7     RPE 10     Perceived Dyspnea  0     VO2 Peak 5.96     Symptoms No     Resting HR 73 bpm     Resting BP 104/62     Resting Oxygen Saturation  98 %     Exercise Oxygen Saturation  during 6 min walk 100 %     Max Ex. HR 95 bpm     Max Ex. BP 132/70     2 Minute Post BP  110/68              Oxygen Initial Assessment:   Oxygen Re-Evaluation:   Oxygen Discharge (Final Oxygen Re-Evaluation):   Initial Exercise Prescription:  Initial Exercise Prescription - 05/08/23 1100       Date of Initial Exercise RX and Referring Provider   Date 05/08/23    Referring Provider Dr. Dorothyann Peng, MD      Oxygen   Maintain Oxygen Saturation 88% or higher      Treadmill   MPH 1.6    Grade 0    Minutes 15    METs 2.23      Recumbant Bike   Level 1    RPM 50    Watts 15    Minutes 15    METs 1.7      NuStep   Level 2    SPM 80    Minutes 15    METs 1.7      Track   Laps 18    Minutes 15    METs 1.98      Prescription Details   Frequency (times per week) 3    Duration Progress to 30 minutes of continuous aerobic without signs/symptoms of physical distress      Intensity   THRR 40-80% of Max Heartrate 102-132    Ratings of Perceived Exertion 11-13    Perceived Dyspnea 0-4      Progression   Progression Continue to progress workloads to maintain intensity without signs/symptoms of physical distress.      Resistance Training   Training Prescription Yes    Weight 5 lb    Reps 10-15             Perform Capillary Blood Glucose checks as needed.  Exercise Prescription Changes:   Exercise Comments:   Exercise Goals and Review:   Exercise Goals     Row Name 05/08/23 1049  Exercise Goals   Increase Physical Activity Yes       Intervention Provide advice, education, support and counseling about physical activity/exercise needs.;Develop an individualized exercise prescription for aerobic and resistive training based on initial evaluation findings, risk stratification, comorbidities and participant's personal goals.       Expected Outcomes Short Term: Attend rehab on a regular basis to increase amount of physical activity.;Long Term: Exercising regularly at least 3-5 days a week.;Long Term: Add in home exercise  to make exercise part of routine and to increase amount of physical activity.       Increase Strength and Stamina Yes       Intervention Develop an individualized exercise prescription for aerobic and resistive training based on initial evaluation findings, risk stratification, comorbidities and participant's personal goals.;Provide advice, education, support and counseling about physical activity/exercise needs.       Expected Outcomes Short Term: Increase workloads from initial exercise prescription for resistance, speed, and METs.;Short Term: Perform resistance training exercises routinely during rehab and add in resistance training at home;Long Term: Improve cardiorespiratory fitness, muscular endurance and strength as measured by increased METs and functional capacity ( )       Able to understand and use rate of perceived exertion (RPE) scale Yes       Intervention Provide education and explanation on how to use RPE scale       Expected Outcomes Short Term: Able to use RPE daily in rehab to express subjective intensity level;Long Term:  Able to use RPE to guide intensity level when exercising independently       Able to understand and use Dyspnea scale Yes       Intervention Provide education and explanation on how to use Dyspnea scale       Expected Outcomes Long Term: Able to use Dyspnea scale to guide intensity level when exercising independently;Short Term: Able to use Dyspnea scale daily in rehab to express subjective sense of shortness of breath during exertion       Knowledge and understanding of Target Heart Rate Range (THRR) Yes       Intervention Provide education and explanation of THRR including how the numbers were predicted and where they are located for reference       Expected Outcomes Short Term: Able to state/look up THRR;Long Term: Able to use THRR to govern intensity when exercising independently;Short Term: Able to use daily as guideline for intensity in rehab       Able to  check pulse independently Yes       Intervention Provide education and demonstration on how to check pulse in carotid and radial arteries.;Review the importance of being able to check your own pulse for safety during independent exercise       Expected Outcomes Short Term: Able to explain why pulse checking is important during independent exercise;Long Term: Able to check pulse independently and accurately       Understanding of Exercise Prescription Yes       Intervention Provide education, explanation, and written materials on patient's individual exercise prescription       Expected Outcomes Short Term: Able to explain program exercise prescription;Long Term: Able to explain home exercise prescription to exercise independently                Exercise Goals Re-Evaluation :   Discharge Exercise Prescription (Final Exercise Prescription Changes):   Nutrition:  Target Goals: Understanding of nutrition guidelines, daily intake of sodium 1500mg , cholesterol 200mg , calories 30% from  fat and 7% or less from saturated fats, daily to have 5 or more servings of fruits and vegetables.  Education: All About Nutrition: -Group instruction provided by verbal, written material, interactive activities, discussions, models, and posters to present general guidelines for heart healthy nutrition including fat, fiber, MyPlate, the role of sodium in heart healthy nutrition, utilization of the nutrition label, and utilization of this knowledge for meal planning. Follow up email sent as well. Written material given at graduation. Flowsheet Row Cardiac Rehab from 05/08/2023 in [facility] Cardiac and Pulmonary Rehab  Education need identified 05/08/23       Biometrics:  Pre Biometrics - 05/08/23 1048       Pre Biometrics   Height 5\' 11"  (1.803 m)    Weight 237 lb 4.8 oz (107.6 kg)    Waist Circumference 47 inches    Hip Circumference 44.5 inches    Waist to Hip Ratio 1.06 %    BMI (Calculated) 33.11     Single Leg Stand 15.7 seconds              Nutrition Therapy Plan and Nutrition Goals:  Nutrition Therapy & Goals - 05/08/23 1143       Intervention Plan   Intervention Prescribe, educate and counsel regarding individualized specific dietary modifications aiming towards targeted core components such as weight, hypertension, lipid management, diabetes, heart failure and other comorbidities.    Expected Outcomes Short Term Goal: Understand basic principles of dietary content, such as calories, fat, sodium, cholesterol and nutrients.;Short Term Goal: A plan has been developed with personal nutrition goals set during dietitian appointment.;Long Term Goal: Adherence to prescribed nutrition plan.             Nutrition Assessments:  MEDIFICTS Score Key: >=70 Need to make dietary changes  40-70 Heart Healthy Diet <= 40 Therapeutic Level Cholesterol Diet  Flowsheet Row Cardiac Rehab from 05/08/2023 in [facility] Cardiac and Pulmonary Rehab  Picture Your Plate Total Score on Admission 71      Picture Your Plate Scores: <16 Unhealthy dietary pattern with much room for improvement. 41-50 Dietary pattern unlikely to meet recommendations for good health and room for improvement. 51-60 More healthful dietary pattern, with some room for improvement.  >60 Healthy dietary pattern, although there may be some specific behaviors that could be improved.    Nutrition Goals Re-Evaluation:   Nutrition Goals Discharge (Final Nutrition Goals Re-Evaluation):   Psychosocial: Target Goals: Acknowledge presence or absence of significant depression and/or stress, maximize coping skills, provide positive support system. Participant is able to verbalize types and ability to use techniques and skills needed for reducing stress and depression.   Education: Stress, Anxiety, and Depression - Group verbal and visual presentation to define topics covered.  Reviews how body is impacted by stress, anxiety, and  depression.  Also discusses healthy ways to reduce stress and to treat/manage anxiety and depression.  Written material given at graduation.   Education: Sleep Hygiene -Provides group verbal and written instruction about how sleep can affect your health.  Define sleep hygiene, discuss sleep cycles and impact of sleep habits. Review good sleep hygiene tips.    Initial Review & Psychosocial Screening:  Initial Psych Review & Screening - 05/02/23 1342       Initial Review   Current issues with Current Stress Concerns    Source of Stress Concerns --    Comments Health issues this last year      Family Dynamics   Good Support System? Yes  wife     Barriers   Psychosocial barriers to participate in program There are no identifiable barriers or psychosocial needs.;The patient should benefit from training in stress management and relaxation.      Screening Interventions   Interventions Encouraged to exercise;Provide feedback about the scores to participant;To provide support and resources with identified psychosocial needs    Expected Outcomes Short Term goal: Utilizing psychosocial counselor, staff and physician to assist with identification of specific Stressors or current issues interfering with healing process. Setting desired goal for each stressor or current issue identified.;Long Term Goal: Stressors or current issues are controlled or eliminated.;Short Term goal: Identification and review with participant of any Quality of Life or Depression concerns found by scoring the questionnaire.;Long Term goal: The participant improves quality of Life and PHQ9 Scores as seen by post scores and/or verbalization of changes             Quality of Life Scores:   Quality of Life - 05/08/23 1147       Quality of Life   Select Quality of Life      Quality of Life Scores   Health/Function Pre 23.47 %    Socioeconomic Pre 23 %    Psych/Spiritual Pre 23.57 %    Family Pre 22.5 %    GLOBAL  Pre 23.27 %            Scores of 19 and below usually indicate a poorer quality of life in these areas.  A difference of  2-3 points is a clinically meaningful difference.  A difference of 2-3 points in the total score of the Quality of Life Index has been associated with significant improvement in overall quality of life, self-image, physical symptoms, and general health in studies assessing change in quality of life.  PHQ-9: Review Flowsheet  More data exists      05/08/2023 06/19/2022 06/14/2022 06/04/2022 01/17/2022  Depression screen PHQ 2/9  Decreased Interest 0 0 0 0 0  Down, Depressed, Hopeless 0 0 0 0 0  PHQ - 2 Score 0 0 0 0 0  Altered sleeping 0 0 0 0 0  Tired, decreased energy 1 0 0 0 0  Change in appetite 0 0 0 0 0  Feeling bad or failure about yourself  0 0 0 0 0  Trouble concentrating 0 0 0 0 0  Moving slowly or fidgety/restless 0 0 0 0 0  Suicidal thoughts 0 0 0 0 0  PHQ-9 Score 1 0 0 0 0  Difficult doing work/chores Not difficult at all Not difficult at all - Not difficult at all -    Details           Interpretation of Total Score  Total Score Depression Severity:  1-4 = Minimal depression, 5-9 = Mild depression, 10-14 = Moderate depression, 15-19 = Moderately severe depression, 20-27 = Severe depression   Psychosocial Evaluation and Intervention:  Psychosocial Evaluation - 05/02/23 1402       Psychosocial Evaluation & Interventions   Interventions Encouraged to exercise with the program and follow exercise prescription;Stress management education;Relaxation education    Comments Mr. Liford is coming to cardiac rehab after a TAVR. He states he is feeling well and has started walking more. 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 He states this last year has been filled with health problems with him and prior to  last December he did not have much of a heart  history. He states he takes it day by day and just pushes forward. His wife is his main support system. He is interested in the education classes and learning more of how to independently manage his health.    Expected Outcomes Short: attend cardiac rehab for education and exercise. Long; develop and maintain positive self care habits    Continue Psychosocial Services  Follow up required by staff             Psychosocial Re-Evaluation:   Psychosocial Discharge (Final Psychosocial Re-Evaluation):   Vocational Rehabilitation: Provide vocational rehab assistance to qualifying candidates.   Vocational Rehab Evaluation & Intervention:  Vocational Rehab - 05/02/23 1339       Initial Vocational Rehab Evaluation & Intervention   Assessment shows need for Vocational Rehabilitation No             Education: Education Goals: Education classes will be provided on a variety of topics geared toward better understanding of heart health and risk factor modification. Participant will state understanding/return demonstration of topics presented as noted by education test scores.  Learning Barriers/Preferences:  Learning Barriers/Preferences - 05/02/23 1338       Learning Barriers/Preferences   Learning Barriers None    Learning Preferences None             General Cardiac Education Topics:  AED/CPR: - Group verbal and written instruction with the use of models to demonstrate the basic use of the AED with the basic ABC's of resuscitation.   Anatomy and Cardiac Procedures: - Group verbal and visual presentation and models provide information about basic cardiac anatomy and function. Reviews the testing methods done to diagnose heart disease and the outcomes of the test results. Describes the treatment choices: Medical Management, Angioplasty, or Coronary Bypass Surgery for treating various heart conditions including Myocardial Infarction, Angina, Valve Disease, and Cardiac  Arrhythmias.  Written material given at graduation.   Medication Safety: - Group verbal and visual instruction to review commonly prescribed medications for heart and lung disease. Reviews the medication, class of the drug, and side effects. Includes the steps to properly store meds and maintain the prescription regimen.  Written material given at graduation.   Intimacy: - Group verbal instruction through game format to discuss how heart and lung disease can affect sexual intimacy. Written material given at graduation..   Know Your Numbers and Heart Failure: - Group verbal and visual instruction to discuss disease risk factors for cardiac and pulmonary disease and treatment options.  Reviews associated critical values for Overweight/Obesity, Hypertension, Cholesterol, and Diabetes.  Discusses basics of heart failure: signs/symptoms and treatments.  Introduces Heart Failure Zone chart for action plan for heart failure.  Written material given at graduation.   Infection Prevention: - Provides verbal and written material to individual with discussion of infection control including proper hand washing and proper equipment cleaning during exercise session. Flowsheet Row Cardiac Rehab from 05/08/2023 in [facility] Cardiac and Pulmonary Rehab  Date 05/08/23  Educator NT  Instruction Review Code 1- Verbalizes Understanding       Falls Prevention: - Provides verbal and written material to individual with discussion of falls prevention and safety. Flowsheet Row Cardiac Rehab from 05/08/2023 in [facility] Cardiac and Pulmonary Rehab  Date 05/08/23  Educator NT  Instruction Review Code 1- Verbalizes Understanding       Other: -Provides group and verbal instruction on various topics (see comments)   Knowledge Questionnaire Score:  Knowledge Questionnaire Score - 05/08/23 1144       Knowledge Questionnaire Score   Pre Score 25/26             Core Components/Risk Factors/Patient Goals at  Admission:  Personal Goals and Risk Factors at Admission - 05/02/23 1338       Core Components/Risk Factors/Patient Goals on Admission   Hypertension Yes    Intervention Provide education on lifestyle modifcations including regular physical activity/exercise, weight management, moderate sodium restriction and increased consumption of fresh fruit, vegetables, and low fat dairy, alcohol moderation, and smoking cessation.;Monitor prescription use compliance.    Expected Outcomes Short Term: Continued assessment and intervention until BP is < 140/42mm HG in hypertensive participants. < 130/50mm HG in hypertensive participants with diabetes, heart failure or chronic kidney disease.;Long Term: Maintenance of blood pressure at goal levels.    Lipids Yes    Intervention Provide education and support for participant on nutrition & aerobic/resistive exercise along with prescribed medications to achieve LDL 70mg , HDL >40mg .    Expected Outcomes Short Term: Participant states understanding of desired cholesterol values and is compliant with medications prescribed. Participant is following exercise prescription and nutrition guidelines.;Long Term: Cholesterol controlled with medications as prescribed, with individualized exercise RX and with personalized nutrition plan. Value goals: LDL < 70mg , HDL > 40 mg.             Education:Diabetes - Individual verbal and written instruction to review signs/symptoms of diabetes, desired ranges of glucose level fasting, after meals and with exercise. Acknowledge that pre and post exercise glucose checks will be done for 3 sessions at entry of program.   Core Components/Risk Factors/Patient Goals Review:    Core Components/Risk Factors/Patient Goals at Discharge (Final Review):    ITP Comments:  ITP Comments     Row Name 05/02/23 1347 05/08/23 1047         ITP Comments Initial phone call completed. Diagnosis can be found in [facility] 10/15. EP Orientation  scheduled for Wednesday 11/20 at 9:30. Completed and gym orientation. Initial ITP created and sent for review to Dr. Bethann Punches, Medical Director.               Comments: Initial ITP

## 2023-05-20 ENCOUNTER — Ambulatory Visit (HOSPITAL_COMMUNITY): Payer: Medicare Other

## 2023-05-24 ENCOUNTER — Ambulatory Visit (HOSPITAL_COMMUNITY)
Admission: RE | Admit: 2023-05-24 | Discharge: 2023-05-24 | Disposition: A | Discharge status: Home / Self Care | Payer: Medicare Other | Source: Ambulatory Visit | Attending: Interventional Radiology | Admitting: Interventional Radiology

## 2023-05-24 ENCOUNTER — Other Ambulatory Visit (HOSPITAL_COMMUNITY): Payer: Self-pay | Admitting: Student

## 2023-05-24 ENCOUNTER — Other Ambulatory Visit (HOSPITAL_COMMUNITY): Payer: Self-pay | Admitting: Radiology

## 2023-05-24 ENCOUNTER — Ambulatory Visit (HOSPITAL_COMMUNITY)
Admission: RE | Admit: 2023-05-24 | Discharge: 2023-05-24 | Disposition: A | Discharge status: Home / Self Care | Payer: Medicare Other | Source: Ambulatory Visit | Attending: Student | Admitting: Student

## 2023-05-24 DIAGNOSIS — Z5309 Procedure and treatment not carried out because of other contraindication: Secondary | ICD-10-CM

## 2023-05-24 DIAGNOSIS — N2889 Other specified disorders of kidney and ureter: Secondary | ICD-10-CM | POA: Diagnosis present

## 2023-05-24 MED ORDER — GADOBUTROL 1 MMOL/ML IV SOLN: 10.0000 mL | Freq: Once | INTRAVENOUS | Status: DC | PRN

## 2023-05-24 MED ORDER — GADOBUTROL 1 MMOL/ML IV SOLN
10.0000 mL | Freq: Once | INTRAVENOUS | Status: AC | PRN
  Administered 2023-05-24: 10 mL via INTRAVENOUS

## 2023-05-24 NOTE — Progress Notes (Signed)
Pt has 2 medtronic pacemakers.  Ok'd by cardiology PA and with [employer] rep.  Rep was present and put pt in surescan mode with their programmer. Rn monitored pt during exam

## 2023-05-27 ENCOUNTER — Encounter: Payer: Medicare Other | Attending: Internal Medicine | Admitting: *Deleted

## 2023-05-27 DIAGNOSIS — Z952 Presence of prosthetic heart valve: Secondary | ICD-10-CM

## 2023-05-27 NOTE — Progress Notes (Signed)
Assessment start time: 8:14 AM  Digestive issues/concerns: has gout, no pork or shellfish. Reports it has been working  24-hours Recall: B: english muffin, peanut butter, coffee L: chili, two cookies D: grilled cheese sandwich, cauliflower Snack: yogurt  Beverages coffee, glass of % milk, aims for 64oz per day Alcohol 6-8 drinks a week Caffeine coffee  Supplements MVI Intake Patterns often misses breakfast, usually eats lunch. Doesn't eat fast food often. Dinner often made at home.    Education r/t nutrition plan Patient not always drinking enough water, his kidney Dr has recommended he get ~64oz. Agreed with Dr and recommended ~64oz of water daily. Spoke about sodium intake and goal of less than 1500mg  daily. Gave some ideas to cut back on sodium. He rarely eats breakfast, spoke with him about the importance of eating something small and nutrient dense in the morning. Reviewed Mediterranean diet handout. Educated on types of fats, sources, and how to read labels. Built out several meals and snacks with foods he likes and will eat.     Goal 1: Eat 15-30gProtein and 30-60gCarbs at each meal. Goal 2: Reduce saturated fat, less than 12g per day. Replace bad fats for more heart healthy fats.  Goal 3: Read labels and reduce sodium intake to below 2300mg . Ideally 1500mg  per day.   End time 9:07 AM

## 2023-05-27 NOTE — Progress Notes (Signed)
Daily Session Note  Patient Details  Name: Alan Campbell [name]: [id number] Date of Birth: Aug 24, 1950 Referring Provider:   Flowsheet Row Cardiac Rehab from 05/08/2023 in [facility] Cardiac and Pulmonary Rehab  Referring Provider Dr. Dorothyann Peng, MD       Encounter Date: 05/27/2023  Check In:  Session Check In - 05/27/23 0927       Check-In   Supervising physician immediately available to respond to emergencies See telemetry face sheet for immediately available ER MD    Location [facility]-Cardiac & Pulmonary Rehab    Staff Present Cora Collum, RN, BSN, CCRP;Margaret Best, MS, Exercise Physiologist;Maxon Conetta BS, , Exercise Physiologist;Wilfrid Hyser Katrinka Blazing, RN, ADN    Virtual Visit No    Medication changes reported     No    Fall or balance concerns reported    No    Warm-up and Cool-down Performed on first and last piece of equipment    Resistance Training Performed Yes    VAD Patient? No    PAD/SET Patient? No      Pain Assessment   Currently in Pain? No/denies                Social History   Tobacco Use  Smoking Status Former   Current packs/day: 0.00   Average packs/day: 0.5 packs/day for 5.0 years (2.5 ttl pk-yrs)   Types: Cigarettes   Start date: 10/02/1965   Quit date: 10/03/1970   Years since quitting: 52.6  Smokeless Tobacco Never  Tobacco Comments   Quit 1972    Goals Met:  Independence with exercise equipment Exercise tolerated well No report of concerns or symptoms today Strength training completed today  Goals Unmet:  Not Applicable  Comments: First full day of exercise!  Patient was oriented to gym and equipment including functions, settings, policies, and procedures.  Patient's individual exercise prescription and treatment plan were reviewed.  All starting workloads were established based on the results of the 6 minute walk test done at initial orientation visit.  The plan for exercise progression was also introduced and progression will be customized  based on patient's performance and goals.    Dr. Bethann Punches is Medical Director for [facility] Cardiac Rehabilitation.  Dr. Vida Rigger is Medical Director for [facility] Pulmonary Rehabilitation.

## 2023-05-29 ENCOUNTER — Encounter: Payer: Medicare Other | Admitting: *Deleted

## 2023-05-29 DIAGNOSIS — Z952 Presence of prosthetic heart valve: Secondary | ICD-10-CM | POA: Diagnosis not present

## 2023-05-29 NOTE — Progress Notes (Signed)
Daily Session Note  Patient Details  Name: Alan Campbell [name]: [id number] Date of Birth: Nov 14, 1950 Referring Provider:   Flowsheet Row Cardiac Rehab from 05/08/2023 in [facility] Cardiac and Pulmonary Rehab  Referring Provider Dr. Dorothyann Peng, MD       Encounter Date: 05/29/2023  Check In:  Session Check In - 05/29/23 1557       Check-In   Supervising physician immediately available to respond to emergencies See telemetry face sheet for immediately available ER MD    Location [facility]-Cardiac & Pulmonary Rehab    Staff Present Susann Givens, RN BSN;Joseph [place], [place];Cora Collum, RN, BSN, CCRP;Megan Katrinka Blazing, RN, ADN    Virtual Visit No    Medication changes reported     No    Fall or balance concerns reported    No    Warm-up and Cool-down Performed on first and last piece of equipment    Resistance Training Performed Yes    VAD Patient? No    PAD/SET Patient? No      Pain Assessment   Currently in Pain? No/denies                Social History   Tobacco Use  Smoking Status Former   Current packs/day: 0.00   Average packs/day: 0.5 packs/day for 5.0 years (2.5 ttl pk-yrs)   Types: Cigarettes   Start date: 10/02/1965   Quit date: 10/03/1970   Years since quitting: 52.6  Smokeless Tobacco Never  Tobacco Comments   Quit 1972    Goals Met:  Independence with exercise equipment Exercise tolerated well No report of concerns or symptoms today Strength training completed today  Goals Unmet:  Not Applicable  Comments: Pt able to follow exercise prescription today without complaint.  Will continue to monitor for progression.    Dr. Bethann Punches is Medical Director for [facility] Cardiac Rehabilitation.  Dr. Vida Rigger is Medical Director for [facility] Pulmonary Rehabilitation.

## 2023-05-31 ENCOUNTER — Encounter: Payer: Medicare Other | Admitting: *Deleted

## 2023-05-31 DIAGNOSIS — Z952 Presence of prosthetic heart valve: Secondary | ICD-10-CM

## 2023-05-31 NOTE — Progress Notes (Signed)
Daily Session Note  Patient Details  Name: ARBER DOOLING [name]: [id number] Date of Birth: 04-18-1951 Referring Provider:   Flowsheet Row Cardiac Rehab from 05/08/2023 in [facility] Cardiac and Pulmonary Rehab  Referring Provider Dr. Dorothyann Peng, MD       Encounter Date: 05/31/2023  Check In:  Session Check In - 05/31/23 0937       Check-In   Supervising physician immediately available to respond to emergencies See telemetry face sheet for immediately available ER MD    Location [facility]-Cardiac & Pulmonary Rehab    Staff Present Ronette Deter, BS, Exercise Physiologist;Joseph Shelbie Proctor, RN, [place]    Virtual Visit No    Medication changes reported     No    Fall or balance concerns reported    No    Warm-up and Cool-down Performed on first and last piece of equipment    Resistance Training Performed Yes    VAD Patient? No    PAD/SET Patient? No      Pain Assessment   Currently in Pain? No/denies                Social History   Tobacco Use  Smoking Status Former   Current packs/day: 0.00   Average packs/day: 0.5 packs/day for 5.0 years (2.5 ttl pk-yrs)   Types: Cigarettes   Start date: 10/02/1965   Quit date: 10/03/1970   Years since quitting: 52.6  Smokeless Tobacco Never  Tobacco Comments   Quit 1972    Goals Met:  Independence with exercise equipment Exercise tolerated well No report of concerns or symptoms today Strength training completed today  Goals Unmet:  Not Applicable  Comments: Pt able to follow exercise prescription today without complaint.  Will continue to monitor for progression.    Dr. Bethann Punches is Medical Director for [facility] Cardiac Rehabilitation.  Dr. Vida Rigger is Medical Director for [facility] Pulmonary Rehabilitation.

## 2023-06-03 ENCOUNTER — Encounter: Payer: Medicare Other | Admitting: *Deleted

## 2023-06-03 DIAGNOSIS — Z952 Presence of prosthetic heart valve: Secondary | ICD-10-CM | POA: Diagnosis not present

## 2023-06-03 NOTE — Progress Notes (Signed)
Daily Session Note  Patient Details  Name: Alan Campbell [name]: [id number] Date of Birth: 23-May-1951 Referring Provider:   Flowsheet Row Cardiac Rehab from 05/08/2023 in [facility] Cardiac and Pulmonary Rehab  Referring Provider Dr. Dorothyann Peng, MD       Encounter Date: 06/03/2023  Check In:  Session Check In - 06/03/23 0950       Check-In   Supervising physician immediately available to respond to emergencies See telemetry face sheet for immediately available ER MD    Location [facility]-Cardiac & Pulmonary Rehab    Staff Present Rory Percy, MS, Exercise Physiologist;Maxon Suzzette Righter, , Exercise Physiologist;Kelly Madilyn Fireman, BS, ACSM CEP, Exercise Physiologist;Sahas Sluka Katrinka Blazing, RN, ADN    Virtual Visit No    Medication changes reported     No    Fall or balance concerns reported    No    Warm-up and Cool-down Performed on first and last piece of equipment    Resistance Training Performed Yes    VAD Patient? No    PAD/SET Patient? No      Pain Assessment   Currently in Pain? No/denies                Social History   Tobacco Use  Smoking Status Former   Current packs/day: 0.00   Average packs/day: 0.5 packs/day for 5.0 years (2.5 ttl pk-yrs)   Types: Cigarettes   Start date: 10/02/1965   Quit date: 10/03/1970   Years since quitting: 52.7  Smokeless Tobacco Never  Tobacco Comments   Quit 1972    Goals Met:  Independence with exercise equipment Exercise tolerated well No report of concerns or symptoms today Strength training completed today  Goals Unmet:  Not Applicable  Comments: Pt able to follow exercise prescription today without complaint.  Will continue to monitor for progression.    Dr. Bethann Punches is Medical Director for [facility] Cardiac Rehabilitation.  Dr. Vida Rigger is Medical Director for [facility] Pulmonary Rehabilitation.

## 2023-06-05 ENCOUNTER — Encounter: Payer: Medicare Other | Admitting: *Deleted

## 2023-06-05 ENCOUNTER — Encounter: Payer: Self-pay | Admitting: *Deleted

## 2023-06-05 DIAGNOSIS — Z952 Presence of prosthetic heart valve: Secondary | ICD-10-CM

## 2023-06-05 NOTE — Progress Notes (Signed)
Cardiac Individual Treatment Plan  Patient Details  Name: Alan Campbell [name]: [id number] Date of Birth: 1951/03/22 Referring Provider:   Flowsheet Row Cardiac Rehab from 05/08/2023 in [facility] Cardiac and Pulmonary Rehab  Referring Provider Dr. Dorothyann Peng, MD       Initial Encounter Date:  Flowsheet Row Cardiac Rehab from 05/08/2023 in [facility] Cardiac and Pulmonary Rehab  Date 05/08/23       Visit Diagnosis: S/P TAVR (transcatheter aortic valve replacement)  Patient's Home Medications on Admission:  Current Outpatient Medications:    Acetaminophen 500 MG capsule, Take by mouth., Disp: , Rfl:    albuterol (VENTOLIN HFA) 108 (90 Base) MCG/ACT inhaler, Inhale 2 puffs into the lungs every 6 (six) hours as needed for wheezing or shortness of breath., Disp: 6.7 g, Rfl: 0   allopurinol (ZYLOPRIM) 100 MG tablet, Take 50 mg by mouth daily., Disp: , Rfl:    amitriptyline (ELAVIL) 25 MG tablet, Take 1 tablet by mouth at bedtime. (Patient not taking: Reported on 05/02/2023), Disp: , Rfl:    aspirin EC 81 MG tablet, Take 81 mg by mouth daily. Swallow whole., Disp: , Rfl:    Cholecalciferol (VITAMIN D) 2000 units CAPS, Take 1 capsule (2,000 Units total) by mouth daily., Disp: 30 capsule, Rfl: 0   Cinnamon 500 MG TABS, Take 1,000 mg by mouth daily., Disp: , Rfl:    clonazePAM (KLONOPIN) 1 MG tablet, Take 1 mg by mouth at bedtime as needed., Disp: , Rfl:    CRANBERRY PO, Take 4,200 mg by mouth daily., Disp: , Rfl:    empagliflozin (JARDIANCE) 10 MG TABS tablet, Take 10 mg by mouth daily., Disp: , Rfl:    febuxostat (ULORIC) 40 MG tablet, Take 1 tablet (40 mg total) by mouth daily. (Patient not taking: Reported on 05/02/2023), Disp: 90 tablet, Rfl: 1   fish oil-omega-3 fatty acids 1000 MG capsule, Take 1,000 mg by mouth daily., Disp: , Rfl:    levothyroxine (SYNTHROID) 137 MCG tablet, Take 137 mcg by mouth daily before breakfast., Disp: , Rfl:    metoprolol succinate (TOPROL-XL) 25 MG 24 hr tablet,  Take 12.5 mg by mouth daily., Disp: , Rfl:    Multiple Vitamin (MULTI-VITAMIN) tablet, Take 1 tablet by mouth daily., Disp: , Rfl:    Multiple Vitamins-Minerals (CENTRUM SILVER 50+MEN) TABS, Take 1 tablet by mouth daily., Disp: , Rfl:    potassium chloride (KLOR-CON M) 10 MEQ tablet, Take 10 mEq by mouth daily as needed (As needed with Torsemide)., Disp: , Rfl:    predniSONE (DELTASONE) 20 MG tablet, Take 40 mg by mouth as needed (as needed for gout flare ups)., Disp: , Rfl:    rosuvastatin (CRESTOR) 40 MG tablet, Take 1 tablet (40 mg total) by mouth daily., Disp: 90 tablet, Rfl: 1   sacubitril-valsartan (ENTRESTO) 24-26 MG, Take 1 tablet by mouth 2 (two) times daily., Disp: , Rfl:    spironolactone (ALDACTONE) 25 MG tablet, Take 25 mg by mouth daily., Disp: , Rfl:    Testosterone 20.25 MG/ACT (1.62%) GEL, Apply 2 each topically daily. 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 Moderate to severe atypia, edge involved.   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    Tobacco Use: Social History   Tobacco Use  Smoking Status Former   Current packs/day: 0.00   Average packs/day: 0.5 packs/day for 5.0 years (2.5 ttl pk-yrs)   Types: Cigarettes   Start date: 10/02/1965   Quit date: 10/03/1970   Years since quitting: 52.7  Smokeless Tobacco Never  Tobacco Comments   Quit 1972    Labs: Review Flowsheet  More data exists      Latest Ref Rng & Units 06/02/2019 02/25/2020 03/02/2021 11/23/2021 01/17/2022  Labs for ITP Cardiac and Pulmonary Rehab  Cholestrol <200 mg/dL 102  725  366  - 440   LDL (calc) mg/dL (calc) 91  347  88  - 425   HDL-C > OR = 40 mg/dL 48  52  41  - 56   Trlycerides <150 mg/dL 956  387  564  - 332   Hemoglobin A1c 4.8 - 5.6 % 4.6  4.9  - 4.5  -     Exercise Target Goals: Exercise Program Goal: Individual exercise prescription set using results from initial 6 min walk test and THRR while considering  patient's activity barriers and safety.   Exercise Prescription Goal: Initial exercise prescription builds to 30-45 minutes a day of aerobic activity, 2-3 days per week.  Home exercise guidelines will be given to patient during program as part of exercise prescription that the participant will acknowledge.   Education: Aerobic Exercise: - Group verbal and visual presentation on the components of exercise prescription. Introduces F.I.T.T principle from ACSM for exercise prescriptions.  Reviews F.I.T.T. principles of aerobic exercise including progression. Written material given at graduation.   Education: Resistance Exercise: - Group verbal and visual presentation on the components of exercise prescription. Introduces F.I.T.T principle from ACSM for exercise prescriptions  Reviews F.I.T.T. principles of resistance exercise including progression. Written material given at graduation.    Education: Exercise & Equipment Safety: - Individual verbal instruction and demonstration of equipment use and safety with use of  the equipment. Flowsheet Row Cardiac Rehab from 05/08/2023 in [facility] Cardiac and Pulmonary Rehab  Date 05/08/23  Educator NT  Instruction Review Code 1- Verbalizes Understanding       Education: Exercise Physiology & General Exercise Guidelines: - Group verbal and written instruction with models to review the exercise physiology of the cardiovascular system and associated critical values. Provides general exercise guidelines with specific guidelines to those with heart or lung disease.    Education: Flexibility, Balance, Mind/Body Relaxation: - Group verbal and visual presentation with interactive activity on the components of exercise prescription. Introduces F.I.T.T principle from ACSM for exercise prescriptions. Reviews F.I.T.T. principles of flexibility and balance exercise training including progression.  Also discusses the mind body connection.  Reviews various relaxation techniques to help reduce and manage stress (i.e. Deep breathing, progressive muscle relaxation, and visualization). Balance handout provided to take home. Written material given at graduation.   Activity Barriers & Risk Stratification:  Activity Barriers & Cardiac Risk Stratification - 05/08/23 1049       Activity Barriers & Cardiac Risk Stratification   Activity Barriers Joint Problems;Balance Concerns;Arthritis;Back Problems;Other (comment)    Comments gout    Cardiac Risk Stratification Moderate             6 Minute Walk:  6 Minute Walk     Row Name 05/08/23 1047         6 Minute Walk   Phase Initial     Distance 905 feet     Walk Time 6 minutes     # of Rest Breaks 0     MPH 1.71     METS 1.7     RPE 10     Perceived Dyspnea  0     VO2 Peak 5.96     Symptoms No     Resting HR 73 bpm     Resting BP 104/62     Resting Oxygen Saturation  98 %     Exercise Oxygen Saturation  during 6 min walk 100 %     Max Ex. HR 95 bpm     Max Ex. BP 132/70     2 Minute Post BP 110/68               Oxygen Initial Assessment:   Oxygen Re-Evaluation:   Oxygen Discharge (Final Oxygen Re-Evaluation):   Initial Exercise Prescription:  Initial Exercise Prescription - 05/08/23 1100       Date of Initial Exercise RX and Referring Provider   Date 05/08/23    Referring Provider Dr. Dorothyann Peng, MD      Oxygen   Maintain Oxygen Saturation 88% or higher      Treadmill   MPH 1.6    Grade 0    Minutes 15    METs 2.23      Recumbant Bike   Level 1    RPM 50    Watts 15    Minutes 15    METs 1.7      NuStep   Level 2    SPM 80    Minutes 15    METs 1.7      Track   Laps 18    Minutes 15    METs 1.98      Prescription Details   Frequency (times per week) 3    Duration Progress to 30 minutes of continuous aerobic without signs/symptoms of physical distress      Intensity   THRR 40-80% of Max Heartrate 102-132    Ratings of Perceived Exertion 11-13    Perceived Dyspnea 0-4      Progression   Progression Continue to progress workloads to maintain intensity without signs/symptoms of physical distress.      Resistance Training   Training Prescription Yes    Weight 5 lb    Reps 10-15             Perform Capillary Blood Glucose checks as needed.  Exercise Prescription Changes:   Exercise Comments:   Exercise Comments     Row Name 05/27/23 0929           Exercise Comments First full day of exercise!  Patient was oriented  to gym and equipment including functions, settings, policies, and procedures.  Patient's individual exercise prescription and treatment plan were reviewed.  All starting workloads were established based on the results of the 6 minute walk test done at initial orientation visit.  The plan for exercise progression was also introduced and progression will be customized based on patient's performance and goals.                Exercise Goals and Review:   Exercise Goals     Row Name 05/08/23 1049              Exercise Goals   Increase Physical Activity Yes       Intervention Provide advice, education, support and counseling about physical activity/exercise needs.;Develop an individualized exercise prescription for aerobic and resistive training based on initial evaluation findings, risk stratification, comorbidities and participant's personal goals.       Expected Outcomes Short Term: Attend rehab on a regular basis to increase amount of physical activity.;Long Term: Exercising regularly at least 3-5 days a week.;Long Term: Add in home exercise to make exercise part of routine and to increase amount of physical activity.       Increase Strength and Stamina Yes       Intervention Develop an individualized exercise prescription for aerobic and resistive training based on initial evaluation findings, risk stratification, comorbidities and participant's personal goals.;Provide advice, education, support and counseling about physical activity/exercise needs.       Expected Outcomes Short Term: Increase workloads from initial exercise prescription for resistance, speed, and METs.;Short Term: Perform resistance training exercises routinely during rehab and add in resistance training at home;Long Term: Improve cardiorespiratory fitness, muscular endurance and strength as measured by increased METs and functional capacity ( )       Able to understand and use rate of perceived exertion (RPE) scale Yes       Intervention Provide education and explanation on how to use RPE scale       Expected Outcomes Short Term: Able to use RPE daily in rehab to express subjective intensity level;Long Term:  Able to use RPE to guide intensity level when exercising independently       Able to understand and use Dyspnea scale Yes       Intervention Provide education and explanation on how to use Dyspnea scale       Expected Outcomes Long Term: Able to use Dyspnea scale to guide intensity level when exercising independently;Short Term:  Able to use Dyspnea scale daily in rehab to express subjective sense of shortness of breath during exertion       Knowledge and understanding of Target Heart Rate Range (THRR) Yes       Intervention Provide education and explanation of THRR including how the numbers were predicted and where they are located for reference       Expected Outcomes Short Term: Able to state/look up THRR;Long Term: Able to use THRR to govern intensity when exercising independently;Short Term: Able to use daily as guideline for intensity in rehab       Able to check pulse independently Yes       Intervention Provide education and demonstration on how to check pulse in carotid and radial arteries.;Review the importance of being able to check your own pulse for safety during independent exercise       Expected Outcomes Short Term: Able to explain why pulse checking is important during independent exercise;Long Term: Able to check pulse independently and  accurately       Understanding of Exercise Prescription Yes       Intervention Provide education, explanation, and written materials on patient's individual exercise prescription       Expected Outcomes Short Term: Able to explain program exercise prescription;Long Term: Able to explain home exercise prescription to exercise independently                Exercise Goals Re-Evaluation :  Exercise Goals Re-Evaluation     Row Name 05/27/23 0929             Exercise Goal Re-Evaluation   Exercise Goals Review Able to understand and use rate of perceived exertion (RPE) scale;Able to understand and use Dyspnea scale;Knowledge and understanding of Target Heart Rate Range (THRR);Understanding of Exercise Prescription       Comments Reviewed RPE and dyspnea scale, THR and program prescription with pt today.  Pt voiced understanding and was given a copy of goals to take home.       Expected Outcomes Short: Use RPE daily to regulate intensity. Long: Follow program prescription  in THR.                Discharge Exercise Prescription (Final Exercise Prescription Changes):   Nutrition:  Target Goals: Understanding of nutrition guidelines, daily intake of sodium 1500mg , cholesterol 200mg , calories 30% from fat and 7% or less from saturated fats, daily to have 5 or more servings of fruits and vegetables.  Education: All About Nutrition: -Group instruction provided by verbal, written material, interactive activities, discussions, models, and posters to present general guidelines for heart healthy nutrition including fat, fiber, MyPlate, the role of sodium in heart healthy nutrition, utilization of the nutrition label, and utilization of this knowledge for meal planning. Follow up email sent as well. Written material given at graduation. Flowsheet Row Cardiac Rehab from 05/08/2023 in [facility] Cardiac and Pulmonary Rehab  Education need identified 05/08/23       Biometrics:  Pre Biometrics - 05/08/23 1048       Pre Biometrics   Height 5\' 11"  (1.803 m)    Weight 237 lb 4.8 oz (107.6 kg)    Waist Circumference 47 inches    Hip Circumference 44.5 inches    Waist to Hip Ratio 1.06 %    BMI (Calculated) 33.11    Single Leg Stand 15.7 seconds              Nutrition Therapy Plan and Nutrition Goals:  Nutrition Therapy & Goals - 05/27/23 1356       Nutrition Therapy   Diet Cardiac, Low Na    Protein (specify units) 90    Fiber 30 grams    Whole Grain Foods 3 servings    Saturated Fats 15 max. grams    Fruits and Vegetables 5 servings/day    Sodium 2 grams      Personal Nutrition Goals   Nutrition Goal Eat 15-30gProtein and 30-60gCarbs at each meal.    Personal Goal #2 Reduce saturated fat, less than 12g per day. Replace bad fats for more heart healthy fats.    Personal Goal #3 Read labels and reduce sodium intake to below 2300mg . Ideally 1500mg  per day.    Comments Patient not always drinking enough water, his kidney Dr has recommended he get ~64oz.  Agreed with Dr and recommended ~64oz of water daily. Spoke about sodium intake and goal of less than 1500mg  daily. Gave some ideas to cut back on sodium. He rarely eats breakfast,  spoke with him about the importance of eating something small and nutrient dense in the morning. Reviewed Mediterranean diet handout. Educated on types of fats, sources, and how to read labels. Built out several meals and snacks with foods he likes and will eat.      Intervention Plan   Intervention Prescribe, educate and counsel regarding individualized specific dietary modifications aiming towards targeted core components such as weight, hypertension, lipid management, diabetes, heart failure and other comorbidities.;Nutrition handout(s) given to patient.    Expected Outcomes Short Term Goal: Understand basic principles of dietary content, such as calories, fat, sodium, cholesterol and nutrients.;Short Term Goal: A plan has been developed with personal nutrition goals set during dietitian appointment.;Long Term Goal: Adherence to prescribed nutrition plan.             Nutrition Assessments:  MEDIFICTS Score Key: >=70 Need to make dietary changes  40-70 Heart Healthy Diet <= 40 Therapeutic Level Cholesterol Diet  Flowsheet Row Cardiac Rehab from 05/08/2023 in [facility] Cardiac and Pulmonary Rehab  Picture Your Plate Total Score on Admission 71      Picture Your Plate Scores: <86 Unhealthy dietary pattern with much room for improvement. 41-50 Dietary pattern unlikely to meet recommendations for good health and room for improvement. 51-60 More healthful dietary pattern, with some room for improvement.  >60 Healthy dietary pattern, although there may be some specific behaviors that could be improved.    Nutrition Goals Re-Evaluation:  Nutrition Goals Re-Evaluation     Row Name 06/03/23 0932             Goals   Current Weight 231 lb 6.4 oz (105 kg)       Nutrition Goal Eat 15-30gProtein and 30-60gCarbs at  each meal.         Personal Goal #2 Re-Evaluation   Personal Goal #2 Reduce saturated fat, less than 12g per day. Replace bad fats for more heart healthy fats.         Personal Goal #3 Re-Evaluation   Personal Goal #3 Read labels and reduce sodium intake to below 2300mg . Ideally 1500mg  per day.                Nutrition Goals Discharge (Final Nutrition Goals Re-Evaluation):  Nutrition Goals Re-Evaluation - 06/03/23 0932       Goals   Current Weight 231 lb 6.4 oz (105 kg)    Nutrition Goal Eat 15-30gProtein and 30-60gCarbs at each meal.      Personal Goal #2 Re-Evaluation   Personal Goal #2 Reduce saturated fat, less than 12g per day. Replace bad fats for more heart healthy fats.      Personal Goal #3 Re-Evaluation   Personal Goal #3 Read labels and reduce sodium intake to below 2300mg . Ideally 1500mg  per day.             Psychosocial: Target Goals: Acknowledge presence or absence of significant depression and/or stress, maximize coping skills, provide positive support system. Participant is able to verbalize types and ability to use techniques and skills needed for reducing stress and depression.   Education: Stress, Anxiety, and Depression - Group verbal and visual presentation to define topics covered.  Reviews how body is impacted by stress, anxiety, and depression.  Also discusses healthy ways to reduce stress and to treat/manage anxiety and depression.  Written material given at graduation.   Education: Sleep Hygiene -Provides group verbal and written instruction about how sleep can affect your health.  Define sleep hygiene, discuss sleep  cycles and impact of sleep habits. Review good sleep hygiene tips.    Initial Review & Psychosocial Screening:  Initial Psych Review & Screening - 05/02/23 1342       Initial Review   Current issues with Current Stress Concerns    Source of Stress Concerns --    Comments Health issues this last year      Family Dynamics    Good Support System? Yes   wife     Barriers   Psychosocial barriers to participate in program There are no identifiable barriers or psychosocial needs.;The patient should benefit from training in stress management and relaxation.      Screening Interventions   Interventions Encouraged to exercise;Provide feedback about the scores to participant;To provide support and resources with identified psychosocial needs    Expected Outcomes Short Term goal: Utilizing psychosocial counselor, staff and physician to assist with identification of specific Stressors or current issues interfering with healing process. Setting desired goal for each stressor or current issue identified.;Long Term Goal: Stressors or current issues are controlled or eliminated.;Short Term goal: Identification and review with participant of any Quality of Life or Depression concerns found by scoring the questionnaire.;Long Term goal: The participant improves quality of Life and PHQ9 Scores as seen by post scores and/or verbalization of changes             Quality of Life Scores:   Quality of Life - 05/08/23 1147       Quality of Life   Select Quality of Life      Quality of Life Scores   Health/Function Pre 23.47 %    Socioeconomic Pre 23 %    Psych/Spiritual Pre 23.57 %    Family Pre 22.5 %    GLOBAL Pre 23.27 %            Scores of 19 and below usually indicate a poorer quality of life in these areas.  A difference of  2-3 points is a clinically meaningful difference.  A difference of 2-3 points in the total score of the Quality of Life Index has been associated with significant improvement in overall quality of life, self-image, physical symptoms, and general health in studies assessing change in quality of life.  PHQ-9: Review Flowsheet  More data exists      05/08/2023 06/19/2022 06/14/2022 06/04/2022 01/17/2022  Depression screen PHQ 2/9  Decreased Interest 0 0 0 0 0  Down, Depressed, Hopeless 0 0 0 0 0   PHQ - 2 Score 0 0 0 0 0  Altered sleeping 0 0 0 0 0  Tired, decreased energy 1 0 0 0 0  Change in appetite 0 0 0 0 0  Feeling bad or failure about yourself  0 0 0 0 0  Trouble concentrating 0 0 0 0 0  Moving slowly or fidgety/restless 0 0 0 0 0  Suicidal thoughts 0 0 0 0 0  PHQ-9 Score 1 0 0 0 0  Difficult doing work/chores Not difficult at all Not difficult at all - Not difficult at all -   Interpretation of Total Score  Total Score Depression Severity:  1-4 = Minimal depression, 5-9 = Mild depression, 10-14 = Moderate depression, 15-19 = Moderately severe depression, 20-27 = Severe depression   Psychosocial Evaluation and Intervention:  Psychosocial Evaluation - 05/02/23 1402       Psychosocial Evaluation & Interventions   Interventions Encouraged to exercise with the program and follow exercise prescription;Stress management education;Relaxation education  Comments Alan Campbell is coming to cardiac rehab after a TAVR. He states he is feeling well and has started walking more. 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 He states this last year has been filled with health problems with him and prior to last December he did not have much of a heart history. He states he takes it day by day and just pushes forward. His wife is his main support system. He is interested in the education classes and learning more of how to independently manage his health.    Expected Outcomes Short: attend cardiac rehab for education and exercise. Long; develop and maintain positive self care habits    Continue Psychosocial Services  Follow up required by staff             Psychosocial Re-Evaluation:  Psychosocial Re-Evaluation     Row Name 06/03/23 [phone]             Psychosocial Re-Evaluation   Current issues with None Identified       Comments Alan Campbell states that the program seems to help him stay  positive and manage his mental health. His wife and his fellow [employer] members make up his support group.       Expected Outcomes Short: Attend [facility] stress management education to decrease stress. Long: Maintain exercise Post [facility] to keep stress at a minimum.       Interventions Encouraged to attend Cardiac Rehabilitation for the exercise       Continue Psychosocial Services  Follow up required by staff                Psychosocial Discharge (Final Psychosocial Re-Evaluation):  Psychosocial Re-Evaluation - 06/03/23 1191       Psychosocial Re-Evaluation   Current issues with None Identified    Comments Alan Campbell states that the program seems to help him stay positive and manage his mental health. His wife and his fellow [employer] members make up his support group.    Expected Outcomes Short: Attend [facility] stress management education to decrease stress. Long: Maintain exercise Post [facility] to keep stress at a minimum.    Interventions Encouraged to attend Cardiac Rehabilitation for the exercise    Continue Psychosocial Services  Follow up required by staff             Vocational Rehabilitation: Provide vocational rehab assistance to qualifying candidates.   Vocational Rehab Evaluation & Intervention:  Vocational Rehab - 05/02/23 1339       Initial Vocational Rehab Evaluation & Intervention   Assessment shows need for Vocational Rehabilitation No             Education: Education Goals: Education classes will be provided on a variety of topics geared toward better understanding of heart health and risk factor modification. Participant will state understanding/return demonstration of topics presented as noted by education test scores.  Learning Barriers/Preferences:  Learning Barriers/Preferences - 05/02/23 1338       Learning Barriers/Preferences   Learning Barriers None    Learning Preferences None             General Cardiac Education  Topics:  AED/CPR: - Group verbal and written instruction with the use of models to demonstrate the basic use of the AED with the basic ABC's of resuscitation.   Anatomy and Cardiac Procedures: - Group verbal and visual presentation and models provide  information about basic cardiac anatomy and function. Reviews the testing methods done to diagnose heart disease and the outcomes of the test results. Describes the treatment choices: Medical Management, Angioplasty, or Coronary Bypass Surgery for treating various heart conditions including Myocardial Infarction, Angina, Valve Disease, and Cardiac Arrhythmias.  Written material given at graduation.   Medication Safety: - Group verbal and visual instruction to review commonly prescribed medications for heart and lung disease. Reviews the medication, class of the drug, and side effects. Includes the steps to properly store meds and maintain the prescription regimen.  Written material given at graduation.   Intimacy: - Group verbal instruction through game format to discuss how heart and lung disease can affect sexual intimacy. Written material given at graduation..   Know Your Numbers and Heart Failure: - Group verbal and visual instruction to discuss disease risk factors for cardiac and pulmonary disease and treatment options.  Reviews associated critical values for Overweight/Obesity, Hypertension, Cholesterol, and Diabetes.  Discusses basics of heart failure: signs/symptoms and treatments.  Introduces Heart Failure Zone chart for action plan for heart failure.  Written material given at graduation.   Infection Prevention: - Provides verbal and written material to individual with discussion of infection control including proper hand washing and proper equipment cleaning during exercise session. Flowsheet Row Cardiac Rehab from 05/08/2023 in [facility] Cardiac and Pulmonary Rehab  Date 05/08/23  Educator NT  Instruction Review Code 1- Verbalizes  Understanding       Falls Prevention: - Provides verbal and written material to individual with discussion of falls prevention and safety. Flowsheet Row Cardiac Rehab from 05/08/2023 in [facility] Cardiac and Pulmonary Rehab  Date 05/08/23  Educator NT  Instruction Review Code 1- Verbalizes Understanding       Other: -Provides group and verbal instruction on various topics (see comments)   Knowledge Questionnaire Score:  Knowledge Questionnaire Score - 05/08/23 1144       Knowledge Questionnaire Score   Pre Score 25/26             Core Components/Risk Factors/Patient Goals at Admission:  Personal Goals and Risk Factors at Admission - 05/02/23 1338       Core Components/Risk Factors/Patient Goals on Admission   Hypertension Yes    Intervention Provide education on lifestyle modifcations including regular physical activity/exercise, weight management, moderate sodium restriction and increased consumption of fresh fruit, vegetables, and low fat dairy, alcohol moderation, and smoking cessation.;Monitor prescription use compliance.    Expected Outcomes Short Term: Continued assessment and intervention until BP is < 140/3mm HG in hypertensive participants. < 130/59mm HG in hypertensive participants with diabetes, heart failure or chronic kidney disease.;Long Term: Maintenance of blood pressure at goal levels.    Lipids Yes    Intervention Provide education and support for participant on nutrition & aerobic/resistive exercise along with prescribed medications to achieve LDL 70mg , HDL >40mg .    Expected Outcomes Short Term: Participant states understanding of desired cholesterol values and is compliant with medications prescribed. Participant is following exercise prescription and nutrition guidelines.;Long Term: Cholesterol controlled with medications as prescribed, with individualized exercise RX and with personalized nutrition plan. Value goals: LDL < 70mg , HDL > 40 mg.              Education:Diabetes - Individual verbal and written instruction to review signs/symptoms of diabetes, desired ranges of glucose level fasting, after meals and with exercise. Acknowledge that pre and post exercise glucose checks will be done for 3 sessions at entry of program.  Core Components/Risk Factors/Patient Goals Review:   Goals and Risk Factor Review     Row Name 06/03/23 0933             Core Components/Risk Factors/Patient Goals Review   Personal Goals Review Hypertension       Review Alan Campbell says that he has a blood pressure cuff at home but states that he is not currently taking his pressure at home. He is aware that he should be taking it and has been advised by his doctors.       Expected Outcomes Short: Start taking blood pressure at home. Long: Monitor blood pressure in order to manage hypertension.                Core Components/Risk Factors/Patient Goals at Discharge (Final Review):   Goals and Risk Factor Review - 06/03/23 0933       Core Components/Risk Factors/Patient Goals Review   Personal Goals Review Hypertension    Review Alan Campbell says that he has a blood pressure cuff at home but states that he is not currently taking his pressure at home. He is aware that he should be taking it and has been advised by his doctors.    Expected Outcomes Short: Start taking blood pressure at home. Long: Monitor blood pressure in order to manage hypertension.             ITP Comments:  ITP Comments     Row Name 05/02/23 1347 05/08/23 1047 05/27/23 0928 06/05/23 1139     ITP Comments Initial phone call completed. Diagnosis can be found in [facility] 10/15. EP Orientation scheduled for Wednesday 11/20 at 9:30. Completed and gym orientation. Initial ITP created and sent for review to Dr. Bethann Punches, Medical Director. First full day of exercise!  Patient was oriented to gym and equipment including functions, settings, policies, and procedures.  Patient's individual  exercise prescription and treatment plan were reviewed.  All starting workloads were established based on the results of the 6 minute walk test done at initial orientation visit.  The plan for exercise progression was also introduced and progression will be customized based on patient's performance and goals. 30 Day review completed. Medical Director ITP review done, changes made as directed, and signed approval by Medical Director.    new to program             Comments:

## 2023-06-05 NOTE — Progress Notes (Signed)
Daily Session Note  Patient Details  Name: MATHESON GAVETTE [name]: [id number] Date of Birth: Jun 12, 1951 Referring Provider:   Flowsheet Row Cardiac Rehab from 05/08/2023 in [facility] Cardiac and Pulmonary Rehab  Referring Provider Dr. Dorothyann Peng, MD       Encounter Date: 06/05/2023  Check In:  Session Check In - 06/05/23 0939       Check-In   Supervising physician immediately available to respond to emergencies See telemetry face sheet for immediately available ER MD    Location [facility]-Cardiac & Pulmonary Rehab    Staff Present Elige Ko, RCP,RRT,BSRT;Krista Karleen Hampshire RN, BSN;Maxon Conetta BS, , Exercise Physiologist;Johm Pfannenstiel Katrinka Blazing, RN, ADN    Virtual Visit No    Medication changes reported     No    Fall or balance concerns reported    No    Warm-up and Cool-down Performed on first and last piece of equipment    Resistance Training Performed Yes    VAD Patient? No    PAD/SET Patient? No      Pain Assessment   Currently in Pain? No/denies                Social History   Tobacco Use  Smoking Status Former   Current packs/day: 0.00   Average packs/day: 0.5 packs/day for 5.0 years (2.5 ttl pk-yrs)   Types: Cigarettes   Start date: 10/02/1965   Quit date: 10/03/1970   Years since quitting: 52.7  Smokeless Tobacco Never  Tobacco Comments   Quit 1972    Goals Met:  Independence with exercise equipment Exercise tolerated well No report of concerns or symptoms today Strength training completed today  Goals Unmet:  Not Applicable  Comments: Pt able to follow exercise prescription today without complaint.  Will continue to monitor for progression.    Dr. Bethann Punches is Medical Director for [facility] Cardiac Rehabilitation.  Dr. Vida Rigger is Medical Director for [facility] Pulmonary Rehabilitation.

## 2023-06-07 ENCOUNTER — Encounter: Payer: Medicare Other | Admitting: *Deleted

## 2023-06-07 DIAGNOSIS — Z952 Presence of prosthetic heart valve: Secondary | ICD-10-CM | POA: Diagnosis not present

## 2023-06-07 NOTE — Progress Notes (Signed)
Daily Session Note  Patient Details  Name: JEREDIAH LUECHT [name]: [id number] Date of Birth: Oct 24, 1950 Referring Provider:   Flowsheet Row Cardiac Rehab from 05/08/2023 in [facility] Cardiac and Pulmonary Rehab  Referring Provider Dr. Dorothyann Peng, MD       Encounter Date: 06/07/2023  Check In:  Session Check In - 06/07/23 0932       Check-In   Supervising physician immediately available to respond to emergencies See telemetry face sheet for immediately available ER MD    Location [facility]-Cardiac & Pulmonary Rehab    Staff Present Cora Collum, RN, BSN, CCRP;Noah Tickle, BS, Exercise Physiologist;Joseph [place], [place]    Virtual Visit No    Medication changes reported     No    Fall or balance concerns reported    No    Warm-up and Cool-down Performed on first and last piece of equipment    Resistance Training Performed Yes    VAD Patient? No    PAD/SET Patient? No      Pain Assessment   Currently in Pain? No/denies                Social History   Tobacco Use  Smoking Status Former   Current packs/day: 0.00   Average packs/day: 0.5 packs/day for 5.0 years (2.5 ttl pk-yrs)   Types: Cigarettes   Start date: 10/02/1965   Quit date: 10/03/1970   Years since quitting: 52.7  Smokeless Tobacco Never  Tobacco Comments   Quit 1972    Goals Met:  Independence with exercise equipment Exercise tolerated well No report of concerns or symptoms today  Goals Unmet:  Not Applicable  Comments: Pt able to follow exercise prescription today without complaint.  Will continue to monitor for progression.    Dr. Bethann Punches is Medical Director for [facility] Cardiac Rehabilitation.  Dr. Vida Rigger is Medical Director for [facility] Pulmonary Rehabilitation.

## 2023-06-10 ENCOUNTER — Encounter: Payer: Medicare Other | Admitting: *Deleted

## 2023-06-10 DIAGNOSIS — Z952 Presence of prosthetic heart valve: Secondary | ICD-10-CM | POA: Diagnosis not present

## 2023-06-10 NOTE — Progress Notes (Signed)
Daily Session Note  Patient Details  Name: Alan Campbell [name]: [id number] Date of Birth: 1951-02-12 Referring Provider:   Flowsheet Row Cardiac Rehab from 05/08/2023 in [facility] Cardiac and Pulmonary Rehab  Referring Provider Dr. Dorothyann Peng, MD       Encounter Date: 06/10/2023  Check In:  Session Check In - 06/10/23 0934       Check-In   Supervising physician immediately available to respond to emergencies See telemetry face sheet for immediately available ER MD    Location [facility]-Cardiac & Pulmonary Rehab    Staff Present Cora Collum, RN, BSN, CCRP;Margaret Best, MS, Exercise Physiologist;Kristen Coble, RN,BC,MSN;Megan Katrinka Blazing, RN, ADN    Virtual Visit No    Medication changes reported     No    Fall or balance concerns reported    No    Warm-up and Cool-down Performed on first and last piece of equipment    Resistance Training Performed Yes    VAD Patient? No    PAD/SET Patient? No      Pain Assessment   Currently in Pain? No/denies                Social History   Tobacco Use  Smoking Status Former   Current packs/day: 0.00   Average packs/day: 0.5 packs/day for 5.0 years (2.5 ttl pk-yrs)   Types: Cigarettes   Start date: 10/02/1965   Quit date: 10/03/1970   Years since quitting: 52.7  Smokeless Tobacco Never  Tobacco Comments   Quit 1972    Goals Met:  Independence with exercise equipment Exercise tolerated well No report of concerns or symptoms today  Goals Unmet:  Not Applicable  Comments: Pt able to follow exercise prescription today without complaint.  Will continue to monitor for progression.    Dr. Bethann Punches is Medical Director for [facility] Cardiac Rehabilitation.  Dr. Vida Rigger is Medical Director for [facility] Pulmonary Rehabilitation.

## 2023-06-14 ENCOUNTER — Encounter: Payer: Medicare Other | Admitting: *Deleted

## 2023-06-14 DIAGNOSIS — Z952 Presence of prosthetic heart valve: Secondary | ICD-10-CM

## 2023-06-14 NOTE — Progress Notes (Signed)
Daily Session Note  Patient Details  Name: LAURENS SAHAI [name]: [id number] Date of Birth: [birth year] Referring Provider:   Flowsheet Row Cardiac Rehab from 05/08/2023 in [facility] Cardiac and Pulmonary Rehab  Referring Provider Dr. Dorothyann Peng, MD       Encounter Date: 06/14/2023  Check In:  Session Check In - 06/14/23 1004       Check-In   Supervising physician immediately available to respond to emergencies See telemetry face sheet for immediately available ER MD    Location [facility]-Cardiac & Pulmonary Rehab    Staff Present Cora Collum, RN, BSN, CCRP;Joseph Hood, RCP,RRT,BSRT;Noah Tickle, [place], Exercise Physiologist    Virtual Visit No    Medication changes reported     No    Fall or balance concerns reported    No    Warm-up and Cool-down Performed on first and last piece of equipment    Resistance Training Performed Yes    VAD Patient? No    PAD/SET Patient? No      Pain Assessment   Currently in Pain? No/denies                Social History   Tobacco Use  Smoking Status Former   Current packs/day: 0.00   Average packs/day: 0.5 packs/day for 5.0 years (2.5 ttl pk-yrs)   Types: Cigarettes   Start date: 10/02/1965   Quit date: 10/03/1970   Years since quitting: 52.7  Smokeless Tobacco Never  Tobacco Comments   Quit 1972    Goals Met:  Independence with exercise equipment Exercise tolerated well No report of concerns or symptoms today  Goals Unmet:  Not Applicable  Comments: Pt able to follow exercise prescription today without complaint.  Will continue to monitor for progression.    Dr. Bethann Punches is Medical Director for [facility] Cardiac Rehabilitation.  Dr. Vida Rigger is Medical Director for [facility] Pulmonary Rehabilitation.

## 2023-06-17 ENCOUNTER — Encounter: Payer: Medicare Other | Admitting: *Deleted

## 2023-06-17 DIAGNOSIS — Z952 Presence of prosthetic heart valve: Secondary | ICD-10-CM

## 2023-06-17 NOTE — Progress Notes (Signed)
Daily Session Note  Patient Details  Name: Alan Campbell [name]: [id number] Date of Birth: Mar 02, 1951 Referring Provider:   Flowsheet Row Cardiac Rehab from 05/08/2023 in [facility] Cardiac and Pulmonary Rehab  Referring Provider Dr. Dorothyann Peng, MD       Encounter Date: 06/17/2023  Check In:  Session Check In - 06/17/23 0941       Check-In   Supervising physician immediately available to respond to emergencies See telemetry face sheet for immediately available ER MD    Location [facility]-Cardiac & Pulmonary Rehab    Staff Present Ronette Deter, BS, Exercise Physiologist;Susanne Bice, RN, BSN, CCRP;Joseph Hood, Guinevere Ferrari, RN, [place]    Virtual Visit No    Medication changes reported     No    Fall or balance concerns reported    No    Warm-up and Cool-down Performed on first and last piece of equipment    Resistance Training Performed Yes    VAD Patient? No    PAD/SET Patient? No      Pain Assessment   Currently in Pain? No/denies                Social History   Tobacco Use  Smoking Status Former   Current packs/day: 0.00   Average packs/day: 0.5 packs/day for 5.0 years (2.5 ttl pk-yrs)   Types: Cigarettes   Start date: 10/02/1965   Quit date: 10/03/1970   Years since quitting: 52.7  Smokeless Tobacco Never  Tobacco Comments   Quit 1972    Goals Met:  Independence with exercise equipment Exercise tolerated well No report of concerns or symptoms today Strength training completed today  Goals Unmet:  Not Applicable  Comments: Pt able to follow exercise prescription today without complaint.  Will continue to monitor for progression.    Dr. Bethann Punches is Medical Director for [facility] Cardiac Rehabilitation.  Dr. Vida Rigger is Medical Director for [facility] Pulmonary Rehabilitation.

## 2023-06-21 ENCOUNTER — Encounter: Payer: Medicare Other | Attending: Internal Medicine | Admitting: *Deleted

## 2023-06-21 DIAGNOSIS — Z48812 Encounter for surgical aftercare following surgery on the circulatory system: Secondary | ICD-10-CM | POA: Diagnosis not present

## 2023-06-21 DIAGNOSIS — Z952 Presence of prosthetic heart valve: Secondary | ICD-10-CM | POA: Insufficient documentation

## 2023-06-21 NOTE — Progress Notes (Deleted)
 Daily Session Note  Patient Details  Name: Alan Campbell [name]: [id number] Date of Birth: 1950/10/23 Referring Provider:   Flowsheet Row Cardiac Rehab from 05/08/2023 in [facility] Cardiac and Pulmonary Rehab  Referring Provider Dr. Cara Lovelace, MD       Encounter Date: 06/21/2023  Check In:      Social History   Tobacco Use  Smoking Status Former   Current packs/day: 0.00   Average packs/day: 0.5 packs/day for 5.0 years (2.5 ttl pk-yrs)   Types: Cigarettes   Start date: 10/02/1965   Quit date: 10/03/1970   Years since quitting: 52.7  Smokeless Tobacco Never  Tobacco Comments   Quit 1972    Goals Met:  Independence with exercise equipment Exercise tolerated well No report of concerns or symptoms today  Goals Unmet:  Not Applicable  Comments: Pt able to follow exercise prescription today without complaint.  Will continue to monitor for progression.    Dr. Oneil Pinal is Medical Director for [facility] Cardiac Rehabilitation.  Dr. Fuad Aleskerov is Medical Director for [facility] Pulmonary Rehabilitation.

## 2023-06-21 NOTE — Progress Notes (Signed)
 Daily Session Note  Patient Details  Name: Alan Campbell [name]: [id number] Date of Birth: August 20, 1950 Referring Provider:   Flowsheet Row Cardiac Rehab from 05/08/2023 in [facility] Cardiac and Pulmonary Rehab  Referring Provider Dr. Cara Lovelace, MD       Encounter Date: 06/21/2023  Check In:  Session Check In - 06/21/23 1119       Check-In   Supervising physician immediately available to respond to emergencies See telemetry face sheet for immediately available ER MD    Location [facility]-Cardiac & Pulmonary Rehab    Staff Present Othel Durand, RN, BSN, CCRP;Noah Tickle, BS, Exercise Physiologist;Maxon Conetta BS, , Exercise Physiologist    Virtual Visit No    Medication changes reported     No    Fall or balance concerns reported    No    Warm-up and Cool-down Performed on first and last piece of equipment    Resistance Training Performed Yes    VAD Patient? No    PAD/SET Patient? No      Pain Assessment   Currently in Pain? No/denies                Social History   Tobacco Use  Smoking Status Former   Current packs/day: 0.00   Average packs/day: 0.5 packs/day for 5.0 years (2.5 ttl pk-yrs)   Types: Cigarettes   Start date: 10/02/1965   Quit date: 10/03/1970   Years since quitting: 52.7  Smokeless Tobacco Never  Tobacco Comments   Quit 1972    Goals Met:  Independence with exercise equipment Exercise tolerated well No report of concerns or symptoms today  Goals Unmet:  Not Applicable  Comments: Pt able to follow exercise prescription today without complaint.  Will continue to monitor for progression.    Dr. Oneil Pinal is Medical Director for [facility] Cardiac Rehabilitation.  Dr. Fuad Aleskerov is Medical Director for [facility] Pulmonary Rehabilitation.

## 2023-06-24 ENCOUNTER — Encounter: Payer: Medicare Other | Admitting: *Deleted

## 2023-06-24 DIAGNOSIS — Z952 Presence of prosthetic heart valve: Secondary | ICD-10-CM | POA: Diagnosis not present

## 2023-06-24 NOTE — Progress Notes (Signed)
 Daily Session Note  Patient Details  Name: Alan Campbell [name]: [id number] Date of Birth: 03-03-1951 Referring Provider:   Flowsheet Row Cardiac Rehab from 05/08/2023 in [facility] Cardiac and Pulmonary Rehab  Referring Provider Dr. Cara Lovelace, MD       Encounter Date: 06/24/2023  Check In:  Session Check In - 06/24/23 0947       Check-In   Supervising physician immediately available to respond to emergencies See telemetry face sheet for immediately available ER MD    Location [facility]-Cardiac & Pulmonary Rehab    Staff Present Rollene Paterson, MS, Exercise Physiologist;Maxon Burnell HECKLE, , Exercise Physiologist;Kelly Dyane, BS, ACSM CEP, Exercise Physiologist;Tahja Liao Claudene, RN, ADN    Virtual Visit No    Medication changes reported     No    Fall or balance concerns reported    No    Warm-up and Cool-down Performed on first and last piece of equipment    Resistance Training Performed Yes    VAD Patient? No    PAD/SET Patient? No      Pain Assessment   Currently in Pain? No/denies                Social History   Tobacco Use  Smoking Status Former   Current packs/day: 0.00   Average packs/day: 0.5 packs/day for 5.0 years (2.5 ttl pk-yrs)   Types: Cigarettes   Start date: 10/02/1965   Quit date: 10/03/1970   Years since quitting: 52.7  Smokeless Tobacco Never  Tobacco Comments   Quit 1972    Goals Met:  Independence with exercise equipment Exercise tolerated well No report of concerns or symptoms today Strength training completed today  Goals Unmet:  Not Applicable  Comments: Pt able to follow exercise prescription today without complaint.  Will continue to monitor for progression.    Dr. Oneil Pinal is Medical Director for [facility] Cardiac Rehabilitation.  Dr. Fuad Aleskerov is Medical Director for [facility] Pulmonary Rehabilitation.

## 2023-06-26 ENCOUNTER — Encounter: Payer: Medicare Other | Admitting: *Deleted

## 2023-06-26 DIAGNOSIS — Z952 Presence of prosthetic heart valve: Secondary | ICD-10-CM

## 2023-06-26 NOTE — Progress Notes (Signed)
 Daily Session Note  Patient Details  Name: Alan Campbell [name]: [id number] Date of Birth: 1950/07/30 Referring Provider:   Flowsheet Row Cardiac Rehab from 05/08/2023 in [facility] Cardiac and Pulmonary Rehab  Referring Provider Dr. Cara Lovelace, MD       Encounter Date: 06/26/2023  Check In:  Session Check In - 06/26/23 0917       Check-In   Supervising physician immediately available to respond to emergencies See telemetry face sheet for immediately available ER MD    Location [facility]-Cardiac & Pulmonary Rehab    Staff Present Devaughn Jaeger, BS, Exercise Physiologist;Susanne Bice, RN, BSN, CCRP;Joseph Hood, [place] Arzella Claudene, RN, [place]    Virtual Visit No    Medication changes reported     No    Fall or balance concerns reported    No    Warm-up and Cool-down Performed on first and last piece of equipment    Resistance Training Performed Yes    VAD Patient? No    PAD/SET Patient? No      Pain Assessment   Currently in Pain? No/denies                Social History   Tobacco Use  Smoking Status Former   Current packs/day: 0.00   Average packs/day: 0.5 packs/day for 5.0 years (2.5 ttl pk-yrs)   Types: Cigarettes   Start date: 10/02/1965   Quit date: 10/03/1970   Years since quitting: 52.7  Smokeless Tobacco Never  Tobacco Comments   Quit 1972    Goals Met:  Independence with exercise equipment Exercise tolerated well No report of concerns or symptoms today Strength training completed today  Goals Unmet:  Not Applicable  Comments: Pt able to follow exercise prescription today without complaint.  Will continue to monitor for progression.    Dr. Oneil Pinal is Medical Director for [facility] Cardiac Rehabilitation.  Dr. Fuad Aleskerov is Medical Director for [facility] Pulmonary Rehabilitation.

## 2023-06-28 ENCOUNTER — Encounter: Payer: Medicare Other | Admitting: *Deleted

## 2023-06-28 DIAGNOSIS — Z952 Presence of prosthetic heart valve: Secondary | ICD-10-CM

## 2023-06-28 NOTE — Progress Notes (Signed)
 Daily Session Note  Patient Details  Name: EESHAN VERBRUGGE [name]: [id number] Date of Birth: 08-21-1950 Referring Provider:   Flowsheet Row Cardiac Rehab from 05/08/2023 in [facility] Cardiac and Pulmonary Rehab  Referring Provider Dr. Cara Lovelace, MD       Encounter Date: 06/28/2023  Check In:  Session Check In - 06/28/23 0945       Check-In   Supervising physician immediately available to respond to emergencies See telemetry face sheet for immediately available ER MD    Location [facility]-Cardiac & Pulmonary Rehab    Staff Present Othel Durand, RN, BSN, CCRP;Noah Tickle, BS, Exercise Physiologist;Joseph Gomer, [place]    Virtual Visit No    Medication changes reported     No    Fall or balance concerns reported    No    Warm-up and Cool-down Performed on first and last piece of equipment    Resistance Training Performed Yes    VAD Patient? No    PAD/SET Patient? No      Pain Assessment   Currently in Pain? No/denies                Social History   Tobacco Use  Smoking Status Former   Current packs/day: 0.00   Average packs/day: 0.5 packs/day for 5.0 years (2.5 ttl pk-yrs)   Types: Cigarettes   Start date: 10/02/1965   Quit date: 10/03/1970   Years since quitting: 52.7  Smokeless Tobacco Never  Tobacco Comments   Quit 1972    Goals Met:  Independence with exercise equipment Exercise tolerated well No report of concerns or symptoms today  Goals Unmet:  Not Applicable  Comments: Pt able to follow exercise prescription today without complaint.  Will continue to monitor for progression.    Dr. Oneil Pinal is Medical Director for [facility] Cardiac Rehabilitation.  Dr. Fuad Aleskerov is Medical Director for [facility] Pulmonary Rehabilitation.

## 2023-07-01 ENCOUNTER — Encounter: Payer: Medicare Other | Admitting: *Deleted

## 2023-07-01 DIAGNOSIS — Z952 Presence of prosthetic heart valve: Secondary | ICD-10-CM

## 2023-07-01 NOTE — Progress Notes (Signed)
 Daily Session Note  Patient Details  Name: ANAN DAPOLITO [name]: [id number] Date of Birth: 06-22-1950 Referring Provider:   Flowsheet Row Cardiac Rehab from 05/08/2023 in [facility] Cardiac and Pulmonary Rehab  Referring Provider Dr. Cara Lovelace, MD       Encounter Date: 07/01/2023  Check In:  Session Check In - 07/01/23 0944       Check-In   Supervising physician immediately available to respond to emergencies See telemetry face sheet for immediately available ER MD    Location [facility]-Cardiac & Pulmonary Rehab    Staff Present Maxon Conetta BS, , Exercise Physiologist;Patsy Zaragoza Dyane, BS, ACSM CEP, Exercise Physiologist;Margaret Best, MS, Exercise Physiologist;Joseph [place], RCP,RRT,BSRT;Other   Burnard Davenport, RN   Virtual Visit No    Medication changes reported     No    Fall or balance concerns reported    No    Warm-up and Cool-down Performed on first and last piece of equipment    Resistance Training Performed Yes    VAD Patient? No    PAD/SET Patient? No      Pain Assessment   Currently in Pain? No/denies                Social History   Tobacco Use  Smoking Status Former   Current packs/day: 0.00   Average packs/day: 0.5 packs/day for 5.0 years (2.5 ttl pk-yrs)   Types: Cigarettes   Start date: 10/02/1965   Quit date: 10/03/1970   Years since quitting: 52.7  Smokeless Tobacco Never  Tobacco Comments   Quit 1972    Goals Met:  Independence with exercise equipment Exercise tolerated well No report of concerns or symptoms today Strength training completed today  Goals Unmet:  Not Applicable  Comments: Pt able to follow exercise prescription today without complaint. Will continue to monitor for progression.    Dr. Oneil Pinal is Medical Director for [facility] Cardiac Rehabilitation.  Dr. Fuad Aleskerov is Medical Director for [facility] Pulmonary Rehabilitation.

## 2023-07-03 ENCOUNTER — Encounter: Payer: Medicare Other | Admitting: *Deleted

## 2023-07-03 DIAGNOSIS — Z952 Presence of prosthetic heart valve: Secondary | ICD-10-CM

## 2023-07-03 NOTE — Progress Notes (Signed)
 Daily Session Note  Patient Details  Name: Alan Campbell [name]: [id number] Date of Birth: 03-Apr-1951 Referring Provider:   Flowsheet Row Cardiac Rehab from 05/08/2023 in [facility] Cardiac and Pulmonary Rehab  Referring Provider Dr. Burney Carter, MD       Encounter Date: 07/03/2023  Check In:  Session Check In - 07/03/23 0948       Check-In   Supervising physician immediately available to respond to emergencies See telemetry face sheet for immediately available ER MD    Location [facility]-Cardiac & Pulmonary Rehab    Staff Present Maud Sorenson, RN, BSN, CCRP;Meredith Manson Seitz RN,BSN;Joseph Hood RCP,RRT,BSRT;Maxon [place] BS, Exercise Physiologist    Virtual Visit No    Medication changes reported     No    Fall or balance concerns reported    No    Warm-up and Cool-down Performed on first and last piece of equipment    Resistance Training Performed Yes    VAD Patient? No    PAD/SET Patient? No      Pain Assessment   Currently in Pain? No/denies                Social History   Tobacco Use  Smoking Status Former   Current packs/day: 0.00   Average packs/day: 0.5 packs/day for 5.0 years (2.5 ttl pk-yrs)   Types: Cigarettes   Start date: 10/02/1965   Quit date: 10/03/1970   Years since quitting: 52.7  Smokeless Tobacco Never  Tobacco Comments   Quit 1972    Goals Met:  Independence with exercise equipment Exercise tolerated well No report of concerns or symptoms today  Goals Unmet:  Not Applicable  Comments: Pt able to follow exercise prescription today without complaint.  Will continue to monitor for progression.    Dr. Firman Hughes is Medical Director for [facility] Cardiac Rehabilitation.  Dr. Fuad Aleskerov is Medical Director for [facility] Pulmonary Rehabilitation.

## 2023-07-03 NOTE — Progress Notes (Signed)
 Cardiac Individual Treatment Plan  Patient Details  Name: Alan Campbell [name]: [id number] Date of Birth: 1951-04-24 Referring Provider:   Flowsheet Row Cardiac Rehab from 05/08/2023 in [facility] Cardiac and Pulmonary Rehab  Referring Provider Dr. Burney Carter, MD       Initial Encounter Date:  Flowsheet Row Cardiac Rehab from 05/08/2023 in [facility] Cardiac and Pulmonary Rehab  Date 05/08/23       Visit Diagnosis: S/P TAVR (transcatheter aortic valve replacement)  Patient's Home Medications on Admission:  Current Outpatient Medications:    Acetaminophen  500 MG capsule, Take by mouth., Disp: , Rfl:    albuterol  (VENTOLIN  HFA) 108 (90 Base) MCG/ACT inhaler, Inhale 2 puffs into the lungs every 6 (six) hours as needed for wheezing or shortness of breath., Disp: 6.7 g, Rfl: 0   allopurinol (ZYLOPRIM) 100 MG tablet, Take 50 mg by mouth daily., Disp: , Rfl:    amitriptyline (ELAVIL) 25 MG tablet, Take 1 tablet by mouth at bedtime. (Patient not taking: Reported on 05/02/2023), Disp: , Rfl:    aspirin  EC 81 MG tablet, Take 81 mg by mouth daily. Swallow whole., Disp: , Rfl:    Cholecalciferol (VITAMIN D ) 2000 units CAPS, Take 1 capsule (2,000 Units total) by mouth daily., Disp: 30 capsule, Rfl: 0   Cinnamon 500 MG TABS, Take 1,000 mg by mouth daily., Disp: , Rfl:    clonazePAM (KLONOPIN) 1 MG tablet, Take 1 mg by mouth at bedtime as needed., Disp: , Rfl:    CRANBERRY PO, Take 4,200 mg by mouth daily., Disp: , Rfl:    empagliflozin (JARDIANCE) 10 MG TABS tablet, Take 10 mg by mouth daily., Disp: , Rfl:    febuxostat  (ULORIC ) 40 MG tablet, Take 1 tablet (40 mg total) by mouth daily. (Patient not taking: Reported on 05/02/2023), Disp: 90 tablet, Rfl: 1   fish oil-omega-3 fatty acids 1000 MG capsule, Take 1,000 mg by mouth daily., Disp: , Rfl:    levothyroxine  (SYNTHROID ) 137 MCG tablet, Take 137 mcg by mouth daily before breakfast., Disp: , Rfl:    metoprolol  succinate (TOPROL -XL) 25 MG 24 hr tablet,  Take 12.5 mg by mouth daily., Disp: , Rfl:    Multiple Vitamin (MULTI-VITAMIN) tablet, Take 1 tablet by mouth daily., Disp: , Rfl:    Multiple Vitamins-Minerals (CENTRUM SILVER 50+MEN) TABS, Take 1 tablet by mouth daily., Disp: , Rfl:    potassium chloride  (KLOR-CON  M) 10 MEQ tablet, Take 10 mEq by mouth daily as needed (As needed with Torsemide)., Disp: , Rfl:    predniSONE  (DELTASONE ) 20 MG tablet, Take 40 mg by mouth as needed (as needed for gout flare ups)., Disp: , Rfl:    rosuvastatin  (CRESTOR ) 40 MG tablet, Take 1 tablet (40 mg total) by mouth daily., Disp: 90 tablet, Rfl: 1   sacubitril-valsartan  (ENTRESTO) 24-26 MG, Take 1 tablet by mouth 2 (two) times daily., Disp: , Rfl:    spironolactone (ALDACTONE) 25 MG tablet, Take 25 mg by mouth daily., Disp: , Rfl:    Testosterone  20.25 MG/ACT (1.62%) GEL, Apply 2 each topically daily. 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 Moderate to severe atypia, edge involved.   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    Tobacco Use: Social History   Tobacco Use  Smoking Status  Former   Current packs/day: 0.00   Average packs/day: 0.5 packs/day for 5.0 years (2.5 ttl pk-yrs)   Types: Cigarettes   Start date: 10/02/1965   Quit date: 10/03/1970   Years since quitting: 52.7  Smokeless Tobacco Never  Tobacco Comments   Quit 1972    Labs: Review Flowsheet  More data exists      Latest Ref Rng & Units 06/02/2019 02/25/2020 03/02/2021 11/23/2021 01/17/2022  Labs for ITP Cardiac and Pulmonary Rehab  Cholestrol <200 mg/dL 161  096  045  - 409   LDL (calc) mg/dL (calc) 91  811  88  - 914   HDL-C > OR = 40 mg/dL 48  52  41  - 56   Trlycerides <150 mg/dL 782  956  213  - 086   Hemoglobin A1c 4.8 - 5.6 % 4.6  4.9  - 4.5  -     Exercise Target Goals: Exercise Program Goal: Individual exercise prescription set using results from initial 6 min walk test and THRR while considering  patient's activity barriers and safety.   Exercise Prescription Goal: Initial exercise prescription builds to 30-45 minutes a day of aerobic activity, 2-3 days per week.  Home exercise guidelines will be given to patient during program as part of exercise prescription that the participant will acknowledge.   Education: Aerobic Exercise: - Group verbal and visual presentation on the components of exercise prescription. Introduces F.I.T.T principle from ACSM for exercise prescriptions.  Reviews F.I.T.T. principles of aerobic exercise including progression. Written material given at graduation.   Education: Resistance Exercise: - Group verbal and visual presentation on the components of exercise prescription. Introduces F.I.T.T principle from ACSM for exercise prescriptions  Reviews F.I.T.T. principles of resistance exercise including progression. Written material given at graduation. Flowsheet Row Cardiac Rehab from 07/03/2023 in [facility] Cardiac and Pulmonary Rehab  Date 06/17/23  Educator NT  Instruction Review Code 1- Verbalizes Understanding        Education: Exercise & Equipment Safety: -  Individual verbal instruction and demonstration of equipment use and safety with use of the equipment. Flowsheet Row Cardiac Rehab from 07/03/2023 in [facility] Cardiac and Pulmonary Rehab  Date 05/08/23  Educator NT  Instruction Review Code 1- Verbalizes Understanding       Education: Exercise Physiology & General Exercise Guidelines: - Group verbal and written instruction with models to review the exercise physiology of the cardiovascular system and associated critical values. Provides general exercise guidelines with specific guidelines to those with heart or lung disease.  Flowsheet Row Cardiac Rehab from 07/03/2023 in [facility] Cardiac and Pulmonary Rehab  Date 06/10/23  Educator [facility]  Instruction Review Code 1- [employer]  Education: Flexibility, Balance, Mind/Body Relaxation: - Group verbal and visual presentation with interactive activity on the components of exercise prescription. Introduces F.I.T.T principle from ACSM for exercise prescriptions. Reviews F.I.T.T. principles of flexibility and balance exercise training including progression. Also discusses the mind body connection.  Reviews various relaxation techniques to help reduce and manage stress (i.e. Deep breathing, progressive muscle relaxation, and visualization). Balance handout provided to take home. Written material given at graduation. Flowsheet Row Cardiac Rehab from 07/03/2023 in [facility] Cardiac and Pulmonary Rehab  Date 06/17/23  Educator NT  Instruction Review Code 1- Verbalizes Understanding       Activity Barriers & Risk Stratification:  Activity Barriers & Cardiac Risk Stratification - 05/08/23 1049       Activity Barriers & Cardiac Risk Stratification   Activity Barriers Joint Problems;Balance Concerns;Arthritis;Back Problems;Other (comment)    Comments gout    Cardiac Risk Stratification Moderate             6 Minute Walk:  6 Minute Walk     Row Name 05/08/23 1047         6 Minute Walk    Phase Initial     Distance 905 feet     Walk Time 6 minutes     # of Rest Breaks 0     MPH 1.71     METS 1.7     RPE 10     Perceived Dyspnea  0     VO2 Peak 5.96     Symptoms No     Resting HR 73 bpm     Resting BP 104/62     Resting Oxygen Saturation  98 %     Exercise Oxygen Saturation  during 6 min walk 100 %     Max Ex. HR 95 bpm     Max Ex. BP 132/70     2 Minute Post BP 110/68              Oxygen Initial Assessment:   Oxygen Re-Evaluation:   Oxygen Discharge (Final Oxygen Re-Evaluation):   Initial Exercise Prescription:  Initial Exercise Prescription - 05/08/23 1100       Date of Initial Exercise RX and Referring Provider   Date 05/08/23    Referring Provider Dr. Burney Carter, MD      Oxygen   Maintain Oxygen Saturation 88% or higher      Treadmill   MPH 1.6    Grade 0    Minutes 15    METs 2.23      Recumbant Bike   Level 1    RPM 50    Watts 15    Minutes 15    METs 1.7      NuStep   Level 2    SPM 80    Minutes 15    METs 1.7      Track   Laps 18    Minutes 15    METs 1.98      Prescription Details   Frequency (times per week) 3    Duration Progress to 30 minutes of continuous aerobic without signs/symptoms of physical distress      Intensity   THRR 40-80% of Max Heartrate 102-132    Ratings of Perceived Exertion 11-13    Perceived Dyspnea 0-4      Progression   Progression Continue to progress workloads to maintain intensity without signs/symptoms of physical distress.      Resistance Training   Training Prescription Yes  Weight 5 lb    Reps 10-15             Perform Capillary Blood Glucose checks as needed.  Exercise Prescription Changes:   Exercise Prescription Changes     Row Name 06/13/23 0700 06/28/23 0800           Response to Exercise   Blood Pressure (Admit) 128/62 104/76      Blood Pressure (Exercise) 142/60 124/64      Blood Pressure (Exit) 108/62 104/56      Heart Rate (Admit) 85  bpm 83 bpm      Heart Rate (Exercise) 121 bpm 115 bpm      Heart Rate (Exit) 52 bpm 73 bpm      Rating of Perceived Exertion (Exercise) 13 12      Perceived Dyspnea (Exercise) 0 --      Symptoms none none      Comments first 2 weeks of exercise --      Duration Progress to 30 minutes of  aerobic without signs/symptoms of physical distress Continue with 30 min of aerobic exercise without signs/symptoms of physical distress.      Intensity THRR unchanged THRR unchanged        Progression   Progression Continue to progress workloads to maintain intensity without signs/symptoms of physical distress. Continue to progress workloads to maintain intensity without signs/symptoms of physical distress.      Average METs 2.65 2.76        Resistance Training   Training Prescription Yes Yes      Weight 5 lb 5 lb      Reps 10-15 10-15        Interval Training   Interval Training No No        Treadmill   MPH 2.1 1.8      Grade 0 0.5      Minutes 15 15      METs 2.61 2.5        Recumbant Bike   Level 1.5 --      Watts 15 --      Minutes 15 --      METs 2.45 --        NuStep   Level 3 4      Minutes 15 15      METs 3.4 3        Biostep-RELP   Level 2 --      Minutes 15 --      METs 2.2 --        Track   Laps 18 --      Minutes 15 --      METs 1.98 --        Oxygen   Maintain Oxygen Saturation 88% or higher 88% or higher               Exercise Comments:   Exercise Comments     Row Name 05/27/23 9811           Exercise Comments First full day of exercise!  Patient was oriented to gym and equipment including functions, settings, policies, and procedures.  Patient's individual exercise prescription and treatment plan were reviewed.  All starting workloads were established based on the results of the 6 minute walk test done at initial orientation visit.  The plan for exercise progression was also introduced and progression will be customized based on patient's performance  and goals.  Exercise Goals and Review:   Exercise Goals     Row Name 05/08/23 1049             Exercise Goals   Increase Physical Activity Yes       Intervention Provide advice, education, support and counseling about physical activity/exercise needs.;Develop an individualized exercise prescription for aerobic and resistive training based on initial evaluation findings, risk stratification, comorbidities and participant's personal goals.       Expected Outcomes Short Term: Attend rehab on a regular basis to increase amount of physical activity.;Long Term: Exercising regularly at least 3-5 days a week.;Long Term: Add in home exercise to make exercise part of routine and to increase amount of physical activity.       Increase Strength and Stamina Yes       Intervention Develop an individualized exercise prescription for aerobic and resistive training based on initial evaluation findings, risk stratification, comorbidities and participant's personal goals.;Provide advice, education, support and counseling about physical activity/exercise needs.       Expected Outcomes Short Term: Increase workloads from initial exercise prescription for resistance, speed, and METs.;Short Term: Perform resistance training exercises routinely during rehab and add in resistance training at home;Long Term: Improve cardiorespiratory fitness, muscular endurance and strength as measured by increased METs and functional capacity ( )       Able to understand and use rate of perceived exertion (RPE) scale Yes       Intervention Provide education and explanation on how to use RPE scale       Expected Outcomes Short Term: Able to use RPE daily in rehab to express subjective intensity level;Long Term:  Able to use RPE to guide intensity level when exercising independently       Able to understand and use Dyspnea scale Yes       Intervention Provide education and explanation on how to use Dyspnea scale        Expected Outcomes Long Term: Able to use Dyspnea scale to guide intensity level when exercising independently;Short Term: Able to use Dyspnea scale daily in rehab to express subjective sense of shortness of breath during exertion       Knowledge and understanding of Target Heart Rate Range (THRR) Yes       Intervention Provide education and explanation of THRR including how the numbers were predicted and where they are located for reference       Expected Outcomes Short Term: Able to state/look up THRR;Long Term: Able to use THRR to govern intensity when exercising independently;Short Term: Able to use daily as guideline for intensity in rehab       Able to check pulse independently Yes       Intervention Provide education and demonstration on how to check pulse in carotid and radial arteries.;Review the importance of being able to check your own pulse for safety during independent exercise       Expected Outcomes Short Term: Able to explain why pulse checking is important during independent exercise;Long Term: Able to check pulse independently and accurately       Understanding of Exercise Prescription Yes       Intervention Provide education, explanation, and written materials on patient's individual exercise prescription       Expected Outcomes Short Term: Able to explain program exercise prescription;Long Term: Able to explain home exercise prescription to exercise independently                Exercise Goals Re-Evaluation :  Exercise  Goals Re-Evaluation     Row Name 05/27/23 [phone] 06/13/23 0726 06/28/23 0851         Exercise Goal Re-Evaluation   Exercise Goals Review Able to understand and use rate of perceived exertion (RPE) scale;Able to understand and use Dyspnea scale;Knowledge and understanding of Target Heart Rate Range (THRR);Understanding of Exercise Prescription Increase Physical Activity;Increase Strength and Stamina;Understanding of Exercise Prescription Increase Physical  Activity;Increase Strength and Stamina;Understanding of Exercise Prescription     Comments Reviewed RPE and dyspnea scale, THR and program prescription with pt today.  Pt voiced understanding and was given a copy of goals to take home. Rebbeca Campi is off to a great start in the program. He has been able to increase his speed on the treadmill to 2.1 mph, and increase to level 1.5 on the recumbent bike. We will continue to monitor his progress in the program. Rebbeca Campi is doing well in the program. He recently added 0.5% incline to his treadmill workload while maintaining a speed of 1.8 mph. He also improved to level 4 on the T4 nustep. We will continue to monitor his progress in the program.     Expected Outcomes Short: Use RPE daily to regulate intensity. Long: Follow program prescription in THR. -- Short: Continue to progressively increase treadmill workload. Long: Continue exercise to improve strength and stamina.              Discharge Exercise Prescription (Final Exercise Prescription Changes):  Exercise Prescription Changes - 06/28/23 0800       Response to Exercise   Blood Pressure (Admit) 104/76    Blood Pressure (Exercise) 124/64    Blood Pressure (Exit) 104/56    Heart Rate (Admit) 83 bpm    Heart Rate (Exercise) 115 bpm    Heart Rate (Exit) 73 bpm    Rating of Perceived Exertion (Exercise) 12    Symptoms none    Duration Continue with 30 min of aerobic exercise without signs/symptoms of physical distress.    Intensity THRR unchanged      Progression   Progression Continue to progress workloads to maintain intensity without signs/symptoms of physical distress.    Average METs 2.76      Resistance Training   Training Prescription Yes    Weight 5 lb    Reps 10-15      Interval Training   Interval Training No      Treadmill   MPH 1.8    Grade 0.5    Minutes 15    METs 2.5      NuStep   Level 4    Minutes 15    METs 3      Oxygen   Maintain Oxygen Saturation 88% or higher              Nutrition:  Target Goals: Understanding of nutrition guidelines, daily intake of sodium 1500mg , cholesterol 200mg , calories 30% from fat and 7% or less from saturated fats, daily to have 5 or more servings of fruits and vegetables.  Education: All About Nutrition: -Group instruction provided by verbal, written material, interactive activities, discussions, models, and posters to present general guidelines for heart healthy nutrition including fat, fiber, MyPlate, the role of sodium in heart healthy nutrition, utilization of the nutrition label, and utilization of this knowledge for meal planning. Follow up email sent as well. Written material given at graduation. Flowsheet Row Cardiac Rehab from 07/03/2023 in [facility] Cardiac and Pulmonary Rehab  Education need identified 05/08/23  Date 07/03/23  Educator  KG  Instruction Review Code 1- Verbalizes Understanding       Biometrics:  Pre Biometrics - 05/08/23 1048       Pre Biometrics   Height 5\' 11"  (1.803 m)    Weight 237 lb 4.8 oz (107.6 kg)    Waist Circumference 47 inches    Hip Circumference 44.5 inches    Waist to Hip Ratio 1.06 %    BMI (Calculated) 33.11    Single Leg Stand 15.7 seconds              Nutrition Therapy Plan and Nutrition Goals:  Nutrition Therapy & Goals - 05/27/23 1356       Nutrition Therapy   Diet Cardiac, Low Na    Protein (specify units) 90    Fiber 30 grams    Whole Grain Foods 3 servings    Saturated Fats 15 max. grams    Fruits and Vegetables 5 servings/day    Sodium 2 grams      Personal Nutrition Goals   Nutrition Goal Eat 15-30gProtein and 30-60gCarbs at each meal.    Personal Goal #2 Reduce saturated fat, less than 12g per day. Replace bad fats for more heart healthy fats.    Personal Goal #3 Read labels and reduce sodium intake to below 2300mg . Ideally 1500mg  per day.    Comments Patient not always drinking enough water, his kidney Dr has recommended he get ~64oz.  Agreed with Dr and recommended ~64oz of water daily. Spoke about sodium intake and goal of less than 1500mg  daily. Gave some ideas to cut back on sodium. He rarely eats breakfast, spoke with him about the importance of eating something small and nutrient dense in the morning. Reviewed Mediterranean diet handout. Educated on types of fats, sources, and how to read labels. Built out several meals and snacks with foods he likes and will eat.      Intervention Plan   Intervention Prescribe, educate and counsel regarding individualized specific dietary modifications aiming towards targeted core components such as weight, hypertension, lipid management, diabetes, heart failure and other comorbidities.;Nutrition handout(s) given to patient.    Expected Outcomes Short Term Goal: Understand basic principles of dietary content, such as calories, fat, sodium, cholesterol and nutrients.;Short Term Goal: A plan has been developed with personal nutrition goals set during dietitian appointment.;Long Term Goal: Adherence to prescribed nutrition plan.             Nutrition Assessments:  MEDIFICTS Score Key: >=70 Need to make dietary changes  40-70 Heart Healthy Diet <= 40 Therapeutic Level Cholesterol Diet  Flowsheet Row Cardiac Rehab from 05/08/2023 in [facility] Cardiac and Pulmonary Rehab  Picture Your Plate Total Score on Admission 71      Picture Your Plate Scores: <91 Unhealthy dietary pattern with much room for improvement. 41-50 Dietary pattern unlikely to meet recommendations for good health and room for improvement. 51-60 More healthful dietary pattern, with some room for improvement.  >60 Healthy dietary pattern, although there may be some specific behaviors that could be improved.    Nutrition Goals Re-Evaluation:  Nutrition Goals Re-Evaluation     Row Name 06/03/23 0932 06/14/23 0941           Goals   Current Weight 231 lb 6.4 oz (105 kg) 233 lb 8 oz (105.9 kg)      Nutrition Goal Eat  15-30gProtein and 30-60gCarbs at each meal. --      Comment -- Rebbeca Campi states that he is doing well with his  diet most of the time. He also reports learning a lot about what he is doing right and wrong when he spoke with the RD. He and his wife are also working on limiting their unhealthy snacks. He also continues to work on reducing his sodium intake and does not add salt at the table. He also pays attention to food labels.      Expected Outcome -- Short: Continue to reduce sodium intake and read food labels. Long: Continue heart healthy eating patterns discussed with RD.        Personal Goal #2 Re-Evaluation   Personal Goal #2 Reduce saturated fat, less than 12g per day. Replace bad fats for more heart healthy fats. --        Personal Goal #3 Re-Evaluation   Personal Goal #3 Read labels and reduce sodium intake to below 2300mg . Ideally 1500mg  per day. --               Nutrition Goals Discharge (Final Nutrition Goals Re-Evaluation):  Nutrition Goals Re-Evaluation - 06/14/23 0941       Goals   Current Weight 233 lb 8 oz (105.9 kg)    Comment Rebbeca Campi states that he is doing well with his diet most of the time. He also reports learning a lot about what he is doing right and wrong when he spoke with the RD. He and his wife are also working on limiting their unhealthy snacks. He also continues to work on reducing his sodium intake and does not add salt at the table. He also pays attention to food labels.    Expected Outcome Short: Continue to reduce sodium intake and read food labels. Long: Continue heart healthy eating patterns discussed with RD.             Psychosocial: Target Goals: Acknowledge presence or absence of significant depression and/or stress, maximize coping skills, provide positive support system. Participant is able to verbalize types and ability to use techniques and skills needed for reducing stress and depression.   Education: Stress, Anxiety, and Depression - Group  verbal and visual presentation to define topics covered.  Reviews how body is impacted by stress, anxiety, and depression.  Also discusses healthy ways to reduce stress and to treat/manage anxiety and depression.  Written material given at graduation.   Education: Sleep Hygiene -Provides group verbal and written instruction about how sleep can affect your health.  Define sleep hygiene, discuss sleep cycles and impact of sleep habits. Review good sleep hygiene tips.    Initial Review & Psychosocial Screening:  Initial Psych Review & Screening - 05/02/23 1342       Initial Review   Current issues with Current Stress Concerns    Source of Stress Concerns --    Comments Health issues this last year      Family Dynamics   Good Support System? Yes   wife     Barriers   Psychosocial barriers to participate in program There are no identifiable barriers or psychosocial needs.;The patient should benefit from training in stress management and relaxation.      Screening Interventions   Interventions Encouraged to exercise;Provide feedback about the scores to participant;To provide support and resources with identified psychosocial needs    Expected Outcomes Short Term goal: Utilizing psychosocial counselor, staff and physician to assist with identification of specific Stressors or current issues interfering with healing process. Setting desired goal for each stressor or current issue identified.;Long Term Goal: Stressors or current issues are controlled  or eliminated.;Short Term goal: Identification and review with participant of any Quality of Life or Depression concerns found by scoring the questionnaire.;Long Term goal: The participant improves quality of Life and PHQ9 Scores as seen by post scores and/or verbalization of changes             Quality of Life Scores:   Quality of Life - 05/08/23 1147       Quality of Life   Select Quality of Life      Quality of Life Scores    Health/Function Pre 23.47 %    Socioeconomic Pre 23 %    Psych/Spiritual Pre 23.57 %    Family Pre 22.5 %    GLOBAL Pre 23.27 %            Scores of 19 and below usually indicate a poorer quality of life in these areas.  A difference of  2-3 points is a clinically meaningful difference.  A difference of 2-3 points in the total score of the Quality of Life Index has been associated with significant improvement in overall quality of life, self-image, physical symptoms, and general health in studies assessing change in quality of life.  PHQ-9: Review Flowsheet  More data exists      05/08/2023 06/19/2022 06/14/2022 06/04/2022 01/17/2022  Depression screen PHQ 2/9  Decreased Interest 0 0 0 0 0  Down, Depressed, Hopeless 0 0 0 0 0  PHQ - 2 Score 0 0 0 0 0  Altered sleeping 0 0 0 0 0  Tired, decreased energy 1 0 0 0 0  Change in appetite 0 0 0 0 0  Feeling bad or failure about yourself  0 0 0 0 0  Trouble concentrating 0 0 0 0 0  Moving slowly or fidgety/restless 0 0 0 0 0  Suicidal thoughts 0 0 0 0 0  PHQ-9 Score 1 0 0 0 0  Difficult doing work/chores Not difficult at all Not difficult at all - Not difficult at all -   Interpretation of Total Score  Total Score Depression Severity:  1-4 = Minimal depression, 5-9 = Mild depression, 10-14 = Moderate depression, 15-19 = Moderately severe depression, 20-27 = Severe depression   Psychosocial Evaluation and Intervention:  Psychosocial Evaluation - 05/02/23 1402       Psychosocial Evaluation & Interventions   Interventions Encouraged to exercise with the program and follow exercise prescription;Stress management education;Relaxation education    Comments 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 He states he is feeling well and has started walking more. 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 He states this last year has been filled  with health problems with him and prior to last December he did not have much of a heart history. He states he takes it day by day and just pushes forward. His wife is his main support system. He is interested in the education classes and learning more of how to independently manage his health.    Expected Outcomes Short: attend cardiac rehab for education and exercise. Long; develop and maintain positive self care habits    Continue Psychosocial Services  Follow up required by staff             Psychosocial Re-Evaluation:  Psychosocial Re-Evaluation     Row Name 06/03/23 [phone] 06/14/23 [phone]  Psychosocial Re-Evaluation   Current issues with None Identified None Identified      Comments Rebbeca Campi states that the program seems to help him stay positive and manage his mental health. His wife and his fellow gun club members make up his support group. Rebbeca Campi reports no major stressors at this time. Rebbeca Campi enjoys target shooting 2-3 times a week, reading, walking the dog, and cook for stress relief. He also states that he is sleeping well since his valve replacement. He also reports having a good support system made up by his wife and friends. He believes that exercise in the program has been great for his mental state and wants to continue to exercise outside of rehab.      Expected Outcomes Short: Attend [facility] stress management education to decrease stress. Long: Maintain exercise Post [facility] to keep stress at a minimum. Short: Continue to attend rehab for mental boost. Long: Continue to maintain positive outlook.      Interventions Encouraged to attend Cardiac Rehabilitation for the exercise Encouraged to attend Cardiac Rehabilitation for the exercise      Continue Psychosocial Services  Follow up required by staff Follow up required by staff               Psychosocial Discharge (Final Psychosocial Re-Evaluation):  Psychosocial Re-Evaluation - 06/14/23 0937       Psychosocial  Re-Evaluation   Current issues with None Identified    Comments Rebbeca Campi reports no major stressors at this time. Rebbeca Campi enjoys target shooting 2-3 times a week, reading, walking the dog, and cook for stress relief. He also states that he is sleeping well since his valve replacement. He also reports having a good support system made up by his wife and friends. He believes that exercise in the program has been great for his mental state and wants to continue to exercise outside of rehab.    Expected Outcomes Short: Continue to attend rehab for mental boost. Long: Continue to maintain positive outlook.    Interventions Encouraged to attend Cardiac Rehabilitation for the exercise    Continue Psychosocial Services  Follow up required by staff             Vocational Rehabilitation: Provide vocational rehab assistance to qualifying candidates.   Vocational Rehab Evaluation & Intervention:  Vocational Rehab - 05/02/23 1339       Initial Vocational Rehab Evaluation & Intervention   Assessment shows need for Vocational Rehabilitation No             Education: Education Goals: Education classes will be provided on a variety of topics geared toward better understanding of heart health and risk factor modification. Participant will state understanding/return demonstration of topics presented as noted by education test scores.  Learning Barriers/Preferences:  Learning Barriers/Preferences - 05/02/23 1338       Learning Barriers/Preferences   Learning Barriers None    Learning Preferences None             General Cardiac Education Topics:  AED/CPR: - Group verbal and written instruction with the use of models to demonstrate the basic use of the AED with the basic ABC's of resuscitation.   Anatomy and Cardiac Procedures: - Group verbal and visual presentation and models provide information about basic cardiac anatomy and function. Reviews the testing methods done to diagnose heart  disease and the outcomes of the test results. Describes the treatment choices: Medical Management, Angioplasty, or Coronary Bypass Surgery for treating various heart conditions including Myocardial Infarction,  Angina, Valve Disease, and Cardiac Arrhythmias.  Written material given at graduation.   Medication Safety: - Group verbal and visual instruction to review commonly prescribed medications for heart and lung disease. Reviews the medication, class of the drug, and side effects. Includes the steps to properly store meds and maintain the prescription regimen.  Written material given at graduation.   Intimacy: - Group verbal instruction through game format to discuss how heart and lung disease can affect sexual intimacy. Written material given at graduation..   Know Your Numbers and Heart Failure: - Group verbal and visual instruction to discuss disease risk factors for cardiac and pulmonary disease and treatment options.  Reviews associated critical values for Overweight/Obesity, Hypertension, Cholesterol, and Diabetes.  Discusses basics of heart failure: signs/symptoms and treatments.  Introduces Heart Failure Zone chart for action plan for heart failure.  Written material given at graduation.   Infection Prevention: - Provides verbal and written material to individual with discussion of infection control including proper hand washing and proper equipment cleaning during exercise session. Flowsheet Row Cardiac Rehab from 07/03/2023 in [facility] Cardiac and Pulmonary Rehab  Date 05/08/23  Educator NT  Instruction Review Code 1- Verbalizes Understanding       Falls Prevention: - Provides verbal and written material to individual with discussion of falls prevention and safety. Flowsheet Row Cardiac Rehab from 07/03/2023 in [facility] Cardiac and Pulmonary Rehab  Date 05/08/23  Educator NT  Instruction Review Code 1- Verbalizes Understanding       Other: -Provides group and verbal instruction  on various topics (see comments)   Knowledge Questionnaire Score:  Knowledge Questionnaire Score - 05/08/23 1144       Knowledge Questionnaire Score   Pre Score 25/26             Core Components/Risk Factors/Patient Goals at Admission:  Personal Goals and Risk Factors at Admission - 05/02/23 1338       Core Components/Risk Factors/Patient Goals on Admission   Hypertension Yes    Intervention Provide education on lifestyle modifcations including regular physical activity/exercise, weight management, moderate sodium restriction and increased consumption of fresh fruit, vegetables, and low fat dairy, alcohol moderation, and smoking cessation.;Monitor prescription use compliance.    Expected Outcomes Short Term: Continued assessment and intervention until BP is < 140/52mm HG in hypertensive participants. < 130/21mm HG in hypertensive participants with diabetes, heart failure or chronic kidney disease.;Long Term: Maintenance of blood pressure at goal levels.    Lipids Yes    Intervention Provide education and support for participant on nutrition & aerobic/resistive exercise along with prescribed medications to achieve LDL 70mg , HDL >40mg .    Expected Outcomes Short Term: Participant states understanding of desired cholesterol values and is compliant with medications prescribed. Participant is following exercise prescription and nutrition guidelines.;Long Term: Cholesterol controlled with medications as prescribed, with individualized exercise RX and with personalized nutrition plan. Value goals: LDL < 70mg , HDL > 40 mg.             Education:Diabetes - Individual verbal and written instruction to review signs/symptoms of diabetes, desired ranges of glucose level fasting, after meals and with exercise. Acknowledge that pre and post exercise glucose checks will be done for 3 sessions at entry of program.   Core Components/Risk Factors/Patient Goals Review:   Goals and Risk Factor  Review     Row Name 06/03/23 0933 06/14/23 0945           Core Components/Risk Factors/Patient Goals Review   Personal Goals  Review Hypertension Hypertension;Weight Management/Obesity      Review Rebbeca Campi says that he has a blood pressure cuff at home but states that he is not currently taking his pressure at home. He is aware that he should be taking it and has been advised by his doctors. Rebbeca Campi continues to work towards weight loss. He weighed in today at 233 lb 8 oz and has a long term goal of getting down to 200 lbs. Rebbeca Campi also states that he has been dealing with high blood pressure for most of his life and is working on reducing sodium intake and not adding salt at the table. He does own a BP cuff at home, but has not been checking it routinely since it has been monitored in rehab. He plans to get back to checking his BP multiple times a week.      Expected Outcomes Short: Start taking blood pressure at home. Long: Monitor blood pressure in order to manage hypertension. Short: Continue to work towards weight goal through diet and exercise. Long: Continue to manage lifestyle risk factors.               Core Components/Risk Factors/Patient Goals at Discharge (Final Review):   Goals and Risk Factor Review - 06/14/23 0945       Core Components/Risk Factors/Patient Goals Review   Personal Goals Review Hypertension;Weight Management/Obesity    Review Rebbeca Campi continues to work towards weight loss. He weighed in today at 233 lb 8 oz and has a long term goal of getting down to 200 lbs. Rebbeca Campi also states that he has been dealing with high blood pressure for most of his life and is working on reducing sodium intake and not adding salt at the table. He does own a BP cuff at home, but has not been checking it routinely since it has been monitored in rehab. He plans to get back to checking his BP multiple times a week.    Expected Outcomes Short: Continue to work towards weight goal through diet and exercise.  Long: Continue to manage lifestyle risk factors.             ITP Comments:  ITP Comments     Row Name 05/02/23 1347 05/08/23 1047 05/27/23 0928 06/05/23 1139     ITP Comments Initial phone call completed. Diagnosis can be found in [facility] 10/15. EP Orientation scheduled for Wednesday 11/20 at 9:30. Completed and gym orientation. Initial ITP created and sent for review to Dr. Firman Hughes, Medical Director. First full day of exercise!  Patient was oriented to gym and equipment including functions, settings, policies, and procedures.  Patient's individual exercise prescription and treatment plan were reviewed.  All starting workloads were established based on the results of the 6 minute walk test done at initial orientation visit.  The plan for exercise progression was also introduced and progression will be customized based on patient's performance and goals. 30 Day review completed. Medical Director ITP review done, changes made as directed, and signed approval by Medical Director.    new to program             Comments: 30 day ITP.

## 2023-07-05 ENCOUNTER — Encounter: Payer: Medicare Other | Admitting: *Deleted

## 2023-07-05 DIAGNOSIS — Z952 Presence of prosthetic heart valve: Secondary | ICD-10-CM | POA: Diagnosis not present

## 2023-07-05 NOTE — Progress Notes (Signed)
Daily Session Note  Patient Details  Name: Alan Campbell [name]: [id number] Date of Birth: 01-Mar-1951 Referring Provider:   Flowsheet Row Cardiac Rehab from 05/08/2023 in [facility] Cardiac and Pulmonary Rehab  Referring Provider Dr. Dorothyann Peng, MD       Encounter Date: 07/05/2023  Check In:  Session Check In - 07/05/23 0933       Check-In   Supervising physician immediately available to respond to emergencies See telemetry face sheet for immediately available ER MD    Location [facility]-Cardiac & Pulmonary Rehab    Staff Present Bess Kinds RN,BSN;Joseph [facility] [place], [place], Exercise Physiologist    Virtual Visit No    Medication changes reported     No    Fall or balance concerns reported    No    Warm-up and Cool-down Performed on first and last piece of equipment    Resistance Training Performed Yes    VAD Patient? No    PAD/SET Patient? No      Pain Assessment   Currently in Pain? No/denies                Social History   Tobacco Use  Smoking Status Former   Current packs/day: 0.00   Average packs/day: 0.5 packs/day for 5.0 years (2.5 ttl pk-yrs)   Types: Cigarettes   Start date: 10/02/1965   Quit date: 10/03/1970   Years since quitting: 52.7  Smokeless Tobacco Never  Tobacco Comments   Quit 1972    Goals Met:  Independence with exercise equipment Exercise tolerated well No report of concerns or symptoms today Strength training completed today  Goals Unmet:  Not Applicable  Comments: Pt able to follow exercise prescription today without complaint.  Will continue to monitor for progression.    Dr. Bethann Punches is Medical Director for [facility] Cardiac Rehabilitation.  Dr. Vida Rigger is Medical Director for [facility] Pulmonary Rehabilitation.

## 2023-07-08 ENCOUNTER — Encounter: Payer: Medicare Other | Admitting: *Deleted

## 2023-07-08 DIAGNOSIS — Z952 Presence of prosthetic heart valve: Secondary | ICD-10-CM

## 2023-07-08 NOTE — Progress Notes (Signed)
Daily Session Note  Patient Details  Name: Alan Campbell [name]: [id number] Date of Birth: 1951/03/10 Referring Provider:   Flowsheet Row Cardiac Rehab from 05/08/2023 in [facility] Cardiac and Pulmonary Rehab  Referring Provider Dr. Dorothyann Peng, MD       Encounter Date: 07/08/2023  Check In:  Session Check In - 07/08/23 0921       Check-In   Supervising physician immediately available to respond to emergencies See telemetry face sheet for immediately available ER MD    Location [facility]-Cardiac & Pulmonary Rehab    Staff Present Maxon Conetta BS, Exercise Physiologist;Kelly Cloretta Ned, ACSM CEP, Exercise Physiologist;Margaret Best, MS, Exercise Physiologist;Romy Ipock Jewel Baize RN,BSN    Virtual Visit No    Medication changes reported     No    Fall or balance concerns reported    No    Warm-up and Cool-down Performed on first and last piece of equipment    Resistance Training Performed Yes    VAD Patient? No    PAD/SET Patient? No      Pain Assessment   Currently in Pain? No/denies                Social History   Tobacco Use  Smoking Status Former   Current packs/day: 0.00   Average packs/day: 0.5 packs/day for 5.0 years (2.5 ttl pk-yrs)   Types: Cigarettes   Start date: 10/02/1965   Quit date: 10/03/1970   Years since quitting: 52.7  Smokeless Tobacco Never  Tobacco Comments   Quit 1972    Goals Met:  Independence with exercise equipment Exercise tolerated well No report of concerns or symptoms today Strength training completed today  Goals Unmet:  Not Applicable  Comments: Pt able to follow exercise prescription today without complaint.  Will continue to monitor for progression.    Dr. Bethann Punches is Medical Director for [facility] Cardiac Rehabilitation.  Dr. Vida Rigger is Medical Director for [facility] Pulmonary Rehabilitation.

## 2023-07-10 ENCOUNTER — Encounter: Payer: Medicare Other | Admitting: *Deleted

## 2023-07-10 DIAGNOSIS — Z952 Presence of prosthetic heart valve: Secondary | ICD-10-CM | POA: Diagnosis not present

## 2023-07-10 NOTE — Progress Notes (Signed)
Daily Session Note  Patient Details  Name: Alan Campbell [name]: [id number] Date of Birth: 12-22-1950 Referring Provider:   Flowsheet Row Cardiac Rehab from 05/08/2023 in [facility] Cardiac and Pulmonary Rehab  Referring Provider Dr. Dorothyann Peng, MD       Encounter Date: 07/10/2023  Check In:  Session Check In - 07/10/23 1003       Check-In   Supervising physician immediately available to respond to emergencies See telemetry face sheet for immediately available ER MD    Location [facility]-Cardiac & Pulmonary Rehab    Staff Present Cora Collum, RN, BSN, CCRP;Noah Tickle, BS, Exercise Physiologist;Maxon Conetta BS, Exercise Physiologist    Virtual Visit No    Medication changes reported     No    Fall or balance concerns reported    No    Warm-up and Cool-down Performed on first and last piece of equipment    Resistance Training Performed Yes    VAD Patient? No    PAD/SET Patient? No      Pain Assessment   Currently in Pain? No/denies                Social History   Tobacco Use  Smoking Status Former   Current packs/day: 0.00   Average packs/day: 0.5 packs/day for 5.0 years (2.5 ttl pk-yrs)   Types: Cigarettes   Start date: 10/02/1965   Quit date: 10/03/1970   Years since quitting: 52.8  Smokeless Tobacco Never  Tobacco Comments   Quit 1972    Goals Met:  Independence with exercise equipment Exercise tolerated well No report of concerns or symptoms today  Goals Unmet:  Not Applicable  Comments: Pt able to follow exercise prescription today without complaint.  Will continue to monitor for progression.    Dr. Bethann Punches is Medical Director for [facility] Cardiac Rehabilitation.  Dr. Vida Rigger is Medical Director for [facility] Pulmonary Rehabilitation.

## 2023-07-12 ENCOUNTER — Encounter: Payer: Medicare Other | Admitting: *Deleted

## 2023-07-12 DIAGNOSIS — Z952 Presence of prosthetic heart valve: Secondary | ICD-10-CM | POA: Diagnosis not present

## 2023-07-12 NOTE — Progress Notes (Signed)
Daily Session Note  Patient Details  Name: Alan Campbell [name]: [id number] Date of Birth: 05-07-1951 Referring Provider:   Flowsheet Row Cardiac Rehab from 05/08/2023 in [facility] Cardiac and Pulmonary Rehab  Referring Provider Dr. Dorothyann Peng, MD       Encounter Date: 07/12/2023  Check In:  Session Check In - 07/12/23 0931       Check-In   Supervising physician immediately available to respond to emergencies See telemetry face sheet for immediately available ER MD    Location [facility]-Cardiac & Pulmonary Rehab    Staff Present Cora Collum, RN, BSN, CCRP;Joseph Hood RCP,RRT,BSRT;Noah Tickle, [place], Exercise Physiologist    Virtual Visit No    Medication changes reported     No    Fall or balance concerns reported    No    Warm-up and Cool-down Performed on first and last piece of equipment    Resistance Training Performed Yes    VAD Patient? No    PAD/SET Patient? No      Pain Assessment   Currently in Pain? No/denies                Social History   Tobacco Use  Smoking Status Former   Current packs/day: 0.00   Average packs/day: 0.5 packs/day for 5.0 years (2.5 ttl pk-yrs)   Types: Cigarettes   Start date: 10/02/1965   Quit date: 10/03/1970   Years since quitting: 52.8  Smokeless Tobacco Never  Tobacco Comments   Quit 1972    Goals Met:  Independence with exercise equipment Exercise tolerated well No report of concerns or symptoms today  Goals Unmet:  Not Applicable  Comments: Pt able to follow exercise prescription today without complaint.  Will continue to monitor for progression.    Dr. Bethann Punches is Medical Director for [facility] Cardiac Rehabilitation.  Dr. Vida Rigger is Medical Director for [facility] Pulmonary Rehabilitation.

## 2023-07-15 ENCOUNTER — Encounter: Payer: Medicare Other | Admitting: *Deleted

## 2023-07-15 DIAGNOSIS — Z952 Presence of prosthetic heart valve: Secondary | ICD-10-CM

## 2023-07-15 NOTE — Progress Notes (Signed)
Daily Session Note  Patient Details  Name: Alan Campbell [name]: [id number] Date of Birth: 17-Sep-1950 Referring Provider:   Flowsheet Row Cardiac Rehab from 05/08/2023 in [facility] Cardiac and Pulmonary Rehab  Referring Provider Dr. Dorothyann Peng, MD       Encounter Date: 07/15/2023  Check In:  Session Check In - 07/15/23 0943       Check-In   Supervising physician immediately available to respond to emergencies See telemetry face sheet for immediately available ER MD    Location [facility]-Cardiac & Pulmonary Rehab    Staff Present Cora Collum, RN, BSN, CCRP;Margaret Best, MS, Exercise Physiologist;Maxon Conetta BS, Exercise Physiologist;Kelly Cloretta Ned, ACSM CEP, Exercise Physiologist    Virtual Visit No    Medication changes reported     No    Fall or balance concerns reported    No    Warm-up and Cool-down Performed on first and last piece of equipment    Resistance Training Performed Yes    VAD Patient? No    PAD/SET Patient? No      Pain Assessment   Currently in Pain? No/denies                Social History   Tobacco Use  Smoking Status Former   Current packs/day: 0.00   Average packs/day: 0.5 packs/day for 5.0 years (2.5 ttl pk-yrs)   Types: Cigarettes   Start date: 10/02/1965   Quit date: 10/03/1970   Years since quitting: 52.8  Smokeless Tobacco Never  Tobacco Comments   Quit 1972    Goals Met:  Independence with exercise equipment Exercise tolerated well No report of concerns or symptoms today  Goals Unmet:  Not Applicable  Comments: Pt able to follow exercise prescription today without complaint.  Will continue to monitor for progression.   Reviewed home exercise with pt today.  Pt plans to walk at home and look into joining either the [employer] or [employer] for exercise.  Reviewed THR, pulse, RPE, sign and symptoms, pulse oximetery and when to call 911 or MD.  Also discussed weather considerations and indoor options.  Pt voiced  understanding.   Dr. Bethann Punches is Medical Director for [facility] Cardiac Rehabilitation.  Dr. Vida Rigger is Medical Director for [facility] Pulmonary Rehabilitation.

## 2023-07-17 DIAGNOSIS — Z952 Presence of prosthetic heart valve: Secondary | ICD-10-CM

## 2023-07-17 NOTE — Progress Notes (Signed)
Daily Session Note  Patient Details  Name: JOSEH SJOGREN [name]: [id number] Date of Birth: 01-Dec-1950 Referring Provider:   Flowsheet Row Cardiac Rehab from 05/08/2023 in [facility] Cardiac and Pulmonary Rehab  Referring Provider Dr. Dorothyann Peng, MD       Encounter Date: 07/17/2023  Check In:  Session Check In - 07/17/23 0936       Check-In   Supervising physician immediately available to respond to emergencies See telemetry face sheet for immediately available ER MD    Location [facility]-Cardiac & Pulmonary Rehab    Staff Present Kelton Pillar RN,BSN,MPA;Margaret Best, MS, Exercise Physiologist;Maxon Conetta BS, Exercise Physiologist;Joseph Reino Kent RCP,RRT,BSRT    Virtual Visit No    Medication changes reported     No    Fall or balance concerns reported    No    Warm-up and Cool-down Performed on first and last piece of equipment    Resistance Training Performed Yes    VAD Patient? No    PAD/SET Patient? No      Pain Assessment   Currently in Pain? No/denies                Social History   Tobacco Use  Smoking Status Former   Current packs/day: 0.00   Average packs/day: 0.5 packs/day for 5.0 years (2.5 ttl pk-yrs)   Types: Cigarettes   Start date: 10/02/1965   Quit date: 10/03/1970   Years since quitting: 52.8  Smokeless Tobacco Never  Tobacco Comments   Quit 1972    Goals Met:  Independence with exercise equipment Exercise tolerated well No report of concerns or symptoms today Strength training completed today  Goals Unmet:  Not Applicable  Comments: Pt able to follow exercise prescription today without complaint.  Will continue to monitor for progression.    Dr. Bethann Punches is Medical Director for [facility] Cardiac Rehabilitation.  Dr. Vida Rigger is Medical Director for [facility] Pulmonary Rehabilitation.

## 2023-07-19 ENCOUNTER — Encounter: Payer: Medicare Other | Admitting: *Deleted

## 2023-07-19 DIAGNOSIS — Z952 Presence of prosthetic heart valve: Secondary | ICD-10-CM

## 2023-07-19 NOTE — Progress Notes (Signed)
Daily Session Note  Patient Details  Name: Alan Campbell [name]: [id number] Date of Birth: [birth year] Referring Provider:   Flowsheet Row Cardiac Rehab from 05/08/2023 in [facility] Cardiac and Pulmonary Rehab  Referring Provider Dr. Dorothyann Peng, MD       Encounter Date: 07/19/2023  Check In:  Session Check In - 07/19/23 0935       Check-In   Supervising physician immediately available to respond to emergencies See telemetry face sheet for immediately available ER MD    Location [facility]-Cardiac & Pulmonary Rehab    Staff Present Ronette Deter, BS, Exercise Physiologist;Ennis Heavner, RN, BSN, CCRP;Joseph Hood RCP,RRT,BSRT    Virtual Visit No    Medication changes reported     No    Fall or balance concerns reported    No    Warm-up and Cool-down Performed on first and last piece of equipment    Resistance Training Performed Yes    VAD Patient? No    PAD/SET Patient? No      Pain Assessment   Currently in Pain? No/denies                Social History   Tobacco Use  Smoking Status Former   Current packs/day: 0.00   Average packs/day: 0.5 packs/day for 5.0 years (2.5 ttl pk-yrs)   Types: Cigarettes   Start date: 10/02/1965   Quit date: 10/03/1970   Years since quitting: 52.8  Smokeless Tobacco Never  Tobacco Comments   Quit 1972    Goals Met:  Independence with exercise equipment Exercise tolerated well No report of concerns or symptoms today  Goals Unmet:  Not Applicable  Comments: Pt able to follow exercise prescription today without complaint.  Will continue to monitor for progression.    Dr. Bethann Punches is Medical Director for [facility] Cardiac Rehabilitation.  Dr. Vida Rigger is Medical Director for [facility] Pulmonary Rehabilitation.

## 2023-07-22 ENCOUNTER — Encounter: Payer: Medicare Other | Attending: Internal Medicine

## 2023-07-22 DIAGNOSIS — Z952 Presence of prosthetic heart valve: Secondary | ICD-10-CM | POA: Insufficient documentation

## 2023-07-22 DIAGNOSIS — Z48812 Encounter for surgical aftercare following surgery on the circulatory system: Secondary | ICD-10-CM | POA: Insufficient documentation

## 2023-07-22 NOTE — Progress Notes (Signed)
Daily Session Note  Patient Details  Name: Alan Campbell [name]: [id number] Date of Birth: Oct 01, 1950 Referring Provider:   Flowsheet Row Cardiac Rehab from 05/08/2023 in [facility] Cardiac and Pulmonary Rehab  Referring Provider Dr. Dorothyann Peng, MD       Encounter Date: 07/22/2023  Check In:  Session Check In - 07/22/23 0918       Check-In   Supervising physician immediately available to respond to emergencies See telemetry face sheet for immediately available ER MD    Location [facility]-Cardiac & Pulmonary Rehab    Staff Present Kelton Pillar RN,BSN,MPA;Margaret Best, MS, Exercise Physiologist;Maxon Conetta BS, Exercise Physiologist;Vannah Nadal Cloretta Ned, ACSM CEP, Exercise Physiologist    Virtual Visit No    Medication changes reported     No    Fall or balance concerns reported    No    Warm-up and Cool-down Performed on first and last piece of equipment    Resistance Training Performed Yes    VAD Patient? No    PAD/SET Patient? No      Pain Assessment   Currently in Pain? No/denies                Social History   Tobacco Use  Smoking Status Former   Current packs/day: 0.00   Average packs/day: 0.5 packs/day for 5.0 years (2.5 ttl pk-yrs)   Types: Cigarettes   Start date: 10/02/1965   Quit date: 10/03/1970   Years since quitting: 52.8  Smokeless Tobacco Never  Tobacco Comments   Quit 1972    Goals Met:  Independence with exercise equipment Exercise tolerated well No report of concerns or symptoms today Strength training completed today  Goals Unmet:  Not Applicable  Comments: Pt able to follow exercise prescription today without complaint.  Will continue to monitor for progression.    Dr. Bethann Punches is Medical Director for [facility] Cardiac Rehabilitation.  Dr. Vida Rigger is Medical Director for [facility] Pulmonary Rehabilitation.

## 2023-07-24 ENCOUNTER — Encounter: Payer: Medicare Other | Admitting: *Deleted

## 2023-07-24 DIAGNOSIS — Z48812 Encounter for surgical aftercare following surgery on the circulatory system: Secondary | ICD-10-CM | POA: Diagnosis not present

## 2023-07-24 DIAGNOSIS — Z952 Presence of prosthetic heart valve: Secondary | ICD-10-CM

## 2023-07-24 NOTE — Progress Notes (Signed)
 Daily Session Note  Patient Details  Name: Alan Campbell [name]: [id number] Date of Birth: 10/15/1950 Referring Provider:   Flowsheet Row Cardiac Rehab from 05/08/2023 in [facility] Cardiac and Pulmonary Rehab  Referring Provider Dr. Cara Lovelace, MD       Encounter Date: 07/24/2023  Check In:  Session Check In - 07/24/23 0952       Check-In   Supervising physician immediately available to respond to emergencies See telemetry face sheet for immediately available ER MD    Location [facility]-Cardiac & Pulmonary Rehab    Staff Present Rollene Paterson, MS, Exercise Physiologist;Maxon Burnell HECKLE, Exercise Physiologist;Joseph Hood RCP,RRT,BSRT;Meredith Craven RN,BSN;Marie Chow, RN, BSN, CCRP    Virtual Visit No    Medication changes reported     No    Fall or balance concerns reported    No    Warm-up and Cool-down Performed on first and last piece of equipment    Resistance Training Performed Yes    VAD Patient? No    PAD/SET Patient? No      Pain Assessment   Currently in Pain? No/denies                Social History   Tobacco Use  Smoking Status Former   Current packs/day: 0.00   Average packs/day: 0.5 packs/day for 5.0 years (2.5 ttl pk-yrs)   Types: Cigarettes   Start date: 10/02/1965   Quit date: 10/03/1970   Years since quitting: 52.8  Smokeless Tobacco Never  Tobacco Comments   Quit 1972    Goals Met:  Independence with exercise equipment Exercise tolerated well No report of concerns or symptoms today  Goals Unmet:  Not Applicable  Comments: Pt able to follow exercise prescription today without complaint.  Will continue to monitor for progression.    Dr. Oneil Pinal is Medical Director for [facility] Cardiac Rehabilitation.  Dr. Fuad Aleskerov is Medical Director for [facility] Pulmonary Rehabilitation.

## 2023-07-25 ENCOUNTER — Ambulatory Visit
Admission: RE | Admit: 2023-07-25 | Discharge: 2023-07-25 | Disposition: A | Discharge status: Home / Self Care | Payer: Medicare Other | Source: Ambulatory Visit | Attending: Interventional Radiology | Admitting: Interventional Radiology

## 2023-07-25 DIAGNOSIS — N2889 Other specified disorders of kidney and ureter: Secondary | ICD-10-CM

## 2023-07-25 HISTORY — PX: IR RADIOLOGIST EVAL & MGMT: IMG5224

## 2023-07-25 NOTE — Progress Notes (Signed)
 Chief Complaint: Patient was consulted remotely today (TeleHealth) for right renal AML and oncocytic neoplasm at the request of Alan Loveland K.    Referring Physician(s): Dolora Ridgely K  History of Present Illness: Alan Campbell is a 73 y.o. 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  He underwent percutaneous microwave ablation of the upper pole AML with concurrent biopsy and percutaneous microwave ablation of the lower pole enhancing renal mass on 11/29/2021.  He was discharged home the following day in good condition.  His recovery was uneventful.  He had minimal pain, no issues with dysuria, hematuria or other symptoms.  He has returned to work and is at full activities and has been for several weeks.  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  Of course, renal cell carcinomas are known to have areas of oncocytic features and the false negative biopsy rate is nearly 50%.  However, these biopsy results are good news.  Since our last clinic visit, Alan Campbell has undergone a TAVR procedure and is doing very well.  He has no active clinical complaints at this time.    CT ABD 07/03/22 1. Upper and lower pole right renal ablation sites, without acute complication. 2. The lower pole right renal ablation site encompasses the entire presumed renal cell carcinoma, which is decreased in size and demonstrates no residual postcontrast enhancement. 3. No evidence of abdominal metastatic disease.  MRI 05/24/23 *Redemonstration of post-ablation right renal lesions without contrast enhancement, suggesting no viable tumor. *No new suspicious renal mass seen.   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 Moderate to severe atypia, edge involved.   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    Past Surgical History:  Procedure Laterality Date   BACK SURGERY     COLONOSCOPY WITH PROPOFOL  N/A 08/14/2019   Procedure: COLONOSCOPY WITH PROPOFOL ;  Surgeon: Unk Corinn Skiff, MD;  Location: [facility] ENDOSCOPY;  Service: Gastroenterology;  Laterality: N/A;  PRIORITY 4   EYE SURGERY  2016   right cataract removal   IR RADIOLOGIST EVAL & MGMT  10/31/2021   IR RADIOLOGIST EVAL & MGMT  01/09/2022   IR RADIOLOGIST EVAL & MGMT  07/12/2022   IR RADIOLOGIST EVAL & MGMT  07/25/2023   LAMINECTOMY  1985   L-5   PACEMAKER LEADLESS INSERTION N/A 06/06/2022   Procedure: PACEMAKER LEADLESS INSERTION;  Surgeon: Ammon Blunt, MD;  Location: [facility] INVASIVE CV LAB;  Service: Cardiovascular;  Laterality: N/A;  following AP first Micra   PERICARDIOCENTESIS N/A 06/06/2022   Procedure: PERICARDIOCENTESIS;  Surgeon: Ammon Blunt, MD;  Location: [facility] INVASIVE CV LAB;  Service: Cardiovascular;  Laterality: N/A;   RADIOLOGY WITH ANESTHESIA N/A 11/29/2021   Procedure: CT MICROWAVE ABLATION;  Surgeon: Karalee Wilkie POUR, MD;  Location: [facility] ORS;  Service: Radiology;  Laterality: N/A;   SPINE SURGERY  1985    Allergies: Patient has no known allergies.  Medications: Prior to Admission medications   Medication Sig Start Date End Date Taking? Authorizing Provider  Acetaminophen  500 MG capsule Take by mouth.    [provider]  albuterol  (VENTOLIN  HFA) 108 (90 Base) MCG/ACT inhaler Inhale 2 puffs into the lungs every 6  (six) hours as needed for wheezing or shortness of breath. 06/19/22   Mecum, Erin E, PA-C  allopurinol (ZYLOPRIM) 100 MG tablet Take 50 mg by mouth daily. 03/15/23 03/14/24  [provider]  amitriptyline (ELAVIL) 25 MG tablet Take 1 tablet by mouth at bedtime. Patient not taking: Reported on 05/02/2023 06/13/22 06/13/23  [provider]  aspirin  EC 81 MG tablet Take 81 mg by mouth daily. Swallow whole.    [provider]  Cholecalciferol (VITAMIN D ) 2000 units CAPS Take 1 capsule (2,000 Units total) by mouth daily. 07/07/15   Sowles, Krichna, MD  Cinnamon 500 MG TABS Take 1,000 mg by mouth daily.    [provider]  clonazePAM (KLONOPIN) 1 MG tablet Take 1 mg by mouth at bedtime as needed. 06/13/22   [provider]  CRANBERRY PO Take 4,200 mg by mouth daily.    [provider]  empagliflozin (JARDIANCE) 10 MG TABS tablet Take 10 mg by mouth daily. 12/19/22 12/19/23  [provider]  febuxostat  (ULORIC ) 40 MG tablet Take 1 tablet (40 mg total) by mouth daily. Patient not taking: Reported on 05/02/2023 01/17/22   Sowles, Krichna, MD  fish oil-omega-3 fatty acids 1000 MG capsule Take 1,000 mg by mouth daily.    [provider]  levothyroxine  (SYNTHROID ) 137 MCG tablet Take 137 mcg by mouth daily before breakfast. 09/27/22 09/27/23  [provider]  metoprolol  succinate (TOPROL -XL) 25 MG 24 hr tablet Take 12.5 mg by mouth daily. 03/15/23 03/14/24  [provider]  Multiple Vitamin (MULTI-VITAMIN) tablet Take 1 tablet by mouth daily.    [provider]  Multiple Vitamins-Minerals (CENTRUM SILVER 50+MEN) TABS Take 1 tablet by mouth daily.    [provider]  potassium chloride  (KLOR-CON  M) 10 MEQ tablet Take 10 mEq by mouth daily as needed (As needed with Torsemide). 01/07/23 01/07/24  [provider]  predniSONE  (DELTASONE ) 20 MG tablet Take 40 mg by mouth as needed (as needed for gout flare ups).  10/23/22   [provider]  rosuvastatin  (CRESTOR ) 40 MG tablet Take 1 tablet (40 mg total) by mouth daily. 05/22/22   Sowles, Krichna, MD  sacubitril-valsartan  (ENTRESTO) 24-26 MG Take 1 tablet by mouth 2 (two) times daily. 11/28/22   [provider]  spironolactone (ALDACTONE) 25 MG tablet Take 25 mg by mouth daily. 11/07/22 11/07/23  [provider]  Testosterone  20.25 MG/ACT (1.62%) GEL Apply 2 each topically daily. 2 pumps daily 11/30/20   Sowles, Krichna, MD  torsemide (DEMADEX) 20 MG tablet Take by mouth.    [provider]  valsartan  (DIOVAN ) 320 MG tablet TAKE ONE TABLET BY MOUTH DAILY Patient not taking: Reported on 05/02/2023 04/03/22   Sowles, Krichna, MD  verapamil  (CALAN -SR) 240 MG CR tablet TAKE ONE TABLET BY MOUTH EVERY NIGHT AT BEDTIME Patient not taking: Reported on 05/02/2023 04/03/22   Sowles, Krichna, MD     Family History  Problem Relation Age of Onset   Heart disease Father    Early death Father  Hypothyroidism Sister    Thyroid disease Sister    Hypothyroidism Sister    Migraines Sister    Prostate cancer Neg Hx    Bladder Cancer Neg Hx    Kidney cancer Neg Hx     Social History   Socioeconomic History   Marital status: Married    Spouse name: donna   Number of children: 0   Years of education: Not on file   Highest education level: Not on file  Occupational History   Occupation: armed forces training and education officer   Tobacco Use   Smoking status: Former    Current packs/day: 0.00    Average packs/day: 0.5 packs/day for 5.0 years (2.5 ttl pk-yrs)    Types: Cigarettes    Start date: 10/02/1965    Quit date: 10/03/1970    Years since quitting: 52.8   Smokeless tobacco: Never   Tobacco comments:    Quit 1972  Vaping Use   Vaping status: Never Used  Substance and Sexual Activity   Alcohol use: Yes    Alcohol/week: 4.0 standard drinks of alcohol    Types: 4 Standard drinks or equivalent per week   Drug use: No   Sexual activity: Yes     Partners: Female  Other Topics Concern   Not on file  Social History Narrative   Married since 1974, no children but they helped raise [place] ( wife's niece), also helped raise their God-daughter Estefana   He is planning on retiring January 2021   Social Drivers of Corporate Investment Banker Strain: Low Risk  (04/29/2023)   Received from [facility]   Overall Financial Resource Strain (CARDIA)    Difficulty of Paying Living Expenses: Not hard at all  Food Insecurity: No Food Insecurity (04/29/2023)   [facility] from [facility]   Hunger Vital Sign    Worried About Running Out of Food in the Last Year: Never true    Ran Out of Food in the Last Year: Never true  Transportation Needs: No Transportation Needs (04/29/2023)   Received from [facility] - Transportation    In the past 12 months, has lack of transportation kept you from medical appointments or from getting medications?: No    Lack of Transportation (Non-Medical): No  Physical Activity: Inactive (07/27/2021)   Exercise Vital Sign    Days of Exercise per Week: 0 days    Minutes of Exercise per Session: 0 min  Stress: No Stress Concern Present (07/27/2021)   [employer] of Occupational Health - Occupational Stress Questionnaire    Feeling of Stress : Not at all  Social Connections: Socially Integrated (07/27/2021)   Social Connection and Isolation Panel [NHANES]    Frequency of Communication with Friends and Family: More than three times a week    Frequency of Social Gatherings with Friends and Family: Three times a week    Attends Religious Services: More than 4 times per year    Active Member of Clubs or Organizations: Yes    Attends Engineer, Structural: More than 4 times per year    Marital Status: Married    ECOG Status: 0 - Asymptomatic  Review of Systems  Review of Systems: A 12 point ROS discussed and pertinent positives are indicated in  the HPI above.  All other systems are negative.   Physical Exam No direct physical exam was performed (except for noted visual exam findings with Video Visits).    Vital Signs: There  were no vitals taken for this visit.  Imaging: IR Radiologist Eval & Mgmt Result Date: 07/25/2023 EXAM: ESTABLISHED PATIENT OFFICE VISIT CHIEF COMPLAINT: SEE NOTE IN EPIC HISTORY OF PRESENT ILLNESS: SEE NOTE IN EPIC REVIEW OF SYSTEMS: SEE NOTE IN EPIC PHYSICAL EXAMINATION: SEE NOTE IN EPIC ASSESSMENT AND PLAN: SEE NOTE IN EPIC Electronically Signed   By: Wilkie Lent M.D.   On: 07/25/2023 08:42    Labs:  CBC: No results for input(s): WBC, HGB, HCT, PLT in the last 8760 hours.  COAGS: No results for input(s): INR, APTT in the last 8760 hours.  BMP: Recent Labs    02/07/23 1040  CREATININE 2.70*    LIVER FUNCTION TESTS: No results for input(s): BILITOT, AST, ALT, ALKPHOS, PROT, ALBUMIN in the last 8760 hours.  TUMOR MARKERS: No results for input(s): AFPTM, CEA, CA199, CHROMGRNA in the last 8760 hours.  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  Surveillance imaging demonstrates treated lesions without evidence of recurrence or new lesion.  We will continue with routine surveillance imaging.   1.)  Follow-up Renal protocol MRI abdomen with contrast in 1 year with accompanying clinic visit.     Electronically Signed: Wilkie MARLA Lent 07/25/2023, 8:53 AM   I spent a total of  15 Minutes in remote  clinical consultation, greater than 50% of which was counseling/coordinating care for right renal neoplasms.    Visit type: Audio only (telephone). Audio (no video) only due to patient preference. Alternative for in-person  consultation at [facility], [address] [place], [place], [place]. This visit type was conducted due to national recommendations for restrictions regarding the COVID-19 Pandemic (e.g. social distancing).  This format is felt to be most appropriate for this patient at this time.  All issues noted in this document were discussed and addressed.

## 2023-07-26 ENCOUNTER — Encounter: Payer: Medicare Other | Admitting: *Deleted

## 2023-07-26 DIAGNOSIS — Z952 Presence of prosthetic heart valve: Secondary | ICD-10-CM

## 2023-07-26 DIAGNOSIS — Z48812 Encounter for surgical aftercare following surgery on the circulatory system: Secondary | ICD-10-CM | POA: Diagnosis not present

## 2023-07-26 NOTE — Progress Notes (Signed)
 Daily Session Note  Patient Details  Name: Alan Campbell [name]: [id number] Date of Birth: 1951-02-05 Referring Provider:   Flowsheet Row Cardiac Rehab from 05/08/2023 in [facility] Cardiac and Pulmonary Rehab  Referring Provider Dr. Cara Lovelace, MD       Encounter Date: 07/26/2023  Check In:  Session Check In - 07/26/23 0941       Check-In   Supervising physician immediately available to respond to emergencies See telemetry face sheet for immediately available ER MD    Location [facility]-Cardiac & Pulmonary Rehab    Staff Present Othel Durand, RN, BSN, CCRP;Joseph Hood RCP,RRT,BSRT;Noah Tickle, [place], Exercise Physiologist    Virtual Visit No    Medication changes reported     No    Fall or balance concerns reported    No    Warm-up and Cool-down Performed on first and last piece of equipment    Resistance Training Performed Yes    VAD Patient? No    PAD/SET Patient? No      Pain Assessment   Currently in Pain? No/denies                Social History   Tobacco Use  Smoking Status Former   Current packs/day: 0.00   Average packs/day: 0.5 packs/day for 5.0 years (2.5 ttl pk-yrs)   Types: Cigarettes   Start date: 10/02/1965   Quit date: 10/03/1970   Years since quitting: 52.8  Smokeless Tobacco Never  Tobacco Comments   Quit 1972    Goals Met:  Independence with exercise equipment Exercise tolerated well No report of concerns or symptoms today  Goals Unmet:  Not Applicable  Comments: Pt able to follow exercise prescription today without complaint.  Will continue to monitor for progression.    Dr. Oneil Pinal is Medical Director for [facility] Cardiac Rehabilitation.  Dr. Fuad Aleskerov is Medical Director for [facility] Pulmonary Rehabilitation.

## 2023-07-29 ENCOUNTER — Encounter: Payer: Medicare Other | Admitting: *Deleted

## 2023-07-29 VITALS — Ht 71.0 in | Wt 238.2 lb

## 2023-07-29 DIAGNOSIS — Z48812 Encounter for surgical aftercare following surgery on the circulatory system: Secondary | ICD-10-CM | POA: Diagnosis not present

## 2023-07-29 DIAGNOSIS — Z952 Presence of prosthetic heart valve: Secondary | ICD-10-CM

## 2023-07-29 NOTE — Patient Instructions (Addendum)
 Discharge Patient Instructions  Patient Details  Name: Alan Campbell [name]: [id number] Date of Birth: 24-Nov-1950 Referring Provider:  Eartha Gold, MD   Number of Visits: 36  Reason for Discharge:  Patient reached a stable level of exercise. Patient independent in their exercise. Patient has met program and personal goals.  Smoking History:  Social History   Tobacco Use  Smoking Status Former   Current packs/day: 0.00   Average packs/day: 0.5 packs/day for 5.0 years (2.5 ttl pk-yrs)   Types: Cigarettes   Start date: 10/02/1965   Quit date: 10/03/1970   Years since quitting: 52.8  Smokeless Tobacco Never  Tobacco Comments   Quit 1972    Diagnosis:  S/P TAVR (transcatheter aortic valve replacement)  Initial Exercise Prescription:  Initial Exercise Prescription - 05/08/23 1100       Date of Initial Exercise RX and Referring Provider   Date 05/08/23    Referring Provider Dr. Burney Carter, MD      Oxygen   Maintain Oxygen Saturation 88% or higher      Treadmill   MPH 1.6    Grade 0    Minutes 15    METs 2.23      Recumbant Bike   Level 1    RPM 50    Watts 15    Minutes 15    METs 1.7      NuStep   Level 2    SPM 80    Minutes 15    METs 1.7      Track   Laps 18    Minutes 15    METs 1.98      Prescription Details   Frequency (times per week) 3    Duration Progress to 30 minutes of continuous aerobic without signs/symptoms of physical distress      Intensity   THRR 40-80% of Max Heartrate 102-132    Ratings of Perceived Exertion 11-13    Perceived Dyspnea 0-4      Progression   Progression Continue to progress workloads to maintain intensity without signs/symptoms of physical distress.      Resistance Training   Training Prescription Yes    Weight 5 lb    Reps 10-15             Discharge Exercise Prescription (Final Exercise Prescription Changes):  Exercise Prescription Changes - 07/25/23 0700       Response to Exercise    Blood Pressure (Admit) 110/70    Blood Pressure (Exit) 124/62    Heart Rate (Admit) 80 bpm    Heart Rate (Exercise) 109 bpm    Heart Rate (Exit) 52 bpm    Rating of Perceived Exertion (Exercise) 14    Symptoms none    Duration Continue with 30 min of aerobic exercise without signs/symptoms of physical distress.    Intensity THRR unchanged      Progression   Progression Continue to progress workloads to maintain intensity without signs/symptoms of physical distress.    Average METs 3.04      Resistance Training   Training Prescription Yes    Weight 5 lb    Reps 10-15      Interval Training   Interval Training No      Treadmill   MPH 1.5    Grade 2    Minutes 15    METs 2.56      NuStep   Level 7    Minutes 30    METs 3.6  Biostep-RELP   Level 2    Minutes 15    METs 2.4      Track   Laps 23    Minutes 15    METs 2.25      Home Exercise Plan   Plans to continue exercise at Home (comment)   walking and looking into joining a fitness center.   Frequency Add 2 additional days to program exercise sessions.    Initial Home Exercises Provided 07/15/23      Oxygen   Maintain Oxygen Saturation 88% or higher             Functional Capacity:  6 Minute Walk     Row Name 05/08/23 1047 07/29/23 0937       6 Minute Walk   Phase Initial Discharge    Distance 905 feet 1280 feet    Distance % Change -- 41.44 %    Distance Feet Change -- 375 ft    Walk Time 6 minutes 6 minutes    # of Rest Breaks 0 0    MPH 1.71 2.42    METS 1.7 2.34    RPE 10 13    Perceived Dyspnea  0 0    VO2 Peak 5.96 8.19    Symptoms No No    Resting HR 73 bpm 74 bpm    Resting BP 104/62 100/62    Resting Oxygen Saturation  98 % 98 %    Exercise Oxygen Saturation  during 6 min walk 100 % 95 %    Max Ex. HR 95 bpm 100 bpm    Max Ex. BP 132/70 124/62    2 Minute Post BP 110/68 112/62            Nutrition & Weight - Outcomes:  Pre Biometrics - 05/08/23 1048       Pre  Biometrics   Height 5\' 11"  (1.803 m)    Weight 237 lb 4.8 oz (107.6 kg)    Waist Circumference 47 inches    Hip Circumference 44.5 inches    Waist to Hip Ratio 1.06 %    BMI (Calculated) 33.11    Single Leg Stand 15.7 seconds             Post Biometrics - 07/29/23 0942        Post  Biometrics   Height 5\' 11"  (1.803 m)    Weight 238 lb 3.2 oz (108 kg)    Waist Circumference 48.1 inches    Hip Circumference 45 inches    Waist to Hip Ratio 1.07 %    BMI (Calculated) 33.24    Single Leg Stand 3.6 seconds             Nutrition:  Nutrition Therapy & Goals - 05/27/23 1356       Nutrition Therapy   Diet Cardiac, Low Na    Protein (specify units) 90    Fiber 30 grams    Whole Grain Foods 3 servings    Saturated Fats 15 max. grams    Fruits and Vegetables 5 servings/day    Sodium 2 grams      Personal Nutrition Goals   Nutrition Goal Eat 15-30gProtein and 30-60gCarbs at each meal.    Personal Goal #2 Reduce saturated fat, less than 12g per day. Replace bad fats for more heart healthy fats.    Personal Goal #3 Read labels and reduce sodium intake to below 2300mg . Ideally 1500mg  per day.    Comments Patient not always  drinking enough water, his kidney Dr has recommended he get ~64oz. Agreed with Dr and recommended ~64oz of water daily. Spoke about sodium intake and goal of less than 1500mg  daily. Gave some ideas to cut back on sodium. He rarely eats breakfast, spoke with him about the importance of eating something small and nutrient dense in the morning. Reviewed Mediterranean diet handout. Educated on types of fats, sources, and how to read labels. Built out several meals and snacks with foods he likes and will eat.      Intervention Plan   Intervention Prescribe, educate and counsel regarding individualized specific dietary modifications aiming towards targeted core components such as weight, hypertension, lipid management, diabetes, heart failure and other  comorbidities.;Nutrition handout(s) given to patient.    Expected Outcomes Short Term Goal: Understand basic principles of dietary content, such as calories, fat, sodium, cholesterol and nutrients.;Short Term Goal: A plan has been developed with personal nutrition goals set during dietitian appointment.;Long Term Goal: Adherence to prescribed nutrition plan.

## 2023-07-29 NOTE — Progress Notes (Signed)
 Daily Session Note  Patient Details  Name: Alan Campbell [name]: [id number] Date of Birth: [birth year] Referring Provider:   Flowsheet Row Cardiac Rehab from 05/08/2023 in [facility] Cardiac and Pulmonary Rehab  Referring Provider Dr. Burney Carter, MD       Encounter Date: 07/29/2023  Check In:  Session Check In - 07/29/23 1015       Check-In   Supervising physician immediately available to respond to emergencies See telemetry face sheet for immediately available ER MD    Location [facility]-Cardiac & Pulmonary Rehab    Staff Present Lyell Samuel, MS, Exercise Physiologist;Maxon Beauford Bounds, Exercise Physiologist;Kelly Dawne Euler, ACSM CEP, Exercise Physiologist;Shalandria Elsbernd, RN, BSN, CCRP    Virtual Visit No    Medication changes reported     No    Fall or balance concerns reported    No    Warm-up and Cool-down Performed on first and last piece of equipment    Resistance Training Performed Yes    VAD Patient? No    PAD/SET Patient? No      Pain Assessment   Currently in Pain? No/denies              6 Minute Walk     Row Name 05/08/23 1047 07/29/23 0937       6 Minute Walk   Phase Initial Discharge    Distance 905 feet 1280 feet    Distance % Change -- 41.44 %    Distance Feet Change -- 375 ft    Walk Time 6 minutes 6 minutes    # of Rest Breaks 0 0    MPH 1.71 2.42    METS 1.7 2.34    RPE 10 13    Perceived Dyspnea  0 0    VO2 Peak 5.96 8.19    Symptoms No No    Resting HR 73 bpm 74 bpm    Resting BP 104/62 100/62    Resting Oxygen Saturation  98 % 98 %    Exercise Oxygen Saturation  during 6 min walk 100 % 95 %    Max Ex. HR 95 bpm 100 bpm    Max Ex. BP 132/70 124/62    2 Minute Post BP 110/68 112/62                Social History   Tobacco Use  Smoking Status Former   Current packs/day: 0.00   Average packs/day: 0.5 packs/day for 5.0 years (2.5 ttl pk-yrs)   Types: Cigarettes   Start date: 10/02/1965   Quit date: 10/03/1970   Years since quitting:  52.8  Smokeless Tobacco Never  Tobacco Comments   Quit 1972    Goals Met:  Independence with exercise equipment Exercise tolerated well No report of concerns or symptoms today  Goals Unmet:  Not Applicable  Comments: Pt able to follow exercise prescription today without complaint.  Will continue to monitor for progression.    Dr. Firman Hughes is Medical Director for [facility] Cardiac Rehabilitation.  Dr. Fuad Aleskerov is Medical Director for [facility] Pulmonary Rehabilitation.

## 2023-07-31 ENCOUNTER — Encounter: Payer: Medicare Other | Admitting: *Deleted

## 2023-07-31 DIAGNOSIS — Z952 Presence of prosthetic heart valve: Secondary | ICD-10-CM

## 2023-07-31 DIAGNOSIS — Z48812 Encounter for surgical aftercare following surgery on the circulatory system: Secondary | ICD-10-CM | POA: Diagnosis not present

## 2023-07-31 NOTE — Progress Notes (Signed)
Daily Session Note  Patient Details  Name: AVIER JECH [name]: [id number] Date of Birth: 1950/07/24 Referring Provider:   Flowsheet Row Cardiac Rehab from 05/08/2023 in [facility] Cardiac and Pulmonary Rehab  Referring Provider Dr. Dorothyann Peng, MD       Encounter Date: 07/31/2023  Check In:  Session Check In - 07/31/23 1322       Check-In   Supervising physician immediately available to respond to emergencies See telemetry face sheet for immediately available ER MD    Location [facility]-Cardiac & Pulmonary Rehab    Staff Present Cora Collum, RN, BSN, CCRP;Margaret Best, MS, Exercise Physiologist;Maxon Conetta BS, Exercise Physiologist;Joseph Reino Kent RCP,RRT,BSRT;Meredith Craven RN,BSN    Virtual Visit No    Medication changes reported     No    Fall or balance concerns reported    No    Warm-up and Cool-down Performed on first and last piece of equipment    Resistance Training Performed Yes    VAD Patient? No    PAD/SET Patient? No      Pain Assessment   Currently in Pain? No/denies                Social History   Tobacco Use  Smoking Status Former   Current packs/day: 0.00   Average packs/day: 0.5 packs/day for 5.0 years (2.5 ttl pk-yrs)   Types: Cigarettes   Start date: 10/02/1965   Quit date: 10/03/1970   Years since quitting: 52.8  Smokeless Tobacco Never  Tobacco Comments   Quit 1972   Med change  stopped potassium  Demedex down to 20 mg Goals Met:  Independence with exercise equipment Exercise tolerated well No report of concerns or symptoms today  Goals Unmet:  Not Applicable  Comments: Pt able to follow exercise prescription today without complaint.  Will continue to monitor for progression.    Dr. Bethann Punches is Medical Director for [facility] Cardiac Rehabilitation.  Dr. Vida Rigger is Medical Director for [facility] Pulmonary Rehabilitation.

## 2023-07-31 NOTE — Progress Notes (Signed)
Cardiac Individual Treatment Plan  Patient Details  Name: Alan Campbell [name]: [id number] Date of Birth: 01-30-1951 Referring Provider:   Flowsheet Row Cardiac Rehab from 05/08/2023 in [facility] Cardiac and Pulmonary Rehab  Referring Provider Dr. Dorothyann Peng, MD       Initial Encounter Date:  Flowsheet Row Cardiac Rehab from 05/08/2023 in [facility] Cardiac and Pulmonary Rehab  Date 05/08/23       Visit Diagnosis: S/P TAVR (transcatheter aortic valve replacement)  Patient's Home Medications on Admission:  Current Outpatient Medications:    Acetaminophen 500 MG capsule, Take by mouth., Disp: , Rfl:    albuterol (VENTOLIN HFA) 108 (90 Base) MCG/ACT inhaler, Inhale 2 puffs into the lungs every 6 (six) hours as needed for wheezing or shortness of breath., Disp: 6.7 g, Rfl: 0   allopurinol (ZYLOPRIM) 100 MG tablet, Take 50 mg by mouth daily., Disp: , Rfl:    amitriptyline (ELAVIL) 25 MG tablet, Take 1 tablet by mouth at bedtime. (Patient not taking: Reported on 05/02/2023), Disp: , Rfl:    aspirin EC 81 MG tablet, Take 81 mg by mouth daily. Swallow whole., Disp: , Rfl:    Cholecalciferol (VITAMIN D) 2000 units CAPS, Take 1 capsule (2,000 Units total) by mouth daily., Disp: 30 capsule, Rfl: 0   Cinnamon 500 MG TABS, Take 1,000 mg by mouth daily., Disp: , Rfl:    clonazePAM (KLONOPIN) 1 MG tablet, Take 1 mg by mouth at bedtime as needed., Disp: , Rfl:    CRANBERRY PO, Take 4,200 mg by mouth daily., Disp: , Rfl:    empagliflozin (JARDIANCE) 10 MG TABS tablet, Take 10 mg by mouth daily., Disp: , Rfl:    febuxostat (ULORIC) 40 MG tablet, Take 1 tablet (40 mg total) by mouth daily. (Patient not taking: Reported on 05/02/2023), Disp: 90 tablet, Rfl: 1   fish oil-omega-3 fatty acids 1000 MG capsule, Take 1,000 mg by mouth daily., Disp: , Rfl:    levothyroxine (SYNTHROID) 137 MCG tablet, Take 137 mcg by mouth daily before breakfast., Disp: , Rfl:    metoprolol succinate (TOPROL-XL) 25 MG 24 hr tablet,  Take 12.5 mg by mouth daily., Disp: , Rfl:    Multiple Vitamin (MULTI-VITAMIN) tablet, Take 1 tablet by mouth daily., Disp: , Rfl:    Multiple Vitamins-Minerals (CENTRUM SILVER 50+MEN) TABS, Take 1 tablet by mouth daily., Disp: , Rfl:    potassium chloride (KLOR-CON M) 10 MEQ tablet, Take 10 mEq by mouth daily as needed (As needed with Torsemide)., Disp: , Rfl:    predniSONE (DELTASONE) 20 MG tablet, Take 40 mg by mouth as needed (as needed for gout flare ups)., Disp: , Rfl:    rosuvastatin (CRESTOR) 40 MG tablet, Take 1 tablet (40 mg total) by mouth daily., Disp: 90 tablet, Rfl: 1   sacubitril-valsartan (ENTRESTO) 24-26 MG, Take 1 tablet by mouth 2 (two) times daily., Disp: , Rfl:    spironolactone (ALDACTONE) 25 MG tablet, Take 25 mg by mouth daily., Disp: , Rfl:    Testosterone 20.25 MG/ACT (1.62%) GEL, Apply 2 each topically daily. 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 Moderate to severe atypia, edge involved.   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    Tobacco Use: Social History   Tobacco Use  Smoking Status  Former   Current packs/day: 0.00   Average packs/day: 0.5 packs/day for 5.0 years (2.5 ttl pk-yrs)   Types: Cigarettes   Start date: 10/02/1965   Quit date: 10/03/1970   Years since quitting: 52.8  Smokeless Tobacco Never  Tobacco Comments   Quit 1972    Labs: Review Flowsheet  More data exists      Latest Ref Rng & Units 06/02/2019 02/25/2020 03/02/2021 11/23/2021 01/17/2022  Labs for ITP Cardiac and Pulmonary Rehab  Cholestrol <200 mg/dL 409  811  914  - 782   LDL (calc) mg/dL (calc) 91  956  88  - 213   HDL-C > OR = 40 mg/dL 48  52  41  - 56   Trlycerides <150 mg/dL 086  578  469  - 629   Hemoglobin A1c 4.8 - 5.6 % 4.6  4.9  - 4.5  -     Exercise Target Goals: Exercise Program Goal: Individual exercise prescription set using results from initial 6 min walk test and THRR while considering  patient's activity barriers and safety.   Exercise Prescription Goal: Initial exercise prescription builds to 30-45 minutes a day of aerobic activity, 2-3 days per week.  Home exercise guidelines will be given to patient during program as part of exercise prescription that the participant will acknowledge.   Education: Aerobic Exercise: - Group verbal and visual presentation on the components of exercise prescription. Introduces F.I.T.T principle from ACSM for exercise prescriptions.  Reviews F.I.T.T. principles of aerobic exercise including progression. Written material given at graduation.   Education: Resistance Exercise: - Group verbal and visual presentation on the components of exercise prescription. Introduces F.I.T.T principle from ACSM for exercise prescriptions  Reviews F.I.T.T. principles of resistance exercise including progression. Written material given at graduation. Flowsheet Row Cardiac Rehab from 07/10/2023 in [facility] Cardiac and Pulmonary Rehab  Date 06/17/23  Educator NT  Instruction Review Code 1- Verbalizes Understanding        Education: Exercise & Equipment Safety: -  Individual verbal instruction and demonstration of equipment use and safety with use of the equipment. Flowsheet Row Cardiac Rehab from 07/10/2023 in [facility] Cardiac and Pulmonary Rehab  Date 05/08/23  Educator NT  Instruction Review Code 1- Verbalizes Understanding       Education: Exercise Physiology & General Exercise Guidelines: - Group verbal and written instruction with models to review the exercise physiology of the cardiovascular system and associated critical values. Provides general exercise guidelines with specific guidelines to those with heart or lung disease.  Flowsheet Row Cardiac Rehab from 07/10/2023 in [facility] Cardiac and Pulmonary Rehab  Date 06/10/23  Educator [facility]  Instruction Review Code 1- [employer]  Education: Flexibility, Balance, Mind/Body Relaxation: - Group verbal and visual presentation with interactive activity on the components of exercise prescription. Introduces F.I.T.T principle from ACSM for exercise prescriptions. Reviews F.I.T.T. principles of flexibility and balance exercise training including progression. Also discusses the mind body connection.  Reviews various relaxation techniques to help reduce and manage stress (i.e. Deep breathing, progressive muscle relaxation, and visualization). Balance handout provided to take home. Written material given at graduation. Flowsheet Row Cardiac Rehab from 07/10/2023 in [facility] Cardiac and Pulmonary Rehab  Date 06/17/23  Educator NT  Instruction Review Code 1- Verbalizes Understanding       Activity Barriers & Risk Stratification:  Activity Barriers & Cardiac Risk Stratification - 05/08/23 1049       Activity Barriers & Cardiac Risk Stratification   Activity Barriers Joint Problems;Balance Concerns;Arthritis;Back Problems;Other (comment)    Comments gout    Cardiac Risk Stratification Moderate             6 Minute Walk:  6 Minute Walk     Row Name 05/08/23 1047 07/29/23 0937       6  Minute Walk   Phase Initial Discharge    Distance 905 feet 1280 feet    Distance % Change -- 41.44 %    Distance Feet Change -- 375 ft    Walk Time 6 minutes 6 minutes    # of Rest Breaks 0 0    MPH 1.71 2.42    METS 1.7 2.34    RPE 10 13    Perceived Dyspnea  0 0    VO2 Peak 5.96 8.19    Symptoms No No    Resting HR 73 bpm 74 bpm    Resting BP 104/62 100/62    Resting Oxygen Saturation  98 % 98 %    Exercise Oxygen Saturation  during 6 min walk 100 % 95 %    Max Ex. HR 95 bpm 100 bpm    Max Ex. BP 132/70 124/62    2 Minute Post BP 110/68 112/62             Oxygen Initial Assessment:   Oxygen Re-Evaluation:   Oxygen Discharge (Final Oxygen Re-Evaluation):   Initial Exercise Prescription:  Initial Exercise Prescription - 05/08/23 1100       Date of Initial Exercise RX and Referring Provider   Date 05/08/23    Referring Provider Dr. Dorothyann Peng, MD      Oxygen   Maintain Oxygen Saturation 88% or higher      Treadmill   MPH 1.6    Grade 0    Minutes 15    METs 2.23      Recumbant Bike   Level 1    RPM 50    Watts 15    Minutes 15    METs 1.7      NuStep   Level 2    SPM 80    Minutes 15    METs 1.7      Track   Laps 18    Minutes 15    METs 1.98      Prescription Details   Frequency (times per week) 3    Duration Progress to 30 minutes of continuous aerobic without signs/symptoms of physical distress      Intensity   THRR 40-80% of Max Heartrate 102-132    Ratings of Perceived Exertion 11-13    Perceived Dyspnea 0-4      Progression   Progression Continue to progress  workloads to maintain intensity without signs/symptoms of physical distress.      Resistance Training   Training Prescription Yes    Weight 5 lb    Reps 10-15             Perform Capillary Blood Glucose checks as needed.  Exercise Prescription Changes:   Exercise Prescription Changes     Row Name 06/13/23 0700 06/28/23 0800 07/11/23 1000 07/15/23 1000  07/25/23 0700     Response to Exercise   Blood Pressure (Admit) 128/62 104/76 112/60 -- 110/70   Blood Pressure (Exercise) 142/60 124/64 -- -- --   Blood Pressure (Exit) 108/62 104/56 102/60 -- 124/62   Heart Rate (Admit) 85 bpm 83 bpm 71 bpm -- 80 bpm   Heart Rate (Exercise) 121 bpm 115 bpm 130 bpm -- 109 bpm   Heart Rate (Exit) 52 bpm 73 bpm 54 bpm -- 52 bpm   Rating of Perceived Exertion (Exercise) 13 12 13  -- 14   Perceived Dyspnea (Exercise) 0 -- -- -- --   Symptoms none none none -- none   Comments first 2 weeks of exercise -- -- -- --   Duration Progress to 30 minutes of  aerobic without signs/symptoms of physical distress Continue with 30 min of aerobic exercise without signs/symptoms of physical distress. Continue with 30 min of aerobic exercise without signs/symptoms of physical distress. Continue with 30 min of aerobic exercise without signs/symptoms of physical distress. Continue with 30 min of aerobic exercise without signs/symptoms of physical distress.   Intensity THRR unchanged THRR unchanged THRR unchanged THRR unchanged THRR unchanged     Progression   Progression Continue to progress workloads to maintain intensity without signs/symptoms of physical distress. Continue to progress workloads to maintain intensity without signs/symptoms of physical distress. Continue to progress workloads to maintain intensity without signs/symptoms of physical distress. Continue to progress workloads to maintain intensity without signs/symptoms of physical distress. Continue to progress workloads to maintain intensity without signs/symptoms of physical distress.   Average METs 2.65 2.76 2.86 2.86 3.04     Resistance Training   Training Prescription Yes Yes Yes Yes Yes   Weight 5 lb 5 lb 5 lb 5 lb 5 lb   Reps 10-15 10-15 10-15 10-15 10-15     Interval Training   Interval Training No No No No No     Treadmill   MPH 2.1 1.8 2 2  1.5   Grade 0 0.5 3 3 2    Minutes 15 15 15 15 15    METs 2.61  2.5 3.36 3.36 2.56     Recumbant Bike   Level 1.5 -- -- -- --   Watts 15 -- -- -- --   Minutes 15 -- -- -- --   METs 2.45 -- -- -- --     NuStep   Level 3 4 7 7 7    Minutes 15 15 15 15 30    METs 3.4 3 3.6 3.6 3.6     Biostep-RELP   Level 2 -- -- -- 2   Minutes 15 -- -- -- 15   METs 2.2 -- -- -- 2.4     Track   Laps 18 -- 16 16 23    Minutes 15 -- 15 15 15    METs 1.98 -- 1.87 1.87 2.25     Home Exercise Plan   Plans to continue exercise at -- -- -- Home (comment)  walking and looking into joining a fitness center. Home (comment)  walking and looking into  joining a fitness center.   Frequency -- -- -- Add 2 additional days to program exercise sessions. Add 2 additional days to program exercise sessions.   Initial Home Exercises Provided -- -- -- 07/15/23 07/15/23     Oxygen   Maintain Oxygen Saturation 88% or higher 88% or higher 88% or higher 88% or higher 88% or higher            Exercise Comments:   Exercise Comments     Row Name 05/27/23 3086           Exercise Comments First full day of exercise!  Patient was oriented to gym and equipment including functions, settings, policies, and procedures.  Patient's individual exercise prescription and treatment plan were reviewed.  All starting workloads were established based on the results of the 6 minute walk test done at initial orientation visit.  The plan for exercise progression was also introduced and progression will be customized based on patient's performance and goals.                Exercise Goals and Review:   Exercise Goals     Row Name 05/08/23 1049             Exercise Goals   Increase Physical Activity Yes       Intervention Provide advice, education, support and counseling about physical activity/exercise needs.;Develop an individualized exercise prescription for aerobic and resistive training based on initial evaluation findings, risk stratification, comorbidities and participant's  personal goals.       Expected Outcomes Short Term: Attend rehab on a regular basis to increase amount of physical activity.;Long Term: Exercising regularly at least 3-5 days a week.;Long Term: Add in home exercise to make exercise part of routine and to increase amount of physical activity.       Increase Strength and Stamina Yes       Intervention Develop an individualized exercise prescription for aerobic and resistive training based on initial evaluation findings, risk stratification, comorbidities and participant's personal goals.;Provide advice, education, support and counseling about physical activity/exercise needs.       Expected Outcomes Short Term: Increase workloads from initial exercise prescription for resistance, speed, and METs.;Short Term: Perform resistance training exercises routinely during rehab and add in resistance training at home;Long Term: Improve cardiorespiratory fitness, muscular endurance and strength as measured by increased METs and functional capacity ( )       Able to understand and use rate of perceived exertion (RPE) scale Yes       Intervention Provide education and explanation on how to use RPE scale       Expected Outcomes Short Term: Able to use RPE daily in rehab to express subjective intensity level;Long Term:  Able to use RPE to guide intensity level when exercising independently       Able to understand and use Dyspnea scale Yes       Intervention Provide education and explanation on how to use Dyspnea scale       Expected Outcomes Long Term: Able to use Dyspnea scale to guide intensity level when exercising independently;Short Term: Able to use Dyspnea scale daily in rehab to express subjective sense of shortness of breath during exertion       Knowledge and understanding of Target Heart Rate Range (THRR) Yes       Intervention Provide education and explanation of THRR including how the numbers were predicted and where they are located for reference  Expected Outcomes Short Term: Able to state/look up THRR;Long Term: Able to use THRR to govern intensity when exercising independently;Short Term: Able to use daily as guideline for intensity in rehab       Able to check pulse independently Yes       Intervention Provide education and demonstration on how to check pulse in carotid and radial arteries.;Review the importance of being able to check your own pulse for safety during independent exercise       Expected Outcomes Short Term: Able to explain why pulse checking is important during independent exercise;Long Term: Able to check pulse independently and accurately       Understanding of Exercise Prescription Yes       Intervention Provide education, explanation, and written materials on patient's individual exercise prescription       Expected Outcomes Short Term: Able to explain program exercise prescription;Long Term: Able to explain home exercise prescription to exercise independently                Exercise Goals Re-Evaluation :  Exercise Goals Re-Evaluation     Row Name 05/27/23 6295 06/13/23 0726 06/28/23 0851 07/11/23 1045 07/15/23 1004     Exercise Goal Re-Evaluation   Exercise Goals Review Able to understand and use rate of perceived exertion (RPE) scale;Able to understand and use Dyspnea scale;Knowledge and understanding of Target Heart Rate Range (THRR);Understanding of Exercise Prescription Increase Physical Activity;Increase Strength and Stamina;Understanding of Exercise Prescription Increase Physical Activity;Increase Strength and Stamina;Understanding of Exercise Prescription Increase Physical Activity;Increase Strength and Stamina;Understanding of Exercise Prescription Increase Physical Activity;Able to understand and use rate of perceived exertion (RPE) scale;Knowledge and understanding of Target Heart Rate Range (THRR);Understanding of Exercise Prescription;Increase Strength and Stamina;Able to understand and use Dyspnea  scale;Able to check pulse independently   Comments Reviewed RPE and dyspnea scale, THR and program prescription with pt today.  Pt voiced understanding and was given a copy of goals to take home. Alan Campbell is off to a great start in the program. He has been able to increase his speed on the treadmill to 2.1 mph, and increase to level 1.5 on the recumbent bike. We will continue to monitor his progress in the program. Alan Campbell is doing well in the program. He recently added 0.5% incline to his treadmill workload while maintaining a speed of 1.8 mph. He also improved to level 4 on the T4 nustep. We will continue to monitor his progress in the program. Alan Campbell continues to do well in the program. He recently increased his treadmill workload to a speed of 2 mph with an incline of 3%. He also improved to level 7 on the T4 nustep. We will encourage him to use other aerobic machines to diversify his exercises. We will continue to monitor his progress in the program. Reviewed home exercise with pt today.  Pt plans to walk at home and look into joining either the [employer] or [employer] for exercise.  Reviewed THR, pulse, RPE, sign and symptoms, pulse oximetery and when to call 911 or MD.  Also discussed weather considerations and indoor options.  Pt voiced understanding.   Expected Outcomes Short: Use RPE daily to regulate intensity. Long: Follow program prescription in THR. -- Short: Continue to progressively increase treadmill workload. Long: Continue exercise to improve strength and stamina. Short: Begin to use other aerobic machines in rehab. Long: Continue exercise to improve strength and stamina. Short: look into joining a fitness center. Add 1-2 days a week of exercise at home  or in fitness center on off days of cardiac rehab. Long: maintain independent exercise routine upon graduation from cardiac rehab.    Row Name 07/25/23 0750             Exercise Goal Re-Evaluation   Exercise Goals Review Increase Physical  Activity;Increase Strength and Stamina;Understanding of Exercise Prescription       Comments Alan Campbell is doing well in rehab. He recently increased his laps walked on the track to 23 laps. He did see a decrease in his treadmill workload to a speed of 1.5 mph and a 2% incline. He has stayed consistent on the T4 nustep at level 7 and the biostep at level 2. He also is due for his post and will look to improve on it. We will continue to monitor his progress.       Expected Outcomes Short: Improve on post . Long: Continue exercise to improve strength and stamina.                Discharge Exercise Prescription (Final Exercise Prescription Changes):  Exercise Prescription Changes - 07/25/23 0700       Response to Exercise   Blood Pressure (Admit) 110/70    Blood Pressure (Exit) 124/62    Heart Rate (Admit) 80 bpm    Heart Rate (Exercise) 109 bpm    Heart Rate (Exit) 52 bpm    Rating of Perceived Exertion (Exercise) 14    Symptoms none    Duration Continue with 30 min of aerobic exercise without signs/symptoms of physical distress.    Intensity THRR unchanged      Progression   Progression Continue to progress workloads to maintain intensity without signs/symptoms of physical distress.    Average METs 3.04      Resistance Training   Training Prescription Yes    Weight 5 lb    Reps 10-15      Interval Training   Interval Training No      Treadmill   MPH 1.5    Grade 2    Minutes 15    METs 2.56      NuStep   Level 7    Minutes 30    METs 3.6      Biostep-RELP   Level 2    Minutes 15    METs 2.4      Track   Laps 23    Minutes 15    METs 2.25      Home Exercise Plan   Plans to continue exercise at Home (comment)   walking and looking into joining a fitness center.   Frequency Add 2 additional days to program exercise sessions.    Initial Home Exercises Provided 07/15/23      Oxygen   Maintain Oxygen Saturation 88% or higher             Nutrition:   Target Goals: Understanding of nutrition guidelines, daily intake of sodium 1500mg , cholesterol 200mg , calories 30% from fat and 7% or less from saturated fats, daily to have 5 or more servings of fruits and vegetables.  Education: All About Nutrition: -Group instruction provided by verbal, written material, interactive activities, discussions, models, and posters to present general guidelines for heart healthy nutrition including fat, fiber, MyPlate, the role of sodium in heart healthy nutrition, utilization of the nutrition label, and utilization of this knowledge for meal planning. Follow up email sent as well. Written material given at graduation. Flowsheet Row Cardiac Rehab from 07/10/2023 in [facility] Cardiac  and Pulmonary Rehab  Education need identified 05/08/23  Date 07/10/23  Educator KG  Instruction Review Code 1- Verbalizes Understanding       Biometrics:  Pre Biometrics - 05/08/23 1048       Pre Biometrics   Height 5\' 11"  (1.803 m)    Weight 237 lb 4.8 oz (107.6 kg)    Waist Circumference 47 inches    Hip Circumference 44.5 inches    Waist to Hip Ratio 1.06 %    BMI (Calculated) 33.11    Single Leg Stand 15.7 seconds             Post Biometrics - 07/29/23 0942        Post  Biometrics   Height 5\' 11"  (1.803 m)    Weight 238 lb 3.2 oz (108 kg)    Waist Circumference 48.1 inches    Hip Circumference 45 inches    Waist to Hip Ratio 1.07 %    BMI (Calculated) 33.24    Single Leg Stand 3.6 seconds             Nutrition Therapy Plan and Nutrition Goals:  Nutrition Therapy & Goals - 05/27/23 1356       Nutrition Therapy   Diet Cardiac, Low Na    Protein (specify units) 90    Fiber 30 grams    Whole Grain Foods 3 servings    Saturated Fats 15 max. grams    Fruits and Vegetables 5 servings/day    Sodium 2 grams      Personal Nutrition Goals   Nutrition Goal Eat 15-30gProtein and 30-60gCarbs at each meal.    Personal Goal #2 Reduce saturated fat, less  than 12g per day. Replace bad fats for more heart healthy fats.    Personal Goal #3 Read labels and reduce sodium intake to below 2300mg . Ideally 1500mg  per day.    Comments Patient not always drinking enough water, his kidney Dr has recommended he get ~64oz. Agreed with Dr and recommended ~64oz of water daily. Spoke about sodium intake and goal of less than 1500mg  daily. Gave some ideas to cut back on sodium. He rarely eats breakfast, spoke with him about the importance of eating something small and nutrient dense in the morning. Reviewed Mediterranean diet handout. Educated on types of fats, sources, and how to read labels. Built out several meals and snacks with foods he likes and will eat.      Intervention Plan   Intervention Prescribe, educate and counsel regarding individualized specific dietary modifications aiming towards targeted core components such as weight, hypertension, lipid management, diabetes, heart failure and other comorbidities.;Nutrition handout(s) given to patient.    Expected Outcomes Short Term Goal: Understand basic principles of dietary content, such as calories, fat, sodium, cholesterol and nutrients.;Short Term Goal: A plan has been developed with personal nutrition goals set during dietitian appointment.;Long Term Goal: Adherence to prescribed nutrition plan.             Nutrition Assessments:  MEDIFICTS Score Key: >=70 Need to make dietary changes  40-70 Heart Healthy Diet <= 40 Therapeutic Level Cholesterol Diet  Flowsheet Row Cardiac Rehab from 05/08/2023 in [facility] Cardiac and Pulmonary Rehab  Picture Your Plate Total Score on Admission 71      Picture Your Plate Scores: <16 Unhealthy dietary pattern with much room for improvement. 41-50 Dietary pattern unlikely to meet recommendations for good health and room for improvement. 51-60 More healthful dietary pattern, with some room for improvement.  >60  Healthy dietary pattern, although there may be some  specific behaviors that could be improved.    Nutrition Goals Re-Evaluation:  Nutrition Goals Re-Evaluation     Row Name 06/03/23 0932 06/14/23 0941 07/24/23 0948         Goals   Current Weight 231 lb 6.4 oz (105 kg) 233 lb 8 oz (105.9 kg) 238 lb (108 kg)     Nutrition Goal Eat 15-30gProtein and 30-60gCarbs at each meal. -- Cut back on Carbs     Comment -- Alan Campbell states that he is doing well with his diet most of the time. He also reports learning a lot about what he is doing right and wrong when he spoke with the RD. He and his wife are also working on limiting their unhealthy snacks. He also continues to work on reducing his sodium intake and does not add salt at the table. He also pays attention to food labels. Alan Campbell has been doing well at eating more fruit and potatoes. His problem is intaking to many carbs. He does not eat many sweets or drink sodas.     Expected Outcome -- Short: Continue to reduce sodium intake and read food labels. Long: Continue heart healthy eating patterns discussed with RD. Short: cut back on carbs. Long: adhere to a diet that pertains to him.       Personal Goal #2 Re-Evaluation   Personal Goal #2 Reduce saturated fat, less than 12g per day. Replace bad fats for more heart healthy fats. -- --       Personal Goal #3 Re-Evaluation   Personal Goal #3 Read labels and reduce sodium intake to below 2300mg . Ideally 1500mg  per day. -- --              Nutrition Goals Discharge (Final Nutrition Goals Re-Evaluation):  Nutrition Goals Re-Evaluation - 07/24/23 0948       Goals   Current Weight 238 lb (108 kg)    Nutrition Goal Cut back on Carbs    Comment Alan Campbell has been doing well at eating more fruit and potatoes. His problem is intaking to many carbs. He does not eat many sweets or drink sodas.    Expected Outcome Short: cut back on carbs. Long: adhere to a diet that pertains to him.             Psychosocial: Target Goals: Acknowledge presence or  absence of significant depression and/or stress, maximize coping skills, provide positive support system. Participant is able to verbalize types and ability to use techniques and skills needed for reducing stress and depression.   Education: Stress, Anxiety, and Depression - Group verbal and visual presentation to define topics covered.  Reviews how body is impacted by stress, anxiety, and depression.  Also discusses healthy ways to reduce stress and to treat/manage anxiety and depression.  Written material given at graduation.   Education: Sleep Hygiene -Provides group verbal and written instruction about how sleep can affect your health.  Define sleep hygiene, discuss sleep cycles and impact of sleep habits. Review good sleep hygiene tips.    Initial Review & Psychosocial Screening:  Initial Psych Review & Screening - 05/02/23 1342       Initial Review   Current issues with Current Stress Concerns    Source of Stress Concerns --    Comments Health issues this last year      Family Dynamics   Good Support System? Yes   wife     Barriers   Psychosocial  barriers to participate in program There are no identifiable barriers or psychosocial needs.;The patient should benefit from training in stress management and relaxation.      Screening Interventions   Interventions Encouraged to exercise;Provide feedback about the scores to participant;To provide support and resources with identified psychosocial needs    Expected Outcomes Short Term goal: Utilizing psychosocial counselor, staff and physician to assist with identification of specific Stressors or current issues interfering with healing process. Setting desired goal for each stressor or current issue identified.;Long Term Goal: Stressors or current issues are controlled or eliminated.;Short Term goal: Identification and review with participant of any Quality of Life or Depression concerns found by scoring the questionnaire.;Long Term goal:  The participant improves quality of Life and PHQ9 Scores as seen by post scores and/or verbalization of changes             Quality of Life Scores:   Quality of Life - 05/08/23 1147       Quality of Life   Select Quality of Life      Quality of Life Scores   Health/Function Pre 23.47 %    Socioeconomic Pre 23 %    Psych/Spiritual Pre 23.57 %    Family Pre 22.5 %    GLOBAL Pre 23.27 %            Scores of 19 and below usually indicate a poorer quality of life in these areas.  A difference of  2-3 points is a clinically meaningful difference.  A difference of 2-3 points in the total score of the Quality of Life Index has been associated with significant improvement in overall quality of life, self-image, physical symptoms, and general health in studies assessing change in quality of life.  PHQ-9: Review Flowsheet  More data exists      05/08/2023 06/19/2022 06/14/2022 06/04/2022 01/17/2022  Depression screen PHQ 2/9  Decreased Interest 0 0 0 0 0  Down, Depressed, Hopeless 0 0 0 0 0  PHQ - 2 Score 0 0 0 0 0  Altered sleeping 0 0 0 0 0  Tired, decreased energy 1 0 0 0 0  Change in appetite 0 0 0 0 0  Feeling bad or failure about yourself  0 0 0 0 0  Trouble concentrating 0 0 0 0 0  Moving slowly or fidgety/restless 0 0 0 0 0  Suicidal thoughts 0 0 0 0 0  PHQ-9 Score 1 0 0 0 0  Difficult doing work/chores Not difficult at all Not difficult at all - Not difficult at all -   Interpretation of Total Score  Total Score Depression Severity:  1-4 = Minimal depression, 5-9 = Mild depression, 10-14 = Moderate depression, 15-19 = Moderately severe depression, 20-27 = Severe depression   Psychosocial Evaluation and Intervention:  Psychosocial Evaluation - 05/02/23 1402       Psychosocial Evaluation & Interventions   Interventions Encouraged to exercise with the program and follow exercise prescription;Stress management education;Relaxation education    Comments Mr. 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 He states he is feeling well and has started walking more. 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 He states this last year has been filled with health problems with him and prior to last December he did not have much of a heart history. He states he takes it day by day and  just pushes forward. His wife is his main support system. He is interested in the education classes and learning more of how to independently manage his health.    Expected Outcomes Short: attend cardiac rehab for education and exercise. Long; develop and maintain positive self care habits    Continue Psychosocial Services  Follow up required by staff             Psychosocial Re-Evaluation:  Psychosocial Re-Evaluation     Row Name 06/03/23 [phone] 06/14/23 9604 07/24/23 0946         Psychosocial Re-Evaluation   Current issues with None Identified None Identified None Identified     Comments Alan Campbell states that the program seems to help him stay positive and manage his mental health. His wife and his fellow gun club members make up his support group. Alan Campbell reports no major stressors at this time. Alan Campbell enjoys target shooting 2-3 times a week, reading, walking the dog, and cook for stress relief. He also states that he is sleeping well since his valve replacement. He also reports having a good support system made up by his wife and friends. He believes that exercise in the program has been great for his mental state and wants to continue to exercise outside of rehab. Patient reports no issues with their current mental states, sleep, stress, depression or anxiety. Will follow up with patient in a few weeks for any changes.     Expected Outcomes Short: Attend [facility] stress management education to decrease stress. Long: Maintain exercise Post [facility] to keep stress at a minimum. Short: Continue  to attend rehab for mental boost. Long: Continue to maintain positive outlook. Short: Continue to exercise regularly to support mental health and notify staff of any changes. Long: maintain mental health and well being through teaching of rehab or prescribed medications independently.     Interventions Encouraged to attend Cardiac Rehabilitation for the exercise Encouraged to attend Cardiac Rehabilitation for the exercise Encouraged to attend Cardiac Rehabilitation for the exercise     Continue Psychosocial Services  Follow up required by staff Follow up required by staff Follow up required by staff              Psychosocial Discharge (Final Psychosocial Re-Evaluation):  Psychosocial Re-Evaluation - 07/24/23 0946       Psychosocial Re-Evaluation   Current issues with None Identified    Comments Patient reports no issues with their current mental states, sleep, stress, depression or anxiety. Will follow up with patient in a few weeks for any changes.    Expected Outcomes Short: Continue to exercise regularly to support mental health and notify staff of any changes. Long: maintain mental health and well being through teaching of rehab or prescribed medications independently.    Interventions Encouraged to attend Cardiac Rehabilitation for the exercise    Continue Psychosocial Services  Follow up required by staff             Vocational Rehabilitation: Provide vocational rehab assistance to qualifying candidates.   Vocational Rehab Evaluation & Intervention:  Vocational Rehab - 05/02/23 1339       Initial Vocational Rehab Evaluation & Intervention   Assessment shows need for Vocational Rehabilitation No             Education: Education Goals: Education classes will be provided on a variety of topics geared toward better understanding of heart health and risk factor modification. Participant will state understanding/return demonstration of topics presented as noted by  education  test scores.  Learning Barriers/Preferences:  Learning Barriers/Preferences - 05/02/23 1338       Learning Barriers/Preferences   Learning Barriers None    Learning Preferences None             General Cardiac Education Topics:  AED/CPR: - Group verbal and written instruction with the use of models to demonstrate the basic use of the AED with the basic ABC's of resuscitation.   Anatomy and Cardiac Procedures: - Group verbal and visual presentation and models provide information about basic cardiac anatomy and function. Reviews the testing methods done to diagnose heart disease and the outcomes of the test results. Describes the treatment choices: Medical Management, Angioplasty, or Coronary Bypass Surgery for treating various heart conditions including Myocardial Infarction, Angina, Valve Disease, and Cardiac Arrhythmias.  Written material given at graduation.   Medication Safety: - Group verbal and visual instruction to review commonly prescribed medications for heart and lung disease. Reviews the medication, class of the drug, and side effects. Includes the steps to properly store meds and maintain the prescription regimen.  Written material given at graduation.   Intimacy: - Group verbal instruction through game format to discuss how heart and lung disease can affect sexual intimacy. Written material given at graduation..   Know Your Numbers and Heart Failure: - Group verbal and visual instruction to discuss disease risk factors for cardiac and pulmonary disease and treatment options.  Reviews associated critical values for Overweight/Obesity, Hypertension, Cholesterol, and Diabetes.  Discusses basics of heart failure: signs/symptoms and treatments.  Introduces Heart Failure Zone chart for action plan for heart failure.  Written material given at graduation.   Infection Prevention: - Provides verbal and written material to individual with discussion of infection  control including proper hand washing and proper equipment cleaning during exercise session. Flowsheet Row Cardiac Rehab from 07/10/2023 in [facility] Cardiac and Pulmonary Rehab  Date 05/08/23  Educator NT  Instruction Review Code 1- Verbalizes Understanding       Falls Prevention: - Provides verbal and written material to individual with discussion of falls prevention and safety. Flowsheet Row Cardiac Rehab from 07/10/2023 in [facility] Cardiac and Pulmonary Rehab  Date 05/08/23  Educator NT  Instruction Review Code 1- Verbalizes Understanding       Other: -Provides group and verbal instruction on various topics (see comments)   Knowledge Questionnaire Score:  Knowledge Questionnaire Score - 05/08/23 1144       Knowledge Questionnaire Score   Pre Score 25/26             Core Components/Risk Factors/Patient Goals at Admission:  Personal Goals and Risk Factors at Admission - 05/02/23 1338       Core Components/Risk Factors/Patient Goals on Admission   Hypertension Yes    Intervention Provide education on lifestyle modifcations including regular physical activity/exercise, weight management, moderate sodium restriction and increased consumption of fresh fruit, vegetables, and low fat dairy, alcohol moderation, and smoking cessation.;Monitor prescription use compliance.    Expected Outcomes Short Term: Continued assessment and intervention until BP is < 140/2mm HG in hypertensive participants. < 130/19mm HG in hypertensive participants with diabetes, heart failure or chronic kidney disease.;Long Term: Maintenance of blood pressure at goal levels.    Lipids Yes    Intervention Provide education and support for participant on nutrition & aerobic/resistive exercise along with prescribed medications to achieve LDL 70mg , HDL >40mg .    Expected Outcomes Short Term: Participant states understanding of desired cholesterol values and is compliant with medications prescribed.  Participant is  following exercise prescription and nutrition guidelines.;Long Term: Cholesterol controlled with medications as prescribed, with individualized exercise RX and with personalized nutrition plan. Value goals: LDL < 70mg , HDL > 40 mg.             Education:Diabetes - Individual verbal and written instruction to review signs/symptoms of diabetes, desired ranges of glucose level fasting, after meals and with exercise. Acknowledge that pre and post exercise glucose checks will be done for 3 sessions at entry of program.   Core Components/Risk Factors/Patient Goals Review:   Goals and Risk Factor Review     Row Name 06/03/23 0933 06/14/23 0945 07/24/23 0951         Core Components/Risk Factors/Patient Goals Review   Personal Goals Review Hypertension Hypertension;Weight Management/Obesity Hypertension;Weight Management/Obesity     Review Alan Campbell says that he has a blood pressure cuff at home but states that he is not currently taking his pressure at home. He is aware that he should be taking it and has been advised by his doctors. Alan Campbell continues to work towards weight loss. He weighed in today at 233 lb 8 oz and has a long term goal of getting down to 200 lbs. Alan Campbell also states that he has been dealing with high blood pressure for most of his life and is working on reducing sodium intake and not adding salt at the table. He does own a BP cuff at home, but has not been checking it routinely since it has been monitored in rehab. He plans to get back to checking his BP multiple times a week. Alan Campbell wants to get his weight back down below 220lbs. He does not eat any fried foods. Informed him that most of the time we intake to many calories. He is going to try to lose a few pounds in the next few weeks.     Expected Outcomes Short: Start taking blood pressure at home. Long: Monitor blood pressure in order to manage hypertension. Short: Continue to work towards weight goal through diet and exercise. Long:  Continue to manage lifestyle risk factors. Short: lose a few pounds in the next few weeks. Long: reach weight goal.              Core Components/Risk Factors/Patient Goals at Discharge (Final Review):   Goals and Risk Factor Review - 07/24/23 0951       Core Components/Risk Factors/Patient Goals Review   Personal Goals Review Hypertension;Weight Management/Obesity    Review Alan Campbell wants to get his weight back down below 220lbs. He does not eat any fried foods. Informed him that most of the time we intake to many calories. He is going to try to lose a few pounds in the next few weeks.    Expected Outcomes Short: lose a few pounds in the next few weeks. Long: reach weight goal.             ITP Comments:  ITP Comments     Row Name 05/02/23 1347 05/08/23 1047 05/27/23 0928 06/05/23 1139 07/03/23 1242   ITP Comments Initial phone call completed. Diagnosis can be found in [facility] 10/15. EP Orientation scheduled for Wednesday 11/20 at 9:30. Completed and gym orientation. Initial ITP created and sent for review to Dr. Bethann Punches, Medical Director. First full day of exercise!  Patient was oriented to gym and equipment including functions, settings, policies, and procedures.  Patient's individual exercise prescription and treatment plan were reviewed.  All starting workloads were established based on  the results of the 6 minute walk test done at initial orientation visit.  The plan for exercise progression was also introduced and progression will be customized based on patient's performance and goals. 30 Day review completed. Medical Director ITP review done, changes made as directed, and signed approval by Medical Director.    new to program 30 Day review completed. Medical Director ITP review done, changes made as directed, and signed approval by Medical Director.    Row Name 07/31/23 0808           ITP Comments 30 Day review completed. Medical Director ITP review done, changes made as  directed, and signed approval by Medical Director.                Comments: 30 day review

## 2023-08-05 ENCOUNTER — Encounter: Payer: Medicare Other | Admitting: *Deleted

## 2023-08-05 DIAGNOSIS — Z48812 Encounter for surgical aftercare following surgery on the circulatory system: Secondary | ICD-10-CM | POA: Diagnosis not present

## 2023-08-05 DIAGNOSIS — Z952 Presence of prosthetic heart valve: Secondary | ICD-10-CM

## 2023-08-05 NOTE — Progress Notes (Signed)
Daily Session Note  Patient Details  Name: Alan Campbell [name]: [id number] Date of Birth: 1951/02/13 Referring Provider:   Flowsheet Row Cardiac Rehab from 05/08/2023 in [facility] Cardiac and Pulmonary Rehab  Referring Provider Dr. Dorothyann Peng, MD       Encounter Date: 08/05/2023  Check In:  Session Check In - 08/05/23 0933       Check-In   Supervising physician immediately available to respond to emergencies See telemetry face sheet for immediately available ER MD    Location [facility]-Cardiac & Pulmonary Rehab    Staff Present Cora Collum, RN, BSN, CCRP;Kelly Hayes BS, ACSM CEP, Exercise Physiologist;Maxon Conetta BS, Exercise Physiologist;Jason Wallace Cullens RDN,LDN;[employer]    Virtual Visit No    Medication changes reported     No    Fall or balance concerns reported    No    Warm-up and Cool-down Performed on first and last piece of equipment    Resistance Training Performed Yes    VAD Patient? No    PAD/SET Patient? No      Pain Assessment   Currently in Pain? No/denies                Social History   Tobacco Use  Smoking Status Former   Current packs/day: 0.00   Average packs/day: 0.5 packs/day for 5.0 years (2.5 ttl pk-yrs)   Types: Cigarettes   Start date: 10/02/1965   Quit date: 10/03/1970   Years since quitting: 52.8  Smokeless Tobacco Never  Tobacco Comments   Quit 1972    Goals Met:  Independence with exercise equipment Exercise tolerated well No report of concerns or symptoms today  Goals Unmet:  Not Applicable  Comments: Pt able to follow exercise prescription today without complaint.  Will continue to monitor for progression.    Dr. Bethann Punches is Medical Director for [facility] Cardiac Rehabilitation.  Dr. Vida Rigger is Medical Director for [facility] Pulmonary Rehabilitation.

## 2023-08-07 ENCOUNTER — Encounter: Payer: Medicare Other | Admitting: *Deleted

## 2023-08-07 DIAGNOSIS — Z952 Presence of prosthetic heart valve: Secondary | ICD-10-CM

## 2023-08-07 DIAGNOSIS — Z48812 Encounter for surgical aftercare following surgery on the circulatory system: Secondary | ICD-10-CM | POA: Diagnosis not present

## 2023-08-07 NOTE — Progress Notes (Signed)
Daily Session Note  Patient Details  Name: Alan Campbell [name]: [id number] Date of Birth: [birth year] Referring Provider:   Flowsheet Row Cardiac Rehab from 05/08/2023 in [facility] Cardiac and Pulmonary Rehab  Referring Provider Dr. Dorothyann Peng, MD       Encounter Date: 08/07/2023  Check In:  Session Check In - 08/07/23 0953       Check-In   Supervising physician immediately available to respond to emergencies See telemetry face sheet for immediately available ER MD    Location [facility]-Cardiac & Pulmonary Rehab    Staff Present Cora Collum, RN, BSN, CCRP;Margaret Best, MS, Exercise Physiologist;Maxon Conetta BS, Exercise Physiologist;Joseph Reino Kent RCP,RRT,BSRT    Virtual Visit No    Medication changes reported     No    Fall or balance concerns reported    No    Warm-up and Cool-down Performed on first and last piece of equipment    Resistance Training Performed Yes    VAD Patient? No    PAD/SET Patient? No      Pain Assessment   Currently in Pain? No/denies                Social History   Tobacco Use  Smoking Status Former   Current packs/day: 0.00   Average packs/day: 0.5 packs/day for 5.0 years (2.5 ttl pk-yrs)   Types: Cigarettes   Start date: 10/02/1965   Quit date: 10/03/1970   Years since quitting: 52.8  Smokeless Tobacco Never  Tobacco Comments   Quit 1972    Goals Met:  Independence with exercise equipment Exercise tolerated well No report of concerns or symptoms today  Goals Unmet:  Not Applicable  Comments: Pt able to follow exercise prescription today without complaint.  Will continue to monitor for progression.    Dr. Bethann Punches is Medical Director for [facility] Cardiac Rehabilitation.  Dr. Vida Rigger is Medical Director for [facility] Pulmonary Rehabilitation.

## 2023-08-09 ENCOUNTER — Encounter: Payer: Medicare Other | Admitting: *Deleted

## 2023-08-09 DIAGNOSIS — Z48812 Encounter for surgical aftercare following surgery on the circulatory system: Secondary | ICD-10-CM | POA: Diagnosis not present

## 2023-08-09 DIAGNOSIS — Z952 Presence of prosthetic heart valve: Secondary | ICD-10-CM

## 2023-08-09 NOTE — Progress Notes (Signed)
Daily Session Note  Patient Details  Name: Alan Campbell [name]: [id number] Date of Birth: 21-Feb-1951 Referring Provider:   Flowsheet Row Cardiac Rehab from 05/08/2023 in [facility] Cardiac and Pulmonary Rehab  Referring Provider Dr. Dorothyann Peng, MD       Encounter Date: 08/09/2023  Check In:  Session Check In - 08/09/23 0933       Check-In   Supervising physician immediately available to respond to emergencies See telemetry face sheet for immediately available ER MD    Location [facility]-Cardiac & Pulmonary Rehab    Staff Present Cora Collum, RN, BSN, CCRP;Noah Tickle, BS, Exercise Physiologist;Joseph Hood RCP,RRT,BSRT    Virtual Visit No    Medication changes reported     No    Fall or balance concerns reported    No    Warm-up and Cool-down Performed on first and last piece of equipment    Resistance Training Performed Yes    VAD Patient? No    PAD/SET Patient? No      Pain Assessment   Currently in Pain? No/denies                Social History   Tobacco Use  Smoking Status Former   Current packs/day: 0.00   Average packs/day: 0.5 packs/day for 5.0 years (2.5 ttl pk-yrs)   Types: Cigarettes   Start date: 10/02/1965   Quit date: 10/03/1970   Years since quitting: 52.8  Smokeless Tobacco Never  Tobacco Comments   Quit 1972    Goals Met:  Independence with exercise equipment Exercise tolerated well No report of concerns or symptoms today  Goals Unmet:  Not Applicable  Comments: Pt able to follow exercise prescription today without complaint.  Will continue to monitor for progression.    Dr. Bethann Punches is Medical Director for [facility] Cardiac Rehabilitation.  Dr. Vida Rigger is Medical Director for [facility] Pulmonary Rehabilitation.

## 2023-08-12 ENCOUNTER — Encounter: Payer: Medicare Other | Admitting: *Deleted

## 2023-08-12 DIAGNOSIS — Z952 Presence of prosthetic heart valve: Secondary | ICD-10-CM

## 2023-08-12 DIAGNOSIS — Z48812 Encounter for surgical aftercare following surgery on the circulatory system: Secondary | ICD-10-CM | POA: Diagnosis not present

## 2023-08-12 NOTE — Progress Notes (Signed)
 Cardiac Individual Treatment Plan  Patient Details  Name: Alan Campbell [name]: [id number] Date of Birth: 1950/09/01 Referring Provider:   Flowsheet Row Cardiac Rehab from 05/08/2023 in [facility] Cardiac and Pulmonary Rehab  Referring Provider Dr. Dorothyann Peng, MD       Initial Encounter Date:  Flowsheet Row Cardiac Rehab from 05/08/2023 in [facility] Cardiac and Pulmonary Rehab  Date 05/08/23       Visit Diagnosis: S/P TAVR (transcatheter aortic valve replacement)  Patient's Home Medications on Admission:  Current Outpatient Medications:    Acetaminophen 500 MG capsule, Take by mouth., Disp: , Rfl:    albuterol (VENTOLIN HFA) 108 (90 Base) MCG/ACT inhaler, Inhale 2 puffs into the lungs every 6 (six) hours as needed for wheezing or shortness of breath., Disp: 6.7 g, Rfl: 0   allopurinol (ZYLOPRIM) 100 MG tablet, Take 50 mg by mouth daily., Disp: , Rfl:    amitriptyline (ELAVIL) 25 MG tablet, Take 1 tablet by mouth at bedtime. (Patient not taking: Reported on 05/02/2023), Disp: , Rfl:    aspirin EC 81 MG tablet, Take 81 mg by mouth daily. Swallow whole., Disp: , Rfl:    Cholecalciferol (VITAMIN D) 2000 units CAPS, Take 1 capsule (2,000 Units total) by mouth daily., Disp: 30 capsule, Rfl: 0   Cinnamon 500 MG TABS, Take 1,000 mg by mouth daily., Disp: , Rfl:    clonazePAM (KLONOPIN) 1 MG tablet, Take 1 mg by mouth at bedtime as needed., Disp: , Rfl:    CRANBERRY PO, Take 4,200 mg by mouth daily., Disp: , Rfl:    empagliflozin (JARDIANCE) 10 MG TABS tablet, Take 10 mg by mouth daily., Disp: , Rfl:    febuxostat (ULORIC) 40 MG tablet, Take 1 tablet (40 mg total) by mouth daily. (Patient not taking: Reported on 05/02/2023), Disp: 90 tablet, Rfl: 1   fish oil-omega-3 fatty acids 1000 MG capsule, Take 1,000 mg by mouth daily., Disp: , Rfl:    levothyroxine (SYNTHROID) 137 MCG tablet, Take 137 mcg by mouth daily before breakfast., Disp: , Rfl:    metoprolol succinate (TOPROL-XL) 25 MG 24 hr tablet,  Take 12.5 mg by mouth daily., Disp: , Rfl:    Multiple Vitamin (MULTI-VITAMIN) tablet, Take 1 tablet by mouth daily., Disp: , Rfl:    Multiple Vitamins-Minerals (CENTRUM SILVER 50+MEN) TABS, Take 1 tablet by mouth daily., Disp: , Rfl:    potassium chloride (KLOR-CON M) 10 MEQ tablet, Take 10 mEq by mouth daily as needed (As needed with Torsemide)., Disp: , Rfl:    predniSONE (DELTASONE) 20 MG tablet, Take 40 mg by mouth as needed (as needed for gout flare ups)., Disp: , Rfl:    rosuvastatin (CRESTOR) 40 MG tablet, Take 1 tablet (40 mg total) by mouth daily., Disp: 90 tablet, Rfl: 1   sacubitril-valsartan (ENTRESTO) 24-26 MG, Take 1 tablet by mouth 2 (two) times daily., Disp: , Rfl:    spironolactone (ALDACTONE) 25 MG tablet, Take 25 mg by mouth daily., Disp: , Rfl:    Testosterone 20.25 MG/ACT (1.62%) GEL, Apply 2 each topically daily. 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 Moderate to severe atypia, edge involved.   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    Tobacco Use: Social History   Tobacco Use  Smoking Status  Former   Current packs/day: 0.00   Average packs/day: 0.5 packs/day for 5.0 years (2.5 ttl pk-yrs)   Types: Cigarettes   Start date: 10/02/1965   Quit date: 10/03/1970   Years since quitting: 52.8  Smokeless Tobacco Never  Tobacco Comments   Quit 1972    Labs: Review Flowsheet  More data exists      Latest Ref Rng & Units 06/02/2019 02/25/2020 03/02/2021 11/23/2021 01/17/2022  Labs for ITP Cardiac and Pulmonary Rehab  Cholestrol <200 mg/dL 469  629  528  - 413   LDL (calc) mg/dL (calc) 91  244  88  - 010   HDL-C > OR = 40 mg/dL 48  52  41  - 56   Trlycerides <150 mg/dL 272  536  644  - 034   Hemoglobin A1c 4.8 - 5.6 % 4.6  4.9  - 4.5  -     Exercise Target Goals: Exercise Program Goal: Individual exercise prescription set using results from initial 6 min walk test and THRR while considering  patient's activity barriers and safety.   Exercise Prescription Goal: Initial exercise prescription builds to 30-45 minutes a day of aerobic activity, 2-3 days per week.  Home exercise guidelines will be given to patient during program as part of exercise prescription that the participant will acknowledge.   Education: Aerobic Exercise: - Group verbal and visual presentation on the components of exercise prescription. Introduces F.I.T.T principle from ACSM for exercise prescriptions.  Reviews F.I.T.T. principles of aerobic exercise including progression. Written material given at graduation.   Education: Resistance Exercise: - Group verbal and visual presentation on the components of exercise prescription. Introduces F.I.T.T principle from ACSM for exercise prescriptions  Reviews F.I.T.T. principles of resistance exercise including progression. Written material given at graduation. Flowsheet Row Cardiac Rehab from 07/10/2023 in [facility] Cardiac and Pulmonary Rehab  Date 06/17/23  Educator NT  Instruction Review Code 1- Verbalizes Understanding        Education: Exercise & Equipment Safety: -  Individual verbal instruction and demonstration of equipment use and safety with use of the equipment. Flowsheet Row Cardiac Rehab from 07/10/2023 in [facility] Cardiac and Pulmonary Rehab  Date 05/08/23  Educator NT  Instruction Review Code 1- Verbalizes Understanding       Education: Exercise Physiology & General Exercise Guidelines: - Group verbal and written instruction with models to review the exercise physiology of the cardiovascular system and associated critical values. Provides general exercise guidelines with specific guidelines to those with heart or lung disease.  Flowsheet Row Cardiac Rehab from 07/10/2023 in [facility] Cardiac and Pulmonary Rehab  Date 06/10/23  Educator [facility]  Instruction Review Code 1- [employer]  Education: Flexibility, Balance, Mind/Body Relaxation: - Group verbal and visual presentation with interactive activity on the components of exercise prescription. Introduces F.I.T.T principle from ACSM for exercise prescriptions. Reviews F.I.T.T. principles of flexibility and balance exercise training including progression. Also discusses the mind body connection.  Reviews various relaxation techniques to help reduce and manage stress (i.e. Deep breathing, progressive muscle relaxation, and visualization). Balance handout provided to take home. Written material given at graduation. Flowsheet Row Cardiac Rehab from 07/10/2023 in [facility] Cardiac and Pulmonary Rehab  Date 06/17/23  Educator NT  Instruction Review Code 1- Verbalizes Understanding       Activity Barriers & Risk Stratification:  Activity Barriers & Cardiac Risk Stratification - 05/08/23 1049       Activity Barriers & Cardiac Risk Stratification   Activity Barriers Joint Problems;Balance Concerns;Arthritis;Back Problems;Other (comment)    Comments gout    Cardiac Risk Stratification Moderate             6 Minute Walk:  6 Minute Walk     Row Name 05/08/23 1047 07/29/23 0937       6  Minute Walk   Phase Initial Discharge    Distance 905 feet 1280 feet    Distance % Change -- 41.44 %    Distance Feet Change -- 375 ft    Walk Time 6 minutes 6 minutes    # of Rest Breaks 0 0    MPH 1.71 2.42    METS 1.7 2.34    RPE 10 13    Perceived Dyspnea  0 0    VO2 Peak 5.96 8.19    Symptoms No No    Resting HR 73 bpm 74 bpm    Resting BP 104/62 100/62    Resting Oxygen Saturation  98 % 98 %    Exercise Oxygen Saturation  during 6 min walk 100 % 95 %    Max Ex. HR 95 bpm 100 bpm    Max Ex. BP 132/70 124/62    2 Minute Post BP 110/68 112/62             Oxygen Initial Assessment:   Oxygen Re-Evaluation:   Oxygen Discharge (Final Oxygen Re-Evaluation):   Initial Exercise Prescription:  Initial Exercise Prescription - 05/08/23 1100       Date of Initial Exercise RX and Referring Provider   Date 05/08/23    Referring Provider Dr. Dorothyann Peng, MD      Oxygen   Maintain Oxygen Saturation 88% or higher      Treadmill   MPH 1.6    Grade 0    Minutes 15    METs 2.23      Recumbant Bike   Level 1    RPM 50    Watts 15    Minutes 15    METs 1.7      NuStep   Level 2    SPM 80    Minutes 15    METs 1.7      Track   Laps 18    Minutes 15    METs 1.98      Prescription Details   Frequency (times per week) 3    Duration Progress to 30 minutes of continuous aerobic without signs/symptoms of physical distress      Intensity   THRR 40-80% of Max Heartrate 102-132    Ratings of Perceived Exertion 11-13    Perceived Dyspnea 0-4      Progression   Progression Continue to progress  workloads to maintain intensity without signs/symptoms of physical distress.      Resistance Training   Training Prescription Yes    Weight 5 lb    Reps 10-15             Perform Capillary Blood Glucose checks as needed.  Exercise Prescription Changes:   Exercise Prescription Changes     Row Name 06/13/23 0700 06/28/23 0800 07/11/23 1000 07/15/23 1000  07/25/23 0700     Response to Exercise   Blood Pressure (Admit) 128/62 104/76 112/60 -- 110/70   Blood Pressure (Exercise) 142/60 124/64 -- -- --   Blood Pressure (Exit) 108/62 104/56 102/60 -- 124/62   Heart Rate (Admit) 85 bpm 83 bpm 71 bpm -- 80 bpm   Heart Rate (Exercise) 121 bpm 115 bpm 130 bpm -- 109 bpm   Heart Rate (Exit) 52 bpm 73 bpm 54 bpm -- 52 bpm   Rating of Perceived Exertion (Exercise) 13 12 13  -- 14   Perceived Dyspnea (Exercise) 0 -- -- -- --   Symptoms none none none -- none   Comments first 2 weeks of exercise -- -- -- --   Duration Progress to 30 minutes of  aerobic without signs/symptoms of physical distress Continue with 30 min of aerobic exercise without signs/symptoms of physical distress. Continue with 30 min of aerobic exercise without signs/symptoms of physical distress. Continue with 30 min of aerobic exercise without signs/symptoms of physical distress. Continue with 30 min of aerobic exercise without signs/symptoms of physical distress.   Intensity THRR unchanged THRR unchanged THRR unchanged THRR unchanged THRR unchanged     Progression   Progression Continue to progress workloads to maintain intensity without signs/symptoms of physical distress. Continue to progress workloads to maintain intensity without signs/symptoms of physical distress. Continue to progress workloads to maintain intensity without signs/symptoms of physical distress. Continue to progress workloads to maintain intensity without signs/symptoms of physical distress. Continue to progress workloads to maintain intensity without signs/symptoms of physical distress.   Average METs 2.65 2.76 2.86 2.86 3.04     Resistance Training   Training Prescription Yes Yes Yes Yes Yes   Weight 5 lb 5 lb 5 lb 5 lb 5 lb   Reps 10-15 10-15 10-15 10-15 10-15     Interval Training   Interval Training No No No No No     Treadmill   MPH 2.1 1.8 2 2  1.5   Grade 0 0.5 3 3 2    Minutes 15 15 15 15 15    METs 2.61  2.5 3.36 3.36 2.56     Recumbant Bike   Level 1.5 -- -- -- --   Watts 15 -- -- -- --   Minutes 15 -- -- -- --   METs 2.45 -- -- -- --     NuStep   Level 3 4 7 7 7    Minutes 15 15 15 15 30    METs 3.4 3 3.6 3.6 3.6     Biostep-RELP   Level 2 -- -- -- 2   Minutes 15 -- -- -- 15   METs 2.2 -- -- -- 2.4     Track   Laps 18 -- 16 16 23    Minutes 15 -- 15 15 15    METs 1.98 -- 1.87 1.87 2.25     Home Exercise Plan   Plans to continue exercise at -- -- -- Home (comment)  walking and looking into joining a fitness center. Home (comment)  walking and looking into  joining a fitness center.   Frequency -- -- -- Add 2 additional days to program exercise sessions. Add 2 additional days to program exercise sessions.   Initial Home Exercises Provided -- -- -- 07/15/23 07/15/23     Oxygen   Maintain Oxygen Saturation 88% or higher 88% or higher 88% or higher 88% or higher 88% or higher    Row Name 08/07/23 1100             Response to Exercise   Blood Pressure (Admit) 110/60       Blood Pressure (Exit) 112/60       Heart Rate (Admit) 76 bpm       Heart Rate (Exercise) 108 bpm       Heart Rate (Exit) 54 bpm       Rating of Perceived Exertion (Exercise) 13       Symptoms none       Duration Continue with 30 min of aerobic exercise without signs/symptoms of physical distress.       Intensity THRR unchanged         Progression   Progression Continue to progress workloads to maintain intensity without signs/symptoms of physical distress.       Average METs 2.99         Resistance Training   Training Prescription Yes       Weight 5 lb       Reps 10-15         Interval Training   Interval Training No         Treadmill   MPH 1.8       Grade 4       Minutes 15       METs 3.37         NuStep   Level 8       Minutes 15       METs 3.7         Track   Laps 17       Minutes 15       METs 1.92         Home Exercise Plan   Plans to continue exercise at Home (comment)   walking and looking into joining a fitness center.       Frequency Add 2 additional days to program exercise sessions.       Initial Home Exercises Provided 07/15/23         Oxygen   Maintain Oxygen Saturation 88% or higher                Exercise Comments:   Exercise Comments     Row Name 05/27/23 0929 08/12/23 0938         Exercise Comments First full day of exercise!  Patient was oriented to gym and equipment including functions, settings, policies, and procedures.  Patient's individual exercise prescription and treatment plan were reviewed.  All starting workloads were established based on the results of the 6 minute walk test done at initial orientation visit.  The plan for exercise progression was also introduced and progression will be customized based on patient's performance and goals. Deontae graduated today from  rehab with 36 sessions completed.  Details of the patient's exercise prescription and what He needs to do in order to continue the prescription and progress were discussed with patient.  Patient was given a copy of prescription and goals.  Patient verbalized understanding. Shubham plans to continue to exercise by walking and looking into  joining a fitness center.               Exercise Goals and Review:   Exercise Goals     Row Name 05/08/23 1049             Exercise Goals   Increase Physical Activity Yes       Intervention Provide advice, education, support and counseling about physical activity/exercise needs.;Develop an individualized exercise prescription for aerobic and resistive training based on initial evaluation findings, risk stratification, comorbidities and participant's personal goals.       Expected Outcomes Short Term: Attend rehab on a regular basis to increase amount of physical activity.;Long Term: Exercising regularly at least 3-5 days a week.;Long Term: Add in home exercise to make exercise part of routine and to increase amount of  physical activity.       Increase Strength and Stamina Yes       Intervention Develop an individualized exercise prescription for aerobic and resistive training based on initial evaluation findings, risk stratification, comorbidities and participant's personal goals.;Provide advice, education, support and counseling about physical activity/exercise needs.       Expected Outcomes Short Term: Increase workloads from initial exercise prescription for resistance, speed, and METs.;Short Term: Perform resistance training exercises routinely during rehab and add in resistance training at home;Long Term: Improve cardiorespiratory fitness, muscular endurance and strength as measured by increased METs and functional capacity ( )       Able to understand and use rate of perceived exertion (RPE) scale Yes       Intervention Provide education and explanation on how to use RPE scale       Expected Outcomes Short Term: Able to use RPE daily in rehab to express subjective intensity level;Long Term:  Able to use RPE to guide intensity level when exercising independently       Able to understand and use Dyspnea scale Yes       Intervention Provide education and explanation on how to use Dyspnea scale       Expected Outcomes Long Term: Able to use Dyspnea scale to guide intensity level when exercising independently;Short Term: Able to use Dyspnea scale daily in rehab to express subjective sense of shortness of breath during exertion       Knowledge and understanding of Target Heart Rate Range (THRR) Yes       Intervention Provide education and explanation of THRR including how the numbers were predicted and where they are located for reference       Expected Outcomes Short Term: Able to state/look up THRR;Long Term: Able to use THRR to govern intensity when exercising independently;Short Term: Able to use daily as guideline for intensity in rehab       Able to check pulse independently Yes       Intervention Provide  education and demonstration on how to check pulse in carotid and radial arteries.;Review the importance of being able to check your own pulse for safety during independent exercise       Expected Outcomes Short Term: Able to explain why pulse checking is important during independent exercise;Long Term: Able to check pulse independently and accurately       Understanding of Exercise Prescription Yes       Intervention Provide education, explanation, and written materials on patient's individual exercise prescription       Expected Outcomes Short Term: Able to explain program exercise prescription;Long Term: Able to explain home exercise prescription to exercise independently  Exercise Goals Re-Evaluation :  Exercise Goals Re-Evaluation     Row Name 05/27/23 1610 06/13/23 0726 06/28/23 0851 07/11/23 1045 07/15/23 1004     Exercise Goal Re-Evaluation   Exercise Goals Review Able to understand and use rate of perceived exertion (RPE) scale;Able to understand and use Dyspnea scale;Knowledge and understanding of Target Heart Rate Range (THRR);Understanding of Exercise Prescription Increase Physical Activity;Increase Strength and Stamina;Understanding of Exercise Prescription Increase Physical Activity;Increase Strength and Stamina;Understanding of Exercise Prescription Increase Physical Activity;Increase Strength and Stamina;Understanding of Exercise Prescription Increase Physical Activity;Able to understand and use rate of perceived exertion (RPE) scale;Knowledge and understanding of Target Heart Rate Range (THRR);Understanding of Exercise Prescription;Increase Strength and Stamina;Able to understand and use Dyspnea scale;Able to check pulse independently   Comments Reviewed RPE and dyspnea scale, THR and program prescription with pt today.  Pt voiced understanding and was given a copy of goals to take home. Carlis Abbott is off to a great start in the program. He has been able to increase his  speed on the treadmill to 2.1 mph, and increase to level 1.5 on the recumbent bike. We will continue to monitor his progress in the program. Carlis Abbott is doing well in the program. He recently added 0.5% incline to his treadmill workload while maintaining a speed of 1.8 mph. He also improved to level 4 on the T4 nustep. We will continue to monitor his progress in the program. Carlis Abbott continues to do well in the program. He recently increased his treadmill workload to a speed of 2 mph with an incline of 3%. He also improved to level 7 on the T4 nustep. We will encourage him to use other aerobic machines to diversify his exercises. We will continue to monitor his progress in the program. Reviewed home exercise with pt today.  Pt plans to walk at home and look into joining either the [employer] or [employer] for exercise.  Reviewed THR, pulse, RPE, sign and symptoms, pulse oximetery and when to call 911 or MD.  Also discussed weather considerations and indoor options.  Pt voiced understanding.   Expected Outcomes Short: Use RPE daily to regulate intensity. Long: Follow program prescription in THR. -- Short: Continue to progressively increase treadmill workload. Long: Continue exercise to improve strength and stamina. Short: Begin to use other aerobic machines in rehab. Long: Continue exercise to improve strength and stamina. Short: look into joining a fitness center. Add 1-2 days a week of exercise at home or in fitness center on off days of cardiac rehab. Long: maintain independent exercise routine upon graduation from cardiac rehab.    Row Name 07/25/23 0750 08/07/23 1116 08/12/23 0942         Exercise Goal Re-Evaluation   Exercise Goals Review Increase Physical Activity;Increase Strength and Stamina;Understanding of Exercise Prescription Increase Physical Activity;Increase Strength and Stamina;Understanding of Exercise Prescription Increase Physical Activity;Able to understand and use rate of perceived exertion  (RPE) scale;Understanding of Exercise Prescription     Comments Carlis Abbott is doing well in rehab. He recently increased his laps walked on the track to 23 laps. He did see a decrease in his treadmill workload to a speed of 1.5 mph and a 2% incline. He has stayed consistent on the T4 nustep at level 7 and the biostep at level 2. He also is due for his post and will look to improve on it. We will continue to monitor his progress. Carlis Abbott continues to do well in rehab and is close to graduating. He recently completed  his post and improved by 41%! He also improved to level 8 on the T4 nustpe and has done well walking both the track and treadmill. We will continue to monitor his progress until he graduates from the program. Carlis Abbott is graduating from cardiac rehab today. He plans to continue to exercise on his own by walking and joining [employer].     Expected Outcomes Short: Improve on post . Long: Continue exercise to improve strength and stamina. Short: Graduate. Long: Continue to exercise independently. Short: grauate from cardiac rehab. Long: maintain independent exercise routine to help control cardiac risk factors.              Discharge Exercise Prescription (Final Exercise Prescription Changes):  Exercise Prescription Changes - 08/07/23 1100       Response to Exercise   Blood Pressure (Admit) 110/60    Blood Pressure (Exit) 112/60    Heart Rate (Admit) 76 bpm    Heart Rate (Exercise) 108 bpm    Heart Rate (Exit) 54 bpm    Rating of Perceived Exertion (Exercise) 13    Symptoms none    Duration Continue with 30 min of aerobic exercise without signs/symptoms of physical distress.    Intensity THRR unchanged      Progression   Progression Continue to progress workloads to maintain intensity without signs/symptoms of physical distress.    Average METs 2.99      Resistance Training   Training Prescription Yes    Weight 5 lb    Reps 10-15      Interval Training   Interval  Training No      Treadmill   MPH 1.8    Grade 4    Minutes 15    METs 3.37      NuStep   Level 8    Minutes 15    METs 3.7      Track   Laps 17    Minutes 15    METs 1.92      Home Exercise Plan   Plans to continue exercise at Home (comment)   walking and looking into joining a fitness center.   Frequency Add 2 additional days to program exercise sessions.    Initial Home Exercises Provided 07/15/23      Oxygen   Maintain Oxygen Saturation 88% or higher             Nutrition:  Target Goals: Understanding of nutrition guidelines, daily intake of sodium 1500mg , cholesterol 200mg , calories 30% from fat and 7% or less from saturated fats, daily to have 5 or more servings of fruits and vegetables.  Education: All About Nutrition: -Group instruction provided by verbal, written material, interactive activities, discussions, models, and posters to present general guidelines for heart healthy nutrition including fat, fiber, MyPlate, the role of sodium in heart healthy nutrition, utilization of the nutrition label, and utilization of this knowledge for meal planning. Follow up email sent as well. Written material given at graduation. Flowsheet Row Cardiac Rehab from 07/10/2023 in [facility] Cardiac and Pulmonary Rehab  Education need identified 05/08/23  Date 07/10/23  Educator KG  Instruction Review Code 1- Verbalizes Understanding       Biometrics:  Pre Biometrics - 05/08/23 1048       Pre Biometrics   Height 5\' 11"  (1.803 m)    Weight 237 lb 4.8 oz (107.6 kg)    Waist Circumference 47 inches    Hip Circumference 44.5 inches    Waist to Hip  Ratio 1.06 %    BMI (Calculated) 33.11    Single Leg Stand 15.7 seconds             Post Biometrics - 07/29/23 0942        Post  Biometrics   Height 5\' 11"  (1.803 m)    Weight 238 lb 3.2 oz (108 kg)    Waist Circumference 48.1 inches    Hip Circumference 45 inches    Waist to Hip Ratio 1.07 %    BMI (Calculated) 33.24     Single Leg Stand 3.6 seconds             Nutrition Therapy Plan and Nutrition Goals:  Nutrition Therapy & Goals - 05/27/23 1356       Nutrition Therapy   Diet Cardiac, Low Na    Protein (specify units) 90    Fiber 30 grams    Whole Grain Foods 3 servings    Saturated Fats 15 max. grams    Fruits and Vegetables 5 servings/day    Sodium 2 grams      Personal Nutrition Goals   Nutrition Goal Eat 15-30gProtein and 30-60gCarbs at each meal.    Personal Goal #2 Reduce saturated fat, less than 12g per day. Replace bad fats for more heart healthy fats.    Personal Goal #3 Read labels and reduce sodium intake to below 2300mg . Ideally 1500mg  per day.    Comments Patient not always drinking enough water, his kidney Dr has recommended he get ~64oz. Agreed with Dr and recommended ~64oz of water daily. Spoke about sodium intake and goal of less than 1500mg  daily. Gave some ideas to cut back on sodium. He rarely eats breakfast, spoke with him about the importance of eating something small and nutrient dense in the morning. Reviewed Mediterranean diet handout. Educated on types of fats, sources, and how to read labels. Built out several meals and snacks with foods he likes and will eat.      Intervention Plan   Intervention Prescribe, educate and counsel regarding individualized specific dietary modifications aiming towards targeted core components such as weight, hypertension, lipid management, diabetes, heart failure and other comorbidities.;Nutrition handout(s) given to patient.    Expected Outcomes Short Term Goal: Understand basic principles of dietary content, such as calories, fat, sodium, cholesterol and nutrients.;Short Term Goal: A plan has been developed with personal nutrition goals set during dietitian appointment.;Long Term Goal: Adherence to prescribed nutrition plan.             Nutrition Assessments:  MEDIFICTS Score Key: >=70 Need to make dietary changes  40-70 Heart  Healthy Diet <= 40 Therapeutic Level Cholesterol Diet  Flowsheet Row Cardiac Rehab from 07/31/2023 in [facility] Cardiac and Pulmonary Rehab  Picture Your Plate Total Score on Discharge 80      Picture Your Plate Scores: <84 Unhealthy dietary pattern with much room for improvement. 41-50 Dietary pattern unlikely to meet recommendations for good health and room for improvement. 51-60 More healthful dietary pattern, with some room for improvement.  >60 Healthy dietary pattern, although there may be some specific behaviors that could be improved.    Nutrition Goals Re-Evaluation:  Nutrition Goals Re-Evaluation     Row Name 06/03/23 0932 06/14/23 0941 07/24/23 0948 08/12/23 0944       Goals   Current Weight 231 lb 6.4 oz (105 kg) 233 lb 8 oz (105.9 kg) 238 lb (108 kg) --    Nutrition Goal Eat 15-30gProtein and 30-60gCarbs at each meal. --  Cut back on Carbs Cut back on Carbs    Comment -- Carlis Abbott states that he is doing well with his diet most of the time. He also reports learning a lot about what he is doing right and wrong when he spoke with the RD. He and his wife are also working on limiting their unhealthy snacks. He also continues to work on reducing his sodium intake and does not add salt at the table. He also pays attention to food labels. Leven has been doing well at eating more fruit and potatoes. His problem is intaking to many carbs. He does not eat many sweets or drink sodas. Carlis Abbott reports that he has aquired the knowledge he needs during his time in cardiac rehab to know what he needs to be doing with his nutrition. He continue to work on small changes and progressing towards more heart health changes. He reads food labels, tries to limit sodium and has cut back on unhealth snacks and carbs. He plans to continue to work on these things upon graduation from cardiac rehab.    Expected Outcome -- Short: Continue to reduce sodium intake and read food labels. Long: Continue heart healthy eating  patterns discussed with RD. Short: cut back on carbs. Long: adhere to a diet that pertains to him. Short: graduate from cardiac rehab. Long: adhere to a long terms heart healthy diet.      Personal Goal #2 Re-Evaluation   Personal Goal #2 Reduce saturated fat, less than 12g per day. Replace bad fats for more heart healthy fats. -- -- Reduce saturated fat, less than 12g per day. Replace bad fats for more heart healthy fats.      Personal Goal #3 Re-Evaluation   Personal Goal #3 Read labels and reduce sodium intake to below 2300mg . Ideally 1500mg  per day. -- -- Read labels and reduce sodium intake to below 2300mg . Ideally 1500mg  per day.             Nutrition Goals Discharge (Final Nutrition Goals Re-Evaluation):  Nutrition Goals Re-Evaluation - 08/12/23 0944       Goals   Nutrition Goal Cut back on Carbs    Comment Carlis Abbott reports that he has aquired the knowledge he needs during his time in cardiac rehab to know what he needs to be doing with his nutrition. He continue to work on small changes and progressing towards more heart health changes. He reads food labels, tries to limit sodium and has cut back on unhealth snacks and carbs. He plans to continue to work on these things upon graduation from cardiac rehab.    Expected Outcome Short: graduate from cardiac rehab. Long: adhere to a long terms heart healthy diet.      Personal Goal #2 Re-Evaluation   Personal Goal #2 Reduce saturated fat, less than 12g per day. Replace bad fats for more heart healthy fats.      Personal Goal #3 Re-Evaluation   Personal Goal #3 Read labels and reduce sodium intake to below 2300mg . Ideally 1500mg  per day.             Psychosocial: Target Goals: Acknowledge presence or absence of significant depression and/or stress, maximize coping skills, provide positive support system. Participant is able to verbalize types and ability to use techniques and skills needed for reducing stress and depression.    Education: Stress, Anxiety, and Depression - Group verbal and visual presentation to define topics covered.  Reviews how body is impacted by stress, anxiety, and depression.  Also discusses healthy ways to reduce stress and to treat/manage anxiety and depression.  Written material given at graduation.   Education: Sleep Hygiene -Provides group verbal and written instruction about how sleep can affect your health.  Define sleep hygiene, discuss sleep cycles and impact of sleep habits. Review good sleep hygiene tips.    Initial Review & Psychosocial Screening:  Initial Psych Review & Screening - 05/02/23 1342       Initial Review   Current issues with Current Stress Concerns    Source of Stress Concerns --    Comments Health issues this last year      Family Dynamics   Good Support System? Yes   wife     Barriers   Psychosocial barriers to participate in program There are no identifiable barriers or psychosocial needs.;The patient should benefit from training in stress management and relaxation.      Screening Interventions   Interventions Encouraged to exercise;Provide feedback about the scores to participant;To provide support and resources with identified psychosocial needs    Expected Outcomes Short Term goal: Utilizing psychosocial counselor, staff and physician to assist with identification of specific Stressors or current issues interfering with healing process. Setting desired goal for each stressor or current issue identified.;Long Term Goal: Stressors or current issues are controlled or eliminated.;Short Term goal: Identification and review with participant of any Quality of Life or Depression concerns found by scoring the questionnaire.;Long Term goal: The participant improves quality of Life and PHQ9 Scores as seen by post scores and/or verbalization of changes             Quality of Life Scores:   Quality of Life - 07/31/23 1141       Quality of Life   Select  Quality of Life      Quality of Life Scores   Health/Function Pre 23.47 %    Health/Function Post 27 %    Health/Function % Change 15.04 %    Socioeconomic Pre 23 %    Socioeconomic Post 26.07 %    Socioeconomic % Change  13.35 %    Psych/Spiritual Pre 23.57 %    Psych/Spiritual Post 23.79 %    Psych/Spiritual % Change 0.93 %    Family Pre 22.5 %    Family Post 26.4 %    Family % Change 17.33 %    GLOBAL Pre 23.27 %    GLOBAL Post 26.03 %    GLOBAL % Change 11.86 %            Scores of 19 and below usually indicate a poorer quality of life in these areas.  A difference of  2-3 points is a clinically meaningful difference.  A difference of 2-3 points in the total score of the Quality of Life Index has been associated with significant improvement in overall quality of life, self-image, physical symptoms, and general health in studies assessing change in quality of life.  PHQ-9: Review Flowsheet  More data exists      07/31/2023 05/08/2023 06/19/2022 06/14/2022 06/04/2022  Depression screen PHQ 2/9  Decreased Interest 0 0 0 0 0  Down, Depressed, Hopeless 0 0 0 0 0  PHQ - 2 Score 0 0 0 0 0  Altered sleeping 0 0 0 0 0  Tired, decreased energy 0 1 0 0 0  Change in appetite 0 0 0 0 0  Feeling bad or failure about yourself  0 0 0 0 0  Trouble concentrating 0 0 0 0  0  Moving slowly or fidgety/restless 0 0 0 0 0  Suicidal thoughts 0 0 0 0 0  PHQ-9 Score 0 1 0 0 0  Difficult doing work/chores - Not difficult at all Not difficult at all - Not difficult at all   Interpretation of Total Score  Total Score Depression Severity:  1-4 = Minimal depression, 5-9 = Mild depression, 10-14 = Moderate depression, 15-19 = Moderately severe depression, 20-27 = Severe depression   Psychosocial Evaluation and Intervention:  Psychosocial Evaluation - 05/02/23 1402       Psychosocial Evaluation & Interventions   Interventions Encouraged to exercise with the program and follow exercise  prescription;Stress management education;Relaxation education    Comments 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 He states he is feeling well and has started walking more. 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 He states this last year has been filled with health problems with him and prior to last December he did not have much of a heart history. He states he takes it day by day and just pushes forward. His wife is his main support system. He is interested in the education classes and learning more of how to independently manage his health.    Expected Outcomes Short: attend cardiac rehab for education and exercise. Long; develop and maintain positive self care habits    Continue Psychosocial Services  Follow up required by staff             Psychosocial Re-Evaluation:  Psychosocial Re-Evaluation     Row Name 06/03/23 [id number] 06/14/23 1191 07/24/23 0946 08/12/23 0946       Psychosocial Re-Evaluation   Current issues with None Identified None Identified None Identified None Identified    Comments Carlis Abbott states that the program seems to help him stay positive and manage his mental health. His wife and his fellow gun club members make up his support group. Carlis Abbott reports no major stressors at this time. Carlis Abbott enjoys target shooting 2-3 times a week, reading, walking the dog, and cook for stress relief. He also states that he is sleeping well since his valve replacement. He also reports having a good support system made up by his wife and friends. He believes that exercise in the program has been great for his mental state and wants to continue to exercise outside of rehab. Patient reports no issues with their current mental states, sleep, stress, depression or anxiety. Will follow up with patient in a few weeks for any changes. Patient continues to report no concerns with sleep, stress, or mental  health. He has a good support system and feels like this area of his life is stable.    Expected Outcomes Short: Attend [facility] stress management education to decrease stress. Long: Maintain exercise Post [facility] to keep stress at a minimum. Short: Continue to attend rehab for mental boost. Long: Continue to maintain positive outlook. Short: Continue to exercise regularly to support mental health and notify staff of any changes. Long: maintain mental health and well being through teaching of rehab or prescribed medications independently. Short: graduate from cardiac rehab. Long: maintain good mental health habits and report any concerns or changes to doctor.    Interventions Encouraged to attend Cardiac Rehabilitation for the exercise Encouraged to attend Cardiac Rehabilitation for the exercise Encouraged to attend Cardiac Rehabilitation for the exercise --    Continue  Psychosocial Services  Follow up required by staff Follow up required by staff Follow up required by staff No Follow up required  gradating             Psychosocial Discharge (Final Psychosocial Re-Evaluation):  Psychosocial Re-Evaluation - 08/12/23 0946       Psychosocial Re-Evaluation   Current issues with None Identified    Comments Patient continues to report no concerns with sleep, stress, or mental health. He has a good support system and feels like this area of his life is stable.    Expected Outcomes Short: graduate from cardiac rehab. Long: maintain good mental health habits and report any concerns or changes to doctor.    Continue Psychosocial Services  No Follow up required   gradating            Vocational Rehabilitation: Provide vocational rehab assistance to qualifying candidates.   Vocational Rehab Evaluation & Intervention:  Vocational Rehab - 05/02/23 1339       Initial Vocational Rehab Evaluation & Intervention   Assessment shows need for Vocational Rehabilitation No              Education: Education Goals: Education classes will be provided on a variety of topics geared toward better understanding of heart health and risk factor modification. Participant will state understanding/return demonstration of topics presented as noted by education test scores.  Learning Barriers/Preferences:  Learning Barriers/Preferences - 05/02/23 1338       Learning Barriers/Preferences   Learning Barriers None    Learning Preferences None             General Cardiac Education Topics:  AED/CPR: - Group verbal and written instruction with the use of models to demonstrate the basic use of the AED with the basic ABC's of resuscitation.   Anatomy and Cardiac Procedures: - Group verbal and visual presentation and models provide information about basic cardiac anatomy and function. Reviews the testing methods done to diagnose heart disease and the outcomes of the test results. Describes the treatment choices: Medical Management, Angioplasty, or Coronary Bypass Surgery for treating various heart conditions including Myocardial Infarction, Angina, Valve Disease, and Cardiac Arrhythmias.  Written material given at graduation.   Medication Safety: - Group verbal and visual instruction to review commonly prescribed medications for heart and lung disease. Reviews the medication, class of the drug, and side effects. Includes the steps to properly store meds and maintain the prescription regimen.  Written material given at graduation.   Intimacy: - Group verbal instruction through game format to discuss how heart and lung disease can affect sexual intimacy. Written material given at graduation..   Know Your Numbers and Heart Failure: - Group verbal and visual instruction to discuss disease risk factors for cardiac and pulmonary disease and treatment options.  Reviews associated critical values for Overweight/Obesity, Hypertension, Cholesterol, and Diabetes.  Discusses basics of heart  failure: signs/symptoms and treatments.  Introduces Heart Failure Zone chart for action plan for heart failure.  Written material given at graduation.   Infection Prevention: - Provides verbal and written material to individual with discussion of infection control including proper hand washing and proper equipment cleaning during exercise session. Flowsheet Row Cardiac Rehab from 07/10/2023 in [facility] Cardiac and Pulmonary Rehab  Date 05/08/23  Educator NT  Instruction Review Code 1- Verbalizes Understanding       Falls Prevention: - Provides verbal and written material to individual with discussion of falls prevention and safety. Flowsheet Row Cardiac Rehab from 07/10/2023 in  [facility] Cardiac and Pulmonary Rehab  Date 05/08/23  Educator NT  Instruction Review Code 1- Verbalizes Understanding       Other: -Provides group and verbal instruction on various topics (see comments)   Knowledge Questionnaire Score:  Knowledge Questionnaire Score - 07/31/23 1141       Knowledge Questionnaire Score   Post Score 23/26             Core Components/Risk Factors/Patient Goals at Admission:  Personal Goals and Risk Factors at Admission - 05/02/23 1338       Core Components/Risk Factors/Patient Goals on Admission   Hypertension Yes    Intervention Provide education on lifestyle modifcations including regular physical activity/exercise, weight management, moderate sodium restriction and increased consumption of fresh fruit, vegetables, and low fat dairy, alcohol moderation, and smoking cessation.;Monitor prescription use compliance.    Expected Outcomes Short Term: Continued assessment and intervention until BP is < 140/65mm HG in hypertensive participants. < 130/40mm HG in hypertensive participants with diabetes, heart failure or chronic kidney disease.;Long Term: Maintenance of blood pressure at goal levels.    Lipids Yes    Intervention Provide education and support for participant on  nutrition & aerobic/resistive exercise along with prescribed medications to achieve LDL 70mg , HDL >40mg .    Expected Outcomes Short Term: Participant states understanding of desired cholesterol values and is compliant with medications prescribed. Participant is following exercise prescription and nutrition guidelines.;Long Term: Cholesterol controlled with medications as prescribed, with individualized exercise RX and with personalized nutrition plan. Value goals: LDL < 70mg , HDL > 40 mg.             Education:Diabetes - Individual verbal and written instruction to review signs/symptoms of diabetes, desired ranges of glucose level fasting, after meals and with exercise. Acknowledge that pre and post exercise glucose checks will be done for 3 sessions at entry of program.   Core Components/Risk Factors/Patient Goals Review:   Goals and Risk Factor Review     Row Name 06/03/23 0933 06/14/23 0945 07/24/23 0951 08/12/23 0948       Core Components/Risk Factors/Patient Goals Review   Personal Goals Review Hypertension Hypertension;Weight Management/Obesity Hypertension;Weight Management/Obesity Lipids;Hypertension    Review Carlis Abbott says that he has a blood pressure cuff at home but states that he is not currently taking his pressure at home. He is aware that he should be taking it and has been advised by his doctors. Carlis Abbott continues to work towards weight loss. He weighed in today at 233 lb 8 oz and has a long term goal of getting down to 200 lbs. Carlis Abbott also states that he has been dealing with high blood pressure for most of his life and is working on reducing sodium intake and not adding salt at the table. He does own a BP cuff at home, but has not been checking it routinely since it has been monitored in rehab. He plans to get back to checking his BP multiple times a week. Chai wants to get his weight back down below 220lbs. He does not eat any fried foods. Informed him that most of the time we  intake to many calories. He is going to try to lose a few pounds in the next few weeks. Carlis Abbott is graduting from cardiac rehab today. He plans to continue to take all his medications and check his BP to help control cardiac risk factors. He will follow up with his doctor if there are any concerns or changes.    Expected Outcomes Short:  Start taking blood pressure at home. Long: Monitor blood pressure in order to manage hypertension. Short: Continue to work towards weight goal through diet and exercise. Long: Continue to manage lifestyle risk factors. Short: lose a few pounds in the next few weeks. Long: reach weight goal. Short: graduate from cardiac rehab. Long: continue to control blood pressure and cholesterol with medication and lifestyle changes, and report any concerns to doctor.             Core Components/Risk Factors/Patient Goals at Discharge (Final Review):   Goals and Risk Factor Review - 08/12/23 0948       Core Components/Risk Factors/Patient Goals Review   Personal Goals Review Lipids;Hypertension    Review Carlis Abbott is graduting from cardiac rehab today. He plans to continue to take all his medications and check his BP to help control cardiac risk factors. He will follow up with his doctor if there are any concerns or changes.    Expected Outcomes Short: graduate from cardiac rehab. Long: continue to control blood pressure and cholesterol with medication and lifestyle changes, and report any concerns to doctor.             ITP Comments:  ITP Comments     Row Name 05/02/23 1347 05/08/23 1047 05/27/23 0928 06/05/23 1139 07/03/23 1242   ITP Comments Initial phone call completed. Diagnosis can be found in [facility] 10/15. EP Orientation scheduled for Wednesday 11/20 at 9:30. Completed and gym orientation. Initial ITP created and sent for review to Dr. Bethann Punches, Medical Director. First full day of exercise!  Patient was oriented to gym and equipment including functions, settings,  policies, and procedures.  Patient's individual exercise prescription and treatment plan were reviewed.  All starting workloads were established based on the results of the 6 minute walk test done at initial orientation visit.  The plan for exercise progression was also introduced and progression will be customized based on patient's performance and goals. 30 Day review completed. Medical Director ITP review done, changes made as directed, and signed approval by Medical Director.    new to program 30 Day review completed. Medical Director ITP review done, changes made as directed, and signed approval by Medical Director.    Row Name 07/31/23 [phone] 08/12/23 0938         ITP Comments 30 Day review completed. Medical Director ITP review done, changes made as directed, and signed approval by Medical Director. Jacari graduated today from  rehab with 36 sessions completed.  Details of the patient's exercise prescription and what He needs to do in order to continue the prescription and progress were discussed with patient.  Patient was given a copy of prescription and goals.  Patient verbalized understanding. Thorvald plans to continue to exercise by walking and looking into joining a fitness center.               Comments: Discharge ITP

## 2023-08-12 NOTE — Progress Notes (Signed)
 Daily Session Note  Patient Details  Name: IVOR KISHI [name]: [id number] Date of Birth: 1950-11-29 Referring Provider:   Flowsheet Row Cardiac Rehab from 05/08/2023 in [facility] Cardiac [facility] Pulmonary Rehab  Referring Provider Dr. Dorothyann Peng, MD       Encounter Date: 08/12/2023  Check In:  Session Check In - 08/12/23 0935       Check-In   Supervising physician immediately available to respond to emergencies See telemetry face sheet for immediately available ER MD    Location [facility]-Cardiac & Pulmonary Rehab    Staff Present Rory Percy, MS, Exercise Physiologist;Maxon Suzzette Righter, Exercise Physiologist;Kelly Cloretta Ned, ACSM CEP, Exercise Physiologist;Sun Kihn, RN, BSN, CCRP    Virtual Visit No    Medication changes reported     No    Fall or balance concerns reported    No    Warm-up and Cool-down Performed on first and last piece of equipment    Resistance Training Performed Yes    VAD Patient? No    PAD/SET Patient? No      Pain Assessment   Currently in Pain? No/denies                Social History   Tobacco Use  Smoking Status Former   Current packs/day: 0.00   Average packs/day: 0.5 packs/day for 5.0 years (2.5 ttl pk-yrs)   Types: Cigarettes   Start date: 10/02/1965   Quit date: 10/03/1970   Years since quitting: 52.8  Smokeless Tobacco Never  Tobacco Comments   Quit 1972    Goals Met:  Independence with exercise equipment Exercise tolerated well Personal goals reviewed No report of concerns or symptoms today  Goals Unmet:  Not Applicable  Comments:  Advay graduated today from  rehab with 36 sessions completed.  Details of the patient's exercise prescription and what He needs to do in order to continue the prescription and progress were discussed with patient.  Patient was given a copy of prescription and goals.  Patient verbalized understanding. Ezri plans to continue to exercise by walking and looking into joining a fitness center.   Dr.  Bethann Punches is Medical Director for [facility] Cardiac Rehabilitation.  Dr. Vida Rigger is Medical Director for [facility] Pulmonary Rehabilitation.

## 2023-08-12 NOTE — Progress Notes (Signed)
 Discharge Note for  Alan Campbell     04-May-1951        Alan Campbell graduated today from rehab with 36 sessions completed. Details of the patient's exercise prescription and what He needs to do in order to continue the prescription and progress were discussed with patient. Patient was given a copy of prescription and goals. Patient verbalized understanding. [name] plans to continue to exercise by walking and looking into joining  [employer]    6 Minute Walk     Row Name 05/08/23 1047 07/29/23 0937       6 Minute Walk   Phase Initial Discharge    Distance 905 feet 1280 feet    Distance % Change -- 41.44 %    Distance Feet Change -- 375 ft    Walk Time 6 minutes 6 minutes    # of Rest Breaks 0 0    MPH 1.71 2.42    METS 1.7 2.34    RPE 10 13    Perceived Dyspnea  0 0    VO2 Peak 5.96 8.19    Symptoms No No    Resting HR 73 bpm 74 bpm    Resting BP 104/62 100/62    Resting Oxygen Saturation  98 % 98 %    Exercise Oxygen Saturation  during 6 min walk 100 % 95 %    Max Ex. HR 95 bpm 100 bpm    Max Ex. BP 132/70 124/62    2 Minute Post BP 110/68 112/62

## 2024-02-12 ENCOUNTER — Other Ambulatory Visit: Payer: Self-pay | Admitting: Physician Assistant

## 2024-02-12 DIAGNOSIS — N63 Unspecified lump in unspecified breast: Secondary | ICD-10-CM

## 2024-02-12 DIAGNOSIS — N62 Hypertrophy of breast: Secondary | ICD-10-CM

## 2024-02-12 DIAGNOSIS — Z1231 Encounter for screening mammogram for malignant neoplasm of breast: Secondary | ICD-10-CM

## 2024-02-13 ENCOUNTER — Inpatient Hospital Stay
Admission: RE | Admit: 2024-02-13 | Discharge: 2024-02-13 | Discharge status: Home / Self Care | Source: Ambulatory Visit | Attending: Physician Assistant

## 2024-02-13 ENCOUNTER — Ambulatory Visit
Admission: RE | Admit: 2024-02-13 | Discharge: 2024-02-13 | Disposition: A | Discharge status: Home / Self Care | Source: Ambulatory Visit | Attending: Physician Assistant | Admitting: Physician Assistant

## 2024-02-13 DIAGNOSIS — N63 Unspecified lump in unspecified breast: Secondary | ICD-10-CM | POA: Diagnosis present

## 2024-02-13 DIAGNOSIS — Z1231 Encounter for screening mammogram for malignant neoplasm of breast: Secondary | ICD-10-CM

## 2024-02-13 DIAGNOSIS — N62 Hypertrophy of breast: Secondary | ICD-10-CM

## 2024-03-05 ENCOUNTER — Ambulatory Visit: Payer: Medicare Other | Admitting: Dermatology

## 2024-03-16 ENCOUNTER — Ambulatory Visit (INDEPENDENT_AMBULATORY_CARE_PROVIDER_SITE_OTHER): Admitting: Dermatology

## 2024-03-16 DIAGNOSIS — Z79899 Other long term (current) drug therapy: Secondary | ICD-10-CM

## 2024-03-16 DIAGNOSIS — W908XXA Exposure to other nonionizing radiation, initial encounter: Secondary | ICD-10-CM

## 2024-03-16 DIAGNOSIS — Z1283 Encounter for screening for malignant neoplasm of skin: Secondary | ICD-10-CM | POA: Diagnosis not present

## 2024-03-16 DIAGNOSIS — L578 Other skin changes due to chronic exposure to nonionizing radiation: Secondary | ICD-10-CM

## 2024-03-16 DIAGNOSIS — Z7189 Other specified counseling: Secondary | ICD-10-CM

## 2024-03-16 DIAGNOSIS — L821 Other seborrheic keratosis: Secondary | ICD-10-CM

## 2024-03-16 DIAGNOSIS — L905 Scar conditions and fibrosis of skin: Secondary | ICD-10-CM

## 2024-03-16 DIAGNOSIS — L813 Cafe au lait spots: Secondary | ICD-10-CM

## 2024-03-16 DIAGNOSIS — D692 Other nonthrombocytopenic purpura: Secondary | ICD-10-CM

## 2024-03-16 DIAGNOSIS — D1801 Hemangioma of skin and subcutaneous tissue: Secondary | ICD-10-CM

## 2024-03-16 DIAGNOSIS — L304 Erythema intertrigo: Secondary | ICD-10-CM

## 2024-03-16 DIAGNOSIS — Z86018 Personal history of other benign neoplasm: Secondary | ICD-10-CM

## 2024-03-16 DIAGNOSIS — L814 Other melanin hyperpigmentation: Secondary | ICD-10-CM

## 2024-03-16 DIAGNOSIS — L719 Rosacea, unspecified: Secondary | ICD-10-CM

## 2024-03-16 NOTE — Progress Notes (Unsigned)
 Follow-Up Visit   Subjective  Alan Campbell is a 73 y.o. male who presents for the following: Skin Cancer Screening and Full Body Skin Exam Hx of dysplastic nevus  The patient presents for Total-Body Skin Exam (TBSE) for skin cancer screening and mole check. The patient has spots, moles and lesions to be evaluated, some may be new or changing and the patient may have concern these could be cancer.  The following portions of the chart were reviewed this encounter and updated as appropriate: medications, allergies, medical history  Review of Systems:  No other skin or systemic complaints except as noted in HPI or Assessment and Plan.  Objective  Well appearing patient in no apparent distress; mood and affect are within normal limits.  A full examination was performed including scalp, head, eyes, ears, nose, lips, neck, chest, axillae, abdomen, back, buttocks, bilateral upper extremities, bilateral lower extremities, hands, feet, fingers, toes, fingernails, and toenails. All findings within normal limits unless otherwise noted below.   Relevant physical exam findings are noted in the Assessment and Plan.    Assessment & Plan   SKIN CANCER SCREENING PERFORMED TODAY.  ACTINIC DAMAGE - Chronic condition, secondary to cumulative UV/sun exposure - diffuse scaly erythematous macules with underlying dyspigmentation - Recommend daily broad spectrum sunscreen SPF 30+ to sun-exposed areas, reapply every 2 hours as needed.  - Staying in the shade or wearing long sleeves, sun glasses (UVA+UVB protection) and wide brim hats (4-inch brim around the entire circumference of the hat) are also recommended for sun protection.  - Call for new or changing lesions.  LENTIGINES, SEBORRHEIC KERATOSES, HEMANGIOMAS - Benign normal skin lesions - Benign-appearing - Call for any changes  MELANOCYTIC NEVI - Tan-brown and/or pink-flesh-colored symmetric macules and papules - Benign appearing on exam today -  Observation - Call clinic for new or changing moles - Recommend daily use of broad spectrum spf 30+ sunscreen to sun-exposed areas.   Cafe au lait spots Left Medial Thigh Birthmarks Benign, observe.   Purpura - Chronic; persistent and recurrent.  Treatable, but not curable. 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 Condition is improving with treatment but not currently at goal. Intertrigo is a chronic recurrent rash that occurs in skin fold areas that may be associated with friction; heat; moisture; yeast; fungus; and bacteria.  It is exacerbated by increased movement / activity; sweating; and higher atmospheric temperature.  Use of an absorbant powder such as Zeasorb AF powder or other OTC antifungal powder to the area daily can prevent rash recurrence. Other options to help keep the area dry include blow drying the area after bathing or using antiperspirant products such as Duradry sweat minimizing gel. Treatment Plan: Continue as needed for flares  Skin Medicinals Iodoquinol 1%, Hydrocortisone 2.5%, Niacinamide 2% Cream twice a day to affected areas for up to two weeks.     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 Condition is symptomatic/ bothersome to patient. Not currently at goal. Rosacea is a chronic progressive skin condition usually affecting the face of adults, causing redness  and/or acne bumps. It is treatable but not curable. It sometimes affects the eyes (ocular rosacea) as well. It may respond  to topical and/or systemic medication and can flare with stress, sun exposure, alcohol, exercise, topical steroids (including hydrocortisone/cortisone 10) and some foods.  Daily application of broad spectrum spf 30+ sunscreen to face is recommended to reduce flares.  Patient denies grittiness of the eyes  Treatment Plan Patient defers topical treatment   Counseling for BBL / IPL / Laser and Coordination of Care Discussed the treatment option of Broad Band Light (BBL) /Intense Pulsed Light (IPL)/ Laser for skin discoloration, including brown spots and redness.  Typically we recommend at least 1-3 treatment sessions about 5-8 weeks apart for best results.  Cannot have tanned skin when BBL performed, and regular use of sunscreen/photoprotection is advised after the procedure to help maintain results. The patient's condition may also require maintenance treatments in the future.  The fee for BBL / laser treatments is $350 per treatment session for the whole face.  A fee can be quoted for other parts of the body.  Insurance typically does not pay for BBL/laser treatments and therefore the fee is an out-of-pocket cost. Recommend prophylactic valtrex treatment. Once scheduled for procedure, will send Rx in prior to patient's appointment.   SCAR Exam: Dyspigmented smooth macule or patch at left upper back from previous lipoma surgery by Dr. Desiderio Shanks.  Observation.  Call clinic for new or changing lesions. Recommend daily broad spectrum sunscreen SPF 30+, reapply every 2 hours as needed. Treatment: Recommend Serica moisturizing scar formula cream every night or [facility] brand or Mederma silicone scar sheet every night for the first year after a scar appears to help with scar remodeling if desired. Scars remodel on their own for a full year and will gradually improve in appearance over time.  HISTORY OF DYSPLASTIC NEVUS Left low back 2011 No evidence of recurrence today Recommend regular full  body skin exams Recommend daily broad spectrum sunscreen SPF 30+ to sun-exposed areas, reapply every 2 hours as needed.  Call if any new or changing lesions are noted between office visits    Return in about 1 year (around 03/16/2025) for TBSE.  IEleanor Blush, CMA, am acting as scribe for Alm Rhyme, MD.   Documentation: I have reviewed the above documentation for accuracy and completeness, and I agree with the above.  Alm Rhyme, MD

## 2024-03-16 NOTE — Patient Instructions (Signed)

## 2024-03-18 ENCOUNTER — Encounter: Payer: Self-pay | Admitting: Dermatology

## 2025-03-22 ENCOUNTER — Ambulatory Visit: Admitting: Dermatology
